# Patient Record
Sex: Female | Born: 1935 | Race: White | Hispanic: No | Smoking: Never smoker
Health system: Southern US, Community
[De-identification: ages and names within clinical notes are randomized; demographics above are authoritative.]

## PROBLEM LIST (undated history)

## (undated) DIAGNOSIS — M810 Age-related osteoporosis without current pathological fracture: Secondary | ICD-10-CM

## (undated) DIAGNOSIS — I10 Essential (primary) hypertension: Secondary | ICD-10-CM

## (undated) DIAGNOSIS — S82009A Unspecified fracture of unspecified patella, initial encounter for closed fracture: Secondary | ICD-10-CM

## (undated) DIAGNOSIS — I7300 Raynaud's syndrome without gangrene: Secondary | ICD-10-CM

## (undated) DIAGNOSIS — I714 Abdominal aortic aneurysm, without rupture, unspecified: Secondary | ICD-10-CM

## (undated) DIAGNOSIS — M1990 Unspecified osteoarthritis, unspecified site: Secondary | ICD-10-CM

## (undated) DIAGNOSIS — M419 Scoliosis, unspecified: Secondary | ICD-10-CM

## (undated) HISTORY — DX: Abdominal aortic aneurysm, without rupture: I71.4

## (undated) HISTORY — DX: Abdominal aortic aneurysm, without rupture, unspecified: I71.40

## (undated) HISTORY — PX: BREAST BIOPSY: SHX20

## (undated) HISTORY — PX: APPENDECTOMY: SHX54

---

## 2012-08-10 ENCOUNTER — Emergency Department (HOSPITAL_COMMUNITY): Payer: Medicare Other

## 2012-08-10 ENCOUNTER — Encounter (HOSPITAL_COMMUNITY): Payer: Self-pay | Admitting: Emergency Medicine

## 2012-08-10 ENCOUNTER — Emergency Department (HOSPITAL_COMMUNITY)
Admission: EM | Admit: 2012-08-10 | Discharge: 2012-08-10 | Disposition: A | Discharge status: Nursing Home (Includes ICF) | Payer: Medicare Other | Source: Home / Self Care

## 2012-08-10 ENCOUNTER — Emergency Department (HOSPITAL_COMMUNITY)
Admission: EM | Admit: 2012-08-10 | Discharge: 2012-08-10 | Disposition: A | Discharge status: Home / Self Care | Payer: Medicare Other | Attending: Emergency Medicine | Admitting: Emergency Medicine

## 2012-08-10 DIAGNOSIS — H532 Diplopia: Secondary | ICD-10-CM

## 2012-08-10 DIAGNOSIS — R42 Dizziness and giddiness: Secondary | ICD-10-CM

## 2012-08-10 DIAGNOSIS — R51 Headache: Secondary | ICD-10-CM

## 2012-08-10 DIAGNOSIS — H538 Other visual disturbances: Secondary | ICD-10-CM

## 2012-08-10 DIAGNOSIS — R519 Headache, unspecified: Secondary | ICD-10-CM

## 2012-08-10 DIAGNOSIS — R110 Nausea: Secondary | ICD-10-CM | POA: Insufficient documentation

## 2012-08-10 DIAGNOSIS — Z8739 Personal history of other diseases of the musculoskeletal system and connective tissue: Secondary | ICD-10-CM | POA: Insufficient documentation

## 2012-08-10 HISTORY — DX: Scoliosis, unspecified: M41.9

## 2012-08-10 HISTORY — DX: Raynaud's syndrome without gangrene: I73.00

## 2012-08-10 HISTORY — DX: Age-related osteoporosis without current pathological fracture: M81.0

## 2012-08-10 LAB — DIFFERENTIAL
Eosinophils Absolute: 0.1 10*3/uL (ref 0.0–0.7)
Lymphs Abs: 2.1 10*3/uL (ref 0.7–4.0)
Monocytes Absolute: 0.7 10*3/uL (ref 0.1–1.0)
Neutrophils Relative %: 54 % (ref 43–77)

## 2012-08-10 LAB — CBC
MCH: 32.9 pg (ref 26.0–34.0)
MCHC: 34.5 g/dL (ref 30.0–36.0)
MCV: 95.2 fL (ref 78.0–100.0)
Platelets: 234 10*3/uL (ref 150–400)
RDW: 14.1 % (ref 11.5–15.5)

## 2012-08-10 LAB — GLUCOSE, CAPILLARY: Glucose-Capillary: 59 mg/dL — ABNORMAL LOW (ref 70–99)

## 2012-08-10 LAB — COMPREHENSIVE METABOLIC PANEL
ALT: 13 U/L (ref 0–35)
AST: 24 U/L (ref 0–37)
CO2: 25 mEq/L (ref 19–32)
Calcium: 9.2 mg/dL (ref 8.4–10.5)
Creatinine, Ser: 0.62 mg/dL (ref 0.50–1.10)
GFR calc Af Amer: >90 mL/min (ref >90)
GFR calc non Af Amer: 86 mL/min — ABNORMAL LOW (ref >90)
Sodium: 136 mEq/L (ref 135–145)
Total Protein: 7.7 g/dL (ref 6.0–8.3)

## 2012-08-10 LAB — PROTIME-INR
INR: 1.02 (ref 0.00–1.49)
Prothrombin Time: 13.2 seconds (ref 11.6–15.2)

## 2012-08-10 LAB — POCT I-STAT TROPONIN I: Troponin i, poc: 0 ng/mL (ref 0.00–0.08)

## 2012-08-10 MED ORDER — ASPIRIN EC 325 MG PO TBEC: 325.0000 mg | DELAYED_RELEASE_TABLET | Freq: Every day | ORAL | Status: DC

## 2012-08-10 MED ORDER — ASPIRIN EC 81 MG PO TBEC
81.0000 mg | DELAYED_RELEASE_TABLET | Freq: Every day | ORAL | Status: DC
  Filled 2012-08-10: qty 1

## 2012-08-10 NOTE — ED Notes (Signed)
Pt c/o feeling dizzy about 30 minutes ago... Reports she had a HA, double vision, nauseas... Recalls she was shopping w/daughter and was going out to her car when suddenly sxs she started to feel dizzy... Also reports having colds sxs and every morning she wakes up w/clogged ears... States her sxs are clearing up.... Alert w/no signs of acute distress; daughter in the room w/pt.

## 2012-08-10 NOTE — ED Notes (Signed)
Pt c/o double vision and dizziness with nausea at 1300 which resolved; pt now c/o mild HA only; pt sent from [facility] for further eval

## 2012-08-10 NOTE — ED Provider Notes (Signed)
Medical screening examination/treatment/procedure(s) were performed by resident physician or non-physician practitioner and as supervising physician I was immediately available for consultation/collaboration.   Cheyrl Buley DOUGLAS MD.   Zofia Peckinpaugh D Chelci [place], MD 08/10/12 2019 

## 2012-08-10 NOTE — ED Provider Notes (Signed)
Patient had a vague headache today lasting approximately 20 minutes, we by diplopia and feeling of off balance. She is presently asymptomatic. Patient is alert appropriate Glasgow Coma Score 15 results from as well canners 2 through 12 grossly intact. Symptoms consistent with TIA. Patient does not wish 23 hour observation , and she reports her insurance will not pay adequately.. patient advised to take aspirin daily.   Doug Sou, MD 08/11/12 [phone]

## 2012-08-10 NOTE — ED Provider Notes (Signed)
CSN: [id number]     Arrival date & time 08/10/12  1451 History     First MD Initiated Contact with Patient 08/10/12 1617     Chief Complaint  Patient presents with  . Headache  . Dizziness   (Consider location/radiation/quality/duration/timing/severity/associated sxs/prior Treatment) Patient is a 77 y.o. female presenting with neurologic complaint. The history is provided by the patient and a relative.  Neurologic Problem This is a new problem. The current episode started today. The problem occurs rarely. The problem has been resolved. Associated symptoms include headaches and nausea. Pertinent negatives include no abdominal pain, chest pain, chills, congestion, coughing, diaphoresis, fatigue, fever, neck pain, numbness, rash, sore throat or vomiting. Nothing aggravates the symptoms. She has tried nothing for the symptoms. The treatment provided significant relief.    Past Medical History  Diagnosis Date  . Osteoporosis   . Scoliosis    Past Surgical History  Procedure Laterality Date  . Appendectomy      at age 77   History reviewed. No pertinent family history. History  Substance Use Topics  . Smoking status: Never Smoker   . Smokeless tobacco: Not on file  . Alcohol Use: No   OB History   Grav Para Term Preterm Abortions TAB SAB Ect Mult Living                 Review of Systems  Constitutional: Negative for fever, chills, diaphoresis and fatigue.  HENT: Negative for ear pain, congestion, sore throat, facial swelling, mouth sores, trouble swallowing, neck pain and neck stiffness.   Eyes: Positive for visual disturbance.  Respiratory: Negative for apnea, cough, chest tightness, shortness of breath and wheezing.   Cardiovascular: Negative for chest pain, palpitations and leg swelling.  Gastrointestinal: Positive for nausea. Negative for vomiting, abdominal pain, diarrhea and abdominal distention.  Genitourinary: Negative for hematuria, flank pain, vaginal discharge,  difficulty urinating and menstrual problem.  Musculoskeletal: Negative for back pain and gait problem.  Skin: Negative for rash and wound.  Neurological: Positive for headaches. Negative for dizziness, tremors, seizures, syncope, facial asymmetry and numbness.  Psychiatric/Behavioral: Negative.   All other systems reviewed and are negative.    Allergies  Review of patient's allergies indicates no known allergies.  Home Medications   Current Outpatient Rx  Name  Route  Sig  Dispense  Refill  . Multiple Vitamins-Minerals (MULTIVITAMIN WITH MINERALS) tablet   Oral   Take 1 tablet by mouth daily.          BP 142/79  Pulse 65  Temp(Src) 97.9 F (36.6 C) (Oral)  Resp 18  SpO2 100% Physical Exam  Nursing note and vitals reviewed. Constitutional: She is oriented to person, place, and time. She appears well-developed and well-nourished. No distress.  HENT:  Head: Normocephalic and atraumatic.  Right Ear: External ear normal.  Left Ear: External ear normal.  Nose: Nose normal.  Mouth/Throat: Oropharynx is clear and moist. No oropharyngeal exudate.  Eyes: Conjunctivae and EOM are normal. Pupils are equal, round, and reactive to light. Right eye exhibits no discharge. Left eye exhibits no discharge.  Neck: Normal range of motion. Neck supple. No JVD present. No tracheal deviation present. No thyromegaly present.  Cardiovascular: Normal rate, regular rhythm, normal heart sounds and intact distal pulses.  Exam reveals no gallop and no friction rub.   No murmur heard. Pulmonary/Chest: Effort normal and breath sounds normal. No respiratory distress. She has no wheezes. She has no rales. She exhibits no tenderness.  Abdominal: Soft. Bowel  sounds are normal. She exhibits no distension. There is no tenderness. There is no rebound and no guarding.  Musculoskeletal: Normal range of motion.  Lymphadenopathy:    She has no cervical adenopathy.  Neurological: She is alert and oriented to  person, place, and time. No cranial nerve deficit or sensory deficit. Coordination and gait normal. GCS eye subscore is 4. GCS verbal subscore is 5. GCS motor subscore is 6.  Normal finger to nose and heel to shin. NO ataxia with gait.  Skin: Skin is warm. No rash noted. She is not diaphoretic.  Psychiatric: She has a normal mood and affect. Her behavior is normal. Judgment and thought content normal.    ED Course   Procedures (including critical care time)  Labs Reviewed  COMPREHENSIVE METABOLIC PANEL - Abnormal; Notable for the following:    GFR calc non Af Amer 86 (*)    All other components within normal limits  GLUCOSE, CAPILLARY - Abnormal; Notable for the following:    Glucose-Capillary 59 (*)    All other components within normal limits  PROTIME-INR  APTT  CBC  DIFFERENTIAL  TROPONIN I  POCT I-STAT TROPONIN I   No results found. No diagnosis found.  MDM  78 yr old F here with sudden episode of dizziness, nausea and double vision. She describes the dizziness as lightheadedness. The episode lasted about 10 minutes and was followed by a mild 4/10 headache. The patient then went to Urgent care. She had an EKG which showed NSR with some t wave inversions laterally. She is now asymptomatic with a normal CN, motor, sensory, and cerebellar exam detailed above. She has no known medical problems and takes no medications. Concern for possible TIA. Her labs are unremarkable with a normal troponin which was ordered while patient in waiting room. Will MRI patient.  It was recommended that patient be admitted to the hospital for TIA workup after lengthy conversation. Patient declined this and preferred to have continued workup as an outpatient. Here her MRI is normal. Will recommend she follow up with her PCP for carotid US and cardiac [place]  Results for orders placed during the hospital encounter of 08/10/12  PROTIME-INR      Result Value Range   Prothrombin Time 13.2  11.6 - 15.2 seconds    INR 1.02  0.00 - 1.49  APTT      Result Value Range   aPTT 30  24 - 37 seconds  CBC      Result Value Range   WBC 6.6  4.0 - 10.5 K/uL   RBC 4.41  3.87 - 5.11 MIL/uL   Hemoglobin 14.5  12.0 - 15.0 g/dL   HCT 21.3  08.6 - 57.8 %   MCV 95.2  78.0 - 100.0 fL   MCH 32.9  26.0 - 34.0 pg   MCHC 34.5  30.0 - 36.0 g/dL   RDW 46.9  62.9 - 52.8 %   Platelets 234  150 - 400 K/uL  DIFFERENTIAL      Result Value Range   Neutrophils Relative % 54  43 - 77 %   Lymphocytes Relative 32  12 - 46 %   Monocytes Relative 11  3 - 12 %   Eosinophils Relative 2  0 - 5 %   Basophils Relative 1  0 - 1 %   Neutro Abs 3.6  1.7 - 7.7 K/uL   Lymphs Abs 2.1  0.7 - 4.0 K/uL   Monocytes Absolute 0.7  0.1 -  1.0 K/uL   Eosinophils Absolute 0.1  0.0 - 0.7 K/uL   Basophils Absolute 0.1  0.0 - 0.1 K/uL   Smear Review MORPHOLOGY UNREMARKABLE    COMPREHENSIVE METABOLIC PANEL      Result Value Range   Sodium 136  135 - 145 mEq/L   Potassium 4.4  3.5 - 5.1 mEq/L   Chloride 100  96 - 112 mEq/L   CO2 25  19 - 32 mEq/L   Glucose, Bld 93  70 - 99 mg/dL   BUN 18  6 - 23 mg/dL   Creatinine, Ser 1.61  0.50 - 1.10 mg/dL   Calcium 9.2  8.4 - 09.6 mg/dL   Total Protein 7.7  6.0 - 8.3 g/dL   Albumin 3.7  3.5 - 5.2 g/dL   AST 24  0 - 37 U/L   ALT 13  0 - 35 U/L   Alkaline Phosphatase 84  39 - 117 U/L   Total Bilirubin 1.1  0.3 - 1.2 mg/dL   GFR calc non Af Amer 86 (*) >90 mL/min   GFR calc Af Amer >90  >90 mL/min  TROPONIN I      Result Value Range   Troponin I <0.30  <0.30 ng/mL  GLUCOSE, CAPILLARY      Result Value Range   Glucose-Capillary 59 (*) 70 - 99 mg/dL  POCT I-STAT TROPONIN I      Result Value Range   Troponin i, poc 0.00  0.00 - 0.08 ng/mL   Comment 3             CT Head (Brain) Wo Contrast (Final result)  Result time: 08/10/12 17:04:02    Final result by Rad Results In Interface (08/10/12 17:04:02)    Narrative:   *RADIOLOGY REPORT*  Clinical Data: Dizziness, headache  CT HEAD WITHOUT  CONTRAST  Technique: Contiguous axial images were obtained from the base of the skull through the vertex without contrast.  Comparison: None.  Findings: No acute intracranial hemorrhage. No focal mass lesion. No CT evidence of acute infarction. No midline shift or mass effect. No hydrocephalus. Basilar cisterns are patent. Paranasal sinuses and mastoid air cells are clear. Orbits are normal.  IMPRESSION: No acute intracranial findings.   Original Report Authenticated By: Genevive Bi, M.D.     MR Brain Wo Contrast (Final result)  Result time: 08/10/12 19:15:32    Final result by Rad Results In Interface (08/10/12 19:15:32)    Narrative:   *RADIOLOGY REPORT*  Clinical Data: Possible TIA. Double vision and dizziness and nausea. Headache.  MRI HEAD WITHOUT CONTRAST  Technique: Multiplanar, multiecho pulse sequences of the brain and surrounding structures were obtained according to standard protocol without intravenous contrast.  Comparison: CT 08/10/2012  Findings: Negative for acute infarct.  Mild atrophy. Minimal chronic white matter changes are present bilaterally. Brainstem and cerebellum are normal.  Negative for hemorrhage or mass lesion. No shift of the midline structures. Paranasal sinuses are clear.  IMPRESSION: No acute abnormality.   Original Report Authenticated By: Janeece Riggers, M.D.      Case discussed with Dr. Ethelda Chick.  Sherryl Manges, MD 08/10/12 1924  Sherryl Manges, MD 08/10/12 [phone]

## 2012-08-10 NOTE — ED Provider Notes (Signed)
CSN: [id number]     Arrival date & time 08/10/12  1325 History     First MD Initiated Contact with Patient 08/10/12 1407     Chief Complaint  Patient presents with  . Dizziness   (Consider location/radiation/quality/duration/timing/severity/associated sxs/prior Treatment) HPI Comments:  77 year old female was walking to her car this afternoon and suddenly experienced double vision with  one object on top of the other, associated with dizziness, mild nausea without vomiting, headache and problems focusing. She says that some objects appeared foggy; the symptoms have ameliorated or  abated. She also states that her ears feel clogged and has PND. Her only medical history includes osteoporosis scoliosis. She reports that she is in generally good health in the last time she was sick was in 2003 with pneumonia. She is active and walks on almost a daily basis.   Past Medical History  Diagnosis Date  . Osteoporosis   . Scoliosis    Past Surgical History  Procedure Laterality Date  . Appendectomy      at age 88   No family history on file. History  Substance Use Topics  . Smoking status: Never Smoker   . Smokeless tobacco: Not on file  . Alcohol Use: No   OB History   Grav Para Term Preterm Abortions TAB SAB Ect Mult Living                 Review of Systems  Constitutional: Negative for fever, diaphoresis, activity change, appetite change and fatigue.  HENT: Positive for congestion and postnasal drip. Negative for ear pain, sore throat, facial swelling, rhinorrhea, trouble swallowing, neck pain, neck stiffness, sinus pressure and ear discharge.   Eyes: Positive for visual disturbance. Negative for pain and discharge.  Respiratory: Negative for cough, choking, chest tightness, shortness of breath and wheezing.   Cardiovascular: Negative for chest pain, palpitations and leg swelling.  Gastrointestinal: Positive for nausea. Negative for vomiting and abdominal pain.  Endocrine:  Negative.   Genitourinary: Negative.   Musculoskeletal: Negative.   Skin: Negative for rash.  Neurological: Positive for dizziness, weakness, light-headedness and headaches. Negative for tremors, seizures, syncope, facial asymmetry and speech difficulty.  Psychiatric/Behavioral: Negative for behavioral problems, confusion and decreased concentration. The patient is not nervous/anxious.     Allergies  Review of patient's allergies indicates no known allergies.  Home Medications  No current outpatient prescriptions on file. BP 127/75  Pulse 64  Temp(Src) 98.1 F (36.7 C) (Oral)  Resp 16  SpO2 100% Physical Exam  Nursing note and vitals reviewed. Constitutional: She is oriented to person, place, and time. She appears well-developed and well-nourished. No distress.  HENT:  Mouth/Throat: No oropharyngeal exudate.  Right TM with obstructing cerumen. Left TM clear with mild retraction. Oropharynx with minor erythema and cobblestoning.  Eyes: Conjunctivae and EOM are normal. Pupils are equal, round, and reactive to light.  Neck: Normal range of motion. Neck supple. No JVD present. No thyromegaly present.  No carotid bruits.  Cardiovascular: Normal rate, regular rhythm, normal heart sounds and intact distal pulses.   No murmur heard. Pulmonary/Chest: Effort normal and breath sounds normal. No respiratory distress. She has no wheezes. She has no rales.  Abdominal: Soft. There is no tenderness.  Musculoskeletal: She exhibits no edema and no tenderness.  Lymphadenopathy:    She has no cervical adenopathy.  Neurological: She is alert and oriented to person, place, and time. She has normal strength. She displays no tremor. No cranial nerve deficit or sensory deficit. She  exhibits normal muscle tone. She displays no seizure activity. Coordination and gait normal.  Reflex Scores:      Bicep reflexes are 2+ on the right side and 2+ on the left side.      Brachioradialis reflexes are 2+ on the  right side and 2+ on the left side.      Patellar reflexes are 2+ on the right side and 2+ on the left side. Romberg: Sways but maintained station Heel to toe: Larey Seat out of line twice Finger to nose is normal Negative pronator drift.   Skin: Skin is warm and dry. No rash noted. No erythema.  Psychiatric: She has a normal mood and affect.    ED Course   Procedures (including critical care time)  Labs Reviewed - No data to display No results found. 1. Double vision   2. Dizziness   3. Headache   4. Blurred vision, bilateral     MDM  This 77 year old female is been transferred via shuttle to the emergency department for further evaluation of this is incident in which she had double vision one object above the other, associated with dizziness, mild nausea, headache, and problems focusing.   Hayden Rasmussen, NP 08/10/12 1452

## 2012-08-10 NOTE — ED Notes (Signed)
Upon entering the room patient was found sitting in the chair off the monitor eating food that family brought to her. Patient is in no distress and no difficulty swallowing her food.

## 2012-08-11 NOTE — ED Provider Notes (Signed)
I have personally seen and examined the patient.  I have discussed the plan of care with the resident.  I have reviewed the documentation on PMH/FH/Soc. History.  I have reviewed the documentation of the resident and agree.  Doug Sou, MD 08/11/12 [phone]

## 2012-08-14 ENCOUNTER — Ambulatory Visit: Payer: Medicare Other | Admitting: Family

## 2014-03-18 ENCOUNTER — Encounter (HOSPITAL_COMMUNITY): Payer: Self-pay | Admitting: Emergency Medicine

## 2014-03-18 ENCOUNTER — Emergency Department (HOSPITAL_COMMUNITY)
Admission: EM | Admit: 2014-03-18 | Discharge: 2014-03-18 | Disposition: A | Discharge status: Home / Self Care | Payer: Medicare Other | Attending: Emergency Medicine | Admitting: Emergency Medicine

## 2014-03-18 DIAGNOSIS — B349 Viral infection, unspecified: Secondary | ICD-10-CM | POA: Insufficient documentation

## 2014-03-18 DIAGNOSIS — M419 Scoliosis, unspecified: Secondary | ICD-10-CM | POA: Insufficient documentation

## 2014-03-18 DIAGNOSIS — R197 Diarrhea, unspecified: Secondary | ICD-10-CM

## 2014-03-18 DIAGNOSIS — Z8679 Personal history of other diseases of the circulatory system: Secondary | ICD-10-CM | POA: Diagnosis not present

## 2014-03-18 DIAGNOSIS — Z3202 Encounter for pregnancy test, result negative: Secondary | ICD-10-CM | POA: Insufficient documentation

## 2014-03-18 LAB — URINALYSIS, ROUTINE W REFLEX MICROSCOPIC
BILIRUBIN URINE: NEGATIVE
Glucose, UA: NEGATIVE mg/dL
HGB URINE DIPSTICK: NEGATIVE
Ketones, ur: 15 mg/dL — AB
Nitrite: NEGATIVE
Protein, ur: NEGATIVE mg/dL
SPECIFIC GRAVITY, URINE: 1.009 (ref 1.005–1.030)
UROBILINOGEN UA: 0.2 mg/dL (ref 0.0–1.0)
pH: 5.5 (ref 5.0–8.0)

## 2014-03-18 LAB — COMPREHENSIVE METABOLIC PANEL
ALBUMIN: 3.3 g/dL — AB (ref 3.5–5.2)
ALK PHOS: 49 U/L (ref 39–117)
ALT: 21 U/L (ref 0–35)
ANION GAP: 10 (ref 5–15)
AST: 42 U/L — ABNORMAL HIGH (ref 0–37)
BUN: 14 mg/dL (ref 6–23)
CO2: 26 mmol/L (ref 19–32)
CREATININE: 0.9 mg/dL (ref 0.50–1.10)
Calcium: 7.8 mg/dL — ABNORMAL LOW (ref 8.4–10.5)
Chloride: 96 mmol/L (ref 96–112)
GFR, EST AFRICAN AMERICAN: 70 mL/min — AB (ref >90)
GFR, EST NON AFRICAN AMERICAN: 61 mL/min — AB (ref >90)
Glucose, Bld: 139 mg/dL — ABNORMAL HIGH (ref 70–99)
POTASSIUM: 3.2 mmol/L — AB (ref 3.5–5.1)
SODIUM: 132 mmol/L — AB (ref 135–145)
TOTAL PROTEIN: 6.7 g/dL (ref 6.0–8.3)
Total Bilirubin: 1.1 mg/dL (ref 0.3–1.2)

## 2014-03-18 LAB — CBC WITH DIFFERENTIAL/PLATELET
BASOS ABS: 0 10*3/uL (ref 0.0–0.1)
BASOS PCT: 0 % (ref 0–1)
EOS ABS: 0 10*3/uL (ref 0.0–0.7)
Eosinophils Relative: 0 % (ref 0–5)
HCT: 40.4 % (ref 36.0–46.0)
Hemoglobin: 13.2 g/dL (ref 12.0–15.0)
LYMPHS PCT: 15 % (ref 12–46)
Lymphs Abs: 1.1 10*3/uL (ref 0.7–4.0)
MCH: 30.1 pg (ref 26.0–34.0)
MCHC: 32.7 g/dL (ref 30.0–36.0)
MCV: 92.2 fL (ref 78.0–100.0)
MONO ABS: 0.6 10*3/uL (ref 0.1–1.0)
Monocytes Relative: 9 % (ref 3–12)
NEUTROS ABS: 5.5 10*3/uL (ref 1.7–7.7)
Neutrophils Relative %: 76 % (ref 43–77)
PLATELETS: 149 10*3/uL — AB (ref 150–400)
RBC: 4.38 MIL/uL (ref 3.87–5.11)
RDW: 13.8 % (ref 11.5–15.5)
WBC: 7.2 10*3/uL (ref 4.0–10.5)

## 2014-03-18 LAB — POC URINE PREG, ED: PREG TEST UR: NEGATIVE

## 2014-03-18 LAB — URINE MICROSCOPIC-ADD ON

## 2014-03-18 MED ORDER — POTASSIUM CHLORIDE CRYS ER 20 MEQ PO TBCR
40.0000 meq | EXTENDED_RELEASE_TABLET | Freq: Once | ORAL | Status: DC
  Filled 2014-03-18: qty 2

## 2014-03-18 MED ORDER — POTASSIUM CHLORIDE 20 MEQ/15ML (10%) PO SOLN
40.0000 meq | Freq: Once | ORAL | Status: AC
  Administered 2014-03-18: 40 meq via ORAL
  Filled 2014-03-18: qty 30

## 2014-03-18 MED ORDER — SODIUM CHLORIDE 0.9 % IV BOLUS (SEPSIS)
500.0000 mL | Freq: Once | INTRAVENOUS | Status: AC
  Administered 2014-03-18: 500 mL via INTRAVENOUS

## 2014-03-18 NOTE — ED Provider Notes (Signed)
CSN: [id number]     Arrival date & time 03/18/14  1511 History   First MD Initiated Contact with Patient 03/18/14 1638     Chief Complaint  Patient presents with  . Diarrhea     (Consider location/radiation/quality/duration/timing/severity/associated sxs/prior Treatment) HPI  Pt presents with c/o body aches, fever as well as nausea and diarrhea.  She states symptoms began approx 6 days ago.  No vomiting.  No abdominal pain.  Diarrhea is loose but has been decreasing for most of the day today, until she had another episode of loose stool prior to coming to the ED.  She has had decreased po intake due to concern that this will cause more diarrhea.  No urinary symptoms.  No difficulty breathing but did have cough and body aches at the beginning of her illness.  No sick contacts.  No recent travel.  There are no other associated systemic symptoms, there are no other alleviating or modifying factors.   Past Medical History  Diagnosis Date  . Osteoporosis   . Scoliosis   . Raynaud's disease    Past Surgical History  Procedure Laterality Date  . Appendectomy      at age 57   No family history on file. History  Substance Use Topics  . Smoking status: Never Smoker   . Smokeless tobacco: Not on file  . Alcohol Use: No   OB History    No data available     Review of Systems  ROS reviewed and all otherwise negative except for mentioned in HPI    Allergies  Review of patient's allergies indicates no known allergies.  Home Medications   Prior to Admission medications   Not on File   BP 124/45 mmHg  Pulse 66  Temp(Src) 97.9 F (36.6 C)  Resp 16  SpO2 93%  Vitals reviewed Physical Exam  Physical Examination: General appearance - alert, well appearing, and in no distress Mental status - alert, oriented to person, place, and time Eyes - no conjunctival injection, no scleral icterus Mouth - mucous membranes moist, pharynx normal without lesions Chest - clear to auscultation,  no wheezes, rales or rhonchi, symmetric air entry Heart - normal rate, regular rhythm, normal S1, S2, no murmurs, rubs, clicks or gallops Abdomen - soft, nontender, nondistended, no masses or organomegaly, nabs Extremities - peripheral pulses normal, no pedal edema, no clubbing or cyanosis Skin - normal coloration and turgor, no rashes  ED Course  Procedures (including critical care time) Labs Review Labs Reviewed  CBC WITH DIFFERENTIAL/PLATELET - Abnormal; Notable for the following:    Platelets 149 (*)    All other components within normal limits  COMPREHENSIVE METABOLIC PANEL - Abnormal; Notable for the following:    Sodium 132 (*)    Potassium 3.2 (*)    Glucose, Bld 139 (*)    Calcium 7.8 (*)    Albumin 3.3 (*)    AST 42 (*)    GFR calc non Af Amer 61 (*)    GFR calc Af Amer 70 (*)    All other components within normal limits  URINALYSIS, ROUTINE W REFLEX MICROSCOPIC - Abnormal; Notable for the following:    Ketones, ur 15 (*)    Leukocytes, UA TRACE (*)    All other components within normal limits  URINE MICROSCOPIC-ADD ON - Abnormal; Notable for the following:    Squamous Epithelial / LPF FEW (*)    Bacteria, UA FEW (*)    Casts HYALINE CASTS (*)  All other components within normal limits  CLOSTRIDIUM DIFFICILE BY PCR  STOOL CULTURE  STOOL CULTURE  CLOSTRIDIUM DIFFICILE BY PCR  POC URINE PREG, ED    Imaging Review No results found.   EKG Interpretation None      MDM   Final diagnoses:  Diarrhea  Viral illness    Pt presenting with c/o 6 days of diarrhea- intiially she had fever/body aches and cough. Suspect most likely viral illness. No abdominal tenderness.  Urine is reassuring.  Pt received IV fluids and potassium replacement.  She was not able to give a stool sample to check for cdif.  Sent home with specimen container.  Discharged with strict return precautions.  Pt agreeable with plan.    Jerelyn ScottMartha Linker, MD 03/19/14 [phone]

## 2014-03-18 NOTE — ED Notes (Signed)
Pt reports diarrhea x 6 days. sts she has been unable to tolerate PO. Denies abdominal pain or vomiting.

## 2014-03-18 NOTE — Discharge Instructions (Signed)
Return to the ED with any concerns including vomiting and not able to keep down liquids, abdominal pain, fever/chills, blood in stool, decreased level of alertness/lethargy, or any other alarming symptoms

## 2014-03-19 ENCOUNTER — Other Ambulatory Visit (HOSPITAL_COMMUNITY)
Admission: AD | Admit: 2014-03-19 | Discharge: 2014-03-19 | Disposition: A | Discharge status: Home / Self Care | Payer: Medicare Other | Source: Ambulatory Visit | Attending: Emergency Medicine | Admitting: Emergency Medicine

## 2014-03-30 ENCOUNTER — Emergency Department (HOSPITAL_COMMUNITY): Payer: Medicare Other

## 2014-03-30 ENCOUNTER — Encounter (HOSPITAL_COMMUNITY): Payer: Self-pay | Admitting: Emergency Medicine

## 2014-03-30 ENCOUNTER — Inpatient Hospital Stay (HOSPITAL_COMMUNITY)
Admission: EM | Admit: 2014-03-30 | Discharge: 2014-04-13 | DRG: 219 | Disposition: A | Discharge status: Home / Self Care | Payer: Medicare Other | Attending: Surgery | Admitting: Surgery

## 2014-03-30 DIAGNOSIS — I7100 Dissection of unspecified site of aorta: Secondary | ICD-10-CM | POA: Diagnosis not present

## 2014-03-30 DIAGNOSIS — R05 Cough: Secondary | ICD-10-CM

## 2014-03-30 DIAGNOSIS — J189 Pneumonia, unspecified organism: Secondary | ICD-10-CM | POA: Diagnosis present

## 2014-03-30 DIAGNOSIS — J90 Pleural effusion, not elsewhere classified: Secondary | ICD-10-CM

## 2014-03-30 DIAGNOSIS — R402252 Coma scale, best verbal response, oriented, at arrival to emergency department: Secondary | ICD-10-CM | POA: Diagnosis present

## 2014-03-30 DIAGNOSIS — I712 Thoracic aortic aneurysm, without rupture, unspecified: Secondary | ICD-10-CM | POA: Diagnosis present

## 2014-03-30 DIAGNOSIS — R059 Cough, unspecified: Secondary | ICD-10-CM

## 2014-03-30 DIAGNOSIS — M419 Scoliosis, unspecified: Secondary | ICD-10-CM | POA: Diagnosis present

## 2014-03-30 DIAGNOSIS — I7300 Raynaud's syndrome without gangrene: Secondary | ICD-10-CM | POA: Diagnosis present

## 2014-03-30 DIAGNOSIS — N390 Urinary tract infection, site not specified: Secondary | ICD-10-CM | POA: Diagnosis present

## 2014-03-30 DIAGNOSIS — E876 Hypokalemia: Secondary | ICD-10-CM | POA: Diagnosis not present

## 2014-03-30 DIAGNOSIS — I7101 Dissection of thoracic aorta: Secondary | ICD-10-CM | POA: Diagnosis not present

## 2014-03-30 DIAGNOSIS — D62 Acute posthemorrhagic anemia: Secondary | ICD-10-CM | POA: Diagnosis not present

## 2014-03-30 DIAGNOSIS — E440 Moderate protein-calorie malnutrition: Secondary | ICD-10-CM | POA: Insufficient documentation

## 2014-03-30 DIAGNOSIS — R0600 Dyspnea, unspecified: Secondary | ICD-10-CM

## 2014-03-30 DIAGNOSIS — I82431 Acute embolism and thrombosis of right popliteal vein: Secondary | ICD-10-CM | POA: Diagnosis present

## 2014-03-30 DIAGNOSIS — I2510 Atherosclerotic heart disease of native coronary artery without angina pectoris: Secondary | ICD-10-CM | POA: Diagnosis present

## 2014-03-30 DIAGNOSIS — R402362 Coma scale, best motor response, obeys commands, at arrival to emergency department: Secondary | ICD-10-CM | POA: Diagnosis present

## 2014-03-30 DIAGNOSIS — Z9689 Presence of other specified functional implants: Secondary | ICD-10-CM

## 2014-03-30 DIAGNOSIS — J209 Acute bronchitis, unspecified: Secondary | ICD-10-CM | POA: Diagnosis present

## 2014-03-30 DIAGNOSIS — I959 Hypotension, unspecified: Secondary | ICD-10-CM | POA: Diagnosis not present

## 2014-03-30 DIAGNOSIS — I10 Essential (primary) hypertension: Secondary | ICD-10-CM | POA: Diagnosis present

## 2014-03-30 DIAGNOSIS — M810 Age-related osteoporosis without current pathological fracture: Secondary | ICD-10-CM | POA: Diagnosis present

## 2014-03-30 DIAGNOSIS — Z86718 Personal history of other venous thrombosis and embolism: Secondary | ICD-10-CM | POA: Diagnosis not present

## 2014-03-30 DIAGNOSIS — I2699 Other pulmonary embolism without acute cor pulmonale: Secondary | ICD-10-CM | POA: Diagnosis present

## 2014-03-30 DIAGNOSIS — I71012 Dissection of descending thoracic aorta: Secondary | ICD-10-CM | POA: Diagnosis present

## 2014-03-30 DIAGNOSIS — R402142 Coma scale, eyes open, spontaneous, at arrival to emergency department: Secondary | ICD-10-CM | POA: Diagnosis present

## 2014-03-30 LAB — URINE MICROSCOPIC-ADD ON

## 2014-03-30 LAB — URINALYSIS, ROUTINE W REFLEX MICROSCOPIC
Bilirubin Urine: NEGATIVE
GLUCOSE, UA: NEGATIVE mg/dL
Ketones, ur: NEGATIVE mg/dL
Nitrite: POSITIVE — AB
PH: 5 (ref 5.0–8.0)
Protein, ur: 30 mg/dL — AB
Specific Gravity, Urine: 1.023 (ref 1.005–1.030)
UROBILINOGEN UA: 0.2 mg/dL (ref 0.0–1.0)

## 2014-03-30 LAB — INFLUENZA PANEL BY PCR (TYPE A & B)
H1N1 flu by pcr: NOT DETECTED
INFLAPCR: NEGATIVE
INFLBPCR: NEGATIVE

## 2014-03-30 LAB — I-STAT CG4 LACTIC ACID, ED: Lactic Acid, Venous: 1.6 mmol/L (ref 0.5–2.0)

## 2014-03-30 LAB — CBC
HCT: 35.5 % — ABNORMAL LOW (ref 36.0–46.0)
HEMOGLOBIN: 11.3 g/dL — AB (ref 12.0–15.0)
MCH: 30.2 pg (ref 26.0–34.0)
MCHC: 31.8 g/dL (ref 30.0–36.0)
MCV: 94.9 fL (ref 78.0–100.0)
Platelets: 315 10*3/uL (ref 150–400)
RBC: 3.74 MIL/uL — AB (ref 3.87–5.11)
RDW: 14 % (ref 11.5–15.5)
WBC: 12.9 10*3/uL — ABNORMAL HIGH (ref 4.0–10.5)

## 2014-03-30 LAB — HEPATIC FUNCTION PANEL
ALBUMIN: 2.9 g/dL — AB (ref 3.5–5.2)
ALT: 27 U/L (ref 0–35)
AST: 38 U/L — ABNORMAL HIGH (ref 0–37)
Alkaline Phosphatase: 64 U/L (ref 39–117)
BILIRUBIN DIRECT: 0.2 mg/dL (ref 0.0–0.5)
Indirect Bilirubin: 0.6 mg/dL (ref 0.3–0.9)
TOTAL PROTEIN: 7.2 g/dL (ref 6.0–8.3)
Total Bilirubin: 0.8 mg/dL (ref 0.3–1.2)

## 2014-03-30 LAB — BASIC METABOLIC PANEL
Anion gap: 7 (ref 5–15)
BUN: 15 mg/dL (ref 6–23)
CALCIUM: 8.5 mg/dL (ref 8.4–10.5)
CO2: 28 mmol/L (ref 19–32)
Chloride: 104 mmol/L (ref 96–112)
Creatinine, Ser: 0.64 mg/dL (ref 0.50–1.10)
GFR calc Af Amer: >90 mL/min (ref >90)
GFR calc non Af Amer: 85 mL/min — ABNORMAL LOW (ref >90)
Glucose, Bld: 140 mg/dL — ABNORMAL HIGH (ref 70–99)
POTASSIUM: 3.5 mmol/L (ref 3.5–5.1)
Sodium: 139 mmol/L (ref 135–145)

## 2014-03-30 LAB — LACTIC ACID, PLASMA: Lactic Acid, Venous: 1.3 mmol/L (ref 0.5–2.0)

## 2014-03-30 LAB — LIPASE, BLOOD: LIPASE: 26 U/L (ref 11–59)

## 2014-03-30 LAB — MRSA PCR SCREENING: MRSA by PCR: NEGATIVE

## 2014-03-30 LAB — BRAIN NATRIURETIC PEPTIDE: B Natriuretic Peptide: 68.2 pg/mL (ref 0.0–100.0)

## 2014-03-30 LAB — I-STAT TROPONIN, ED
TROPONIN I, POC: 0 ng/mL (ref 0.00–0.08)
TROPONIN I, POC: 0 ng/mL (ref 0.00–0.08)

## 2014-03-30 LAB — PROCALCITONIN: Procalcitonin: <0.1 ng/mL

## 2014-03-30 MED ORDER — MORPHINE SULFATE 2 MG/ML IJ SOLN
2.0000 mg | Freq: Once | INTRAMUSCULAR | Status: AC
  Administered 2014-03-30: 2 mg via INTRAVENOUS
  Filled 2014-03-30: qty 1

## 2014-03-30 MED ORDER — DEXTROSE 5 % IV SOLN
500.0000 mg | Freq: Once | INTRAVENOUS | Status: AC
  Administered 2014-03-30: 500 mg via INTRAVENOUS
  Filled 2014-03-30: qty 500

## 2014-03-30 MED ORDER — HYDRALAZINE HCL 20 MG/ML IJ SOLN
5.0000 mg | INTRAMUSCULAR | Status: DC | PRN
  Administered 2014-03-30 – 2014-03-31 (×2): 5 mg via INTRAVENOUS
  Filled 2014-03-30 (×2): qty 1

## 2014-03-30 MED ORDER — SODIUM CHLORIDE 0.9 % IV BOLUS (SEPSIS)
1000.0000 mL | Freq: Once | INTRAVENOUS | Status: AC
  Administered 2014-03-30: 1000 mL via INTRAVENOUS

## 2014-03-30 MED ORDER — CETYLPYRIDINIUM CHLORIDE 0.05 % MT LIQD
7.0000 mL | Freq: Two times a day (BID) | OROMUCOSAL | Status: DC
  Administered 2014-03-30 – 2014-04-04 (×6): 7 mL via OROMUCOSAL

## 2014-03-30 MED ORDER — SODIUM CHLORIDE 0.9 % IV SOLN
INTRAVENOUS | Status: DC
  Administered 2014-03-30 (×2): via INTRAVENOUS
  Administered 2014-03-31: 100 mL/h via INTRAVENOUS
  Administered 2014-04-03: 01:00:00 via INTRAVENOUS

## 2014-03-30 MED ORDER — DEXTROSE 5 % IV SOLN
1.0000 g | Freq: Once | INTRAVENOUS | Status: AC
  Administered 2014-03-30: 1 g via INTRAVENOUS
  Filled 2014-03-30: qty 10

## 2014-03-30 MED ORDER — MORPHINE SULFATE 4 MG/ML IJ SOLN
4.0000 mg | Freq: Once | INTRAMUSCULAR | Status: AC
  Administered 2014-03-30: 4 mg via INTRAVENOUS
  Filled 2014-03-30: qty 1

## 2014-03-30 MED ORDER — FENTANYL CITRATE 0.05 MG/ML IJ SOLN: 100.0000 ug | Freq: Once | INTRAMUSCULAR | Status: DC

## 2014-03-30 MED ORDER — IPRATROPIUM-ALBUTEROL 0.5-2.5 (3) MG/3ML IN SOLN
3.0000 mL | Freq: Once | RESPIRATORY_TRACT | Status: AC
  Administered 2014-03-30: 3 mL via RESPIRATORY_TRACT
  Filled 2014-03-30: qty 3

## 2014-03-30 MED ORDER — DEXTROSE 5 % IV SOLN
1.0000 g | INTRAVENOUS | Status: DC
  Administered 2014-03-31 – 2014-04-03 (×4): 1 g via INTRAVENOUS
  Filled 2014-03-30 (×6): qty 10

## 2014-03-30 MED ORDER — MORPHINE SULFATE 2 MG/ML IJ SOLN
1.0000 mg | INTRAMUSCULAR | Status: DC | PRN
  Administered 2014-03-30 – 2014-04-01 (×8): 2 mg via INTRAVENOUS
  Filled 2014-03-30 (×9): qty 1

## 2014-03-30 MED ORDER — ONDANSETRON HCL 4 MG/2ML IJ SOLN
4.0000 mg | Freq: Once | INTRAMUSCULAR | Status: AC
  Administered 2014-03-30: 4 mg via INTRAVENOUS
  Filled 2014-03-30: qty 2

## 2014-03-30 MED ORDER — ONDANSETRON HCL 4 MG/2ML IJ SOLN
4.0000 mg | Freq: Four times a day (QID) | INTRAMUSCULAR | Status: DC | PRN
  Administered 2014-03-30 – 2014-04-04 (×12): 4 mg via INTRAVENOUS
  Filled 2014-03-30 (×12): qty 2

## 2014-03-30 MED ORDER — FENTANYL CITRATE 0.05 MG/ML IJ SOLN
50.0000 ug | Freq: Once | INTRAMUSCULAR | Status: AC
  Administered 2014-03-30: 50 ug via INTRAVENOUS
  Filled 2014-03-30: qty 2

## 2014-03-30 MED ORDER — PANTOPRAZOLE SODIUM 40 MG PO TBEC
40.0000 mg | DELAYED_RELEASE_TABLET | Freq: Every day | ORAL | Status: DC
Start: 2014-03-30 — End: 2014-04-04
  Administered 2014-03-30 – 2014-04-03 (×4): 40 mg via ORAL
  Filled 2014-03-30 (×5): qty 1

## 2014-03-30 MED ORDER — ACETAMINOPHEN 325 MG PO TABS: 650.0000 mg | ORAL_TABLET | ORAL | Status: DC | PRN

## 2014-03-30 MED ORDER — SODIUM CHLORIDE 0.9 % IV SOLN: 250.0000 mL | INTRAVENOUS | Status: DC | PRN

## 2014-03-30 NOTE — ED Notes (Signed)
C/o generalized back pain x 1 week.  Reports bilateral jaw pain that started during the night.  Denies nausea, vomiting.  Reports difficulty taking a deep breath.

## 2014-03-30 NOTE — ED Provider Notes (Signed)
CSN: [id number]     Arrival date & time 03/30/14  [id number] History   First MD Initiated Contact with Patient 03/30/14 0801     Chief Complaint  Patient presents with  . Back Pain  . Jaw Pain     (Consider location/radiation/quality/duration/timing/severity/associated sxs/prior Treatment) HPI The patient has been ill for approximately 3 weeks. At onset she had a diarrheal illness with coughing associated. At that time she had had fever with a MAXIMUM TEMPERATURE that remained under 101. The diarrhea had been copious and watery. She had been seen in the emergency department and diagnosed with a viral illness. The patient began taking Kaopectate and the diarrheal frequency improved significantly. Her cough has improved somewhat although she continues to have wet frequent cough and has shortness of breath and pain with deep inspiration. The patient reports pain has increased significantly with extensive lower back pain as well as generalized aching. She reports she feels extremely fatigued and ill. She has not developed vomiting and has tried to keep up with fluid intake by drinking water. Past Medical History  Diagnosis Date  . Osteoporosis   . Scoliosis   . Raynaud's disease    Past Surgical History  Procedure Laterality Date  . Appendectomy      at age 79   No family history on file. History  Substance Use Topics  . Smoking status: Never Smoker   . Smokeless tobacco: Not on file  . Alcohol Use: No   OB History    No data available     Review of Systems  10 Systems reviewed and are negative for acute change except as noted in the HPI.   Allergies  Review of patient's allergies indicates no known allergies.  Home Medications   Prior to Admission medications   Medication Sig Start Date End Date Taking? Authorizing Provider  acetaminophen (TYLENOL) 325 MG tablet Take 650 mg by mouth every 6 (six) hours as needed.   Yes Historical Provider, MD   BP 101/80 mmHg  Pulse 72   Temp(Src) 97.5 F (36.4 C) (Oral)  Resp 24  Ht 5\' 2"  (1.575 m)  Wt 121 lb (54.885 kg)  BMI 22.13 kg/m2  SpO2 96% Physical Exam  Constitutional: She is oriented to person, place, and time.  The patient is a well-nourished well-developed female who is ill in appearance and appears to be in general pain. She does not have respiratory distress. Her mental status is appropriate in giving history.  HENT:  Head: Normocephalic and atraumatic.  Right Ear: External ear normal.  Left Ear: External ear normal.  Nose: Nose normal.  Mucous membranes are slightly dry. Posterior oropharynx is widely patent.  Eyes: EOM are normal. Pupils are equal, round, and reactive to light. Right eye exhibits no discharge. Left eye exhibits no discharge.  Neck: Neck supple.  Cardiovascular: Normal rate, regular rhythm, normal heart sounds and intact distal pulses.   Pulmonary/Chest: Effort normal.  The patient has frequent wet sounding cough. There is decreased air flow to the bases. With deeper inspiration the patient develops coughing.  Abdominal: Soft. She exhibits no distension. There is tenderness.  Abdomen is diffusely tender without guarding. She cannot localize pain.  Musculoskeletal: Normal range of motion. She exhibits no edema.  Patient endorses significant pain to palpation at the 8 level of the sacrum and lower back without localizing to the vertebral bodies.  Neurological: She is alert and oriented to person, place, and time. She has normal strength. No cranial nerve deficit.  She exhibits normal muscle tone. Coordination normal. GCS eye subscore is 4. GCS verbal subscore is 5. GCS motor subscore is 6.  Skin: Skin is warm, dry and intact. No rash noted.  Psychiatric:  The patient is appropriate but ill in appearance.    ED Course  Procedures (including critical care time) Labs Review Labs Reviewed  CBC - Abnormal; Notable for the following:    WBC 12.9 (*)    RBC 3.74 (*)    Hemoglobin 11.3 (*)     HCT 35.5 (*)    All other components within normal limits  BASIC METABOLIC PANEL - Abnormal; Notable for the following:    Glucose, Bld 140 (*)    GFR calc non Af Amer 85 (*)    All other components within normal limits  HEPATIC FUNCTION PANEL - Abnormal; Notable for the following:    Albumin 2.9 (*)    AST 38 (*)    All other components within normal limits  URINALYSIS, ROUTINE W REFLEX MICROSCOPIC - Abnormal; Notable for the following:    Color, Urine AMBER (*)    APPearance TURBID (*)    Hgb urine dipstick MODERATE (*)    Protein, ur 30 (*)    Nitrite POSITIVE (*)    Leukocytes, UA LARGE (*)    All other components within normal limits  URINE MICROSCOPIC-ADD ON - Abnormal; Notable for the following:    Squamous Epithelial / LPF FEW (*)    Bacteria, UA MANY (*)    All other components within normal limits  CULTURE, BLOOD (ROUTINE X 2)  CULTURE, BLOOD (ROUTINE X 2)  BRAIN NATRIURETIC PEPTIDE  LIPASE, BLOOD  INFLUENZA PANEL BY PCR (TYPE A & B, H1N1)  I-STAT TROPOININ, ED  I-STAT CG4 LACTIC ACID, ED  I-STAT TROPOININ, ED    Imaging Review Ct Abdomen Pelvis Wo Contrast  03/30/2014   CLINICAL DATA:  Back pain and shortness of Breath  EXAM: CT CHEST, ABDOMEN AND PELVIS WITHOUT CONTRAST  TECHNIQUE: Multidetector CT imaging of the chest, abdomen and pelvis was performed following the standard protocol without IV contrast. This study was originally ordered with contrast although the patient adamantly refuses to be administered the contrast  COMPARISON:  None.  FINDINGS: CT CHEST FINDINGS  The lungs are well aerated bilaterally. No focal infiltrate or sizable effusion is seen. Mild fibrotic changes are noted in the bases bilaterally. No parenchymal nodules are seen. Thoracic inlet demonstrates the right internal jugular vein to be enlarged of uncertain significance. Calcifications of the thoracic aorta in its branches are noted. Additionally there are changes consistent with dissection  of the descending thoracic aorta although the chronicity of this is uncertain given the lack of contrast. This appears to extend to the level of the aortic hiatus. Some increased attenuation is noted within the false lumen which may be related to acute changes. Given the patient's clinical history of acute back pain this must be viewed with some suspicion for an acute dissection.  No hilar or mediastinal adenopathy is seen. Coronary calcifications are noted. The osseous structures of the chest show degenerative change of the thoracic spine. No other focal abnormality is noted.  CT ABDOMEN AND PELVIS FINDINGS  The liver, gallbladder, spleen, adrenal glands and pancreas are within normal limits. The kidneys are well visualized without renal calculi or obstructive changes. Some scarring is noted in the midportion of the left kidney.  Diffuse aortoiliac calcifications are noted. Mild aneurysmal dilatation of the aorta is seen to 3.1 cm.  Ectasia of the common iliac arteries is noted bilaterally. There are no findings to suggest dissection within the abdominal aorta. Scattered diverticular change of the colon is seen. The appendix is not well visualized consistent with the surgical history. The bladder is partially distended. No pelvic mass lesion is seen. No significant lymphadenopathy is noted. The osseous structures of the abdomen demonstrate a scoliosis concave to the left of the lumbar spine. Mild degenerative changes are seen. No acute bony abnormality is noted.  IMPRESSION: CT of the chest: Changes consistent with descending thoracic aortic dissection of uncertain chronicity. Increased attenuation is noted within the false lumen which may represent some acute change. Given the patient's clinical history this must be viewed as an acute to subacute dissection. The patient should be encouraged to undergo contrast-enhanced CTA.  Bibasilar fibrotic changes.  CT of the abdomen and pelvis:  No acute abnormality is noted.   Mild aneurysmal dilatation of the abdominal aorta.  Critical Value/emergent results were called by telephone at the time of interpretation on 03/30/2014 at 10:46 am to Dr. Arby Barrette , who verbally acknowledged these results.   Electronically Signed   By: Alcide Clever M.D.   On: 03/30/2014 10:48   Dg Chest 2 View  03/30/2014   CLINICAL DATA:  Dyspnea with cough for 2 weeks.  EXAM: CHEST  2 VIEW  COMPARISON:  None.  FINDINGS: There is hyperinflation. There is moderate cardiomegaly and aortic tortuosity. Basilar airspace opacities are present bilaterally without confluent consolidation. This could be infectious or atelectatic. No effusions are evident.  IMPRESSION: Hyperinflation with mild basilar airspace opacities, infectious versus atelectatic.   Electronically Signed   By: Ellery Plunk M.D.   On: 03/30/2014 07:02   Ct Chest Wo Contrast  03/30/2014   CLINICAL DATA:  Back pain and shortness of Breath  EXAM: CT CHEST, ABDOMEN AND PELVIS WITHOUT CONTRAST  TECHNIQUE: Multidetector CT imaging of the chest, abdomen and pelvis was performed following the standard protocol without IV contrast. This study was originally ordered with contrast although the patient adamantly refuses to be administered the contrast  COMPARISON:  None.  FINDINGS: CT CHEST FINDINGS  The lungs are well aerated bilaterally. No focal infiltrate or sizable effusion is seen. Mild fibrotic changes are noted in the bases bilaterally. No parenchymal nodules are seen. Thoracic inlet demonstrates the right internal jugular vein to be enlarged of uncertain significance. Calcifications of the thoracic aorta in its branches are noted. Additionally there are changes consistent with dissection of the descending thoracic aorta although the chronicity of this is uncertain given the lack of contrast. This appears to extend to the level of the aortic hiatus. Some increased attenuation is noted within the false lumen which may be related to acute  changes. Given the patient's clinical history of acute back pain this must be viewed with some suspicion for an acute dissection.  No hilar or mediastinal adenopathy is seen. Coronary calcifications are noted. The osseous structures of the chest show degenerative change of the thoracic spine. No other focal abnormality is noted.  CT ABDOMEN AND PELVIS FINDINGS  The liver, gallbladder, spleen, adrenal glands and pancreas are within normal limits. The kidneys are well visualized without renal calculi or obstructive changes. Some scarring is noted in the midportion of the left kidney.  Diffuse aortoiliac calcifications are noted. Mild aneurysmal dilatation of the aorta is seen to 3.1 cm. Ectasia of the common iliac arteries is noted bilaterally. There are no findings to suggest dissection within the  abdominal aorta. Scattered diverticular change of the colon is seen. The appendix is not well visualized consistent with the surgical history. The bladder is partially distended. No pelvic mass lesion is seen. No significant lymphadenopathy is noted. The osseous structures of the abdomen demonstrate a scoliosis concave to the left of the lumbar spine. Mild degenerative changes are seen. No acute bony abnormality is noted.  IMPRESSION: CT of the chest: Changes consistent with descending thoracic aortic dissection of uncertain chronicity. Increased attenuation is noted within the false lumen which may represent some acute change. Given the patient's clinical history this must be viewed as an acute to subacute dissection. The patient should be encouraged to undergo contrast-enhanced CTA.  Bibasilar fibrotic changes.  CT of the abdomen and pelvis:  No acute abnormality is noted.  Mild aneurysmal dilatation of the abdominal aorta.  Critical Value/emergent results were called by telephone at the time of interpretation on 03/30/2014 at 10:46 am to Dr. Arby Barrette , who verbally acknowledged these results.   Electronically Signed    By: Alcide Clever M.D.   On: 03/30/2014 10:48     EKG Interpretation   Date/Time:  Saturday March 30 2014 06:31:00 EDT Ventricular Rate:  67 PR Interval:  136 QRS Duration: 88 QT Interval:  440 QTC Calculation: 464 R Axis:   -50 Text Interpretation:  Normal sinus rhythm Left axis deviation Pulmonary  disease pattern ST \\T \ T wave abnormality, consider inferior ischemia ST  \T\ T wave abnormality, consider anterolateral ischemia Abnormal ECG T  wave inversions deeper than on prior ekg 07/2012 Confirmed by OTTER  MD,  OLGA ([id number]) on 03/30/2014 6:44:49 AM     Recheck 08 50 pain was slightly improved by fentanyl. Consult: Dr. Laneta Simmers of cardiothoracic surgery has advised that this is a nonoperative dissection and the patient is to be admitted to the intensivist. Consult: Intensivist consulted for admission and ongoing treatment. CRITICAL CARE Performed by: Arby Barrette   Total critical care time: 90 minutes  Critical care time was exclusive of separately billable procedures and treating other patients.  Critical care was necessary to treat or prevent imminent or life-threatening deterioration.  Critical care was time spent personally by me on the following activities: development of treatment plan with patient and/or surrogate as well as nursing, discussions with consultants, evaluation of patient's response to treatment, examination of patient, obtaining history from patient or surrogate, ordering and performing treatments and interventions, ordering and review of laboratory studies, ordering and review of radiographic studies, pulse oximetry and re-evaluation of patient's condition. MDM   Final diagnoses:  Dyspnea  Pneumonia  Descending thoracic aortic dissection  UTI (lower urinary tract infection)   The patient presents with a history of multiple symptoms over a 3 week course. There is a combination of findings of infectious illness consistent with UTI and respiratory  illness as well as the CT identified thoracic dissection. The patient does not immediately appear to have risk factors for dissection. Her blood pressures are normally normotensive and she does not require any medications. Her baseline status is for a very active and healthy 79 year old female.    Arby Barrette, MD 03/30/14 1137

## 2014-03-30 NOTE — ED Notes (Signed)
Pt. Given Bedside commode to urinate.  Pt. Asked for everyone to leave , she stated, "I got that."

## 2014-03-30 NOTE — Progress Notes (Addendum)
eLink Physician-Brief Progress Note Patient Name: Stephanie CarboJanice Ulrich DOB: 23-Feb-1935 MRN: [id number]   Date of Service  03/30/2014  HPI/Events of Note  Called d/t BP = 158/55. Hx thoracic aortic aneurysm.  eICU Interventions  Will order: 1. Hydralazine 5 mg IV Q 4 hours PRN SBP > 150 or DBP > 100.     Intervention Category Major Interventions: Hypertension - evaluation and management  Yennifer Segovia Eugene 03/30/2014, 11:06 PM

## 2014-03-30 NOTE — ED Notes (Signed)
Pt. s daughter came out and stated that pt. Was having difficulty time breathing.  Dr. Donnald GarrePfeiffer at the bedside. Pt.s BP 117/57,. 77 , 96% RA, 24.

## 2014-03-30 NOTE — ED Notes (Signed)
Pt refused help with the bedpan. Stated she would "do it all own her own" without assistance from staff or family.

## 2014-03-30 NOTE — ED Notes (Signed)
Admitting MD at the bedside.  

## 2014-03-30 NOTE — ED Notes (Signed)
Dr, Donnald GarrePfeiffer at the bedside

## 2014-03-30 NOTE — H&P (Signed)
Name: Stephanie Collier MRN: [id number] DOB: 16-Sep-1935    ADMISSION DATE:  03/30/2014  REFERRING MD :  EDP  CHIEF COMPLAINT:  Aortic dissection   BRIEF PATIENT DESCRIPTION:   79yo female with minimal PMH presented 3/19 to ER with severe low back pain and 3 week viral illness including fever, diarrhea, dry cough.  CT revealed thoracic aortic dissection and PCCM called to admit.    SIGNIFICANT EVENTS    STUDIES:  CT chest 3/19>>> descending thoracic aortic dissection of uncertain chronicity. Increased attenuation is noted within the false lumen which may represent some acute change. Bibasilar fibrotic changes. CT abd/pelvis 3/19>>> neg acute   HISTORY OF PRESENT ILLNESS:  79 yo female with PMH raynauds, scoliosis presented 3/19 to ER with severe low back pain and 3 week viral illness including fever, diarrhea, dry cough.  Per pt she has had some intermittent back pain beginning 3 weeks ago, this became worse 3-4 days ago and acutely worse on the morning of admit.  Currently c/o back pain although improved, jaw pain also improved, dry cough.  She denies chest pain, lightheadedness, syncope, hemoptysis.    PAST MEDICAL HISTORY :   has a past medical history of Osteoporosis; Scoliosis; and Raynaud's disease.  has past surgical history that includes Appendectomy. Prior to Admission medications   Medication Sig Start Date End Date Taking? Authorizing Provider  acetaminophen (TYLENOL) 325 MG tablet Take 650 mg by mouth every 6 (six) hours as needed.   Yes Historical Provider, MD   No Known Allergies  FAMILY HISTORY:  family history is not on file. SOCIAL HISTORY:  reports that she has never smoked. She does not have any smokeless tobacco history on file. She reports that she does not drink alcohol or use illicit drugs.  REVIEW OF SYSTEMS:   As per HPI - All other systems reviewed and were neg.    SUBJECTIVE:   VITAL SIGNS: Temp:  [97.5 F (36.4 C)] 97.5 F (36.4 C) (03/19 0636) Pulse  Rate:  [65-72] 69 (03/19 1300) Resp:  [17-31] 24 (03/19 1300) BP: (87-132)/(41-101) 111/53 mmHg (03/19 1300) SpO2:  [89 %-100 %] 99 % (03/19 1300) Weight:  [121 lb (54.885 kg)] 121 lb (54.885 kg) (03/19 0636)  PHYSICAL EXAMINATION: General:  Thin, pleasant female, NAD in ER  Neuro:  Awake, alert, appropriate, MAE  HEENT:  Mm moist, no JVD  Cardiovascular:  s1s2 rrr  Lungs:  resps even, non labored on [place], cta  Abdomen:  Round, soft, non tender, +bs  Musculoskeletal:  Warm and dry, no edema    Recent Labs Lab 03/30/14 0718  NA 139  K 3.5  CL 104  CO2 28  BUN 15  CREATININE 0.64  GLUCOSE 140*    Recent Labs Lab 03/30/14 0718  HGB 11.3*  HCT 35.5*  WBC 12.9*  PLT 315   Ct Abdomen Pelvis Wo Contrast  03/30/2014   CLINICAL DATA:  Back pain and shortness of Breath  EXAM: CT CHEST, ABDOMEN AND PELVIS WITHOUT CONTRAST  TECHNIQUE: Multidetector CT imaging of the chest, abdomen and pelvis was performed following the standard protocol without IV contrast. This study was originally ordered with contrast although the patient adamantly refuses to be administered the contrast  COMPARISON:  None.  FINDINGS: CT CHEST FINDINGS  The lungs are well aerated bilaterally. No focal infiltrate or sizable effusion is seen. Mild fibrotic changes are noted in the bases bilaterally. No parenchymal nodules are seen. Thoracic inlet demonstrates the right internal jugular vein  to be enlarged of uncertain significance. Calcifications of the thoracic aorta in its branches are noted. Additionally there are changes consistent with dissection of the descending thoracic aorta although the chronicity of this is uncertain given the lack of contrast. This appears to extend to the level of the aortic hiatus. Some increased attenuation is noted within the false lumen which may be related to acute changes. Given the patient's clinical history of acute back pain this must be viewed with some suspicion for an acute  dissection.  No hilar or mediastinal adenopathy is seen. Coronary calcifications are noted. The osseous structures of the chest show degenerative change of the thoracic spine. No other focal abnormality is noted.  CT ABDOMEN AND PELVIS FINDINGS  The liver, gallbladder, spleen, adrenal glands and pancreas are within normal limits. The kidneys are well visualized without renal calculi or obstructive changes. Some scarring is noted in the midportion of the left kidney.  Diffuse aortoiliac calcifications are noted. Mild aneurysmal dilatation of the aorta is seen to 3.1 cm. Ectasia of the common iliac arteries is noted bilaterally. There are no findings to suggest dissection within the abdominal aorta. Scattered diverticular change of the colon is seen. The appendix is not well visualized consistent with the surgical history. The bladder is partially distended. No pelvic mass lesion is seen. No significant lymphadenopathy is noted. The osseous structures of the abdomen demonstrate a scoliosis concave to the left of the lumbar spine. Mild degenerative changes are seen. No acute bony abnormality is noted.  IMPRESSION: CT of the chest: Changes consistent with descending thoracic aortic dissection of uncertain chronicity. Increased attenuation is noted within the false lumen which may represent some acute change. Given the patient's clinical history this must be viewed as an acute to subacute dissection. The patient should be encouraged to undergo contrast-enhanced CTA.  Bibasilar fibrotic changes.  CT of the abdomen and pelvis:  No acute abnormality is noted.  Mild aneurysmal dilatation of the abdominal aorta.  Critical Value/emergent results were called by telephone at the time of interpretation on 03/30/2014 at 10:46 am to Dr. Arby Barrette , who verbally acknowledged these results.   Electronically Signed   By: Alcide Clever M.D.   On: 03/30/2014 10:48   Dg Chest 2 View  03/30/2014   CLINICAL DATA:  Dyspnea with cough  for 2 weeks.  EXAM: CHEST  2 VIEW  COMPARISON:  None.  FINDINGS: There is hyperinflation. There is moderate cardiomegaly and aortic tortuosity. Basilar airspace opacities are present bilaterally without confluent consolidation. This could be infectious or atelectatic. No effusions are evident.  IMPRESSION: Hyperinflation with mild basilar airspace opacities, infectious versus atelectatic.   Electronically Signed   By: Ellery Plunk M.D.   On: 03/30/2014 07:02   Ct Chest Wo Contrast  03/30/2014   CLINICAL DATA:  Back pain and shortness of Breath  EXAM: CT CHEST, ABDOMEN AND PELVIS WITHOUT CONTRAST  TECHNIQUE: Multidetector CT imaging of the chest, abdomen and pelvis was performed following the standard protocol without IV contrast. This study was originally ordered with contrast although the patient adamantly refuses to be administered the contrast  COMPARISON:  None.  FINDINGS: CT CHEST FINDINGS  The lungs are well aerated bilaterally. No focal infiltrate or sizable effusion is seen. Mild fibrotic changes are noted in the bases bilaterally. No parenchymal nodules are seen. Thoracic inlet demonstrates the right internal jugular vein to be enlarged of uncertain significance. Calcifications of the thoracic aorta in its branches are noted. Additionally there  are changes consistent with dissection of the descending thoracic aorta although the chronicity of this is uncertain given the lack of contrast. This appears to extend to the level of the aortic hiatus. Some increased attenuation is noted within the false lumen which may be related to acute changes. Given the patient's clinical history of acute back pain this must be viewed with some suspicion for an acute dissection.  No hilar or mediastinal adenopathy is seen. Coronary calcifications are noted. The osseous structures of the chest show degenerative change of the thoracic spine. No other focal abnormality is noted.  CT ABDOMEN AND PELVIS FINDINGS  The liver,  gallbladder, spleen, adrenal glands and pancreas are within normal limits. The kidneys are well visualized without renal calculi or obstructive changes. Some scarring is noted in the midportion of the left kidney.  Diffuse aortoiliac calcifications are noted. Mild aneurysmal dilatation of the aorta is seen to 3.1 cm. Ectasia of the common iliac arteries is noted bilaterally. There are no findings to suggest dissection within the abdominal aorta. Scattered diverticular change of the colon is seen. The appendix is not well visualized consistent with the surgical history. The bladder is partially distended. No pelvic mass lesion is seen. No significant lymphadenopathy is noted. The osseous structures of the abdomen demonstrate a scoliosis concave to the left of the lumbar spine. Mild degenerative changes are seen. No acute bony abnormality is noted.  IMPRESSION: CT of the chest: Changes consistent with descending thoracic aortic dissection of uncertain chronicity. Increased attenuation is noted within the false lumen which may represent some acute change. Given the patient's clinical history this must be viewed as an acute to subacute dissection. The patient should be encouraged to undergo contrast-enhanced CTA.  Bibasilar fibrotic changes.  CT of the abdomen and pelvis:  No acute abnormality is noted.  Mild aneurysmal dilatation of the abdominal aorta.  Critical Value/emergent results were called by telephone at the time of interpretation on 03/30/2014 at 10:46 am to Dr. Arby BarretteMARCY Collier , who verbally acknowledged these results.   Electronically Signed   By: Alcide CleverMark  Lukens M.D.   On: 03/30/2014 10:48    ASSESSMENT / PLAN:   UTI  Descending thoracic aortic dissection -- d/w Dr. Laneta SimmersBartle who reviewed scans, no indication acute surgical intervention.  BP management and monitoring as below.  URI/bronchitis   PLAN -  Admit sdu for close observation  PRN morphine for pain  Monitor BP - no hx HTN and BP currently well  controlled  IV rocephin for UTI +/- purulent bronchitis Will ultimately need further w/u with CVTS for ?elective intervention at some point  Urine culture pending  F/u cbc    Will ask [facility] to assume care in am 3/20 if no clinical changes    Stephanie DressKaty Whiteheart, NP 03/30/2014  2:06 PM Pager: [phone] or [phone]  Attending Note:  I have examined patient, reviewed labs, studies and notes. I have discussed the case with Jasper RilingK Collier, and I agree with the data and plans as amended above. Ms Nedra HaiLee presents with acutely worse back pain, CT shows an acute / subacute descending aortic dissection. This does not extend down to the level of the renal arteries. On our evaluation she is hemodynamically stable (no HTN) but uncomfortable. Etiology of the event is unclear in absence of HTN as a risk factor - ? If she could have a collagen vascular disease or Marfan's disease. She does have a hx of Raynaud's. No surgical intervention indicated at this  time. Will admit for pain control and monitoring. She also has a UTI - now on abx.    Levy Pupa, MD, PhD 03/30/2014, 2:30 PM [facility] [phone] or if no answer [phone]

## 2014-03-30 NOTE — Progress Notes (Addendum)
CSW met with this 79 y/o patient with two of her daughters bedside.  Patient currently lives with one of her daughters here in [place].  At the patients request CSW completed ROI's for the patient's three daughters.  CSW made copies of the ROI's for her daughters and one daughter states she is the POA and states she will bring the documentation to registration later today.    CSW available as needed.  [facility] [place] [facility] ED CSW [phone]

## 2014-03-31 ENCOUNTER — Inpatient Hospital Stay (HOSPITAL_COMMUNITY): Payer: Medicare Other

## 2014-03-31 DIAGNOSIS — I10 Essential (primary) hypertension: Secondary | ICD-10-CM | POA: Diagnosis present

## 2014-03-31 DIAGNOSIS — I7101 Dissection of thoracic aorta: Secondary | ICD-10-CM

## 2014-03-31 DIAGNOSIS — J209 Acute bronchitis, unspecified: Secondary | ICD-10-CM | POA: Diagnosis present

## 2014-03-31 DIAGNOSIS — Z86718 Personal history of other venous thrombosis and embolism: Secondary | ICD-10-CM

## 2014-03-31 DIAGNOSIS — I7100 Dissection of unspecified site of aorta: Secondary | ICD-10-CM

## 2014-03-31 LAB — BASIC METABOLIC PANEL
Anion gap: 6 (ref 5–15)
BUN: 12 mg/dL (ref 6–23)
CHLORIDE: 105 mmol/L (ref 96–112)
CO2: 25 mmol/L (ref 19–32)
Calcium: 8.1 mg/dL — ABNORMAL LOW (ref 8.4–10.5)
Creatinine, Ser: 0.49 mg/dL — ABNORMAL LOW (ref 0.50–1.10)
GFR calc Af Amer: >90 mL/min (ref >90)
GFR calc non Af Amer: >90 mL/min (ref >90)
Glucose, Bld: 126 mg/dL — ABNORMAL HIGH (ref 70–99)
POTASSIUM: 3.7 mmol/L (ref 3.5–5.1)
SODIUM: 136 mmol/L (ref 135–145)

## 2014-03-31 LAB — CBC
HEMATOCRIT: 28.9 % — AB (ref 36.0–46.0)
HEMOGLOBIN: 9.1 g/dL — AB (ref 12.0–15.0)
MCH: 30.3 pg (ref 26.0–34.0)
MCHC: 31.5 g/dL (ref 30.0–36.0)
MCV: 96.3 fL (ref 78.0–100.0)
Platelets: 204 10*3/uL (ref 150–400)
RBC: 3 MIL/uL — ABNORMAL LOW (ref 3.87–5.11)
RDW: 14.1 % (ref 11.5–15.5)
WBC: 8.4 10*3/uL (ref 4.0–10.5)

## 2014-03-31 MED ORDER — IOHEXOL 350 MG/ML SOLN
100.0000 mL | Freq: Once | INTRAVENOUS | Status: AC | PRN
  Administered 2014-03-31: 100 mL via INTRAVENOUS

## 2014-03-31 NOTE — Progress Notes (Signed)
eLink Physician-Brief Progress Note Patient Name: Stephanie CarboJanice Collier DOB: 10/18/1935 MRN: [id number]   Date of Service  03/31/2014  HPI/Events of Note  Called by radiology d/t acute dissection of descending  thoracic aorta with progress. Renal arteries not involved at this time. Also R PE noted (new vs old).  eICU Interventions  Will order: 1. Duplex US of bilateral lower extremities to r/o DVT. If DVT present, would proceed with IVC filter placement.   Will also notify Thoracic Surgery about findings.     Intervention Category Minor Interventions: Clinical assessment - ordering diagnostic tests  Sommer,Steven Dennard N[place]ugene 03/31/2014, 4:10 PM

## 2014-03-31 NOTE — Consult Note (Addendum)
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 Malachy Chamber is an 79 y.o. female.  HPI:   The patient has been fairly healthy and in her usual state until about 3 weeks ago when she developed fever, body aches and cough as well as pain in her left back and diarrhea. She was seen in the ER on 3/72016 and diagnosed with a viral illness. She came back to the ER on 3/19 with continued cough, shortness of breath and pleuritic chest pain as well as back pain. She felt fatigued and ill. She felt like the back pain radiated up into both jaws. She had a CT scan of the chest without contrast since she refused it and this showed what looked like a type B aortic dissection extending from the left subclavian artery to the distal descending thoracic aorta. I reviewed her scan while I was in the OR and agreed that it was most likely a type B dissection and non-operative treatment was indicated. She was not hypertensive. She was admitted by CCM. She had a UTI by urinalysis and culture is pending. She has been treated with antibiotics for URI/bronchitis and UTI. She has continued to have some back pain as well as pleuritic chest pain with deep inspiration, poor appetite. No diarrhea. 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 There is no hemorrhage into the mediastinum or pleural space. There was also a nonocclusive PE seen in the superior segment of the RLL and within the RLL segmental and subsegmental branches.   Past Medical History  Diagnosis Date  . Osteoporosis   . Scoliosis   . Raynaud's disease     Past Surgical History  Procedure Laterality  Date  . Appendectomy      at age 16    No family history on file.  Social History:  reports that she has never smoked. She does not have any smokeless tobacco history on file. She reports that she does not drink alcohol or use illicit drugs.  Allergies: No Known Allergies  Medications:  I have reviewed the patient's current medications. Prior to Admission:  Prescriptions prior to admission  Medication Sig Dispense Refill Last Dose  . acetaminophen (TYLENOL) 325 MG tablet Take 650 mg by mouth every 6 (six) hours as needed.   03/30/2014 at Unknown time   Scheduled: . antiseptic oral rinse  7 mL Mouth Rinse BID  . cefTRIAXone (ROCEPHIN)  IV  1 g Intravenous Q24H  . pantoprazole  40 mg Oral Daily   Continuous: . sodium chloride 10 mL/hr (03/31/14 1215)   DPB:AQVOHC chloride, acetaminophen, hydrALAZINE, morphine injection, ondansetron (ZOFRAN) IV Anti-infectives    Start     Dose/Rate Route Frequency Ordered Stop   03/30/14 1400  cefTRIAXone (ROCEPHIN) 1 g in dextrose 5 % 50 mL IVPB     1 g 100 mL/hr over 30 Minutes Intravenous Every 24 hours 03/30/14 1316     03/30/14 0900  cefTRIAXone (ROCEPHIN) 1 g in dextrose 5 % 50 mL IVPB     1 g 100 mL/hr over 30 Minutes Intravenous  Once  03/30/14 0851 03/30/14 1119   03/30/14 0900  azithromycin (ZITHROMAX) 500 mg in dextrose 5 % 250 mL IVPB     500 mg 250 mL/hr over 60 Minutes Intravenous  Once 03/30/14 0851 03/30/14 1119      Results for orders placed or performed during the hospital encounter of 03/30/14 (from the past 48 hour(s))  CBC     Status: Abnormal   Collection Time: 03/30/14  7:18 AM  Result Value Ref Range   WBC 12.9 (H) 4.0 - 10.5 K/uL   RBC 3.74 (L) 3.87 - 5.11 MIL/uL   Hemoglobin 11.3 (L) 12.0 - 15.0 g/dL   HCT 35.5 (L) 36.0 - 46.0 %   MCV 94.9 78.0 - 100.0 fL   MCH 30.2 26.0 - 34.0 pg   MCHC 31.8 30.0 - 36.0 g/dL   RDW 14.0 11.5 - 15.5 %   Platelets 315 150 - 400 K/uL  Basic metabolic panel     Status: Abnormal     Collection Time: 03/30/14  7:18 AM  Result Value Ref Range   Sodium 139 135 - 145 mmol/L   Potassium 3.5 3.5 - 5.1 mmol/L   Chloride 104 96 - 112 mmol/L   CO2 28 19 - 32 mmol/L   Glucose, Bld 140 (H) 70 - 99 mg/dL   BUN 15 6 - 23 mg/dL   Creatinine, Ser 0.64 0.50 - 1.10 mg/dL   Calcium 8.5 8.4 - 10.5 mg/dL   GFR calc non Af Amer 85 (L) >90 mL/min   GFR calc Af Amer >90 >90 mL/min    Comment: (NOTE) The eGFR has been calculated using the CKD EPI equation. This calculation has not been validated in all clinical situations. eGFR's persistently <90 mL/min signify possible Chronic Kidney Disease.    Anion gap 7 5 - 15  BNP (order ONLY if patient complains of dyspnea/SOB AND you have documented it for THIS visit)     Status: None   Collection Time: 03/30/14  7:18 AM  Result Value Ref Range   B Natriuretic Peptide 68.2 0.0 - 100.0 pg/mL  Hepatic function panel     Status: Abnormal   Collection Time: 03/30/14  7:18 AM  Result Value Ref Range   Total Protein 7.2 6.0 - 8.3 g/dL   Albumin 2.9 (L) 3.5 - 5.2 g/dL   AST 38 (H) 0 - 37 U/L   ALT 27 0 - 35 U/L   Alkaline Phosphatase 64 39 - 117 U/L   Total Bilirubin 0.8 0.3 - 1.2 mg/dL   Bilirubin, Direct 0.2 0.0 - 0.5 mg/dL   Indirect Bilirubin 0.6 0.3 - 0.9 mg/dL  Lipase, blood     Status: None   Collection Time: 03/30/14  7:18 AM  Result Value Ref Range   Lipase 26 11 - 59 U/L  I-stat troponin, ED (not at [facility])     Status: None   Collection Time: 03/30/14  7:28 AM  Result Value Ref Range   Troponin i, poc 0.00 0.00 - 0.08 ng/mL   Comment 3            Comment: Due to the release kinetics of cTnI, a negative result within the first hours of the onset of symptoms does not rule out myocardial infarction with certainty. If myocardial infarction is still suspected, repeat the test at appropriate intervals.   Culture, blood (routine x 2)     Status: None (Preliminary result)   Collection Time: 03/30/14  8:28 AM  Result Value Ref  Range   Specimen Description BLOOD LEFT ARM    Special Requests BOTTLES DRAWN AEROBIC AND ANAEROBIC 5CC    Culture             BLOOD CULTURE RECEIVED NO GROWTH TO DATE CULTURE WILL BE HELD FOR 5 DAYS BEFORE ISSUING A FINAL NEGATIVE REPORT Performed at [facility]    Report Status PENDING   Culture, blood (routine x 2)     Status: None (Preliminary result)   Collection Time: 03/30/14  8:49 AM  Result Value Ref Range   Specimen Description BLOOD RIGHT ARM    Special Requests BOTTLES DRAWN AEROBIC AND ANAEROBIC 5CC    Culture             BLOOD CULTURE RECEIVED NO GROWTH TO DATE CULTURE WILL BE HELD FOR 5 DAYS BEFORE ISSUING A FINAL NEGATIVE REPORT Performed at [facility]    Report Status PENDING   I-stat troponin, ED     Status: None   Collection Time: 03/30/14  8:54 AM  Result Value Ref Range   Troponin i, poc 0.00 0.00 - 0.08 ng/mL   Comment 3            Comment: Due to the release kinetics of cTnI, a negative result within the first hours of the onset of symptoms does not rule out myocardial infarction with certainty. If myocardial infarction is still suspected, repeat the test at appropriate intervals.   I-Stat CG4 Lactic Acid, ED     Status: None   Collection Time: 03/30/14  8:56 AM  Result Value Ref Range   Lactic Acid, Venous 1.60 0.5 - 2.0 mmol/L  Urinalysis, Routine w reflex microscopic     Status: Abnormal   Collection Time: 03/30/14  9:56 AM  Result Value Ref Range   Color, Urine AMBER (A) YELLOW    Comment: BIOCHEMICALS MAY BE AFFECTED BY COLOR   APPearance TURBID (A) CLEAR   Specific Gravity, Urine 1.023 1.005 - 1.030   pH 5.0 5.0 - 8.0   Glucose, UA NEGATIVE NEGATIVE mg/dL   Hgb urine dipstick MODERATE (A) NEGATIVE   Bilirubin Urine NEGATIVE NEGATIVE   Ketones, ur NEGATIVE NEGATIVE mg/dL   Protein, ur 30 (A) NEGATIVE mg/dL   Urobilinogen, UA 0.2 0.0 - 1.0 mg/dL   Nitrite POSITIVE (A) NEGATIVE   Leukocytes, UA LARGE (A) NEGATIVE  Urine  microscopic-add on     Status: Abnormal   Collection Time: 03/30/14  9:56 AM  Result Value Ref Range   Squamous Epithelial / LPF FEW (A) RARE   WBC, UA TOO NUMEROUS TO COUNT <3 WBC/hpf   RBC / HPF 3-6 <3 RBC/hpf   Bacteria, UA MANY (A) RARE   Urine-Other LESS THAN 10 mL OF URINE SUBMITTED     Comment: MICROSCOPIC EXAM PERFORMED ON UNCONCENTRATED URINE  Influenza panel by PCR (type A & B, H1N1)     Status: None   Collection Time: 03/30/14 10:52 AM  Result Value Ref Range   Influenza A By PCR NEGATIVE NEGATIVE   Influenza B By PCR NEGATIVE NEGATIVE   H1N1 flu by pcr NOT DETECTED NOT DETECTED    Comment:        The Xpert Flu assay (FDA approved for nasal aspirates or washes and nasopharyngeal swab specimens), is intended as an aid in the diagnosis of influenza and should not be used as a sole basis for treatment.   Lactic acid, plasma     Status: None   Collection  Time: 03/30/14  2:35 PM  Result Value Ref Range   Lactic Acid, Venous 1.3 0.5 - 2.0 mmol/L  Procalcitonin     Status: None   Collection Time: 03/30/14  2:35 PM  Result Value Ref Range   Procalcitonin <0.10 ng/mL    Comment:        Interpretation: PCT (Procalcitonin) <= 0.5 ng/mL: Systemic infection (sepsis) is not likely. Local bacterial infection is possible. (NOTE)         ICU PCT Algorithm               Non ICU PCT Algorithm    ----------------------------     ------------------------------         PCT < 0.25 ng/mL                 PCT < 0.1 ng/mL     Stopping of antibiotics            Stopping of antibiotics       strongly encouraged.               strongly encouraged.    ----------------------------     ------------------------------       PCT level decrease by               PCT < 0.25 ng/mL       >= 80% from peak PCT       OR PCT 0.25 - 0.5 ng/mL          Stopping of antibiotics                                             encouraged.     Stopping of antibiotics           encouraged.     ----------------------------     ------------------------------       PCT level decrease by              PCT >= 0.25 ng/mL       < 80% from peak PCT        AND PCT >= 0.5 ng/mL            Continuin g antibiotics                                              encouraged.       Continuing antibiotics            encouraged.    ----------------------------     ------------------------------     PCT level increase compared          PCT > 0.5 ng/mL         with peak PCT AND          PCT >= 0.5 ng/mL             Escalation of antibiotics                                          strongly encouraged.      Escalation of antibiotics        strongly encouraged.   MRSA PCR Screening  Status: None   Collection Time: 03/30/14  4:03 PM  Result Value Ref Range   MRSA by PCR NEGATIVE NEGATIVE    Comment:        The GeneXpert MRSA Assay (FDA approved for NASAL specimens only), is one component of a comprehensive MRSA colonization surveillance program. It is not intended to diagnose MRSA infection nor to guide or monitor treatment for MRSA infections.   CBC     Status: Abnormal   Collection Time: 03/31/14  4:25 AM  Result Value Ref Range   WBC 8.4 4.0 - 10.5 K/uL    Comment: REPEATED TO VERIFY   RBC 3.00 (L) 3.87 - 5.11 MIL/uL   Hemoglobin 9.1 (L) 12.0 - 15.0 g/dL    Comment: REPEATED TO VERIFY DELTA CHECK NOTED    HCT 28.9 (L) 36.0 - 46.0 %   MCV 96.3 78.0 - 100.0 fL   MCH 30.3 26.0 - 34.0 pg   MCHC 31.5 30.0 - 36.0 g/dL   RDW 14.1 11.5 - 15.5 %   Platelets 204 150 - 400 K/uL    Comment: REPEATED TO VERIFY DELTA CHECK NOTED   Basic metabolic panel     Status: Abnormal   Collection Time: 03/31/14  4:25 AM  Result Value Ref Range   Sodium 136 135 - 145 mmol/L   Potassium 3.7 3.5 - 5.1 mmol/L   Chloride 105 96 - 112 mmol/L   CO2 25 19 - 32 mmol/L   Glucose, Bld 126 (H) 70 - 99 mg/dL   BUN 12 6 - 23 mg/dL   Creatinine, Ser 0.49 (L) 0.50 - 1.10 mg/dL   Calcium 8.1 (L) 8.4 - 10.5  mg/dL   GFR calc non Af Amer >90 >90 mL/min   GFR calc Af Amer >90 >90 mL/min    Comment: (NOTE) The eGFR has been calculated using the CKD EPI equation. This calculation has not been validated in all clinical situations. eGFR's persistently <90 mL/min signify possible Chronic Kidney Disease.    Anion gap 6 5 - 15    Ct Abdomen Pelvis Wo Contrast  03/30/2014   CLINICAL DATA:  Back pain and shortness of Breath  EXAM: CT CHEST, ABDOMEN AND PELVIS WITHOUT CONTRAST  TECHNIQUE: Multidetector CT imaging of the chest, abdomen and pelvis was performed following the standard protocol without IV contrast. This study was originally ordered with contrast although the patient adamantly refuses to be administered the contrast  COMPARISON:  None.  FINDINGS: CT CHEST FINDINGS  The lungs are well aerated bilaterally. No focal infiltrate or sizable effusion is seen. Mild fibrotic changes are noted in the bases bilaterally. No parenchymal nodules are seen. Thoracic inlet demonstrates the right internal jugular vein to be enlarged of uncertain significance. Calcifications of the thoracic aorta in its branches are noted. Additionally there are changes consistent with dissection of the descending thoracic aorta although the chronicity of this is uncertain given the lack of contrast. This appears to extend to the level of the aortic hiatus. Some increased attenuation is noted within the false lumen which may be related to acute changes. Given the patient's clinical history of acute back pain this must be viewed with some suspicion for an acute dissection.  No hilar or mediastinal adenopathy is seen. Coronary calcifications are noted. The osseous structures of the chest show degenerative change of the thoracic spine. No other focal abnormality is noted.  CT ABDOMEN AND PELVIS FINDINGS  The liver, gallbladder, spleen, adrenal glands and pancreas are within normal  limits. The kidneys are well visualized without renal calculi or  obstructive changes. Some scarring is noted in the midportion of the left kidney.  Diffuse aortoiliac calcifications are noted. Mild aneurysmal dilatation of the aorta is seen to 3.1 cm. Ectasia of the common iliac arteries is noted bilaterally. There are no findings to suggest dissection within the abdominal aorta. Scattered diverticular change of the colon is seen. The appendix is not well visualized consistent with the surgical history. The bladder is partially distended. No pelvic mass lesion is seen. No significant lymphadenopathy is noted. The osseous structures of the abdomen demonstrate a scoliosis concave to the left of the lumbar spine. Mild degenerative changes are seen. No acute bony abnormality is noted.  IMPRESSION: CT of the chest: Changes consistent with descending thoracic aortic dissection of uncertain chronicity. Increased attenuation is noted within the false lumen which may represent some acute change. Given the patient's clinical history this must be viewed as an acute to subacute dissection. The patient should be encouraged to undergo contrast-enhanced CTA.  Bibasilar fibrotic changes.  CT of the abdomen and pelvis:  No acute abnormality is noted.  Mild aneurysmal dilatation of the abdominal aorta.  Critical Value/emergent results were called by telephone at the time of interpretation on 03/30/2014 at 10:46 am to Dr. Charlesetta Shanks , who verbally acknowledged these results.   Electronically Signed   By: Inez Catalina M.D.   On: 03/30/2014 10:48   Dg Chest 2 View  03/30/2014   CLINICAL DATA:  Dyspnea with cough for 2 weeks.  EXAM: CHEST  2 VIEW  COMPARISON:  None.  FINDINGS: There is hyperinflation. There is moderate cardiomegaly and aortic tortuosity. Basilar airspace opacities are present bilaterally without confluent consolidation. This could be infectious or atelectatic. No effusions are evident.  IMPRESSION: Hyperinflation with mild basilar airspace opacities, infectious versus  atelectatic.   Electronically Signed   By: Andreas Newport M.D.   On: 03/30/2014 07:02   Ct Chest Wo Contrast  03/30/2014   CLINICAL DATA:  Back pain and shortness of Breath  EXAM: CT CHEST, ABDOMEN AND PELVIS WITHOUT CONTRAST  TECHNIQUE: Multidetector CT imaging of the chest, abdomen and pelvis was performed following the standard protocol without IV contrast. This study was originally ordered with contrast although the patient adamantly refuses to be administered the contrast  COMPARISON:  None.  FINDINGS: CT CHEST FINDINGS  The lungs are well aerated bilaterally. No focal infiltrate or sizable effusion is seen. Mild fibrotic changes are noted in the bases bilaterally. No parenchymal nodules are seen. Thoracic inlet demonstrates the right internal jugular vein to be enlarged of uncertain significance. Calcifications of the thoracic aorta in its branches are noted. Additionally there are changes consistent with dissection of the descending thoracic aorta although the chronicity of this is uncertain given the lack of contrast. This appears to extend to the level of the aortic hiatus. Some increased attenuation is noted within the false lumen which may be related to acute changes. Given the patient's clinical history of acute back pain this must be viewed with some suspicion for an acute dissection.  No hilar or mediastinal adenopathy is seen. Coronary calcifications are noted. The osseous structures of the chest show degenerative change of the thoracic spine. No other focal abnormality is noted.  CT ABDOMEN AND PELVIS FINDINGS  The liver, gallbladder, spleen, adrenal glands and pancreas are within normal limits. The kidneys are well visualized without renal calculi or obstructive changes. Some scarring is noted in the  midportion of the left kidney.  Diffuse aortoiliac calcifications are noted. Mild aneurysmal dilatation of the aorta is seen to 3.1 cm. Ectasia of the common iliac arteries is noted bilaterally.  There are no findings to suggest dissection within the abdominal aorta. Scattered diverticular change of the colon is seen. The appendix is not well visualized consistent with the surgical history. The bladder is partially distended. No pelvic mass lesion is seen. No significant lymphadenopathy is noted. The osseous structures of the abdomen demonstrate a scoliosis concave to the left of the lumbar spine. Mild degenerative changes are seen. No acute bony abnormality is noted.  IMPRESSION: CT of the chest: Changes consistent with descending thoracic aortic dissection of uncertain chronicity. Increased attenuation is noted within the false lumen which may represent some acute change. Given the patient's clinical history this must be viewed as an acute to subacute dissection. The patient should be encouraged to undergo contrast-enhanced CTA.  Bibasilar fibrotic changes.  CT of the abdomen and pelvis:  No acute abnormality is noted.  Mild aneurysmal dilatation of the abdominal aorta.  Critical Value/emergent results were called by telephone at the time of interpretation on 03/30/2014 at 10:46 am to Dr. Charlesetta Shanks , who verbally acknowledged these results.   Electronically Signed   By: Inez Catalina M.D.   On: 03/30/2014 10:48   Ct Angio Chest Aorta W/cm &/or Wo/cm  03/31/2014   CLINICAL DATA:  79 year old female with back pain, fever and jaw pain for the past several weeks. Possible aortic dissection.  EXAM: CT ANGIOGRAPHY CHEST WITH CONTRAST  TECHNIQUE: Multidetector CT imaging of the chest was performed using the standard protocol during bolus administration of intravenous contrast. Multiplanar CT image reconstructions and MIPs were obtained to evaluate the vascular anatomy.  CONTRAST:  124m OMNIPAQUE IOHEXOL 350 MG/ML SOLN  COMPARISON:  CT scan of the chest, abdomen and pelvis performed without intravenous contrast 03/30/2014  FINDINGS: Mediastinum: Unremarkable CT appearance of the thyroid gland. No suspicious  mediastinal or hilar adenopathy. No soft tissue mediastinal mass. The thoracic esophagus is unremarkable.  Heart/Vascular: The initial noncontrast images, crescentic high attenuation material is noted beginning in the distal arch just after the origin of the left subclavian artery and extending inferiorly along the descending thoracic aorta consistent with acute intramural hematoma. On the contrasted images, there is focal disruption of the descending thoracic aortic intima at approximately the 2 o'clock position (image 64 series 501) with extension of contrast material into the media both proximally and distally. These findings are most consistent with progression of a penetrating aortic ulcer into acute focal dissection an propagation of intramural hematoma. The dissection flap does not extend into the great vessels or into the abdominal aorta although the intramural hematoma does extend into the upper abdominal aorta.  The tubular portion of the ascending thoracic aorta is ectatic at approximately 3.5 cm. Motion slightly limits evaluation. Cardiomegaly with left ventricular dilatation. Atherosclerotic calcifications present throughout the coronary arteries. Although this study was not optimized for opacifications pulmonary arteries, nonocclusive a central filling defects can be identified within segmental branches of the superior segment of the right lower lobe and within the right lower lobe branches extending into segmental and subsegmental branches distally. No evidence of right heart strain, the RV/LV ratio is normal.  Lungs/Pleura: Mild interlobular septal thickening in the upper lungs consistent with early interstitial edema. There are small layering bilateral pleural effusions as well as a mild bibasilar atelectasis. No focal airspace consolidation. Diffuse mild lower lobe bronchial  wall thickening.  Bones/Soft Tissues: No acute fracture or aggressive appearing lytic or blastic osseous lesion.  Upper  Abdomen: Extension of intramural hematoma into the visualized abdominal aortic aneurysm. No dissection flap.  Review of the MIP images confirms the above findings.  IMPRESSION: 1. Findings are most consistent with progression of a penetrating aortic ulcer into acute focal dissection in the descending thoracic aorta at the level of the left main pulmonary artery with propagation of intramural hematoma proximally to the origin of the left subclavian artery an distally into the upper abdominal aorta. The focal dissection flap remains focal and localized in the descending thoracic aorta. No evidence of rupture into the pleural space or mediastinum at this time. No significant interval change comparing to the noncontrast study from yesterday. Findings would likely be amenable to endovascular repair. Recommend consultation (urgent, but not emergent) with cardiothoracic and vascular surgery. 2. Positive for small to moderate burden nonocclusive acute to subacute pulmonary embolus in the lobar, segmental and subsegmental branches of the right lower lobe pulmonary artery. There is no evidence of associated right heart strain. 3. Cardiomegaly with left ventricular dilatation. 4. Atherosclerosis including coronary artery calcification. 5. Probable very mild early interstitial edema in the upper lungs. 6. Small bilateral layering pleural effusions.  Critical Value/emergent results were called by telephone at the time of interpretation on 03/31/2014 at 3:48 pm to Dr. Oletta Darter , who verbally acknowledged these results.  Signed,  Criselda Peaches, MD  [facility]  [facility]   Electronically Signed   By: Jacqulynn Cadet M.D.   On: 03/31/2014 15:48    Review of Systems  Constitutional: Positive for weight loss and malaise/fatigue. Negative for fever and chills.  Eyes: Negative.   Respiratory: Positive for cough, sputum production, shortness of breath and wheezing. Negative for  hemoptysis.   Cardiovascular: Positive for leg swelling. Negative for orthopnea and PND.       Left back pain  Gastrointestinal: Negative.  Negative for nausea, vomiting, abdominal pain and diarrhea.  Genitourinary: Negative.   Musculoskeletal: Positive for back pain.  Skin: Negative.   Neurological: Positive for headaches.  Endo/Heme/Allergies: Negative.   Psychiatric/Behavioral: Negative.    Blood pressure 154/76, pulse 73, temperature 98.6 F (37 C), temperature source Oral, resp. rate 18, height _0  (1.575 m), weight 60.782 kg (134 lb), SpO2 94 %. Physical Exam  Constitutional: She is oriented to person, place, and time.  Thin, frail-appearing woman who looks uncomfortable.  HENT:  Head: Normocephalic and atraumatic.  Mouth/Throat: Oropharynx is clear and moist.  Eyes: EOM are normal. Pupils are equal, round, and reactive to light.  Neck: Normal range of motion. Neck supple. No JVD present. No thyromegaly present.  Cardiovascular: Normal rate, regular rhythm, normal heart sounds and intact distal pulses.  Exam reveals no friction rub.   No murmur heard. Radial, femoral and dp, pt pulses palpable and equal bilat  Respiratory: Effort normal. No respiratory distress. She exhibits no tenderness.  Coarse BS bilat  GI: Soft. Bowel sounds are normal. She exhibits no distension and no mass. There is no tenderness.  Musculoskeletal: Normal range of motion. She exhibits no edema.  Lymphadenopathy:    She has no cervical adenopathy.  Neurological: She is alert and oriented to person, place, and time. She has normal strength. No cranial nerve deficit or sensory deficit.  Skin: Skin is warm and dry.  Psychiatric: She has a normal mood and affect.    CLINICAL DATA: 79 year old female with  back pain, fever and jaw pain for the past several weeks. Possible aortic dissection.  EXAM: CT ANGIOGRAPHY CHEST WITH CONTRAST  TECHNIQUE: Multidetector CT imaging of the chest was performed  using the standard protocol during bolus administration of intravenous contrast. Multiplanar CT image reconstructions and MIPs were obtained to evaluate the vascular anatomy.  CONTRAST: 196m OMNIPAQUE IOHEXOL 350 MG/ML SOLN  COMPARISON: CT scan of the chest, abdomen and pelvis performed without intravenous contrast 03/30/2014  FINDINGS: Mediastinum: Unremarkable CT appearance of the thyroid gland. No suspicious mediastinal or hilar adenopathy. No soft tissue mediastinal mass. The thoracic esophagus is unremarkable.  Heart/Vascular: The initial noncontrast images, crescentic high attenuation material is noted beginning in the distal arch just after the origin of the left subclavian artery and extending inferiorly along the descending thoracic aorta consistent with acute intramural hematoma. On the contrasted images, there is focal disruption of the descending thoracic aortic intima at approximately the 2 o'clock position (image 64 series 501) with extension of contrast material into the media both proximally and distally. These findings are most consistent with progression of a penetrating aortic ulcer into acute focal dissection an propagation of intramural hematoma. The dissection flap does not extend into the great vessels or into the abdominal aorta although the intramural hematoma does extend into the upper abdominal aorta.  The tubular portion of the ascending thoracic aorta is ectatic at approximately 3.5 cm. Motion slightly limits evaluation. Cardiomegaly with left ventricular dilatation. Atherosclerotic calcifications present throughout the coronary arteries. Although this study was not optimized for opacifications pulmonary arteries, nonocclusive a central filling defects can be identified within segmental branches of the superior segment of the right lower lobe and within the right lower lobe branches extending into segmental and subsegmental branches distally.  No evidence of right heart strain, the RV/LV ratio is normal.  Lungs/Pleura: Mild interlobular septal thickening in the upper lungs consistent with early interstitial edema. There are small layering bilateral pleural effusions as well as a mild bibasilar atelectasis. No focal airspace consolidation. Diffuse mild lower lobe bronchial wall thickening.  Bones/Soft Tissues: No acute fracture or aggressive appearing lytic or blastic osseous lesion.  Upper Abdomen: Extension of intramural hematoma into the visualized abdominal aortic aneurysm. No dissection flap.  Review of the MIP images confirms the above findings.  IMPRESSION: 1. Findings are most consistent with progression of a penetrating aortic ulcer into acute focal dissection in the descending thoracic aorta at the level of the left main pulmonary artery with propagation of intramural hematoma proximally to the origin of the left subclavian artery an distally into the upper abdominal aorta. The focal dissection flap remains focal and localized in the descending thoracic aorta. No evidence of rupture into the pleural space or mediastinum at this time. No significant interval change comparing to the noncontrast study from yesterday. Findings would likely be amenable to endovascular repair. Recommend consultation (urgent, but not emergent) with cardiothoracic and vascular surgery. 2. Positive for small to moderate burden nonocclusive acute to subacute pulmonary embolus in the lobar, segmental and subsegmental branches of the right lower lobe pulmonary artery. There is no evidence of associated right heart strain. 3. Cardiomegaly with left ventricular dilatation. 4. Atherosclerosis including coronary artery calcification. 5. Probable very mild early interstitial edema in the upper lungs. 6. Small bilateral layering pleural effusions.  Critical Value/emergent results were called by telephone at the time of interpretation on  03/31/2014 at 3:48 pm to Dr. SOletta Darter, who verbally acknowledged these results.  Signed,  HAntonietta Jewel  Laurence Ferrari, MD  [facility]  [facility]   Electronically Signed  By: Jacqulynn Cadet M.D.  On: 03/31/2014 15:48     Assessment/Plan:  I have personally reviewed both of her CT scans, her medical record and examined her.  1. 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 This is most likely responsible for her pain. This can be treat with an endovascular stent graft. I will review with vascular surgery tomorrow and coordinate timing of this. In the mean time good blood pressure control is important.  2. Acute or subacute right lung PE. She can't be anticoagulated at this point due to her aorta. I would get a venous duplex of the legs and depending on the findings consider an IVC filter.   3. I think it would be worthwhile having cardiology see her. She has had pain up into both jaws, an abnormal ECG and has calcified plaque in her left main and proximal LAD and CT scan.   4. UTI: being treated with antibiotics  5. Possible bronchitis: her cough seems out of proportion to her findings on CT. There is no sign of pneumonia. I guess the PE could be aggravating this.   I discussed the CT scan findings with her and her daughter and the plans as outlined above. Stephanie Collier 03/31/2014, 5:12 PM

## 2014-03-31 NOTE — Progress Notes (Signed)
UR Completed.  [phone]  

## 2014-03-31 NOTE — Progress Notes (Signed)
Name: Stephanie Collier MRN: [id number] DOB: [birth year]    ADMISSION DATE:  03/30/2014  REFERRING MD :  EDP  CHIEF COMPLAINT:  Aortic dissection   BRIEF PATIENT DESCRIPTION:   79yo female with minimal PMH presented 3/19 to ER with severe low back pain and 3 week viral illness including fever, diarrhea, dry cough.  CT revealed thoracic aortic dissection and PCCM called to admit.    SIGNIFICANT EVENTS    STUDIES:  CT chest 3/19>>> descending thoracic aortic dissection of uncertain chronicity. Increased attenuation is noted within the false lumen which may represent some acute change. Bibasilar fibrotic changes. CT abd/pelvis 3/19>>> neg acute    SUBJECTIVE:  Pt states pain is less.  VITAL SIGNS: Temp:  [97.9 F (36.6 C)-98.8 F (37.1 C)] 98.1 F (36.7 C) (03/20 0902) Pulse Rate:  [65-79] 75 (03/20 0902) Resp:  [17-31] 18 (03/20 0902) BP: (87-158)/(45-101) 126/73 mmHg (03/20 0902) SpO2:  [89 %-100 %] 96 % (03/20 0902) Weight:  [55.475 kg (122 lb 4.8 oz)-60.782 kg (134 lb)] 60.782 kg (134 lb) (03/20 0410)  PHYSICAL EXAMINATION: General:  Thin, pleasant female, NAD in ER  Neuro:  Awake, alert, appropriate, MAE  HEENT:  Mm moist, no JVD  Cardiovascular:  s1s2 rrr  Lungs:  resps even, non labored on [place], cta  Abdomen:  Round, soft, non tender, +bs  Musculoskeletal:  Warm and dry, no edema    Recent Labs Lab 03/30/14 0718 03/31/14 0425  NA 139 136  K 3.5 3.7  CL 104 105  CO2 28 25  BUN 15 12  CREATININE 0.64 0.49*  GLUCOSE 140* 126*    Recent Labs Lab 03/30/14 0718 03/31/14 0425  HGB 11.3* 9.1*  HCT 35.5* 28.9*  WBC 12.9* 8.4  PLT 315 204   Ct Abdomen Pelvis Wo Contrast  03/30/2014   CLINICAL DATA:  Back pain and shortness of Breath  EXAM: CT CHEST, ABDOMEN AND PELVIS WITHOUT CONTRAST  TECHNIQUE: Multidetector CT imaging of the chest, abdomen and pelvis was performed following the standard protocol without IV contrast. This study was originally ordered with  contrast although the patient adamantly refuses to be administered the contrast  COMPARISON:  None.  FINDINGS: CT CHEST FINDINGS  The lungs are well aerated bilaterally. No focal infiltrate or sizable effusion is seen. Mild fibrotic changes are noted in the bases bilaterally. No parenchymal nodules are seen. Thoracic inlet demonstrates the right internal jugular vein to be enlarged of uncertain significance. Calcifications of the thoracic aorta in its branches are noted. Additionally there are changes consistent with dissection of the descending thoracic aorta although the chronicity of this is uncertain given the lack of contrast. This appears to extend to the level of the aortic hiatus. Some increased attenuation is noted within the false lumen which may be related to acute changes. Given the patient's clinical history of acute back pain this must be viewed with some suspicion for an acute dissection.  No hilar or mediastinal adenopathy is seen. Coronary calcifications are noted. The osseous structures of the chest show degenerative change of the thoracic spine. No other focal abnormality is noted.  CT ABDOMEN AND PELVIS FINDINGS  The liver, gallbladder, spleen, adrenal glands and pancreas are within normal limits. The kidneys are well visualized without renal calculi or obstructive changes. Some scarring is noted in the midportion of the left kidney.  Diffuse aortoiliac calcifications are noted. Mild aneurysmal dilatation of the aorta is seen to 3.1 cm. Ectasia of the common iliac arteries is  noted bilaterally. There are no findings to suggest dissection within the abdominal aorta. Scattered diverticular change of the colon is seen. The appendix is not well visualized consistent with the surgical history. The bladder is partially distended. No pelvic mass lesion is seen. No significant lymphadenopathy is noted. The osseous structures of the abdomen demonstrate a scoliosis concave to the left of the lumbar spine.  Mild degenerative changes are seen. No acute bony abnormality is noted.  IMPRESSION: CT of the chest: Changes consistent with descending thoracic aortic dissection of uncertain chronicity. Increased attenuation is noted within the false lumen which may represent some acute change. Given the patient's clinical history this must be viewed as an acute to subacute dissection. The patient should be encouraged to undergo contrast-enhanced CTA.  Bibasilar fibrotic changes.  CT of the abdomen and pelvis:  No acute abnormality is noted.  Mild aneurysmal dilatation of the abdominal aorta.  Critical Value/emergent results were called by telephone at the time of interpretation on 03/30/2014 at 10:46 am to Dr. Arby Barrette , who verbally acknowledged these results.   Electronically Signed   By: Alcide Clever M.D.   On: 03/30/2014 10:48   Dg Chest 2 View  03/30/2014   CLINICAL DATA:  Dyspnea with cough for 2 weeks.  EXAM: CHEST  2 VIEW  COMPARISON:  None.  FINDINGS: There is hyperinflation. There is moderate cardiomegaly and aortic tortuosity. Basilar airspace opacities are present bilaterally without confluent consolidation. This could be infectious or atelectatic. No effusions are evident.  IMPRESSION: Hyperinflation with mild basilar airspace opacities, infectious versus atelectatic.   Electronically Signed   By: Ellery Plunk M.D.   On: 03/30/2014 07:02   Ct Chest Wo Contrast  03/30/2014   CLINICAL DATA:  Back pain and shortness of Breath  EXAM: CT CHEST, ABDOMEN AND PELVIS WITHOUT CONTRAST  TECHNIQUE: Multidetector CT imaging of the chest, abdomen and pelvis was performed following the standard protocol without IV contrast. This study was originally ordered with contrast although the patient adamantly refuses to be administered the contrast  COMPARISON:  None.  FINDINGS: CT CHEST FINDINGS  The lungs are well aerated bilaterally. No focal infiltrate or sizable effusion is seen. Mild fibrotic changes are noted in the  bases bilaterally. No parenchymal nodules are seen. Thoracic inlet demonstrates the right internal jugular vein to be enlarged of uncertain significance. Calcifications of the thoracic aorta in its branches are noted. Additionally there are changes consistent with dissection of the descending thoracic aorta although the chronicity of this is uncertain given the lack of contrast. This appears to extend to the level of the aortic hiatus. Some increased attenuation is noted within the false lumen which may be related to acute changes. Given the patient's clinical history of acute back pain this must be viewed with some suspicion for an acute dissection.  No hilar or mediastinal adenopathy is seen. Coronary calcifications are noted. The osseous structures of the chest show degenerative change of the thoracic spine. No other focal abnormality is noted.  CT ABDOMEN AND PELVIS FINDINGS  The liver, gallbladder, spleen, adrenal glands and pancreas are within normal limits. The kidneys are well visualized without renal calculi or obstructive changes. Some scarring is noted in the midportion of the left kidney.  Diffuse aortoiliac calcifications are noted. Mild aneurysmal dilatation of the aorta is seen to 3.1 cm. Ectasia of the common iliac arteries is noted bilaterally. There are no findings to suggest dissection within the abdominal aorta. Scattered diverticular change of the  colon is seen. The appendix is not well visualized consistent with the surgical history. The bladder is partially distended. No pelvic mass lesion is seen. No significant lymphadenopathy is noted. The osseous structures of the abdomen demonstrate a scoliosis concave to the left of the lumbar spine. Mild degenerative changes are seen. No acute bony abnormality is noted.  IMPRESSION: CT of the chest: Changes consistent with descending thoracic aortic dissection of uncertain chronicity. Increased attenuation is noted within the false lumen which may  represent some acute change. Given the patient's clinical history this must be viewed as an acute to subacute dissection. The patient should be encouraged to undergo contrast-enhanced CTA.  Bibasilar fibrotic changes.  CT of the abdomen and pelvis:  No acute abnormality is noted.  Mild aneurysmal dilatation of the abdominal aorta.  Critical Value/emergent results were called by telephone at the time of interpretation on 03/30/2014 at 10:46 am to Dr. Arby Barrette , who verbally acknowledged these results.   Electronically Signed   By: Alcide Clever M.D.   On: 03/30/2014 10:48    ASSESSMENT / PLAN:   UTI  Descending thoracic aortic dissection -- d/w Dr. Laneta Simmers who reviewed scans, no indication acute surgical intervention.  BP management and monitoring as below.  URI/bronchitis  HTN now better    PLAN -  PRN morphine for pain  Monitor BP - prn hydralazine. BP better this am  Cont IV rocephin for UTI +/- purulent bronchitis Needs TCTS f/u  Urine culture pending    I updated the daughters at bedside  Caryl Bis  [phone]  Cell  [phone]  If no response or cell goes to voicemail, call beeper [phone]   03/31/2014, 10:10 AM [facility]

## 2014-04-01 DIAGNOSIS — I2699 Other pulmonary embolism without acute cor pulmonale: Secondary | ICD-10-CM

## 2014-04-01 DIAGNOSIS — J209 Acute bronchitis, unspecified: Secondary | ICD-10-CM

## 2014-04-01 LAB — CBC WITH DIFFERENTIAL/PLATELET
Basophils Absolute: 0 10*3/uL (ref 0.0–0.1)
Basophils Relative: 0 % (ref 0–1)
EOS ABS: 0 10*3/uL (ref 0.0–0.7)
EOS PCT: 0 % (ref 0–5)
HCT: 30.5 % — ABNORMAL LOW (ref 36.0–46.0)
HEMOGLOBIN: 9.7 g/dL — AB (ref 12.0–15.0)
LYMPHS ABS: 1.4 10*3/uL (ref 0.7–4.0)
LYMPHS PCT: 16 % (ref 12–46)
MCH: 30.1 pg (ref 26.0–34.0)
MCHC: 31.8 g/dL (ref 30.0–36.0)
MCV: 94.7 fL (ref 78.0–100.0)
MONOS PCT: 11 % (ref 3–12)
Monocytes Absolute: 1 10*3/uL (ref 0.1–1.0)
Neutro Abs: 6.4 10*3/uL (ref 1.7–7.7)
Neutrophils Relative %: 73 % (ref 43–77)
Platelets: 246 10*3/uL (ref 150–400)
RBC: 3.22 MIL/uL — ABNORMAL LOW (ref 3.87–5.11)
RDW: 14.1 % (ref 11.5–15.5)
WBC: 8.8 10*3/uL (ref 4.0–10.5)

## 2014-04-01 LAB — BASIC METABOLIC PANEL
ANION GAP: 7 (ref 5–15)
BUN: 7 mg/dL (ref 6–23)
CO2: 27 mmol/L (ref 19–32)
CREATININE: 0.53 mg/dL (ref 0.50–1.10)
Calcium: 8.6 mg/dL (ref 8.4–10.5)
Chloride: 103 mmol/L (ref 96–112)
GFR calc Af Amer: >90 mL/min (ref >90)
GFR calc non Af Amer: 90 mL/min — ABNORMAL LOW (ref >90)
Glucose, Bld: 98 mg/dL (ref 70–99)
Potassium: 3.6 mmol/L (ref 3.5–5.1)
Sodium: 137 mmol/L (ref 135–145)

## 2014-04-01 MED ORDER — DEXTROSE 5 % IV SOLN
500.0000 mg | INTRAVENOUS | Status: DC
  Filled 2014-04-01: qty 500

## 2014-04-01 MED ORDER — DM-GUAIFENESIN ER 30-600 MG PO TB12
1.0000 | ORAL_TABLET | Freq: Two times a day (BID) | ORAL | Status: DC
Start: 2014-04-01 — End: 2014-04-02
  Administered 2014-04-01 – 2014-04-02 (×2): 1 via ORAL
  Filled 2014-04-01 (×3): qty 1

## 2014-04-01 MED ORDER — FENTANYL CITRATE 0.05 MG/ML IJ SOLN: 25.0000 ug | INTRAMUSCULAR | Status: DC | PRN

## 2014-04-01 MED ORDER — METOPROLOL TARTRATE 1 MG/ML IV SOLN: 2.5000 mg | Freq: Three times a day (TID) | INTRAVENOUS | Status: DC

## 2014-04-01 MED ORDER — MORPHINE SULFATE 2 MG/ML IJ SOLN
2.0000 mg | INTRAMUSCULAR | Status: DC | PRN
Start: 2014-04-01 — End: 2014-04-04
  Administered 2014-04-01: 4 mg via INTRAVENOUS
  Administered 2014-04-02: 2 mg via INTRAVENOUS
  Administered 2014-04-02: 4 mg via INTRAVENOUS
  Administered 2014-04-02 – 2014-04-04 (×6): 2 mg via INTRAVENOUS
  Filled 2014-04-01 (×6): qty 1
  Filled 2014-04-01 (×2): qty 2
  Filled 2014-04-01: qty 1

## 2014-04-01 MED ORDER — METOPROLOL TARTRATE 1 MG/ML IV SOLN
2.5000 mg | INTRAVENOUS | Status: DC | PRN
  Administered 2014-04-03 – 2014-04-04 (×5): 2.5 mg via INTRAVENOUS
  Filled 2014-04-01 (×5): qty 5

## 2014-04-01 MED ORDER — METOPROLOL TARTRATE 50 MG PO TABS
50.0000 mg | ORAL_TABLET | Freq: Two times a day (BID) | ORAL | Status: DC
  Administered 2014-04-01 – 2014-04-04 (×7): 50 mg via ORAL
  Filled 2014-04-01 (×8): qty 1

## 2014-04-01 MED ORDER — FENTANYL CITRATE 0.05 MG/ML IJ SOLN
INTRAMUSCULAR | Status: AC
  Filled 2014-04-01: qty 2

## 2014-04-01 MED ORDER — BOOST PLUS PO LIQD
237.0000 mL | Freq: Three times a day (TID) | ORAL | Status: DC
Start: 2014-04-01 — End: 2014-04-04
  Filled 2014-04-01 (×14): qty 237

## 2014-04-01 MED ORDER — OXYCODONE HCL 5 MG PO TABS: 5.0000 mg | ORAL_TABLET | Freq: Four times a day (QID) | ORAL | Status: DC | PRN

## 2014-04-01 NOTE — Progress Notes (Signed)
Procedure(s) (LRB): THORACIC AORTIC ENDOVASCULAR STENT GRAFT (N/A) Subjective:  Feels a little better today. Still has some back pain but mild. Still some cough. No shortness of breath. Ate some today.  Objective: Vital signs in last 24 hours: Temp:  [97.2 F (36.2 C)-98.6 F (37 C)] 97.2 F (36.2 C) (03/21 1235) Pulse Rate:  [80-88] 80 (03/21 1235) Cardiac Rhythm:  [-]  Resp:  [16-20] 16 (03/21 1235) BP: (127-154)/(59-78) 146/77 mmHg (03/21 1235) SpO2:  [91 %-96 %] 96 % (03/21 1235) Weight:  [60.783 kg (134 lb)] 60.783 kg (134 lb) (03/21 0400)  Hemodynamic parameters for last 24 hours:    Intake/Output from previous day: 03/20 0701 - 03/21 0700 In: 120 [P.O.:120] Out: 900 [Urine:900] Intake/Output this shift: Total I/O In: 0  Out: 200 [Urine:200]  General appearance: alert and cooperative Neurologic: intact Heart: regular rate and rhythm, S1, S2 normal, no murmur, click, rub or gallop Lungs: clear to auscultation bilaterally Abdomen: soft, non-tender; bowel sounds normal; no masses,  no organomegaly Extremities: edema mild in ankles  Lab Results:  Recent Labs  03/31/14 0425 04/01/14 0430  WBC 8.4 8.8  HGB 9.1* 9.7*  HCT 28.9* 30.5*  PLT 204 246   BMET:  Recent Labs  03/31/14 0425 04/01/14 0430  NA 136 137  K 3.7 3.6  CL 105 103  CO2 25 27  GLUCOSE 126* 98  BUN 12 7  CREATININE 0.49* 0.53  CALCIUM 8.1* 8.6    PT/INR: No results for input(s): LABPROT, INR in the last 72 hours. ABG No results found for: PHART, HCO3, TCO2, ACIDBASEDEF, O2SAT CBG (last 3)  No results for input(s): GLUCAP in the last 72 hours.  Assessment/Plan:  1. Penetrating descending aortic ulcer with intramural hematoma or focal dissection. Discussed with Dr. Myra GianottiBrabham and we will plan TEVAR on Thursday. Discussed the procedure, alternatives, benefits and risks with her and her daughter and they are in agreement.  2. Venous duplex shows DVT in the right popliteal and peroneal  veins with the thrombus in the popliteal vein unattached to the vessel walls. Occlusive thrombus i the peroneal vein. No DVT on the left. Dr. Myra GianottiBrabham will plan IVC filter tomorrow. Will let her eat breakfast and then NPO for procedure in the afternoon.   LOS: 2 days   3.  UTI by UA. Culture not sent. Being treated. Afebrile with normal WBC ct. Alleen BorneBryan K Araiyah Cumpton 04/01/2014

## 2014-04-01 NOTE — Progress Notes (Signed)
Name: West CarboJanice Rodkey MRN: [id number] DOB: 04/01/1935    ADMISSION DATE:  03/30/2014  REFERRING MD :  EDP  CHIEF COMPLAINT:  Aortic dissection   BRIEF PATIENT DESCRIPTION:   79yo female with minimal PMH presented 3/19 to ER with severe low back pain and 3 week viral illness including fever, diarrhea, dry cough.  CT revealed thoracic aortic dissection and PCCM called to admit.    SIGNIFICANT EVENTS    STUDIES:  CT chest 3/19 > descending thoracic aortic dissection of uncertain chronicity. Increased attenuation is noted within the false lumen which may represent some acute change. Bibasilar fibrotic changes. 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 There is no hemorrhage into the mediastinum or pleural space. There was also a nonocclusive PE seen in the superior segment of the RLL and within the RLL segmental and subsegmental branches. Also coronary artery calcification  SUBJECTIVE:  Pain somewhat better, but she does not like the way morphine makes her feel. Also does not like fentanyl. She has had poor nutrition due to appetite and nausea. Cough is worse today and more productive of white sputum.   VITAL SIGNS: Temp:  [98.3 F (36.8 C)-98.6 F (37 C)] 98.5 F (36.9 C) (03/21 0816) Pulse Rate:  [73-88] 88 (03/21 0816) Resp:  [18-20] 19 (03/21 0816) BP: (127-154)/(59-78) 140/77 mmHg (03/21 0816) SpO2:  [91 %-96 %] 95 % (03/21 0816) Weight:  [60.783 kg (134 lb)] 60.783 kg (134 lb) (03/21 0400)  PHYSICAL EXAMINATION:  General:  Thin, pleasant female Neuro:  Awake, alert, appropriate, MAE  HEENT:  Mm moist, no JVD  Cardiovascular:  s1s2 rrr  Lungs:  resps even, non labored on [place], cta. Wet sounding cough.  Abdomen:  Round, soft, non tender, +bs  Musculoskeletal:  Warm and dry, no edema    Recent Labs Lab  03/30/14 0718 03/31/14 0425 04/01/14 0430  NA 139 136 137  K 3.5 3.7 3.6  CL 104 105 103  CO2 28 25 27   BUN 15 12 7   CREATININE 0.64 0.49* 0.53  GLUCOSE 140* 126* 98    Recent Labs Lab 03/30/14 0718 03/31/14 0425 04/01/14 0430  HGB 11.3* 9.1* 9.7*  HCT 35.5* 28.9* 30.5*  WBC 12.9* 8.4 8.8  PLT 315 204 246   Ct Angio Chest Aorta W/cm &/or Wo/cm  03/31/2014   CLINICAL DATA:  79 year old female with back pain, fever and jaw pain for the past several weeks. Possible aortic dissection.  EXAM: CT ANGIOGRAPHY CHEST WITH CONTRAST  TECHNIQUE: Multidetector CT imaging of the chest was performed using the standard protocol during bolus administration of intravenous contrast. Multiplanar CT image reconstructions and MIPs were obtained to evaluate the vascular anatomy.  CONTRAST:  100mL OMNIPAQUE IOHEXOL 350 MG/ML SOLN  COMPARISON:  CT scan of the chest, abdomen and pelvis performed without intravenous contrast 03/30/2014  FINDINGS: Mediastinum: Unremarkable CT appearance of the thyroid gland. No suspicious mediastinal or hilar adenopathy. No soft tissue mediastinal mass. The thoracic esophagus is unremarkable.  Heart/Vascular: The initial noncontrast images, crescentic high attenuation material is noted beginning in the distal arch just after the origin of the left subclavian artery and extending inferiorly along the descending thoracic aorta consistent with acute intramural hematoma. On the contrasted images, there is focal disruption of the descending thoracic aortic intima at approximately the 2 o'clock position (image 64 series  501) with extension of contrast material into the media both proximally and distally. These findings are most consistent with progression of a penetrating aortic ulcer into acute focal dissection an propagation of intramural hematoma. The dissection flap does not extend into the great vessels or into the abdominal aorta although the intramural hematoma does extend into the  upper abdominal aorta.  The tubular portion of the ascending thoracic aorta is ectatic at approximately 3.5 cm. Motion slightly limits evaluation. Cardiomegaly with left ventricular dilatation. Atherosclerotic calcifications present throughout the coronary arteries. Although this study was not optimized for opacifications pulmonary arteries, nonocclusive a central filling defects can be identified within segmental branches of the superior segment of the right lower lobe and within the right lower lobe branches extending into segmental and subsegmental branches distally. No evidence of right heart strain, the RV/LV ratio is normal.  Lungs/Pleura: Mild interlobular septal thickening in the upper lungs consistent with early interstitial edema. There are small layering bilateral pleural effusions as well as a mild bibasilar atelectasis. No focal airspace consolidation. Diffuse mild lower lobe bronchial wall thickening.  Bones/Soft Tissues: No acute fracture or aggressive appearing lytic or blastic osseous lesion.  Upper Abdomen: Extension of intramural hematoma into the visualized abdominal aortic aneurysm. No dissection flap.  Review of the MIP images confirms the above findings.  IMPRESSION: 1. Findings are most consistent with progression of a penetrating aortic ulcer into acute focal dissection in the descending thoracic aorta at the level of the left main pulmonary artery with propagation of intramural hematoma proximally to the origin of the left subclavian artery an distally into the upper abdominal aorta. The focal dissection flap remains focal and localized in the descending thoracic aorta. No evidence of rupture into the pleural space or mediastinum at this time. No significant interval change comparing to the noncontrast study from yesterday. Findings would likely be amenable to endovascular repair. Recommend consultation (urgent, but not emergent) with cardiothoracic and vascular surgery. 2. Positive for  small to moderate burden nonocclusive acute to subacute pulmonary embolus in the lobar, segmental and subsegmental branches of the right lower lobe pulmonary artery. There is no evidence of associated right heart strain. 3. Cardiomegaly with left ventricular dilatation. 4. Atherosclerosis including coronary artery calcification. 5. Probable very mild early interstitial edema in the upper lungs. 6. Small bilateral layering pleural effusions.  Critical Value/emergent results were called by telephone at the time of interpretation on 03/31/2014 at 3:48 pm to Dr. Arsenio Loader , who verbally acknowledged these results.  Signed,  Sterling Big, MD  [facility]  [facility]   Electronically Signed   By: Malachy Moan M.D.   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 HTN  CAD on CTA Abnormal ECG (ST depression in inferior and lateral leads, not new, but worse than prior) Chest/back pain with extension to jaw - PRN morphine for pain  - SBP goal 100-120 mm/Hg - Start scheduled/PRN  IV metoprolol - DC PRN hydralazine - TCTS following, discussing with vascular for possible endovascular stenting - Consider cardiology consultation  R non-occlusive pulmonary embolism URI/bronchitis  UTI  - hold anticoagulation in setting penetrating aortic ulcer - f/u BLE dopplers, consider filter if large/mobile clot burden - Cont IV rocephin for UTI +/- purulent bronchitis - Add azithromycin due to worsening productive cough - Repeat CXR in AM  Nutrition - has had very limited intake for past 3 weeks. (nausea +  poor appetite) - Continue PRN zofran - Add lactose free Boost supplement  Joneen Roach, AGACNP-BC [place] [facility] Pager [phone] or [phone]   Reviewed above, examined.  She has cough, but trouble getting sputum up.  Feels weak.  Scattered rhonchi, abd soft,  no edema.  Continue BP/HR control.  No anticoagulation for PE in setting of type B aortic dissection.  F/u doppler of legs and decide about need for IVC filter >> hold SCD's for now.  Continue Abx for UTI/bronchitis.  Updated pt's family at bedside.  Will ask [facility] to assume care from 3/22 and PCCM sign off.  Coralyn Helling, MD [facility]monary/Critical Care 04/01/2014, 12:37 PM Pager:  [phone] After 3pm call: [phone]

## 2014-04-01 NOTE — Progress Notes (Addendum)
eLink Physician-Brief Progress Note Patient Name: Stephanie CarboJanice Collier DOB: January 30, 1935 MRN: [id number]   Date of Service  04/01/2014  HPI/Events of Note  Pain Fentanyl and Morphine made her nauseated  eICU Interventions  Oxycodone prn Morphine only for breakthrough pain, give with zofran     Intervention Category Intermediate Interventions: Pain - evaluation and management  Xayla Puzio 04/01/2014, 8:54 PM

## 2014-04-01 NOTE — Progress Notes (Signed)
Pharmacy note: azithromycin  79 yo female with possible CAP/UTI on rocephin and pharmacy consulted to dose azithromycin. WBC= 8.8, afebrile, and cultures NGTD.  3/19 rocephin 3/19 azithromycin 500mg  IV x1  Plan -Azithromycin 500mg  IV q24hr -Will sign off for now, please contact pharmacy with any other needs  Thank you, Harland Germanndrew Calysta Craigo, Pharm D 04/01/2014 11:52 AM

## 2014-04-01 NOTE — Progress Notes (Signed)
VASCULAR LAB PRELIMINARY  PRELIMINARY  PRELIMINARY  PRELIMINARY  Bilateral lower extremity venous duplex  completed.    Preliminary report:  Right:  DVT noted in the popliteal vein and peroneal vein. In the popliteal vein the thrombus is unattached to the vessel walls.  Occlusive thrombus in the peroneal vein. No evidence of superficial thrombosis.  No Baker's cyst.  Left:  No evidence of DVT, superficial thrombosis, or Baker's cyst.  Dmoni Fortson, RVT 04/01/2014, 2:01 PM

## 2014-04-02 ENCOUNTER — Inpatient Hospital Stay (HOSPITAL_COMMUNITY): Payer: Medicare Other

## 2014-04-02 ENCOUNTER — Encounter (HOSPITAL_COMMUNITY): Payer: Self-pay | Admitting: Surgery

## 2014-04-02 ENCOUNTER — Encounter (HOSPITAL_COMMUNITY)
Admission: EM | Disposition: A | Discharge status: Home / Self Care | Payer: Self-pay | Source: Home / Self Care | Attending: Surgery

## 2014-04-02 DIAGNOSIS — I712 Thoracic aortic aneurysm, without rupture: Secondary | ICD-10-CM

## 2014-04-02 HISTORY — PX: INSERTION OF VENA CAVA FILTER: SHX5513

## 2014-04-02 LAB — CBC
HCT: 29.9 % — ABNORMAL LOW (ref 36.0–46.0)
Hemoglobin: 9.7 g/dL — ABNORMAL LOW (ref 12.0–15.0)
MCH: 30.6 pg (ref 26.0–34.0)
MCHC: 32.4 g/dL (ref 30.0–36.0)
MCV: 94.3 fL (ref 78.0–100.0)
PLATELETS: 252 10*3/uL (ref 150–400)
RBC: 3.17 MIL/uL — AB (ref 3.87–5.11)
RDW: 14 % (ref 11.5–15.5)
WBC: 6.7 10*3/uL (ref 4.0–10.5)

## 2014-04-02 LAB — BASIC METABOLIC PANEL
Anion gap: 9 (ref 5–15)
BUN: 13 mg/dL (ref 6–23)
CHLORIDE: 100 mmol/L (ref 96–112)
CO2: 28 mmol/L (ref 19–32)
CREATININE: 0.53 mg/dL (ref 0.50–1.10)
Calcium: 8.4 mg/dL (ref 8.4–10.5)
GFR calc non Af Amer: 90 mL/min — ABNORMAL LOW (ref >90)
Glucose, Bld: 85 mg/dL (ref 70–99)
Potassium: 3.6 mmol/L (ref 3.5–5.1)
Sodium: 137 mmol/L (ref 135–145)

## 2014-04-02 SURGERY — INSERTION OF VENA CAVA FILTER
Anesthesia: LOCAL

## 2014-04-02 MED ORDER — ACETAMINOPHEN 325 MG PO TABS: 650.0000 mg | ORAL_TABLET | Freq: Four times a day (QID) | ORAL | Status: DC | PRN

## 2014-04-02 MED ORDER — SODIUM CHLORIDE 0.9 % IV SOLN: INTRAVENOUS | Status: DC

## 2014-04-02 MED ORDER — GUAIFENESIN ER 600 MG PO TB12
1200.0000 mg | ORAL_TABLET | Freq: Two times a day (BID) | ORAL | Status: DC
  Administered 2014-04-02: 1200 mg via ORAL
  Administered 2014-04-03: 600 mg via ORAL
  Filled 2014-04-02: qty 2

## 2014-04-02 MED ORDER — LIDOCAINE HCL (PF) 1 % IJ SOLN
INTRAMUSCULAR | Status: AC
  Filled 2014-04-02: qty 30

## 2014-04-02 MED ORDER — HEPARIN (PORCINE) IN NACL 2-0.9 UNIT/ML-% IJ SOLN
INTRAMUSCULAR | Status: AC
  Filled 2014-04-02: qty 500

## 2014-04-02 NOTE — Progress Notes (Signed)
Patient requesting pain medication, RN administered.  Patient stated "I think I told you to go away earlier and I'm sorry".  RN stated it was ok and asked patient if she was able to get some rest earlier and patient replied "a little bit and I'm glad I did".  RN asked patient if RN could complete focused physical assessment at this time.  Patient allowed RN to complete.

## 2014-04-02 NOTE — Progress Notes (Signed)
RN into complete q4h focused assessment.  Patient stated "go away, I'm fine".  RN explained why she was at the bedside and patient stated "I just want to rest, leave me alone."  Patient did answer two questions for RN at this time.  Per patient pain 2/10 currently and per patient is comfortable at this time and patient denies nausea.

## 2014-04-02 NOTE — Op Note (Signed)
    Patient name: Stephanie Collier MRN: [id number] DOB: 10-10-1935 Sex: female  03/30/2014 - 04/02/2014 Pre-operative Diagnosis: DVT and PE Post-operative diagnosis:  Same Surgeon:  Jorge NyBRABHAM IV, V. WELLS Procedure Performed:  1.  Ultrasound-guided access, right femoral vein  2.  Inferior venacavogram  3.  Placement of a retrievable Cook Celect IVC filter   Indications:  The patient was admitted with a penetrating thoracic aortic ulcer.  On CT scan imaging she was found to have a PE.  Duplex ultrasound revealed right lower extremity DVT.  She is not a candidate for anticoagulation given her intramural hematoma associated with her thoracic PAU.  She is here today for filter  Procedure:  The patient was identified in the holding area and taken to room 8.  The patient was then placed supine on the table and prepped and draped in the usual sterile fashion.  A time out was called.  Ultrasound was used to evaluate the right common femoral vein.  It was patent and easily compressible.  The right common femoral vein was then cannulated under ultrasound guidance with a 18-gauge needle.  A 035 wire was advanced into the inferior vena cava.  A marked pigtail catheter was advanced into the inferior vena cava and inferior venacavogram was performed.  Findings:   Venacavogram: Normal anatomy was visualized.  There is tortuosity within the vena cava.  Maximum diameter below the renal veins was 21 mm.  Intervention:  After the above images were acquired, the catheter was removed and the filter introducing sheath was inserted.  The filter was prepared on the back table and then inserted through the sheath.  It was deployed with the top of the device landing at the mid L2 vertebral body.  The filter is somewhat angulated given the tortuosity of the vena cava.  There were no immediate complications.  The sheath was removed and pressure was held.  Impression:  #1  successful placement of inferior vena cava filter    V.  Durene CalWells Brabham, M.D. [facility] [place] Office: [phone] Pager:  [phone]

## 2014-04-02 NOTE — Progress Notes (Signed)
Day of Surgery Procedure(s) (LRB): INSERTION OF VENA CAVA FILTER (N/A) Subjective:  Still has cough. Back pain better. Had IVC filter today.  Objective: Vital signs in last 24 hours: Temp:  [97.9 F (36.6 C)-99.1 F (37.3 C)] 98.5 F (36.9 C) (03/22 0741) Pulse Rate:  [65-80] 65 (03/22 1311) Cardiac Rhythm:  [-] Normal sinus rhythm (03/22 0554) Resp:  [18] 18 (03/22 0741) BP: (112-147)/(56-75) 112/56 mmHg (03/22 1311) SpO2:  [90 %-94 %] 92 % (03/22 1311) Weight:  [60.646 kg (133 lb 11.2 oz)] 60.646 kg (133 lb 11.2 oz) (03/22 0420)  Hemodynamic parameters for last 24 hours:    Intake/Output from previous day: 03/21 0701 - 03/22 0700 In: 860 [P.O.:740; I.V.:120] Out: 300 [Urine:300] Intake/Output this shift:    General appearance: alert and cooperative, rattling cough Neurologic: intact Heart: regular rate and rhythm, S1, S2 normal, no murmur, click, rub or gallop Lungs: coarse breath sounds Abdomen: soft, non-tender; bowel sounds normal; no masses,  no organomegaly Extremities: extremities normal, atraumatic, no cyanosis or edema  Lab Results:  Recent Labs  04/01/14 0430 04/02/14 0432  WBC 8.8 6.7  HGB 9.7* 9.7*  HCT 30.5* 29.9*  PLT 246 252   BMET:  Recent Labs  04/01/14 0430 04/02/14 0432  NA 137 137  K 3.6 3.6  CL 103 100  CO2 27 28  GLUCOSE 98 85  BUN 7 13  CREATININE 0.53 0.53  CALCIUM 8.6 8.4    PT/INR: No results for input(s): LABPROT, INR in the last 72 hours. ABG No results found for: PHART, HCO3, TCO2, ACIDBASEDEF, O2SAT CBG (last 3)  No results for input(s): GLUCAP in the last 72 hours.   CLINICAL DATA: Cough and chest congestion and shortness of breath.  EXAM: PORTABLE CHEST - 1 VIEW  COMPARISON: 03/30/2014 and CT scan dated 03/31/2014  FINDINGS: Cardiomegaly with tortuosity of the thoracic aorta. Increasing moderate left pleural effusion. Tiny right effusion. Pulmonary vascularity is normal. Chronic accentuation of the  interstitial markings.  IMPRESSION: Increasing moderate left pleural effusion.   Electronically Signed  By: Francene BoyersJames Maxwell M.D.  On: 04/02/2014 07:10   Assessment/Plan:  1. Penetrating descending aortic ulcer with intramural hematoma or focal dissection. Discussed with Dr. Myra GianottiBrabham and we will plan TEVAR on Thursday. Discussed the procedure , alternatives, benefits and risks again withwith her and both of her daughters including but not limited to bleeding, blood transfusion, infection, vascular injury, paresis or paralysis, death and they are in agreement.  2. Venous duplex shows DVT in the right popliteal and peroneal veins with the thrombus in the popliteal vein unattached to the vessel walls. Occlusive thrombus i the peroneal vein. No DVT on the left. Dr. Myra GianottiBrabham inserted IVC filter today.  3. URI/bronchitis: she remain on Ceftriaxone but still has the same cough.  4. UTI: on Ceftriaxone.  LOS: 3 days    Alleen BorneBryan K Donn Zanetti 04/02/2014

## 2014-04-02 NOTE — Consult Note (Signed)
Consult Note  Patient name: Stephanie Collier MRN: [id number] DOB: Feb 20, 1935 Sex: female  Consulting Physician:  Dr. Laneta Simmers  Reason for Consult:  Chief Complaint  Patient presents with  . Back Pain  . Jaw Pain    HISTORY OF PRESENT ILLNESS: This is a 79 year old female that I was asked to evaluate given a penetrating ulcer with intramural hematoma within the descending thoracic aorta.  The patient initially presented to the emergency department on 03/18/2014 with a viral illness.  She returned with continued cough and shortness of breath as well as pleuritic chest pain.  She began complaining of back pain which radiated to her jaw.  A noncontrast CT scan showed a type B aortic dissection.  She was admitted to the hospital with a UTI and pruritic pain.  A CT and Cheree Ditto was eventually performed which revealed a penetrating ulcer in the mid descending thoracic aorta with intramural hematoma.  She was also found to have a nonocclusive PE in the upper segment of the right lower lobe and subsegmental branches.  Duplex ultrasound was eventually performed which revealed acute DVT in the right popliteal and peroneal vein.  The patient is a nonsmoker.  Past Medical History  Diagnosis Date  . Osteoporosis   . Scoliosis   . Raynaud's disease     Past Surgical History  Procedure Laterality Date  . Appendectomy      at age 89    History   Social History  . Marital Status: Significant Other    Spouse Name: N/A  . Number of Children: N/A  . Years of Education: N/A   Occupational History  . Not on file.   Social History Main Topics  . Smoking status: Never Smoker   . Smokeless tobacco: Not on file  . Alcohol Use: No  . Drug Use: No  . Sexual Activity: Not on file   Other Topics Concern  . Not on file   Social History Narrative    No family history on file.  Allergies as of 03/30/2014  . (No Known Allergies)    No current facility-administered medications on file prior  to encounter.   No current outpatient prescriptions on file prior to encounter.     REVIEW OF SYSTEMS: Constitutional: Positive for weight loss and malaise/fatigue. Negative for fever and chills.  Eyes: Negative.  Respiratory: Positive for cough, sputum production, shortness of breath and wheezing. Negative for hemoptysis.  Cardiovascular: Positive for leg swelling. Negative for orthopnea and PND.   Left back pain  Gastrointestinal: Negative. Negative for nausea, vomiting, abdominal pain and diarrhea.  Genitourinary: Negative.  Musculoskeletal: Positive for back pain.  Skin: Negative.  Neurological: Positive for headaches.  Endo/Heme/Allergies: Negative.  Psychiatric/Behavioral: Negative.  PHYSICAL EXAMINATION: General: The patient appears their stated age.  Vital signs are BP 147/75 mmHg  Pulse 74  Temp(Src) 98.5 F (36.9 C) (Oral)  Resp 18  Ht  (1.575 m)  Wt 133 lb 11.2 oz (60.646 kg)  BMI 24.45 kg/m2  SpO2 90% Pulmonary: Respirations are non-labored, but coarse breath sounds with productive cough HEENT:  No gross abnormalities Abdomen: Soft and non-tender  Musculoskeletal: There are no major deformities.   Neurologic: No focal weakness or paresthesias are detected, Skin: There are no ulcer or rashes noted. Psychiatric: The patient has normal affect. Cardiovascular: There is a regular rate and rhythm without significant murmur appreciated.  Palpable pedal pulses bilaterally  Diagnostic Studies: I have reviewed the patient's CT  scan with the following findings: 1. Findings are most consistent with progression of a penetrating aortic ulcer into acute focal dissection in the descending thoracic aorta at the level of the left main pulmonary artery with propagation of intramural hematoma proximally to the origin of the left subclavian artery an distally into the upper abdominal aorta. The focal dissection flap remains focal and localized in the descending  thoracic aorta. No evidence of rupture into the pleural space or mediastinum at this time. No significant interval change comparing to the noncontrast study from yesterday. Findings would likely be amenable to endovascular repair. Recommend consultation (urgent, but not emergent) with cardiothoracic and vascular surgery. 2. Positive for small to moderate burden nonocclusive acute to subacute pulmonary embolus in the lobar, segmental and subsegmental branches of the right lower lobe pulmonary artery. There is no evidence of associated right heart strain. 3. Cardiomegaly with left ventricular dilatation. 4. Atherosclerosis including coronary artery calcification. 5. Probable very mild early interstitial edema in the upper lungs. 6. Small bilateral layering pleural effusions.   Assessment:  #1: Penetrating descending thoracic aortic ulcer with intramural hematoma #2: Right lower extremity DVT with PE Plan: #1: The patient will be scheduled for endovascular repair of her descending thoracic penetrating ulcer.  This will be performed Thursday.  I discussed the risks and benefits of the operation with the patient as well as her family.  We discussed the risk of cardiopulmonary complications, stroke, bleeding, and the risk of paralysis. #2: The patient is not a candidate for anticoagulation given her penetrating ulcer and intramural hematoma.  She has acute DVT in the right leg.  Therefore I have recommended placement of a hand.  Vena cava filter.  I discussed that this could be removed at a later date.  This will be performed today.     Jorge NyV. Wells Brenlynn Fake IV, M.D. [facility] [place] Office: [phone] Pager:  [phone]

## 2014-04-02 NOTE — Progress Notes (Signed)
[facility] PROGRESS NOTE   Stephanie Collier ZOX:[id number] DOB: 02-Jul-1935 DOA: 03/30/2014 PCP: Pcp Not In System   Subjective: Seen with daughter at bedside, denies any pain, fever or chills. Has some cough without sputum production.  HPI: 79 year old with past medical history of hypertension admitted to the ICU on 03/30/2014 with chest pain. Imaging showed right-sided PE. PE, dissecting aortic aneurysm, she also has UTI and possible acute bronchitis, right-sided DVT. CTS and vascular surgery recommended endovascular repair of the aortic dissection/aortic ulcer. Also recommended no heparin for now, IVC filter placed on 3/22. Patient will undergo endovascular repair of the aortic dissection/aortic ulcer on Thursday.  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 Keep blood pressure controlled. IV heparin is contraindicated. Patient will have surgery on Thursday.  PE/DVT CT showed small to moderate burden PE in the artery to the right lower lobe. Doppler ultrasound showed right lower extremity DVT. Per vascular surgery anticoagulation is contraindicated. IVC retrievable filter placed on 04/02/2014.  Acute bronchitis Patient is on IV Rocephin, azithromycin, Mucinex. Use incentive spirometry.  UTI Patient is on Rocephin, no urine cultures but blood cultures being negative so far.   Code Status: Full Code Family Communication: Plan discussed with the patient. Disposition Plan: Remains inpatient Diet: Diet NPO time specified  Consultant  Was in the PCCM till yesterday, [facility] assumed care on 3/20 03/02/2014.  Cardiothoracic surgery.  Vascular surgery  Procedures:   Placement of  retrievable IVC filter on 04/02/2014 by Dr. Myra Gianotti  Antibiotics:  Rocephin and azithromycin    Objective: Filed Vitals:   04/02/14 0741  BP: 147/75  Pulse: 74  Temp: 98.5 F (36.9 C)  Resp: 18    Intake/Output Summary (Last 24 hours) at 04/02/14 1309 Last data filed at 04/02/14 0700  Gross per 24 hour  Intake    660 ml  Output    100 ml  Net    560 ml   Filed Weights   03/31/14 0410 04/01/14 0400 04/02/14 0420  Weight: 60.782 kg (134 lb) 60.783 kg (134 lb) 60.646 kg (133 lb 11.2 oz)    Exam: General: Alert and awake, oriented x3, not in any acute distress. HEENT: anicteric sclera, pupils reactive to light and accommodation, EOMI CVS: S1-S2 clear, no murmur rubs or gallops Chest: clear to auscultation bilaterally, no wheezing, rales or rhonchi Abdomen: soft nontender, nondistended, normal bowel sounds, no organomegaly Extremities: no cyanosis, clubbing or edema noted bilaterally Neuro: Cranial nerves II-XII intact, no focal neurological deficits  Data Reviewed: Basic Metabolic Panel:  Recent Labs Lab 03/30/14 0718 03/31/14 0425 04/01/14 0430 04/02/14 0432  NA 139 136 137 137  K 3.5 3.7 3.6 3.6  CL 104 105 103 100  CO2 GLUCOSE 140* 126* 98 85  BUN CREATININE 0.64 0.49* 0.53 0.53  CALCIUM 8.5 8.1* 8.6 8.4   Liver Function Tests:  Recent Labs Lab 03/30/14 0718  AST 38*  ALT 27  ALKPHOS 64  BILITOT 0.8  PROT 7.2  ALBUMIN 2.9*    Recent Labs Lab 03/30/14 0718  LIPASE 26   No results for input(s): AMMONIA in the last 168 hours. CBC:  Recent Labs Lab 03/30/14 0718 03/31/14 0425 04/01/14 0430 04/02/14 0432  WBC 12.9*  8.4 8.8 6.7  NEUTROABS  --   --  6.4  --   HGB 11.3* 9.1* 9.7* 9.7*  HCT 35.5* 28.9* 30.5* 29.9*  MCV 94.9 96.3 94.7 94.3  PLT 315 204 246 252   Cardiac Enzymes: No results for input(s): CKTOTAL, CKMB, CKMBINDEX, TROPONINI in the last 168 hours. BNP (last 3 results)  Recent Labs   03/30/14 0718  BNP 68.2    ProBNP (last 3 results) No results for input(s): PROBNP in the last 8760 hours.  CBG: No results for input(s): GLUCAP in the last 168 hours.  Micro Recent Results (from the past 240 hour(s))  Culture, blood (routine x 2)     Status: None (Preliminary result)   Collection Time: 03/30/14  8:28 AM  Result Value Ref Range Status   Specimen Description BLOOD LEFT ARM  Final   Special Requests BOTTLES DRAWN AEROBIC AND ANAEROBIC 5CC  Final   Culture   Final           BLOOD CULTURE RECEIVED NO GROWTH TO DATE CULTURE WILL BE HELD FOR 5 DAYS BEFORE ISSUING A FINAL NEGATIVE REPORT Performed at [facility]    Report Status PENDING  Incomplete  Culture, blood (routine x 2)     Status: None (Preliminary result)   Collection Time: 03/30/14  8:49 AM  Result Value Ref Range Status   Specimen Description BLOOD RIGHT ARM  Final   Special Requests BOTTLES DRAWN AEROBIC AND ANAEROBIC 5CC  Final   Culture   Final           BLOOD CULTURE RECEIVED NO GROWTH TO DATE CULTURE WILL BE HELD FOR 5 DAYS BEFORE ISSUING A FINAL NEGATIVE REPORT Performed at [facility]    Report Status PENDING  Incomplete  MRSA PCR Screening     Status: None   Collection Time: 03/30/14  4:03 PM  Result Value Ref Range Status   MRSA by PCR NEGATIVE NEGATIVE Final    Comment:        The GeneXpert MRSA Assay (FDA approved for NASAL specimens only), is one component of a comprehensive MRSA colonization surveillance program. It is not intended to diagnose MRSA infection nor to guide or monitor treatment for MRSA infections.      Studies: Dg Chest Port 1 View  04/02/2014   CLINICAL DATA:  Cough and chest congestion and shortness of breath.  EXAM: PORTABLE CHEST - 1 VIEW  COMPARISON:  03/30/2014 and CT scan dated 03/31/2014  FINDINGS: Cardiomegaly with tortuosity of the thoracic aorta. Increasing moderate left pleural effusion. Tiny right effusion. Pulmonary vascularity is  normal. Chronic accentuation of the interstitial markings.  IMPRESSION: Increasing moderate left pleural effusion.   Electronically Signed   By: Francene Boyers M.D.   On: 04/02/2014 07:10   Ct Angio Chest Aorta W/cm &/or Wo/cm  03/31/2014   CLINICAL DATA:  79 year old female with back pain, fever and jaw pain for the past several weeks. Possible aortic dissection.  EXAM: CT ANGIOGRAPHY CHEST WITH CONTRAST  TECHNIQUE: Multidetector CT imaging of the chest was performed using the standard protocol during bolus administration of intravenous contrast. Multiplanar CT image reconstructions and MIPs were obtained to evaluate the vascular anatomy.  CONTRAST:  OMNIPAQUE IOHEXOL 350 MG/ML SOLN  COMPARISON:  CT scan of the chest, abdomen and pelvis performed without intravenous contrast 03/30/2014  FINDINGS: Mediastinum: Unremarkable CT appearance of the thyroid gland. No suspicious mediastinal or hilar adenopathy. No soft tissue mediastinal mass. The thoracic esophagus is  unremarkable.  Heart/Vascular: The initial noncontrast images, crescentic high attenuation material is noted beginning in the distal arch just after the origin of the left subclavian artery and extending inferiorly along the descending thoracic aorta consistent with acute intramural hematoma. On the contrasted images, there is focal disruption of the descending thoracic aortic intima at approximately the 2 o'clock position (image 64 series 501) with extension of contrast material into the media both proximally and distally. These findings are most consistent with progression of a penetrating aortic ulcer into acute focal dissection an propagation of intramural hematoma. The dissection flap does not extend into the great vessels or into the abdominal aorta although the intramural hematoma does extend into the upper abdominal aorta.  The tubular portion of the ascending thoracic aorta is ectatic at approximately 3.5 cm. Motion slightly limits  evaluation. Cardiomegaly with left ventricular dilatation. Atherosclerotic calcifications present throughout the coronary arteries. Although this study was not optimized for opacifications pulmonary arteries, nonocclusive a central filling defects can be identified within segmental branches of the superior segment of the right lower lobe and within the right lower lobe branches extending into segmental and subsegmental branches distally. No evidence of right heart strain, the RV/LV ratio is normal.  Lungs/Pleura: Mild interlobular septal thickening in the upper lungs consistent with early interstitial edema. There are small layering bilateral pleural effusions as well as a mild bibasilar atelectasis. No focal airspace consolidation. Diffuse mild lower lobe bronchial wall thickening.  Bones/Soft Tissues: No acute fracture or aggressive appearing lytic or blastic osseous lesion.  Upper Abdomen: Extension of intramural hematoma into the visualized abdominal aortic aneurysm. No dissection flap.  Review of the MIP images confirms the above findings.  IMPRESSION: 1. Findings are most consistent with progression of a penetrating aortic ulcer into acute focal dissection in the descending thoracic aorta at the level of the left main pulmonary artery with propagation of intramural hematoma proximally to the origin of the left subclavian artery an distally into the upper abdominal aorta. The focal dissection flap remains focal and localized in the descending thoracic aorta. No evidence of rupture into the pleural space or mediastinum at this time. No significant interval change comparing to the noncontrast study from yesterday. Findings would likely be amenable to endovascular repair. Recommend consultation (urgent, but not emergent) with cardiothoracic and vascular surgery. 2. Positive for small to moderate burden nonocclusive acute to subacute pulmonary embolus in the lobar, segmental and subsegmental branches of the right  lower lobe pulmonary artery. There is no evidence of associated right heart strain. 3. Cardiomegaly with left ventricular dilatation. 4. Atherosclerosis including coronary artery calcification. 5. Probable very mild early interstitial edema in the upper lungs. 6. Small bilateral layering pleural effusions.  Critical Value/emergent results were called by telephone at the time of interpretation on 03/31/2014 at 3:48 pm to Dr. Arsenio LoaderSommer , who verbally acknowledged these results.  Signed,  Sterling BigHeath K. McCullough, MD  [facility]  [facility]   Electronically Signed   By: Malachy MoanHeath  McCullough M.D.   On: 03/31/2014 15:48    Scheduled Meds: . antiseptic oral rinse  7 mL Mouth Rinse BID  . cefTRIAXone (ROCEPHIN)  IV  1 g Intravenous Q24H  . dextromethorphan-guaiFENesin  1 tablet Oral BID  . lactose free nutrition  237 mL Oral TID WC  . metoprolol tartrate  50 mg Oral Q12H  . pantoprazole  40 mg Oral Daily   Continuous Infusions: . sodium chloride 10 mL/hr (03/31/14 1215)  Time spent: 35 minutes    [facility]  [facility] Pager [phone] If 7PM-7AM, please contact night-coverage at [email or web address], password [facility] 04/02/2014, 1:09 PM  LOS: 3 days

## 2014-04-03 LAB — BASIC METABOLIC PANEL
ANION GAP: 5 (ref 5–15)
BUN: 14 mg/dL (ref 6–23)
CALCIUM: 8 mg/dL — AB (ref 8.4–10.5)
CHLORIDE: 102 mmol/L (ref 96–112)
CO2: 29 mmol/L (ref 19–32)
Creatinine, Ser: 0.48 mg/dL — ABNORMAL LOW (ref 0.50–1.10)
GFR calc Af Amer: >90 mL/min (ref >90)
GFR calc non Af Amer: >90 mL/min (ref >90)
Glucose, Bld: 110 mg/dL — ABNORMAL HIGH (ref 70–99)
Potassium: 3.6 mmol/L (ref 3.5–5.1)
SODIUM: 136 mmol/L (ref 135–145)

## 2014-04-03 LAB — CBC
HCT: 29.4 % — ABNORMAL LOW (ref 36.0–46.0)
Hemoglobin: 9.2 g/dL — ABNORMAL LOW (ref 12.0–15.0)
MCH: 29.4 pg (ref 26.0–34.0)
MCHC: 31.3 g/dL (ref 30.0–36.0)
MCV: 93.9 fL (ref 78.0–100.0)
Platelets: 269 10*3/uL (ref 150–400)
RBC: 3.13 MIL/uL — ABNORMAL LOW (ref 3.87–5.11)
RDW: 14.1 % (ref 11.5–15.5)
WBC: 7 10*3/uL (ref 4.0–10.5)

## 2014-04-03 LAB — CLOSTRIDIUM DIFFICILE BY PCR: Toxigenic C. Difficile by PCR: NEGATIVE

## 2014-04-03 LAB — URINALYSIS, ROUTINE W REFLEX MICROSCOPIC
BILIRUBIN URINE: NEGATIVE
Glucose, UA: NEGATIVE mg/dL
Ketones, ur: NEGATIVE mg/dL
Nitrite: NEGATIVE
PH: 5.5 (ref 5.0–8.0)
Protein, ur: NEGATIVE mg/dL
SPECIFIC GRAVITY, URINE: 1.029 (ref 1.005–1.030)
Urobilinogen, UA: 0.2 mg/dL (ref 0.0–1.0)

## 2014-04-03 LAB — URINE MICROSCOPIC-ADD ON

## 2014-04-03 MED ORDER — GUAIFENESIN ER 600 MG PO TB12
600.0000 mg | ORAL_TABLET | Freq: Two times a day (BID) | ORAL | Status: DC
  Filled 2014-04-03: qty 1

## 2014-04-03 MED ORDER — METOPROLOL TARTRATE 1 MG/ML IV SOLN
2.5000 mg | Freq: Once | INTRAVENOUS | Status: AC
  Administered 2014-04-03: 2.5 mg via INTRAVENOUS
  Filled 2014-04-03: qty 5

## 2014-04-03 NOTE — Progress Notes (Signed)
[facility] PROGRESS NOTE  Stephanie Collier ZOX:[id number] DOB: 19-Mar-1935 DOA: 03/30/2014 PCP: Pcp Not In System  Assessment/Plan: 1. Penetrating descending thoracic aorta also with intramural thrombus- patient has been seen by CT surgery as well as vascular surgery and plan is for endovascular repair on Thursday. Heparin is contraindicated at this time. 2. PE/DVT- patient CT chest showed small to moderate burden PE in the artery the right lower lobe. Doppler ultrasound showed right lower extremity DVT. Patient underwent IVC filter placement on 04/02/2014. Anticoagulation is contraindicated as per vascular surgery. 3. Acute bronchitis- improving, continue Rocephin, Zithromax, Mucinex. 4. UTI- patient started on Rocephin. We'll follow the urine culture results. 5. Hypertension- continue metoprolol 50 mg twice a day  Code Status: Full code Family Communication: Discussed with patient's daughter at bedside Disposition Plan: To be decided   Consultants:  Under PC CM care dated 04/01/2014  [facility] resumed care on 04/02/2014  Cardiothoracic surgery  Vascular surgery  Procedures:  IVC filter placement on 04/02/2014  Antibiotics:  *Rocephin and Zithromax  HPI/Subjective: 79 year old female came to the ER with severe low back pain and three-week viral illness including diarrhea fever cough. CT chest revealed thoracic aortic dissection. Patient was admitted the ICU, under CCM care. Patient on transfer to the floor under [facility] hospitalist service This morning patient wants to cut down the dose of Mucinex to 600 twice a day, it was increased to 1200 twice a day last night. No other complaints  Objective: Filed Vitals:   04/03/14 1245  BP: 140/68  Pulse:   Temp:   Resp:     Intake/Output Summary (Last 24 hours) at 04/03/14 1421 Last data filed at 04/03/14 1300  Gross per 24 hour  Intake    620 ml  Output    250 ml  Net    370 ml   Filed Weights   04/01/14 0400  04/02/14 0420 04/03/14 0658  Weight: 60.783 kg (134 lb) 60.646 kg (133 lb 11.2 oz) 57.879 kg (127 lb 9.6 oz)    Exam:   General:  Appears in no acute distress  Cardiovascular: S1-S2 is regular  Respiratory: Clear to auscultation bilaterally  Abdomen: Soft, nontender no organomegaly  Musculoskeletal: No edema of the lower extremities noted   Data Reviewed: Basic Metabolic Panel:  Recent Labs Lab 03/30/14 0718 03/31/14 0425 04/01/14 0430 04/02/14 0432 04/03/14 0324  NA 139 136 137 137 136  K 3.5 3.7 3.6 3.6 3.6  CL 104 105 103 100 102  CO2 GLUCOSE 140* 126* 98 85 110*  BUN CREATININE 0.64 0.49* 0.53 0.53 0.48*  CALCIUM 8.5 8.1* 8.6 8.4 8.0*   Liver Function Tests:  Recent Labs Lab 03/30/14 0718  AST 38*  ALT 27  ALKPHOS 64  BILITOT 0.8  PROT 7.2  ALBUMIN 2.9*    Recent Labs Lab 03/30/14 0718  LIPASE 26   No results for input(s): AMMONIA in the last 168 hours. CBC:  Recent Labs Lab 03/30/14 0718 03/31/14 0425 04/01/14 0430 04/02/14 0432 04/03/14 0324  WBC 12.9* 8.4 8.8 6.7 7.0  NEUTROABS  --   --  6.4  --   --   HGB 11.3* 9.1* 9.7* 9.7* 9.2*  HCT 35.5* 28.9* 30.5* 29.9* 29.4*  MCV 94.9 96.3 94.7 94.3 93.9  PLT 315 204 246 252 269   Cardiac Enzymes: No results for input(s): CKTOTAL, CKMB, CKMBINDEX, TROPONINI in the last 168 hours. BNP (last 3 results)  Recent Labs  03/30/14 0718  BNP 68.2    ProBNP (last 3 results) No results for input(s): PROBNP in the last 8760 hours.  CBG: No results for input(s): GLUCAP in the last 168 hours.  Recent Results (from the past 240 hour(s))  Culture, blood (routine x 2)     Status: None (Preliminary result)   Collection Time: 03/30/14  8:28 AM  Result Value Ref Range Status   Specimen Description BLOOD LEFT ARM  Final   Special Requests BOTTLES DRAWN AEROBIC AND ANAEROBIC 5CC  Final   Culture   Final           BLOOD CULTURE RECEIVED NO GROWTH TO DATE CULTURE WILL  BE HELD FOR 5 DAYS BEFORE ISSUING A FINAL NEGATIVE REPORT [facility]    Report Status PENDING  Incomplete  Culture, blood (routine x 2)     Status: None (Preliminary result)   Collection Time: 03/30/14  8:49 AM  Result Value Ref Range Status   Specimen Description BLOOD RIGHT ARM  Final   Special Requests BOTTLES DRAWN AEROBIC AND ANAEROBIC 5CC  Final   Culture   Final           BLOOD CULTURE RECEIVED NO GROWTH TO DATE CULTURE WILL BE HELD FOR 5 DAYS BEFORE ISSUING A FINAL NEGATIVE REPORT Performed at [facility]    Report Status PENDING  Incomplete  MRSA PCR Screening     Status: None   Collection Time: 03/30/14  4:03 PM  Result Value Ref Range Status   MRSA by PCR NEGATIVE NEGATIVE Final    Comment:        The GeneXpert MRSA Assay (FDA approved for NASAL specimens only), is one component of a comprehensive MRSA colonization surveillance program. It is not intended to diagnose MRSA infection nor to guide or monitor treatment for MRSA infections.      Studies: Dg Chest Port 1 View  04/02/2014   CLINICAL DATA:  Cough and chest congestion and shortness of breath.  EXAM: PORTABLE CHEST - 1 VIEW  COMPARISON:  03/30/2014 and CT scan dated 03/31/2014  FINDINGS: Cardiomegaly with tortuosity of the thoracic aorta. Increasing moderate left pleural effusion. Tiny right effusion. Pulmonary vascularity is normal. Chronic accentuation of the interstitial markings.  IMPRESSION: Increasing moderate left pleural effusion.   Electronically Signed   By: Stephanie BoyersJames  Collier M.D.   On: 04/02/2014 07:10    Scheduled Meds: . antiseptic oral rinse  7 mL Mouth Rinse BID  . cefTRIAXone (ROCEPHIN)  IV  1 g Intravenous Q24H  . guaiFENesin  600 mg Oral BID  . lactose free nutrition  237 mL Oral TID WC  . metoprolol tartrate  50 mg Oral Q12H  . pantoprazole  40 mg Oral Daily   Continuous Infusions: . sodium chloride 10 mL/hr at 04/03/14 0043  . sodium chloride       Principal Problem:   Descending thoracic aortic dissection Active Problems:   UTI (lower urinary tract infection)   Acute bronchitis   HTN (hypertension)    Time spent: 25 min    [facility],Stephanie Collier S  [facility] Pager [phone]. If 7PM-7AM, please contact night-coverage at [email or web address], password [email or web address] 04/03/2014, 2:21 PM  LOS: 4 days

## 2014-04-03 NOTE — Progress Notes (Signed)
No SCDS per MD [place]. Pt educated. Pt also educated about the importance of calling for assistance when getting up to the bedside commode r/t medications, cords, and safety. Call bell within reach, yellow socks on, arm band on. Will continue to monitor. Cecille Rubinhompson,Macyn Shropshire V, RN

## 2014-04-03 NOTE — Progress Notes (Signed)
1 Day Post-Op Procedure(s) (LRB): INSERTION OF VENA CAVA FILTER (N/A) Subjective: Having some left back pain this afternoon. Had two loose stools today that she thinks are related to the Mucinex.  Objective: Vital signs in last 24 hours: Temp:  [98.2 F (36.8 C)-98.9 F (37.2 C)] 98.3 F (36.8 C) (03/23 1148) Pulse Rate:  [65-74] 71 (03/23 0549) Cardiac Rhythm:  [-] Normal sinus rhythm (03/23 0842) Resp:  [20-26] 26 (03/23 0552) BP: (107-148)/(56-80) 148/64 mmHg (03/23 1618) SpO2:  [92 %-95 %] 94 % (03/23 0745) Weight:  [57.879 kg (127 lb 9.6 oz)] 57.879 kg (127 lb 9.6 oz) (03/23 0658)  Hemodynamic parameters for last 24 hours:    Intake/Output from previous day: 03/22 0701 - 03/23 0700 In: 380 [P.O.:260; I.V.:120] Out: 250 [Urine:250] Intake/Output this shift: Total I/O In: 240 [P.O.:240] Out: -   General appearance: alert, cooperative and still has rattling cough Heart: regular rate and rhythm, S1, S2 normal, no murmur, click, rub or gallop Lungs: diminished breath sounds LLL, coarse elsewhere  Lab Results:  Recent Labs  04/02/14 0432 04/03/14 0324  WBC 6.7 7.0  HGB 9.7* 9.2*  HCT 29.9* 29.4*  PLT 252 269   BMET:  Recent Labs  04/02/14 0432 04/03/14 0324  NA 137 136  K 3.6 3.6  CL 100 102  CO2 28 29  GLUCOSE 85 110*  BUN 13 14  CREATININE 0.53 0.48*  CALCIUM 8.4 8.0*    PT/INR: No results for input(s): LABPROT, INR in the last 72 hours. ABG No results found for: PHART, HCO3, TCO2, ACIDBASEDEF, O2SAT CBG (last 3)  No results for input(s): GLUCAP in the last 72 hours.  Assessment/Plan:  1. Penetrating descending aortic ulcer with intramural hematoma or focal dissection. Still having some pain. Plan TEVAR  tomorrow. Discussed the procedure , alternatives, benefits and risks again with her including but not limited to bleeding, blood transfusion, infection, vascular injury, paresis or paralysis, death and she is in agreement. She also has a moderate  enlarging left pleural effusion so I will plan to insert a left chest tube at the same time to drain this. She is in agreement with that.  2. Venous duplex shows DVT in the right popliteal and peroneal veins with the thrombus in the popliteal vein unattached to the vessel walls. Occlusive thrombus i the peroneal vein. No DVT on the left. Dr. Myra GianottiBrabham inserted IVC filter yesterday.  3. URI/bronchitis: she remain on Ceftriaxone but still has the same cough that has not resolved.  4. UTI: on Ceftriaxone. Repeat UA today looks better. She has had no fever and her WBC ct is normal.  5. Loose stool today: etiology unclear. She thinks it is the Mucinex. She has not been drinking much today so will give her some IVF to prevent dehydration.  LOS: 4 days    Alleen BorneBryan K Jaia Alonge 04/03/2014

## 2014-04-03 NOTE — Progress Notes (Signed)
    Subjective  -   Patient reports no discomfort in her right groin from her IVC filter placement yesterday.  Continues to have cough.   Physical Exam:  Pulmonary: Respirations are nonlabored.  She does have a productive cough. CV: Palpable pedal pulses Abdomen: Soft       Assessment/Plan:    The patient continues to have a productive cough,.  Her white blood cell count is normal.  She is having low-grade fevers.  Blood cultures have been negative.  UA was positive on 3/19.  She is currently on antibiotics.  Assuming she is cleared to proceed with surgery from her pulmonary physicians, we will proceed with endovascular repair of her penetrating thoracic aortic ulcer tomorrow (Thursday).  I detailed the risks and benefits of the procedure with the patient and her family yesterday.  The patient does not want to hear them again today.  I will repeat her UA today.  She will be nothing by mouth after midnight.  Damere Brandenburg IV, V. WELLS 04/03/2014 9:10 AM --  Filed Vitals:   04/03/14 0845  BP: 125/58  Pulse:   Temp:   Resp:     Intake/Output Summary (Last 24 hours) at 04/03/14 0910 Last data filed at 04/03/14 0842  Gross per 24 hour  Intake    500 ml  Output    250 ml  Net    250 ml     Laboratory CBC    Component Value Date/Time   WBC 7.0 04/03/2014 0324   HGB 9.2* 04/03/2014 0324   HCT 29.4* 04/03/2014 0324   PLT 269 04/03/2014 0324    BMET    Component Value Date/Time   NA 136 04/03/2014 0324   K 3.6 04/03/2014 0324   CL 102 04/03/2014 0324   CO2 29 04/03/2014 0324   GLUCOSE 110* 04/03/2014 0324   BUN 14 04/03/2014 0324   CREATININE 0.48* 04/03/2014 0324   CALCIUM 8.0* 04/03/2014 0324   GFRNONAA >90 04/03/2014 0324   GFRAA >90 04/03/2014 0324    COAG Lab Results  Component Value Date   INR 1.02 08/10/2012   No results found for: PTT  Antibiotics Anti-infectives    Start     Dose/Rate Route Frequency Ordered Stop   04/01/14 1300   azithromycin (ZITHROMAX) 500 mg in dextrose 5 % 250 mL IVPB  Status:  Discontinued     500 mg 250 mL/hr over 60 Minutes Intravenous Every 24 hours 04/01/14 1149 04/01/14 1239   03/30/14 1400  cefTRIAXone (ROCEPHIN) 1 g in dextrose 5 % 50 mL IVPB     1 g 100 mL/hr over 30 Minutes Intravenous Every 24 hours 03/30/14 1316     03/30/14 0900  cefTRIAXone (ROCEPHIN) 1 g in dextrose 5 % 50 mL IVPB     1 g 100 mL/hr over 30 Minutes Intravenous  Once 03/30/14 0851 03/30/14 1119   03/30/14 0900  azithromycin (ZITHROMAX) 500 mg in dextrose 5 % 250 mL IVPB     500 mg 250 mL/hr over 60 Minutes Intravenous  Once 03/30/14 0851 03/30/14 1119       V. [place] Lindie Roberson IV, M.D. [facility] [place] Office: [phone] Pager:  [phone]  

## 2014-04-03 NOTE — Care Management Note (Signed)
    Page 1 of 2   04/13/2014     2:17:09 PM CARE MANAGEMENT NOTE 04/13/2014  Patient:  LONDON, NONAKA   Account Number:  [id number]  Date Initiated:  04/03/2014  Documentation initiated by:  GRAVES-BIGELOW,BRENDA  Subjective/Objective Assessment:   Pt admitted for severe back pain and fever. CT revealed Aortic dissection. Pt is from home with family support.     Action/Plan:   CM will continue to monitor for disposition needs.   Anticipated DC Date:  04/12/2014   Anticipated DC Plan:  [place]  CM consult      [facility] Choice  HOME HEALTH   Choice offered to / List presented to:  C-1 Patient   DME arranged  Vassie Moselle      DME agency  [place] arranged  [place]   Status of service:  Completed, signed off Medicare Important Message given?  YES (If response is "NO", the following Medicare IM given date fields will be blank) Date Medicare IM given:  04/05/2014 Medicare IM given by:  Elissa Hefty Date Additional Medicare IM given:  04/11/2014 Additional Medicare IM given by:  Marvetta Gibbons  Discharge Disposition:  [place]  Per UR Regulation:  Reviewed for med. necessity/level of care/duration of stay  If discussed at [place] of Stay Meetings, dates discussed:   04/04/2014  04/11/2014    Comments:  04/13/14 14:00 CM met with pt in room to follow up on choice of home health agency.  Pt chooses [facility] to render HHPT. CM called [facility] DME rep, Jeneen Rinks to please deliver the rolling walker to room.  CM notes pt does not meet criteria for home O2: verified with RN who states she sats in the 90s room air; RN made MD aware.  Pt will be staying with daughter, [name] [phone] [address] [place], [place] [address].  Referral called to [employer], [place]. No other Cm needs were communicated.  Tempie Hoist, BSN, [place].   04/11/14- 4- Marvetta Gibbons RN,  BSN [phone] Spoke with pt at bedside- plan is for pt to go home with daughter here in [place]- would like HH-PT based on PT recommendations and DME- RW and shower chair- will f/u with pt on agency of choice and make referral once orders placed  04/08/14 [place] S/P endovascular stent graft for ulcer descending aorta.

## 2014-04-03 NOTE — Progress Notes (Signed)
RN instructed patient to call for staff the next time patient needed to use the bedside commode so staff could obtain her weight on the standing scale after voiding.  Patient agreeable.

## 2014-04-04 ENCOUNTER — Encounter (HOSPITAL_COMMUNITY)
Admission: EM | Disposition: A | Discharge status: Home / Self Care | Payer: Self-pay | Source: Home / Self Care | Attending: Surgery

## 2014-04-04 ENCOUNTER — Inpatient Hospital Stay (HOSPITAL_COMMUNITY): Payer: Medicare Other

## 2014-04-04 ENCOUNTER — Inpatient Hospital Stay (HOSPITAL_COMMUNITY): Payer: Medicare Other | Admitting: Anesthesiology

## 2014-04-04 ENCOUNTER — Encounter (HOSPITAL_COMMUNITY): Payer: Self-pay | Admitting: Anesthesiology

## 2014-04-04 DIAGNOSIS — I712 Thoracic aortic aneurysm, without rupture, unspecified: Secondary | ICD-10-CM | POA: Diagnosis present

## 2014-04-04 HISTORY — PX: THORACIC AORTIC ENDOVASCULAR STENT GRAFT: SHX6112

## 2014-04-04 HISTORY — PX: CHEST TUBE INSERTION: SHX231

## 2014-04-04 LAB — PROTIME-INR
INR: 1.26 (ref 0.00–1.49)
Prothrombin Time: 15.9 seconds — ABNORMAL HIGH (ref 11.6–15.2)

## 2014-04-04 LAB — GLUCOSE, CAPILLARY
Glucose-Capillary: 117 mg/dL — ABNORMAL HIGH (ref 70–99)
Glucose-Capillary: 127 mg/dL — ABNORMAL HIGH (ref 70–99)
Glucose-Capillary: 136 mg/dL — ABNORMAL HIGH (ref 70–99)

## 2014-04-04 LAB — CBC
HCT: 28.1 % — ABNORMAL LOW (ref 36.0–46.0)
HEMATOCRIT: 31.4 % — AB (ref 36.0–46.0)
HEMOGLOBIN: 8.9 g/dL — AB (ref 12.0–15.0)
Hemoglobin: 9.8 g/dL — ABNORMAL LOW (ref 12.0–15.0)
MCH: 29.8 pg (ref 26.0–34.0)
MCH: 30.1 pg (ref 26.0–34.0)
MCHC: 31.2 g/dL (ref 30.0–36.0)
MCHC: 31.7 g/dL (ref 30.0–36.0)
MCV: 95.4 fL (ref 78.0–100.0)
MCV: 94.9 fL (ref 78.0–100.0)
Platelets: 280 10*3/uL (ref 150–400)
Platelets: 263 10*3/uL (ref 150–400)
RBC: 3.29 MIL/uL — ABNORMAL LOW (ref 3.87–5.11)
RBC: 2.96 MIL/uL — AB (ref 3.87–5.11)
RDW: 14 % (ref 11.5–15.5)
RDW: 13.9 % (ref 11.5–15.5)
WBC: 6.2 10*3/uL (ref 4.0–10.5)
WBC: 7.8 10*3/uL (ref 4.0–10.5)

## 2014-04-04 LAB — ABO/RH: ABO/RH(D): O POS

## 2014-04-04 LAB — BASIC METABOLIC PANEL
ANION GAP: 7 (ref 5–15)
BUN: 11 mg/dL (ref 6–23)
CALCIUM: 8.2 mg/dL — AB (ref 8.4–10.5)
CHLORIDE: 101 mmol/L (ref 96–112)
CO2: 30 mmol/L (ref 19–32)
Creatinine, Ser: 0.42 mg/dL — ABNORMAL LOW (ref 0.50–1.10)
GFR calc non Af Amer: >90 mL/min (ref >90)
Glucose, Bld: 106 mg/dL — ABNORMAL HIGH (ref 70–99)
Potassium: 3.6 mmol/L (ref 3.5–5.1)
SODIUM: 138 mmol/L (ref 135–145)

## 2014-04-04 SURGERY — INSERTION, ENDOVASCULAR STENT GRAFT, AORTA, THORACIC
Anesthesia: General | Site: Chest

## 2014-04-04 MED ORDER — GLYCOPYRROLATE 0.2 MG/ML IJ SOLN
INTRAMUSCULAR | Status: AC
  Filled 2014-04-04: qty 2

## 2014-04-04 MED ORDER — GLYCOPYRROLATE 0.2 MG/ML IJ SOLN
INTRAMUSCULAR | Status: DC | PRN
  Administered 2014-04-04: .7 mg via INTRAVENOUS

## 2014-04-04 MED ORDER — PHENYLEPHRINE 40 MCG/ML (10ML) SYRINGE FOR IV PUSH (FOR BLOOD PRESSURE SUPPORT)
PREFILLED_SYRINGE | INTRAVENOUS | Status: AC
Start: 2014-04-04 — End: 2014-04-04
  Filled 2014-04-04: qty 10

## 2014-04-04 MED ORDER — ALBUMIN HUMAN 5 % IV SOLN
INTRAVENOUS | Status: DC | PRN
  Administered 2014-04-04: 12:00:00 via INTRAVENOUS

## 2014-04-04 MED ORDER — MAGNESIUM SULFATE 2 GM/50ML IV SOLN
2.0000 g | Freq: Every day | INTRAVENOUS | Status: DC | PRN
  Filled 2014-04-04: qty 50

## 2014-04-04 MED ORDER — SODIUM CHLORIDE 0.9 % IV SOLN: 500.0000 mL | Freq: Once | INTRAVENOUS | Status: AC | PRN

## 2014-04-04 MED ORDER — ALUM & MAG HYDROXIDE-SIMETH 200-200-20 MG/5ML PO SUSP: 15.0000 mL | ORAL | Status: DC | PRN

## 2014-04-04 MED ORDER — ROCURONIUM BROMIDE 100 MG/10ML IV SOLN
INTRAVENOUS | Status: DC | PRN
  Administered 2014-04-04: 10 mg via INTRAVENOUS
  Administered 2014-04-04: 30 mg via INTRAVENOUS

## 2014-04-04 MED ORDER — ROCURONIUM BROMIDE 50 MG/5ML IV SOLN
INTRAVENOUS | Status: AC
  Filled 2014-04-04: qty 1

## 2014-04-04 MED ORDER — SODIUM CHLORIDE 0.9 % IR SOLN
Status: DC | PRN
  Administered 2014-04-04: 500 mL

## 2014-04-04 MED ORDER — LACTATED RINGERS IV SOLN
INTRAVENOUS | Status: DC | PRN
  Administered 2014-04-04: 11:00:00 via INTRAVENOUS

## 2014-04-04 MED ORDER — NEOSTIGMINE METHYLSULFATE 10 MG/10ML IV SOLN
INTRAVENOUS | Status: AC
  Filled 2014-04-04: qty 1

## 2014-04-04 MED ORDER — ACETAMINOPHEN 325 MG PO TABS
325.0000 mg | ORAL_TABLET | ORAL | Status: DC | PRN
  Administered 2014-04-12: 650 mg via ORAL
  Filled 2014-04-04: qty 2

## 2014-04-04 MED ORDER — HYDROMORPHONE HCL 1 MG/ML IJ SOLN
INTRAMUSCULAR | Status: AC
  Filled 2014-04-04: qty 1

## 2014-04-04 MED ORDER — ARTIFICIAL TEARS OP OINT
TOPICAL_OINTMENT | OPHTHALMIC | Status: AC
  Filled 2014-04-04: qty 3.5

## 2014-04-04 MED ORDER — PROPOFOL 10 MG/ML IV BOLUS
INTRAVENOUS | Status: DC | PRN
  Administered 2014-04-04: 150 mg via INTRAVENOUS

## 2014-04-04 MED ORDER — ONDANSETRON HCL 4 MG/2ML IJ SOLN
4.0000 mg | Freq: Four times a day (QID) | INTRAMUSCULAR | Status: DC | PRN
  Administered 2014-04-04 – 2014-04-07 (×5): 4 mg via INTRAVENOUS
  Filled 2014-04-04 (×8): qty 2

## 2014-04-04 MED ORDER — METOPROLOL TARTRATE 1 MG/ML IV SOLN: 2.0000 mg | INTRAVENOUS | Status: DC | PRN

## 2014-04-04 MED ORDER — PROTAMINE SULFATE 10 MG/ML IV SOLN
INTRAVENOUS | Status: DC | PRN
  Administered 2014-04-04: 50 mg via INTRAVENOUS

## 2014-04-04 MED ORDER — ROCURONIUM BROMIDE 50 MG/5ML IV SOLN
INTRAVENOUS | Status: AC
  Filled 2014-04-04: qty 2

## 2014-04-04 MED ORDER — HYDROMORPHONE HCL 1 MG/ML IJ SOLN
0.2500 mg | INTRAMUSCULAR | Status: DC | PRN
  Administered 2014-04-04: 0.5 mg via INTRAVENOUS

## 2014-04-04 MED ORDER — DEXTROSE-NACL 5-0.9 % IV SOLN
INTRAVENOUS | Status: DC
  Administered 2014-04-04: 15:00:00 via INTRAVENOUS
  Administered 2014-04-05: 75 mL/h via INTRAVENOUS
  Administered 2014-04-06: 13:00:00 via INTRAVENOUS
  Administered 2014-04-07: 75 mL/h via INTRAVENOUS
  Administered 2014-04-08 – 2014-04-09 (×3): via INTRAVENOUS

## 2014-04-04 MED ORDER — TRAMADOL HCL 50 MG PO TABS
50.0000 mg | ORAL_TABLET | Freq: Four times a day (QID) | ORAL | Status: DC | PRN
  Filled 2014-04-04: qty 1

## 2014-04-04 MED ORDER — LIDOCAINE HCL (CARDIAC) 20 MG/ML IV SOLN
INTRAVENOUS | Status: AC
  Filled 2014-04-04: qty 10

## 2014-04-04 MED ORDER — LABETALOL HCL 5 MG/ML IV SOLN
10.0000 mg | INTRAVENOUS | Status: DC | PRN
  Administered 2014-04-04 (×2): 10 mg via INTRAVENOUS
  Filled 2014-04-04: qty 4

## 2014-04-04 MED ORDER — EPHEDRINE SULFATE 50 MG/ML IJ SOLN
INTRAMUSCULAR | Status: AC
  Filled 2014-04-04: qty 1

## 2014-04-04 MED ORDER — MIDAZOLAM HCL 2 MG/2ML IJ SOLN
INTRAMUSCULAR | Status: AC
  Filled 2014-04-04: qty 2

## 2014-04-04 MED ORDER — SENNOSIDES-DOCUSATE SODIUM 8.6-50 MG PO TABS
1.0000 | ORAL_TABLET | Freq: Every evening | ORAL | Status: DC | PRN
  Filled 2014-04-04: qty 1

## 2014-04-04 MED ORDER — NITROGLYCERIN IN D5W 200-5 MCG/ML-% IV SOLN: 2.0000 ug/min | INTRAVENOUS | Status: DC

## 2014-04-04 MED ORDER — GLYCOPYRROLATE 0.2 MG/ML IJ SOLN
INTRAMUSCULAR | Status: AC
  Filled 2014-04-04: qty 3

## 2014-04-04 MED ORDER — NEOSTIGMINE METHYLSULFATE 10 MG/10ML IV SOLN
INTRAVENOUS | Status: DC | PRN
  Administered 2014-04-04: 4 mg via INTRAVENOUS

## 2014-04-04 MED ORDER — LIDOCAINE HCL (CARDIAC) 20 MG/ML IV SOLN
INTRAVENOUS | Status: AC
  Filled 2014-04-04: qty 5

## 2014-04-04 MED ORDER — FENTANYL CITRATE 0.05 MG/ML IJ SOLN
INTRAMUSCULAR | Status: AC
  Filled 2014-04-04: qty 5

## 2014-04-04 MED ORDER — BISACODYL 10 MG RE SUPP: 10.0000 mg | Freq: Every day | RECTAL | Status: DC | PRN

## 2014-04-04 MED ORDER — PHENOL 1.4 % MT LIQD: 1.0000 | OROMUCOSAL | Status: DC | PRN

## 2014-04-04 MED ORDER — SODIUM CHLORIDE 0.9 % IJ SOLN
INTRAMUSCULAR | Status: AC
Start: 2014-04-04 — End: 2014-04-04
  Filled 2014-04-04: qty 10

## 2014-04-04 MED ORDER — ONDANSETRON HCL 4 MG/2ML IJ SOLN
INTRAMUSCULAR | Status: DC | PRN
  Administered 2014-04-04: 4 mg via INTRAVENOUS

## 2014-04-04 MED ORDER — FENTANYL CITRATE 0.05 MG/ML IJ SOLN
INTRAMUSCULAR | Status: DC | PRN
  Administered 2014-04-04: 50 ug via INTRAVENOUS
  Administered 2014-04-04 (×2): 100 ug via INTRAVENOUS

## 2014-04-04 MED ORDER — LABETALOL HCL 5 MG/ML IV SOLN
INTRAVENOUS | Status: AC
  Filled 2014-04-04: qty 4

## 2014-04-04 MED ORDER — ONDANSETRON HCL 4 MG/2ML IJ SOLN
INTRAMUSCULAR | Status: AC
Start: 2014-04-04 — End: 2014-04-04
  Filled 2014-04-04: qty 2

## 2014-04-04 MED ORDER — SUCCINYLCHOLINE CHLORIDE 20 MG/ML IJ SOLN
INTRAMUSCULAR | Status: AC
  Filled 2014-04-04: qty 1

## 2014-04-04 MED ORDER — GUAIFENESIN-DM 100-10 MG/5ML PO SYRP: 15.0000 mL | ORAL_SOLUTION | ORAL | Status: DC | PRN

## 2014-04-04 MED ORDER — HEPARIN SODIUM (PORCINE) 1000 UNIT/ML IJ SOLN
INTRAMUSCULAR | Status: DC | PRN
  Administered 2014-04-04: 5500 [IU] via INTRAVENOUS

## 2014-04-04 MED ORDER — ONDANSETRON HCL 4 MG/2ML IJ SOLN
INTRAMUSCULAR | Status: AC
  Filled 2014-04-04: qty 2

## 2014-04-04 MED ORDER — MIDAZOLAM HCL 10 MG/2ML IJ SOLN
INTRAMUSCULAR | Status: AC
  Filled 2014-04-04: qty 2

## 2014-04-04 MED ORDER — MORPHINE SULFATE 2 MG/ML IJ SOLN
2.0000 mg | INTRAMUSCULAR | Status: DC | PRN
Start: 2014-04-04 — End: 2014-04-13
  Administered 2014-04-04 (×3): 2 mg via INTRAVENOUS
  Administered 2014-04-04: 4 mg via INTRAVENOUS
  Administered 2014-04-04: 2 mg via INTRAVENOUS
  Administered 2014-04-05 (×2): 4 mg via INTRAVENOUS
  Administered 2014-04-05 (×2): 2 mg via INTRAVENOUS
  Administered 2014-04-05 – 2014-04-06 (×5): 4 mg via INTRAVENOUS
  Administered 2014-04-06 – 2014-04-07 (×5): 2 mg via INTRAVENOUS
  Filled 2014-04-04: qty 1
  Filled 2014-04-04: qty 2
  Filled 2014-04-04: qty 1
  Filled 2014-04-04 (×2): qty 2
  Filled 2014-04-04 (×3): qty 1
  Filled 2014-04-04 (×3): qty 2
  Filled 2014-04-04: qty 1
  Filled 2014-04-04: qty 2
  Filled 2014-04-04: qty 1
  Filled 2014-04-04: qty 2
  Filled 2014-04-04: qty 1
  Filled 2014-04-04 (×2): qty 2

## 2014-04-04 MED ORDER — ONDANSETRON HCL 4 MG/2ML IJ SOLN: 4.0000 mg | Freq: Once | INTRAMUSCULAR | Status: DC | PRN

## 2014-04-04 MED ORDER — AMLODIPINE BESYLATE 5 MG PO TABS
5.0000 mg | ORAL_TABLET | Freq: Every day | ORAL | Status: DC
  Administered 2014-04-04 – 2014-04-13 (×10): 5 mg via ORAL
  Filled 2014-04-04 (×10): qty 1

## 2014-04-04 MED ORDER — METOCLOPRAMIDE HCL 5 MG/ML IJ SOLN
10.0000 mg | Freq: Four times a day (QID) | INTRAMUSCULAR | Status: DC
  Administered 2014-04-04 – 2014-04-08 (×17): 10 mg via INTRAVENOUS
  Filled 2014-04-04 (×40): qty 2

## 2014-04-04 MED ORDER — IODIXANOL 320 MG/ML IV SOLN
INTRAVENOUS | Status: DC | PRN
  Administered 2014-04-04: 76.9 mL via INTRAVENOUS

## 2014-04-04 MED ORDER — MIDAZOLAM HCL 5 MG/5ML IJ SOLN
INTRAMUSCULAR | Status: DC | PRN
  Administered 2014-04-04 (×2): 1 mg via INTRAVENOUS

## 2014-04-04 MED ORDER — ACETAMINOPHEN 650 MG RE SUPP: 325.0000 mg | RECTAL | Status: DC | PRN

## 2014-04-04 MED ORDER — INSULIN ASPART 100 UNIT/ML ~~LOC~~ SOLN
0.0000 [IU] | SUBCUTANEOUS | Status: DC
  Administered 2014-04-04 – 2014-04-06 (×5): 2 [IU] via SUBCUTANEOUS

## 2014-04-04 MED ORDER — DEXTROSE 5 % IV SOLN
1.5000 g | INTRAVENOUS | Status: DC
  Filled 2014-04-04 (×2): qty 1.5

## 2014-04-04 MED ORDER — HYDRALAZINE HCL 20 MG/ML IJ SOLN
5.0000 mg | INTRAMUSCULAR | Status: DC | PRN
Start: 2014-04-04 — End: 2014-04-09
  Administered 2014-04-04: 5 mg via INTRAVENOUS
  Filled 2014-04-04: qty 1

## 2014-04-04 MED ORDER — DEXTROSE 5 % IV SOLN
1.5000 g | INTRAVENOUS | Status: DC | PRN
  Administered 2014-04-04: 1.5 g via INTRAVENOUS

## 2014-04-04 MED ORDER — MORPHINE SULFATE 2 MG/ML IJ SOLN
INTRAMUSCULAR | Status: AC
  Filled 2014-04-04: qty 1

## 2014-04-04 MED ORDER — DOCUSATE SODIUM 100 MG PO CAPS
100.0000 mg | ORAL_CAPSULE | Freq: Every day | ORAL | Status: DC
  Administered 2014-04-07 – 2014-04-10 (×3): 100 mg via ORAL
  Filled 2014-04-04 (×5): qty 1

## 2014-04-04 MED ORDER — POTASSIUM CHLORIDE CRYS ER 20 MEQ PO TBCR
20.0000 meq | EXTENDED_RELEASE_TABLET | Freq: Every day | ORAL | Status: AC | PRN
Start: 2014-04-04 — End: 2014-04-07
  Administered 2014-04-07: 40 meq via ORAL
  Filled 2014-04-04: qty 2

## 2014-04-04 MED ORDER — PROPOFOL 10 MG/ML IV BOLUS
INTRAVENOUS | Status: AC
  Filled 2014-04-04: qty 20

## 2014-04-04 MED ORDER — PANTOPRAZOLE SODIUM 40 MG PO TBEC
40.0000 mg | DELAYED_RELEASE_TABLET | Freq: Every day | ORAL | Status: DC
  Administered 2014-04-04 – 2014-04-13 (×10): 40 mg via ORAL
  Filled 2014-04-04 (×8): qty 1

## 2014-04-04 MED ORDER — LIDOCAINE HCL (CARDIAC) 20 MG/ML IV SOLN
INTRAVENOUS | Status: DC | PRN
  Administered 2014-04-04: 50 mg via INTRAVENOUS

## 2014-04-04 MED ORDER — ARTIFICIAL TEARS OP OINT
TOPICAL_OINTMENT | OPHTHALMIC | Status: AC
Start: 2014-04-04 — End: 2014-04-04
  Filled 2014-04-04: qty 3.5

## 2014-04-04 SURGICAL SUPPLY — 52 items
BAG SNAP BAND KOVER 36X36 (MISCELLANEOUS) ×3 IMPLANT
CANISTER SUCTION 2500CC (MISCELLANEOUS) ×3 IMPLANT
CATH ACCU-VU SIZ PIG 5F 100CM (CATHETERS) ×3 IMPLANT
CATH TROCAR 28FR (CATHETERS) ×3 IMPLANT
COVER PROBE W GEL 5X96 (DRAPES) IMPLANT
DEVICE CLOSURE PERCLS PRGLD 6F (VASCULAR PRODUCTS) ×4 IMPLANT
DRSG TEGADERM 2-3/8X2-3/4 SM (GAUZE/BANDAGES/DRESSINGS) ×3 IMPLANT
DRYSEAL FLEXSHEATH 22FR 33CM (SHEATH) ×1
ELECT REM PT RETURN 9FT ADLT (ELECTROSURGICAL) ×6
ELECTRODE REM PT RTRN 9FT ADLT (ELECTROSURGICAL) ×4 IMPLANT
ENDOPROSTHESIS THORAC 31X31X15 (Endovascular Graft) ×4 IMPLANT
ENDOPROTH THORACIC 31X31X15 (Endovascular Graft) ×6 IMPLANT
GLOVE BIOGEL PI IND STRL 7.5 (GLOVE) ×2 IMPLANT
GLOVE BIOGEL PI INDICATOR 7.5 (GLOVE) ×1
GLOVE SURG SS PI 7.5 STRL IVOR (GLOVE) ×3 IMPLANT
GOWN STRL REUS W/ TWL LRG LVL3 (GOWN DISPOSABLE) IMPLANT
GOWN STRL REUS W/ TWL XL LVL3 (GOWN DISPOSABLE) ×6 IMPLANT
GOWN STRL REUS W/TWL LRG LVL3 (GOWN DISPOSABLE)
GOWN STRL REUS W/TWL XL LVL3 (GOWN DISPOSABLE) ×3
GRAFT BALLN CATH 65CM (STENTS) ×2 IMPLANT
HEMOSTAT SNOW SURGICEL 2X4 (HEMOSTASIS) IMPLANT
KIT BASIN OR (CUSTOM PROCEDURE TRAY) ×3 IMPLANT
KIT ROOM TURNOVER OR (KITS) ×3 IMPLANT
LIQUID BAND (GAUZE/BANDAGES/DRESSINGS) ×3 IMPLANT
NEEDLE PERC 18GX7CM (NEEDLE) ×3 IMPLANT
NS IRRIG 1000ML POUR BTL (IV SOLUTION) ×3 IMPLANT
PACK ENDOVASCULAR (PACKS) ×3 IMPLANT
PAD ARMBOARD 7.5X6 YLW CONV (MISCELLANEOUS) ×6 IMPLANT
PAD ELECT DEFIB RADIOL ZOLL (MISCELLANEOUS) ×6 IMPLANT
PERCLOSE PROGLIDE 6F (VASCULAR PRODUCTS) ×6
SHEATH AVANTI 11CM 5FR (MISCELLANEOUS) ×3 IMPLANT
SHEATH AVANTI 11CM 8FR (MISCELLANEOUS) ×3 IMPLANT
SHEATH DRYSEAL FLEX 22FR 33CM (SHEATH) ×2 IMPLANT
SPONGE GAUZE 4X4 12PLY STER LF (GAUZE/BANDAGES/DRESSINGS) ×6 IMPLANT
STENT GRAFT BALLN CATH 65CM (STENTS) ×1
STOPCOCK MORSE 400PSI 3WAY (MISCELLANEOUS) ×3 IMPLANT
SUT ETHILON 3 0 PS 1 (SUTURE) IMPLANT
SUT PROLENE 5 0 C 1 24 (SUTURE) IMPLANT
SUT SILK  1 MH (SUTURE) ×1
SUT SILK 1 MH (SUTURE) ×2 IMPLANT
SUT VIC AB 2-0 CT1 27 (SUTURE)
SUT VIC AB 2-0 CT1 TAPERPNT 27 (SUTURE) IMPLANT
SUT VIC AB 3-0 SH 27 (SUTURE)
SUT VIC AB 3-0 SH 27X BRD (SUTURE) IMPLANT
SUT VICRYL 4-0 PS2 18IN ABS (SUTURE) ×6 IMPLANT
SYR 30ML LL (SYRINGE) IMPLANT
TAPE CLOTH SURG 4X10 WHT LF (GAUZE/BANDAGES/DRESSINGS) ×3 IMPLANT
TAPE STRIPS DRAPE STRL (GAUZE/BANDAGES/DRESSINGS) ×3 IMPLANT
TRAY FOLEY CATH 16FRSI W/METER (SET/KITS/TRAYS/PACK) ×3 IMPLANT
TUBING HIGH PRESSURE 120CM (CONNECTOR) ×3 IMPLANT
WIRE BENTSON .035X145CM (WIRE) ×3 IMPLANT
WIRE STIFF LUNDERQUIST 260CM (WIRE) ×3 IMPLANT

## 2014-04-04 NOTE — Interval H&P Note (Signed)
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 After consideration of risks, benefits and other options for treatment, the patient has consented to  Procedure(s): THORACIC AORTIC ENDOVASCULAR STENT GRAFT (N/A) as a surgical intervention .  The patient's history has been reviewed, patient examined, no change in status, stable for surgery.  I have reviewed the patient's chart and labs.  Questions were answered to the patient's satisfaction.     Delia Sitar IV, V. WELLS

## 2014-04-04 NOTE — Progress Notes (Signed)
[facility] PROGRESS NOTE  Stephanie CarboJanice Collier ZOX:[id number] DOB: Sep 02, 1935 DOA: 03/30/2014 PCP: Pcp Not In System  Assessment/Plan: 1. Penetrating descending thoracic aorta also with intramural thrombus- patient has been seen by CT surgery as well as vascular surgery and plan is for endovascular repair on Thursday. Heparin is contraindicated at this time. Patient to go for surgery today. 2. PE/DVT- patient CT chest showed small to moderate burden PE in the artery the right lower lobe. Doppler ultrasound showed right lower extremity DVT. Patient underwent IVC filter placement on 04/02/2014. Anticoagulation is contraindicated as per vascular surgery. 3. Acute bronchitis- resolved. Can d/c the antibiotics. 4. Diarrhea- stool for C diff is negative. 5. UTI- patient started on Rocephin. Unfortunately urine culture not obtained. Can discontinue rocephin after 04/05/14 to complete 7 days of treatment. 6. Hypertension-  Stable, continue metoprolol 50 mg twice a day  Code Status: Full code Family Communication: Discussed with patient's daughter at bedside Disposition Plan: To be decided   Consultants:  Under PC CM care dated 04/01/2014  [facility] resumed care on 04/02/2014  Cardiothoracic surgery  Vascular surgery  Procedures:  IVC filter placement on 04/02/2014  Antibiotics:  *Rocephin and Zithromax  HPI/Subjective: 79 year old female came to the ER with severe low back pain and three-week viral illness including diarrhea fever cough. CT chest revealed thoracic aortic dissection. Patient was admitted the ICU, under CCM care. Patient on transfer to the floor under [facility] service This morning she voices no complaints. Patient to go for surgery today.  Objective: Filed Vitals:   04/04/14 0700  BP: 132/93  Pulse: 63  Temp: 98.1 F (36.7 C)  Resp: 20    Intake/Output Summary (Last 24 hours) at 04/04/14 1030 Last data filed at 04/04/14 0700  Gross per 24 hour   Intake    820 ml  Output      0 ml  Net    820 ml   Filed Weights   04/02/14 0420 04/03/14 0658 04/04/14 0500  Weight: 60.646 kg (133 lb 11.2 oz) 57.879 kg (127 lb 9.6 oz) 57.788 kg (127 lb 6.4 oz)    Exam:   General:  Appears in no acute distress  Cardiovascular: S1-S2 is regular  Respiratory: Clear to auscultation bilaterally  Abdomen: Soft, nontender no organomegaly  Musculoskeletal: No edema of the lower extremities noted   Data Reviewed: Basic Metabolic Panel:  Recent Labs Lab 03/31/14 0425 04/01/14 0430 04/02/14 0432 04/03/14 0324 04/04/14 0625  NA 136 137 137 136 138  K 3.7 3.6 3.6 3.6 3.6  CL 105 103 100 102 101  CO2 25 27 28 29 30   GLUCOSE 126* 98 85 110* 106*  BUN 12 7 13 14 11   CREATININE 0.49* 0.53 0.53 0.48* 0.42*  CALCIUM 8.1* 8.6 8.4 8.0* 8.2*   Liver Function Tests:  Recent Labs Lab 03/30/14 0718  AST 38*  ALT 27  ALKPHOS 64  BILITOT 0.8  PROT 7.2  ALBUMIN 2.9*    Recent Labs Lab 03/30/14 0718  LIPASE 26   No results for input(s): AMMONIA in the last 168 hours. CBC:  Recent Labs Lab 03/31/14 0425 04/01/14 0430 04/02/14 0432 04/03/14 0324 04/04/14 0625  WBC 8.4 8.8 6.7 7.0 6.2  NEUTROABS  --  6.4  --   --   --   HGB 9.1* 9.7* 9.7* 9.2* 9.8*  HCT 28.9* 30.5* 29.9* 29.4* 31.4*  MCV 96.3 94.7 94.3 93.9 95.4  PLT 204 246 252 269 280   Cardiac Enzymes: No results  for input(s): CKTOTAL, CKMB, CKMBINDEX, TROPONINI in the last 168 hours. BNP (last 3 results)  Recent Labs  03/30/14 0718  BNP 68.2    ProBNP (last 3 results) No results for input(s): PROBNP in the last 8760 hours.  CBG: No results for input(s): GLUCAP in the last 168 hours.  Recent Results (from the past 240 hour(s))  Culture, blood (routine x 2)     Status: None (Preliminary result)   Collection Time: 03/30/14  8:28 AM  Result Value Ref Range Status   Specimen Description BLOOD LEFT ARM  Final   Special Requests BOTTLES DRAWN AEROBIC AND ANAEROBIC  5CC  Final   Culture   Final           BLOOD CULTURE RECEIVED NO GROWTH TO DATE CULTURE WILL BE HELD FOR 5 DAYS BEFORE ISSUING A FINAL NEGATIVE REPORT Performed at [facility]    Report Status PENDING  Incomplete  Culture, blood (routine x 2)     Status: None (Preliminary result)   Collection Time: 03/30/14  8:49 AM  Result Value Ref Range Status   Specimen Description BLOOD RIGHT ARM  Final   Special Requests BOTTLES DRAWN AEROBIC AND ANAEROBIC 5CC  Final   Culture   Final           BLOOD CULTURE RECEIVED NO GROWTH TO DATE CULTURE WILL BE HELD FOR 5 DAYS BEFORE ISSUING A FINAL NEGATIVE REPORT Performed at [facility]    Report Status PENDING  Incomplete  MRSA PCR Screening     Status: None   Collection Time: 03/30/14  4:03 PM  Result Value Ref Range Status   MRSA by PCR NEGATIVE NEGATIVE Final    Comment:        The GeneXpert MRSA Assay (FDA approved for NASAL specimens only), is one component of a comprehensive MRSA colonization surveillance program. It is not intended to diagnose MRSA infection nor to guide or monitor treatment for MRSA infections.   Clostridium Difficile by PCR     Status: None   Collection Time: 04/03/14  4:25 PM  Result Value Ref Range Status   C difficile by pcr NEGATIVE NEGATIVE Final     Studies: No results found.  Scheduled Meds: . [MAR Hold] antiseptic oral rinse  7 mL Mouth Rinse BID  . [MAR Hold] cefTRIAXone (ROCEPHIN)  IV  1 g Intravenous Q24H  . [MAR Hold] guaiFENesin  600 mg Oral BID  . [MAR Hold] lactose free nutrition  237 mL Oral TID WC  . [MAR Hold] metoprolol tartrate  50 mg Oral Q12H  . [MAR Hold] pantoprazole  40 mg Oral Daily   Continuous Infusions: . sodium chloride 50 mL/hr at 04/03/14 1747  . sodium chloride      Principal Problem:   Descending thoracic aortic dissection Active Problems:   UTI (lower urinary tract infection)   Acute bronchitis   HTN (hypertension)    Time spent: 25  min    [facility]  [facility] Pager [phone]. If 7PM-7AM, please contact night-coverage at [email or web address], password [facility] 04/04/2014, 10:30 AM  LOS: 5 days

## 2014-04-04 NOTE — Op Note (Signed)
Stephanie Collier  [id number]  CARDIOVASCULAR SURGERY OPERATIVE NOTE   04/04/2014  Surgeon: Alleen Borne, MD   Co-Surgeon: Juleen China IV   Preoperative Diagnosis: Descending aortic penetrating ulcer with focal dissection or intramural hematoma  Postoperative Diagnosis: Same   Procedure:  1: Endovascular repair of descending aortic penetrating ulcer with focal dissection or intramural hematoma 2: Distal extension x1  3: Catheter in the thoracic aorta x2  4: Aortic arch angiogram  5: Thoracic aortogram   Anesthesia: General Endotracheal   Clinical History/Surgical Indication:    The patient is a 79 year old woman who presented with a several week history of left back pain. She had a CT scan of the chest without contrast since she refused it and this showed what looked like a type B aortic dissection extending from the left subclavian artery to the distal descending thoracic aorta. I reviewed her scan while I was in the OR and agreed that it was most likely a type B dissection and non-operative treatment was indicated. She was not hypertensive. She was admitted by CCM. She had a UTI by urinalysis and culture is pending. She has been treated with antibiotics for URI/bronchitis and UTI. She has continued to have some back pain as well as pleuritic chest pain with deep inspiration, poor appetite. No diarrhea. 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 There is no hemorrhage into the mediastinum or pleural space. There was also a nonocclusive PE seen in the superior segment of the RLL and within the RLL segmental and subsegmental branches. She had an IVC filter placed by Dr. Myra Gianotti and is now scheduled for endovascular stent graft repair of her aortic penetrating ulcer.   I discussed the procedure with her and her two  daughters including alternatives, benefits and risks including but not limited to bleeding, blood transfusion, infection, vascular complications, need for open repair, paresis or paralysis, need for further surgery due to endoleak or progression or aortic disease, organ dysfunction, and death. She understands and agrees to proceed.    Preparation:   The patient was seen in the preoperative holding area and the correct patient, correct operation were confirmed with the patient after reviewing the medical record and catheterization. The consent was signed by me. Preoperative antibiotics were given. A right internal jugular central line and radial arterial line were placed by the anesthesia team. The patient was taken back to the operating room and positioned supine on the operating room table. After being placed under general endotracheal anesthesia by the anesthesia team a foley catheter was placed. She had a spinal drain placed by Dr. Diamantina Monks. The neck, chest, abdomen, and both legs were prepped with betadine soap and solution and draped in the usual sterile manner. A surgical time-out was taken and the correct patient and operative procedure were confirmed with the nursing and anesthesia staff.    Insertion of endovascular graft:    The right common femoral artery was visualized with ultrasound and cannulated with an 18-gauge needle followed by a Teena Dunk wire. Two ProGlide closure devices were placed at 10:00 and 2:00. An 8-F sheath was placed and a pigtail catheter was advanced with the wire into the ascending aorta. The [facility] wire was exchanged for a double curved Lunderquist wire. A 22 F dry seal sheath was inserted over the wire into the aorta. The  proximal graft was a Gore TAG 31 x 31 x 15 cm. This was advanced into the descending thoracic aorta. Then a Teena DunkBenson wire was inserted through the dry seal sheath beside the device and a pigtail catheter was inserted over the wire and advanced into the  aorta. An aortic arch arteriogram was performed to localize the innominate, left common carotid, and left subclavian arteries. The proximal graft was positioned and deployed in excellent position just distal to the left subclavian artery. The pigtail was pulled back and advanced through the graft and another aortogram confirmed excellent position. Then the distal extension graft was prepared. This was a 31 x 31 x15 cm Gore TAG. This was advanced inside the proximal graft with a 5 cm overlap. The pigtail was pulled back into the distal thoracic aorta and a thoracic aortogram performed to confirm that the graft would cover the penetrating ulcer or pseudoaneurysm. The extension graft was deployed. The pigtail was then advanced into the ascending aorta and an aortogram showed no endoleak. The catheters were removed followed by the sheath, leaving the wire. The ProGlide closure devices were tied and there was good hemostasis. Protamine was given. The skin was closed with Vicryl suture. Dermabond was applied. All sponge, needle, and instrument counts were reported correct at the end of the case. The patient was awakened and transported to the PACU in hemodynamically stable and satisfactory condition.    Alleen BorneBryan K. Oluwatimilehin Balfour, MD

## 2014-04-04 NOTE — Anesthesia Postprocedure Evaluation (Signed)
  Anesthesia Post-op Note  Patient: Stephanie Collier  Procedure(s) Performed: Procedure(s) with comments: THORACIC AORTIC ENDOVASCULAR STENT GRAFT (N/A) CHEST TUBE INSERTION (Left) - by Dr. Collier BullockBarttle  Patient Location: PACU  Anesthesia Type:General  Level of Consciousness: awake, oriented, sedated and patient cooperative  Airway and Oxygen Therapy: Patient Spontanous Breathing  Post-op Pain: mild, moderate  Post-op Assessment: Post-op Vital signs reviewed, Patient's Cardiovascular Status Stable, Respiratory Function Stable, RESPIRATORY FUNCTION UNSTABLE, Patent Airway, No signs of Nausea or vomiting and Pain level controlled  Post-op Vital Signs: stable  Last Vitals:  Filed Vitals:   04/04/14 0700  BP: 132/93  Pulse: 63  Temp: 36.7 C  Resp: 20    Complications: No apparent anesthesia complications

## 2014-04-04 NOTE — Anesthesia Preprocedure Evaluation (Signed)
Anesthesia Evaluation  Patient identified by MRN, date of birth, ID band Patient awake    Reviewed: Allergy & Precautions, NPO status , Patient's Chart, lab work & pertinent test results  Airway        Dental   Pulmonary          Cardiovascular hypertension, + Peripheral Vascular Disease and DVT     Neuro/Psych    GI/Hepatic   Endo/Other    Renal/GU      Musculoskeletal   Abdominal   Peds  Hematology   Anesthesia Other Findings   Reproductive/Obstetrics                             Anesthesia Physical Anesthesia Plan  ASA: III  Anesthesia Plan: General   Post-op Pain Management:    Induction: Intravenous  Airway Management Planned: Oral ETT  Additional Equipment:   Intra-op Plan:   Post-operative Plan: Extubation in OR  Informed Consent: I have reviewed the patients History and Physical, chart, labs and discussed the procedure including the risks, benefits and alternatives for the proposed anesthesia with the patient or authorized representative who has indicated his/her understanding and acceptance.     Plan Discussed with: CRNA, Anesthesiologist and Surgeon  Anesthesia Plan Comments:         Anesthesia Quick Evaluation

## 2014-04-04 NOTE — Progress Notes (Signed)
[facility]

## 2014-04-04 NOTE — Progress Notes (Signed)
CT surgery p.m. Rounds  Status post TEVAR for descending saccular thoracic aneurysm. Patient complains of nausea. Spinal drain in place. Patient moving all extremities. No groin hematoma noted. Blood pressure has been high-prn Apresoline has been administered. We'll start on by mouth Norvasc and place order for IV nitroglycerin as needed IV Reglan ordered for nausea. Left chest tube with minimal drainage serous fluid

## 2014-04-04 NOTE — Progress Notes (Signed)
Patient ID: [name], female   DOB: June 25, 1935, 79 y.o.   MRN: [id number]  Hypertensive this afternoon and receiving prn meds. Complains of pain at chest tube site only. Good strength in legs. Feet warm and well-perfused with palpable dp and pt bilat. Will aim for MAP of 80  Spinal drain not hooked up to monitor yet.  CXR looks ok. Tube in good position. Left effusion drained. Still has bilateral lower lobe atelectasis.

## 2014-04-04 NOTE — Brief Op Note (Signed)
03/30/2014 - 04/04/2014  1:07 PM  PATIENT:  Stephanie Collier  79 y.o. 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 Collier BullockBarttle  SURGEON:  Co-Surgeons     * Alleen BorneBryan K Bartle, MD -    * Nada LibmanVance W Brabham, MD -    ANESTHESIA:   general  EBL:  Total I/O In: 250 [IV Piggyback:250] Out: 450 [Urine:450]  BLOOD ADMINISTERED:none  DRAINS: 28 F Chest Tube(s) in the left pleural space.   LOCAL MEDICATIONS USED:  NONE  SPECIMEN:  No Specimen  DISPOSITION OF SPECIMEN:  N/A  COUNTS:  YES  TOURNIQUET:  * No tourniquets in log *  DICTATION: .Note written in EPIC  PLAN OF CARE: Admit to inpatient   PATIENT DISPOSITION:  PACU - hemodynamically stable.   Delay start of Pharmacological VTE agent (>24hrs) due to surgical blood loss or risk of bleeding: yes

## 2014-04-04 NOTE — Transfer of Care (Signed)
Immediate Anesthesia Transfer of Care Note  Patient: Stephanie Collier  Procedure(s) Performed: Procedure(s) with comments: THORACIC AORTIC ENDOVASCULAR STENT GRAFT (N/A) CHEST TUBE INSERTION (Left) - by Dr. Collier BullockBarttle  Patient Location: PACU  Anesthesia Type:General  Level of Consciousness: awake, alert  and oriented  Airway & Oxygen Therapy: Patient Spontanous Breathing and Patient connected to face mask oxygen  Post-op Assessment: Report given to RN and Post -op Vital signs reviewed and stable  Post vital signs: Reviewed and stable  Last Vitals:  Filed Vitals:   04/04/14 0700  BP: 132/93  Pulse: 63  Temp: 36.7 C  Resp: 20    Complications: No apparent anesthesia complications

## 2014-04-04 NOTE — H&P (View-Only) (Signed)
    Subjective  -   Patient reports no discomfort in her right groin from her IVC filter placement yesterday.  Continues to have cough.   Physical Exam:  Pulmonary: Respirations are nonlabored.  She does have a productive cough. CV: Palpable pedal pulses Abdomen: Soft       Assessment/Plan:    The patient continues to have a productive cough,.  Her white blood cell count is normal.  She is having low-grade fevers.  Blood cultures have been negative.  UA was positive on 3/19.  She is currently on antibiotics.  Assuming she is cleared to proceed with surgery from her pulmonary physicians, we will proceed with endovascular repair of her penetrating thoracic aortic ulcer tomorrow (Thursday).  I detailed the risks and benefits of the procedure with the patient and her family yesterday.  The patient does not want to hear them again today.  I will repeat her UA today.  She will be nothing by mouth after midnight.  Hiawatha Merriott IV, V. WELLS 04/03/2014 9:10 AM --  Filed Vitals:   04/03/14 0845  BP: 125/58  Pulse:   Temp:   Resp:     Intake/Output Summary (Last 24 hours) at 04/03/14 0910 Last data filed at 04/03/14 0842  Gross per 24 hour  Intake    500 ml  Output    250 ml  Net    250 ml     Laboratory CBC    Component Value Date/Time   WBC 7.0 04/03/2014 0324   HGB 9.2* 04/03/2014 0324   HCT 29.4* 04/03/2014 0324   PLT 269 04/03/2014 0324    BMET    Component Value Date/Time   NA 136 04/03/2014 0324   K 3.6 04/03/2014 0324   CL 102 04/03/2014 0324   CO2 29 04/03/2014 0324   GLUCOSE 110* 04/03/2014 0324   BUN 14 04/03/2014 0324   CREATININE 0.48* 04/03/2014 0324   CALCIUM 8.0* 04/03/2014 0324   GFRNONAA >90 04/03/2014 0324   GFRAA >90 04/03/2014 0324    COAG Lab Results  Component Value Date   INR 1.02 08/10/2012   No results found for: PTT  Antibiotics Anti-infectives    Start     Dose/Rate Route Frequency Ordered Stop   04/01/14 1300   azithromycin (ZITHROMAX) 500 mg in dextrose 5 % 250 mL IVPB  Status:  Discontinued     500 mg 250 mL/hr over 60 Minutes Intravenous Every 24 hours 04/01/14 1149 04/01/14 1239   03/30/14 1400  cefTRIAXone (ROCEPHIN) 1 g in dextrose 5 % 50 mL IVPB     1 g 100 mL/hr over 30 Minutes Intravenous Every 24 hours 03/30/14 1316     03/30/14 0900  cefTRIAXone (ROCEPHIN) 1 g in dextrose 5 % 50 mL IVPB     1 g 100 mL/hr over 30 Minutes Intravenous  Once 03/30/14 0851 03/30/14 1119   03/30/14 0900  azithromycin (ZITHROMAX) 500 mg in dextrose 5 % 250 mL IVPB     500 mg 250 mL/hr over 60 Minutes Intravenous  Once 03/30/14 0851 03/30/14 1119       V. Charlena CrossWells Yailene Badia IV, M.D. [facility] [place] Office: [phone] Pager:  [phone]

## 2014-04-04 NOTE — Anesthesia Procedure Notes (Signed)
Procedure Name: Intubation Date/Time: 04/04/2014 11:15 AM Performed by: Gwenyth AllegraADAMI, Barrie Sigmund Pre-anesthesia Checklist: Emergency Drugs available, Patient identified, Suction available and Patient being monitored Patient Re-evaluated:Patient Re-evaluated prior to inductionOxygen Delivery Method: Circle system utilized Ventilation: Mask ventilation without difficulty Laryngoscope Size: Mac and 4 Grade View: Grade I Tube type: Subglottic suction tube Tube size: 7.0 mm Placement Confirmation: ETT inserted through vocal cords under direct vision,  positive ETCO2,  breath sounds checked- equal and bilateral and CO2 detector Secured at: 21 cm Tube secured with: Tape Dental Injury: Teeth and Oropharynx as per pre-operative assessment

## 2014-04-04 NOTE — Op Note (Signed)
Patient name: Stephanie Collier MRN: [id number] DOB: 03/30/1935 Sex: female  03/30/2014 - 04/04/2014 Pre-operative Diagnosis: Descending thoracic aortic aneurysm Post-operative diagnosis:  Same Surgeon:  Jorge Ny Co-Surgeon:  Evelene Croon Procedure:   #1: Ultrasound guided access, right femoral artery   #2: Catheter in aorta 2   #3: Thoracic aortic arch angiogram   #4: Endovascular repair of descending thoracic aortic aneurysm without coverage of the left subclavian artery   #5: Distal extension 1 Anesthesia:  Gen. Blood Loss:  See anesthesia record Specimens:  None  Findings:  Complete exclusion Devices used: Overlapping Gore CTAG 31x15  Indications:  This is a 79 year old female who presented to the hospital with back pain.  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  She continues to have pain.  She was also found to have a DVT and PE.  An IVC filter has been placed.  She is here today for repair of her thoracic aorta.  Procedure:  The patient was identified in the holding area and taken to [facility] OR ROOM 16  The patient was then placed supine on the table. general anesthesia was administered.  The patient was prepped and draped in the usual sterile fashion.  A time out was called and antibiotics were administered.  Ultrasound was used to evaluate the right common femoral artery which was found to be widely patent.  A digital ultrasound image was acquired.  A #11 blade was used to make a skin nick.  Under ultrasound guidance, the right common femoral artery was cannulated with an 18-gauge needle.  A 035 wire was advanced without resistance.  The subcutaneous tract was dilated with an 8 [place] dilator.  Pro-glide devices were deployed at the 11:00 and 1:00 position for pre-closure.  An 8 French sheath was placed.  The patient was then  fully heparinized.  A pigtail catheter was advanced into the ascending aorta.  A Lunderquist superstiff wire was then inserted.  A 22 French sheath was advanced into the aorta without resistance.  The initial piece was prepared on the back table.  This was a Gore CTAG 31 x 15 device.  Once this was positioned into the descending thoracic aorta, a second access was obtained in through the sheath.  The pigtail catheter was then advanced over the wire into the ascending aorta.  An aortic arch angiogram was performed with the image detector in a 50 LAO position.  The device was then deployed landing at the level of the left subclavian artery.  The pigtail catheter was then removed followed by the device.  A second device was then prepared.  This was a Gore CTAG 31 x 15 device.  It was then inserted through the sheath.  The pigtail catheter was then advanced into the initial piece and a distal thoracic aortic and 2 g was performed.  This located the celiac artery.  The device was then positioned inside the proximal device with a 5 cm overlap and then deployed.  Completion angiography was then performed which showed continued patency of the left subclavian artery with exclusion of the aneurysm.  I did not feel balloon molding was required.  The Lunderquist wire was then removed.  A 035 Bentson wire was then placed.  The sheath was then withdrawn and the arteriotomy closed by securing the previously deployed Pro-glide devices.  The groin wound was  hemostatic.  50 mg of protamine was given.  Manual pressure was then held for 5 minutes in the groin was hemostatic.  Fluoroscopy Vicryl was used to close the skin followed by Dermabond.  The patient had a palpable pedal pulse.  Dr. Laneta SimmersBartle then placed a chest tube.  The patient was then successfully awakened from anesthesia and taken the recovery room in stable condition.  She was moving all 4 extremities to command.   Disposition:  To PACU in stable condition.   Juleen ChinaV. Wells  Brabham, M.D. [facility] [place] Office: [phone] Pager:  [phone]

## 2014-04-05 ENCOUNTER — Inpatient Hospital Stay (HOSPITAL_COMMUNITY): Payer: Medicare Other

## 2014-04-05 LAB — BASIC METABOLIC PANEL
Anion gap: 5 (ref 5–15)
BUN: 7 mg/dL (ref 6–23)
CALCIUM: 7.9 mg/dL — AB (ref 8.4–10.5)
CO2: 27 mmol/L (ref 19–32)
Chloride: 107 mmol/L (ref 96–112)
Creatinine, Ser: 0.43 mg/dL — ABNORMAL LOW (ref 0.50–1.10)
Glucose, Bld: 122 mg/dL — ABNORMAL HIGH (ref 70–99)
Potassium: 3.1 mmol/L — ABNORMAL LOW (ref 3.5–5.1)
Sodium: 139 mmol/L (ref 135–145)

## 2014-04-05 LAB — CBC
HEMATOCRIT: 27 % — AB (ref 36.0–46.0)
Hemoglobin: 8.5 g/dL — ABNORMAL LOW (ref 12.0–15.0)
MCH: 29.7 pg (ref 26.0–34.0)
MCHC: 31.5 g/dL (ref 30.0–36.0)
MCV: 94.4 fL (ref 78.0–100.0)
Platelets: 279 10*3/uL (ref 150–400)
RBC: 2.86 MIL/uL — ABNORMAL LOW (ref 3.87–5.11)
RDW: 14.2 % (ref 11.5–15.5)
WBC: 7.1 10*3/uL (ref 4.0–10.5)

## 2014-04-05 LAB — CULTURE, BLOOD (ROUTINE X 2)
CULTURE: NO GROWTH
Culture: NO GROWTH

## 2014-04-05 LAB — GLUCOSE, CAPILLARY
GLUCOSE-CAPILLARY: 116 mg/dL — AB (ref 70–99)
GLUCOSE-CAPILLARY: 100 mg/dL — AB (ref 70–99)
Glucose-Capillary: 130 mg/dL — ABNORMAL HIGH (ref 70–99)
Glucose-Capillary: 108 mg/dL — ABNORMAL HIGH (ref 70–99)
Glucose-Capillary: 144 mg/dL — ABNORMAL HIGH (ref 70–99)

## 2014-04-05 MED ORDER — CEFTRIAXONE SODIUM IN DEXTROSE 20 MG/ML IV SOLN
1.0000 g | Freq: Two times a day (BID) | INTRAVENOUS | Status: DC
  Administered 2014-04-05 – 2014-04-06 (×4): 1 g via INTRAVENOUS
  Filled 2014-04-05 (×6): qty 50

## 2014-04-05 MED ORDER — POTASSIUM CHLORIDE 10 MEQ/50ML IV SOLN: 10.0000 meq | INTRAVENOUS | Status: DC

## 2014-04-05 MED ORDER — POTASSIUM CHLORIDE 10 MEQ/100ML IV SOLN
10.0000 meq | INTRAVENOUS | Status: AC
  Administered 2014-04-05 (×3): 10 meq via INTRAVENOUS
  Filled 2014-04-05 (×3): qty 100

## 2014-04-05 MED ORDER — POTASSIUM CHLORIDE 10 MEQ/100ML IV SOLN
10.0000 meq | INTRAVENOUS | Status: AC
  Administered 2014-04-05 (×2): 10 meq via INTRAVENOUS
  Filled 2014-04-05 (×2): qty 100

## 2014-04-05 MED ORDER — CEFTRIAXONE SODIUM 1 G IJ SOLR: 1.0000 g | Freq: Two times a day (BID) | INTRAMUSCULAR | Status: DC

## 2014-04-05 NOTE — Progress Notes (Signed)
Subjective  - POD #1, status post endovascular repair of descending thoracic aortic aneurysm  The patient has been complaining of chest discomfort, particularly in relation to her chest tube.  She has required a nonrebreather overnight. She denies any numbness or weakness in her lower extremities.   Physical Exam:  Right groin incision without complication.  She does have ecchymosis along the anterior thigh.  No bruit is felt.  She has palpable pedal pulses.  Excellent lower extremity strength  Poor respiratory effort   Assessment/Plan:  POD #1  Status post TEVAR:   Pulmonary: The patient has a poor respiratory effort with splinting given pain, likely related to chest tube.  She is now requiring a nonrebreather.  She is at risk for reintubation.  Pulmonary toilet will be critical.  Chest x-ray shows bilateral pleural effusions with atelectasis  Lumbar drain: Minimal fluid was drained overnight from her lumbar drain.  The patient remains neurologically intact.  I have recommended capping the lumbar drain.  If she does not have any neurologic compromise, this can be removed tomorrow.  Charese Abundis IV, V. WELLS 04/05/2014 8:09 AM --  Filed Vitals:   04/05/14 0800  BP: 132/56  Pulse: 86  Temp:   Resp: 18    Intake/Output Summary (Last 24 hours) at 04/05/14 0809 Last data filed at 04/05/14 0700  Gross per 24 hour  Intake 2781.25 ml  Output   2245 ml  Net 536.25 ml     Laboratory CBC    Component Value Date/Time   WBC 7.1 04/05/2014 0500   HGB 8.5* 04/05/2014 0500   HCT 27.0* 04/05/2014 0500   PLT 279 04/05/2014 0500    BMET    Component Value Date/Time   NA 139 04/05/2014 0500   K 3.1* 04/05/2014 0500   CL 107 04/05/2014 0500   CO2 27 04/05/2014 0500   GLUCOSE 122* 04/05/2014 0500   BUN 7 04/05/2014 0500   CREATININE 0.43* 04/05/2014 0500   CALCIUM 7.9* 04/05/2014 0500   GFRNONAA >90 04/05/2014 0500   GFRAA >90 04/05/2014 0500    COAG Lab Results    Component Value Date   INR 1.26 04/04/2014   INR 1.02 08/10/2012   No results found for: PTT  Antibiotics Anti-infectives    Start     Dose/Rate Route Frequency Ordered Stop   04/04/14 1130  cefUROXime (ZINACEF) 1.5 g in dextrose 5 % 50 mL IVPB  Status:  Discontinued     1.5 g 100 mL/hr over 30 Minutes Intravenous To Surgery 04/04/14 1125 04/04/14 1504   04/01/14 1300  azithromycin (ZITHROMAX) 500 mg in dextrose 5 % 250 mL IVPB  Status:  Discontinued     500 mg 250 mL/hr over 60 Minutes Intravenous Every 24 hours 04/01/14 1149 04/01/14 1239   03/30/14 1400  cefTRIAXone (ROCEPHIN) 1 g in dextrose 5 % 50 mL IVPB  Status:  Discontinued     1 g 100 mL/hr over 30 Minutes Intravenous Every 24 hours 03/30/14 1316 04/04/14 1504   03/30/14 0900  cefTRIAXone (ROCEPHIN) 1 g in dextrose 5 % 50 mL IVPB     1 g 100 mL/hr over 30 Minutes Intravenous  Once 03/30/14 0851 03/30/14 1119   03/30/14 0900  azithromycin (ZITHROMAX) 500 mg in dextrose 5 % 250 mL IVPB     500 mg 250 mL/hr over 60 Minutes Intravenous  Once 03/30/14 0851 03/30/14 1119       V. Charlena CrossWells Sybol Morre IV, M.D. [facility] [place] Office: [phone] Pager:  [phone]

## 2014-04-05 NOTE — Progress Notes (Addendum)
TCTS DAILY ICU PROGRESS NOTE                   [address]            [place] [address]          [phone]   1 Day Post-Op Procedure(s) (LRB): THORACIC AORTIC ENDOVASCULAR STENT GRAFT (N/A) CHEST TUBE INSERTION (Left)  Total Length of Stay:  LOS: 6 days   Subjective: Feels weak, some discomfort relieved with morphine  Objective: Vital signs in last 24 hours: Temp:  [97 F (36.1 C)-98.4 F (36.9 C)] 98.4 F (36.9 C) (03/25 0726) Pulse Rate:  [60-88] 86 (03/25 0800) Cardiac Rhythm:  [-] Normal sinus rhythm (03/25 0800) Resp:  [13-28] 18 (03/25 0800) BP: (115-159)/(52-89) 132/56 mmHg (03/25 0800) SpO2:  [89 %-100 %] 98 % (03/25 0800) Arterial Line BP: (119-170)/(52-116) 148/65 mmHg (03/25 0800)  Filed Weights   04/02/14 0420 04/03/14 0658 04/04/14 0500  Weight: 133 lb 11.2 oz (60.646 kg) 127 lb 9.6 oz (57.879 kg) 127 lb 6.4 oz (57.788 kg)    Weight change:    Hemodynamic parameters for last 24 hours:    Intake/Output from previous day: 03/24 0701 - 03/25 0700 In: 2781.3 [I.V.:2431.3; IV Piggyback:350] Out: 2245 [Urine:1705; Drains:20; Chest Tube:520]  Intake/Output this shift: Total I/O In: 175 [I.V.:75; IV Piggyback:100] Out: 40 [Urine:40]  Current Meds: Scheduled Meds: . amLODipine  5 mg Oral Daily  . docusate sodium  100 mg Oral Daily  . insulin aspart  0-24 Units Subcutaneous 6 times per day  . metoCLOPramide (REGLAN) injection  10 mg Intravenous 4 times per day  . pantoprazole  40 mg Oral Daily  . potassium chloride  10 mEq Intravenous Q1 Hr x 3   Continuous Infusions: . dextrose 5 % and 0.9% NaCl 75 mL/hr at 04/05/14 0700  . nitroGLYCERIN     PRN Meds:.acetaminophen **OR** acetaminophen, alum & mag hydroxide-simeth, bisacodyl, guaiFENesin-dextromethorphan, hydrALAZINE, labetalol, magnesium sulfate 1 - 4 g bolus IVPB, metoprolol, morphine injection, ondansetron, phenol, potassium chloride, senna-docusate, traMADol  General  appearance: fatigued and no distress Heart: regular rate and rhythm Lungs: dim in the bases Abdomen: soft, non-tender Extremities: warm and well perfused Wound: ok  Lab Results: CBC: Recent Labs  04/04/14 1416 04/05/14 0500  WBC 7.8 7.1  HGB 8.9* 8.5*  HCT 28.1* 27.0*  PLT 263 279   BMET:  Recent Labs  04/04/14 0625 04/05/14 0500  NA 138 139  K 3.6 3.1*  CL 101 107  CO2 30 27  GLUCOSE 106* 122*  BUN 11 7  CREATININE 0.42* 0.43*  CALCIUM 8.2* 7.9*    PT/INR:  Recent Labs  04/04/14 0625  LABPROT 15.9*  INR 1.26   Radiology: Dg Chest Port 1 View  04/04/2014   CLINICAL DATA:  79 year old female chest tube placement. Recently status post thoracic aortic endovascular stent graft treatment of penetrating aortic ulcers/dissection. Initial encounter.  EXAM: PORTABLE CHEST - 1 VIEW  COMPARISON:  04/02/2014 and earlier.  FINDINGS: Portable AP semi upright view at 1343 hours. New left chest tube in place. Mild regression of veiling opacity at the left lung base. No left pneumothorax identified.  New arch through descending aorta endograft device in place. Mediastinal contours appear stable.  Stable to increased veiling opacity at the right lung base. No acute pulmonary edema.  IMPRESSION: 1. Left chest tube placed.  No pneumothorax. 2. New thoracic aorta endograft. 3. Bilateral pleural effusions with bibasilar atelectasis or consolidation.  Electronically Signed   By: Odessa FlemingH  Hall M.D.   On: 04/04/2014 14:19     Assessment/Plan: S/P Procedure(s) (LRB): THORACIC AORTIC ENDOVASCULAR STENT GRAFT (N/A) CHEST TUBE INSERTION (Left)  1 chest tube- 480 ml out post op yesterday, minimal today- prob d/c soon 2 labs stable, will need some K+ replacement. Anemia is stable and chronic with mild blood loss.  3 Blood pressure appears to be well controlled with systolic in 120-130's for last 12 hours 4 will need aggressive pulm toilet/mobilization 5 she is neurologically intact with lumbar drain  that vasc surgery recommends capping   GOLD,WAYNE E 04/05/2014 8:39 AM Patient seen and examined, agree with above Primary issue remains marginal pulmonary status Restart IV ceftriaxone  Viviann SpareSteven C. Dorris FetchHendrickson, MD [facility] [phone]

## 2014-04-05 NOTE — Progress Notes (Signed)
      [address]       [place] [id number]             [phone]    Resting comfortably at present  BP 132/53 mmHg  Pulse 86  Temp(Src) 97.9 F (36.6 C) (Axillary)  Resp 14  Ht 5\' 2"  (1.575 m)  Wt 127 lb 6.4 oz (57.788 kg)  BMI 23.30 kg/m2  SpO2 100%   Intake/Output Summary (Last 24 hours) at 04/05/14 1808 Last data filed at 04/05/14 1700  Gross per 24 hour  Intake   2485 ml  Output   1035 ml  Net   1450 ml    Still on NRb with 12L O2  Pulmonary status remains tenuous  Viviann SpareSteven C. Dorris FetchHendrickson, MD [facility] [phone]'

## 2014-04-06 ENCOUNTER — Inpatient Hospital Stay (HOSPITAL_COMMUNITY): Payer: Medicare Other

## 2014-04-06 LAB — GLUCOSE, CAPILLARY
GLUCOSE-CAPILLARY: 112 mg/dL — AB (ref 70–99)
Glucose-Capillary: 142 mg/dL — ABNORMAL HIGH (ref 70–99)
Glucose-Capillary: 90 mg/dL (ref 70–99)
Glucose-Capillary: 134 mg/dL — ABNORMAL HIGH (ref 70–99)
Glucose-Capillary: 122 mg/dL — ABNORMAL HIGH (ref 70–99)
Glucose-Capillary: 158 mg/dL — ABNORMAL HIGH (ref 70–99)

## 2014-04-06 LAB — BASIC METABOLIC PANEL
Anion gap: 7 (ref 5–15)
BUN: <5 mg/dL — ABNORMAL LOW (ref 6–23)
CALCIUM: 8.2 mg/dL — AB (ref 8.4–10.5)
CO2: 29 mmol/L (ref 19–32)
Chloride: 101 mmol/L (ref 96–112)
Creatinine, Ser: 0.46 mg/dL — ABNORMAL LOW (ref 0.50–1.10)
GFR calc non Af Amer: >90 mL/min (ref >90)
Glucose, Bld: 138 mg/dL — ABNORMAL HIGH (ref 70–99)
POTASSIUM: 3.7 mmol/L (ref 3.5–5.1)
Sodium: 137 mmol/L (ref 135–145)

## 2014-04-06 LAB — CBC
HCT: 27.8 % — ABNORMAL LOW (ref 36.0–46.0)
HEMOGLOBIN: 8.7 g/dL — AB (ref 12.0–15.0)
MCH: 29.7 pg (ref 26.0–34.0)
MCHC: 31.3 g/dL (ref 30.0–36.0)
MCV: 94.9 fL (ref 78.0–100.0)
PLATELETS: 281 10*3/uL (ref 150–400)
RBC: 2.93 MIL/uL — AB (ref 3.87–5.11)
RDW: 14.3 % (ref 11.5–15.5)
WBC: 9.5 10*3/uL (ref 4.0–10.5)

## 2014-04-06 MED ORDER — ENOXAPARIN SODIUM 30 MG/0.3ML ~~LOC~~ SOLN
30.0000 mg | SUBCUTANEOUS | Status: DC
  Administered 2014-04-06 – 2014-04-13 (×8): 30 mg via SUBCUTANEOUS
  Filled 2014-04-06 (×8): qty 0.3

## 2014-04-06 MED ORDER — INSULIN ASPART 100 UNIT/ML ~~LOC~~ SOLN
0.0000 [IU] | Freq: Three times a day (TID) | SUBCUTANEOUS | Status: DC
Start: 2014-04-06 — End: 2014-04-13
  Administered 2014-04-06 – 2014-04-07 (×3): 1 [IU] via SUBCUTANEOUS
  Administered 2014-04-07 – 2014-04-09 (×4): 2 [IU] via SUBCUTANEOUS
  Administered 2014-04-09 – 2014-04-10 (×4): 1 [IU] via SUBCUTANEOUS
  Administered 2014-04-10: 2 [IU] via SUBCUTANEOUS
  Administered 2014-04-11 – 2014-04-12 (×2): 1 [IU] via SUBCUTANEOUS

## 2014-04-06 MED ORDER — POTASSIUM CHLORIDE 10 MEQ/100ML IV SOLN
10.0000 meq | INTRAVENOUS | Status: AC
  Administered 2014-04-06 (×3): 10 meq via INTRAVENOUS
  Filled 2014-04-06: qty 100

## 2014-04-06 NOTE — Progress Notes (Signed)
2 Days Post-Op Procedure(s) (LRB): THORACIC AORTIC ENDOVASCULAR STENT GRAFT (N/A) CHEST TUBE INSERTION (Left) Subjective: C/o cough  Objective: Vital signs in last 24 hours: Temp:  [97.9 F (36.6 C)-101 F (38.3 C)] 100.1 F (37.8 C) (03/26 0400) Pulse Rate:  [82-93] 84 (03/26 0800) Cardiac Rhythm:  [-] Normal sinus rhythm (03/26 0800) Resp:  [13-23] 15 (03/26 0800) BP: (113-150)/(51-81) 132/63 mmHg (03/26 0800) SpO2:  [93 %-100 %] 95 % (03/26 0800) Arterial Line BP: (97-157)/(55-152) 134/66 mmHg (03/26 0800) FiO2 (%):  [50 %] 50 % (03/26 0800)  Hemodynamic parameters for last 24 hours:    Intake/Output from previous day: 03/25 0701 - 03/26 0700 In: 2680 [P.O.:480; I.V.:1800; IV Piggyback:400] Out: 1055 [Urine:995; Chest Tube:60] Intake/Output this shift: Total I/O In: 100 [IV Piggyback:100] Out: -   General appearance: alert, cooperative and no distress Neurologic: intact Heart: regular rate and rhythm Lungs: rhonchi bilaterally Abdomen: normal findings: soft, non-tender Extremities: well perfused  Lab Results:  Recent Labs  04/05/14 0500 04/06/14 0420  WBC 7.1 9.5  HGB 8.5* 8.7*  HCT 27.0* 27.8*  PLT 279 281   BMET:  Recent Labs  04/05/14 0500 04/06/14 0420  NA 139 137  K 3.1* 3.7  CL 107 101  CO2 27 29  GLUCOSE 122* 138*  BUN 7 <5*  CREATININE 0.43* 0.46*  CALCIUM 7.9* 8.2*    PT/INR:  Recent Labs  04/04/14 0625  LABPROT 15.9*  INR 1.26   ABG No results found for: PHART, HCO3, TCO2, ACIDBASEDEF, O2SAT CBG (last 3)   Recent Labs  04/05/14 1936 04/05/14 2358 04/06/14 0408  GLUCAP 144* 112* 142*    Assessment/Plan: S/P Procedure(s) (LRB): THORACIC AORTIC ENDOVASCULAR STENT GRAFT (N/A) CHEST TUBE INSERTION (Left) -  Looks much better today  CV- stable  RESP- pneumonia- on ceftriaxone  Add flutter valve  RENAL- creatinine OK, supplement K  ENDO- CBG OK- change to AC/HS  OOB  Will add lovenox for DVT prophylaxis due to  immobility  Spinal drain can probably come out today- will d/w vascular   LOS: 7 days    Loreli SlotSteven C Geneviene Tesch 04/06/2014

## 2014-04-06 NOTE — Progress Notes (Signed)
      [address]       [place] [id number]             [phone]       Still relatively weak but did ambulate in hallway today  BP 125/55 mmHg  Pulse 87  Temp(Src) 98.2 F (36.8 C) (Axillary)  Resp 24  Ht 5\' 2"  (1.575 m)  Wt 127 lb 6.4 oz (57.788 kg)  BMI 23.30 kg/m2  SpO2 91%   Intake/Output Summary (Last 24 hours) at 04/06/14 1757 Last data filed at 04/06/14 1700  Gross per 24 hour  Intake   2260 ml  Output   1170 ml  Net   1090 ml    On ceftriaxone for pneumonia  Lumbar drain removed  Viviann SpareSteven C. Dorris FetchHendrickson, MD [facility] [phone]

## 2014-04-06 NOTE — Progress Notes (Signed)
Clarified with Neuro ICU, bedrest for a couple hrs after lumbar drain being pulling, also clarified with Homero FellersFrank from main pharmacy that it is ok to give lovenox after lumbar drain was pulled.  Will cont. To monitor and assess patient

## 2014-04-06 NOTE — Progress Notes (Signed)
Patient ID: Stephanie Collier, female   DOB: [birth year], 79 y.o.   MRN: [id number] Vascular Surgery Progress Note  Subjective: 2 days post TEVAR for aortic dissection. Patient complains of no weakness in the legs. Breathing more comfortably today. Chest tube removed earlier today per Dr. Dorris FetchHendrickson.  Objective:  Filed Vitals:   04/06/14 0900  BP: 102/73  Pulse: 95  Temp: 99.2 F (37.3 C)  Resp: 23    Gen. alert and oriented 3 Lower extremities with good strength-symmetrical-does have pain in left knee which is chronic. 2+ dorsalis pedis pulse palpable Lumbar drain in place   Labs:  Recent Labs Lab 04/04/14 0625 04/05/14 0500 04/06/14 0420  CREATININE 0.42* 0.43* 0.46*    Recent Labs Lab 04/04/14 0625 04/05/14 0500 04/06/14 0420  NA 138 139 137  K 3.6 3.1* 3.7  CL 101 107 101  CO2 30 27 29   BUN 11 7 <5*  CREATININE 0.42* 0.43* 0.46*  GLUCOSE 106* 122* 138*  CALCIUM 8.2* 7.9* 8.2*    Recent Labs Lab 04/04/14 1416 04/05/14 0500 04/06/14 0420  WBC 7.8 7.1 9.5  HGB 8.9* 8.5* 8.7*  HCT 28.1* 27.0* 27.8*  PLT 263 279 281    Recent Labs Lab 04/04/14 0625  INR 1.26    I/O last 3 completed shifts: In: 3680 [P.O.:480; I.V.:2700; IV Piggyback:500] Out: 1695 [Urine:1550; Drains:15; Chest Tube:130]  Imaging: Dg Chest Port 1 View  04/06/2014   CLINICAL DATA:  Acute onset of shortness of breath. Subsequent encounter.  EXAM: PORTABLE CHEST - 1 VIEW  COMPARISON:  Chest radiograph performed 04/05/2014  FINDINGS: The patient's retrocardiac airspace opacification is perhaps slightly worsened, with mildly worsened right basilar airspace opacity. This is concerning for mildly worsening multifocal pneumonia, given clinical concern. A small left pleural effusion is suspected. No pneumothorax is seen.  The cardiomediastinal silhouette is borderline enlarged. An aortic stent graft is again noted. An IVC filter is partially imaged. No acute osseous abnormalities are seen.   IMPRESSION: 1. Retrocardiac airspace opacification is perhaps slightly worsened, with mildly worsened right basilar airspace opacity. This is concerning for mildly worsening multifocal pneumonia, given clinical concern. 2. Suspect small left pleural effusion. Borderline cardiomegaly noted.   Electronically Signed   By: Roanna RaiderJeffery  Chang M.D.   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  EXAM: PORTABLE CHEST - 1 VIEW  COMPARISON:  CT 03/31/2014, 04/04/2014  FINDINGS: Cardiomediastinal silhouette unchanged in size and contour, with heart borders partially obscured by overlying lung and pleural disease.  Bibasilar opacity obscuring the bilateral hemidiaphragm and the retrocardiac region.  No pneumothorax identified, with unchanged position of left-sided thoracostomy tube.  Overlapping descending thoracic aortic Gore Tag endografts.  Low lung volumes with patchy airspace and interstitial disease bilaterally, slightly improved from the comparison.  Osteopenia.  No displaced fracture.  IMPRESSION: Similar appearance of the prior chest x-ray, with persisting bilateral pleural effusions and atelectasis.  Patchy bilateral airspace/ interstitial disease persists, slightly improved from the comparison.  Unchanged position of left-sided thoracostomy tube with no visualized pneumothorax.  Endovascular repair of descending thoracic aorta with overlapping Gore Tag endografts.  Signed,  Yvone NeuJaime S. Loreta AveWagner, DO  [facility]  [facility]   Electronically Signed   By: Gilmer MorJaime  Wagner D.O.   On: 04/05/2014 08:04   Dg Chest Port 1 View  04/04/2014   CLINICAL DATA:  79 year old female chest tube placement. Recently status post thoracic aortic endovascular stent graft treatment of penetrating aortic ulcers/dissection. Initial encounter.  EXAM: PORTABLE CHEST - 1 VIEW   COMPARISON:  04/02/2014 and earlier.  FINDINGS: Portable AP semi upright view at 1343 hours. New left chest tube in place. Mild regression of veiling opacity at the left lung base. No left pneumothorax identified.  New arch through descending aorta endograft device in place. Mediastinal contours appear stable.  Stable to increased veiling opacity at the right lung base. No acute pulmonary edema.  IMPRESSION: 1. Left chest tube placed.  No pneumothorax. 2. New thoracic aorta endograft. 3. Bilateral pleural effusions with bibasilar atelectasis or consolidation.   Electronically Signed   By: Odessa Fleming M.D.   On: 04/04/2014 14:19    Assessment/Plan:  POD #2   LOS: 7 days  s/p Procedure(s): THORACIC AORTIC ENDOVASCULAR STENT GRAFT CHEST TUBE INSERTION  Plan removal of lumbar drain today and begin mobilization of patient Making good progress   Josephina Gip, MD 04/06/2014 11:22 AM

## 2014-04-07 ENCOUNTER — Inpatient Hospital Stay (HOSPITAL_COMMUNITY): Payer: Medicare Other

## 2014-04-07 LAB — BASIC METABOLIC PANEL
Anion gap: 6 (ref 5–15)
BUN: <5 mg/dL — ABNORMAL LOW (ref 6–23)
CO2: 30 mmol/L (ref 19–32)
CREATININE: 0.43 mg/dL — AB (ref 0.50–1.10)
Calcium: 7.9 mg/dL — ABNORMAL LOW (ref 8.4–10.5)
Chloride: 100 mmol/L (ref 96–112)
GFR calc Af Amer: >90 mL/min (ref >90)
GFR calc non Af Amer: >90 mL/min (ref >90)
Glucose, Bld: 148 mg/dL — ABNORMAL HIGH (ref 70–99)
Potassium: 3.3 mmol/L — ABNORMAL LOW (ref 3.5–5.1)
Sodium: 136 mmol/L (ref 135–145)

## 2014-04-07 LAB — CBC
HEMATOCRIT: 27.4 % — AB (ref 36.0–46.0)
Hemoglobin: 8.6 g/dL — ABNORMAL LOW (ref 12.0–15.0)
MCH: 29.6 pg (ref 26.0–34.0)
MCHC: 31.4 g/dL (ref 30.0–36.0)
MCV: 94.2 fL (ref 78.0–100.0)
Platelets: 270 10*3/uL (ref 150–400)
RBC: 2.91 MIL/uL — ABNORMAL LOW (ref 3.87–5.11)
RDW: 14.2 % (ref 11.5–15.5)
WBC: 8.3 10*3/uL (ref 4.0–10.5)

## 2014-04-07 LAB — GLUCOSE, CAPILLARY
GLUCOSE-CAPILLARY: 142 mg/dL — AB (ref 70–99)
Glucose-Capillary: 185 mg/dL — ABNORMAL HIGH (ref 70–99)
Glucose-Capillary: 126 mg/dL — ABNORMAL HIGH (ref 70–99)
Glucose-Capillary: 119 mg/dL — ABNORMAL HIGH (ref 70–99)

## 2014-04-07 MED ORDER — POTASSIUM CHLORIDE 10 MEQ/100ML IV SOLN
10.0000 meq | INTRAVENOUS | Status: AC
  Administered 2014-04-07 (×4): 10 meq via INTRAVENOUS
  Filled 2014-04-07 (×4): qty 100

## 2014-04-07 MED ORDER — POTASSIUM CHLORIDE 20 MEQ/15ML (10%) PO SOLN
20.0000 meq | Freq: Every day | ORAL | Status: DC
  Administered 2014-04-07 – 2014-04-08 (×2): 20 meq via ORAL
  Filled 2014-04-07 (×2): qty 15

## 2014-04-07 MED ORDER — CEFTRIAXONE SODIUM IN DEXTROSE 20 MG/ML IV SOLN
1.0000 g | INTRAVENOUS | Status: AC
  Administered 2014-04-07 – 2014-04-10 (×4): 1 g via INTRAVENOUS
  Filled 2014-04-07 (×5): qty 50

## 2014-04-07 NOTE — Progress Notes (Signed)
3 Days Post-Op Procedure(s) (LRB): THORACIC AORTIC ENDOVASCULAR STENT GRAFT (N/A) CHEST TUBE INSERTION (Left) Subjective: Doesn't feel well  Objective: Vital signs in last 24 hours: Temp:  [98 F (36.7 C)-99.7 F (37.6 C)] 98 F (36.7 C) (03/27 0300) Pulse Rate:  [28-100] 28 (03/27 0700) Cardiac Rhythm:  [-] Normal sinus rhythm (03/27 0800) Resp:  [16-28] 28 (03/27 0700) BP: (120-151)/(50-72) 146/72 mmHg (03/27 0700) SpO2:  [91 %-99 %] 95 % (03/27 0700) Arterial Line BP: (155-172)/(69-77) 172/77 mmHg (03/26 1100) FiO2 (%):  [50 %] 50 % (03/26 1200)  Hemodynamic parameters for last 24 hours:    Intake/Output from previous day: 03/26 0701 - 03/27 0700 In: 2090 [P.O.:90; I.V.:1650; IV Piggyback:350] Out: 980 [Urine:980] Intake/Output this shift: Total I/O In: 175 [I.V.:75; IV Piggyback:100] Out: 85 [Urine:85]  General appearance: alert and no distress Neurologic: intact Heart: regular rate and rhythm Lungs: diminished breath sounds bibasilar Abdomen: normal findings: soft, non-tender  Lab Results:  Recent Labs  04/06/14 0420 04/07/14 0253  WBC 9.5 8.3  HGB 8.7* 8.6*  HCT 27.8* 27.4*  PLT 281 270   BMET:  Recent Labs  04/06/14 0420 04/07/14 0253  NA 137 136  K 3.7 3.3*  CL 101 100  CO2 29 30  GLUCOSE 138* 148*  BUN <5* <5*  CREATININE 0.46* 0.43*  CALCIUM 8.2* 7.9*    PT/INR: No results for input(s): LABPROT, INR in the last 72 hours. ABG No results found for: PHART, HCO3, TCO2, ACIDBASEDEF, O2SAT CBG (last 3)   Recent Labs  04/06/14 1253 04/06/14 1659 04/06/14 2211  GLUCAP 134* 122* 158*    Assessment/Plan: S/P Procedure(s) (LRB): THORACIC AORTIC ENDOVASCULAR STENT GRAFT (N/A) CHEST TUBE INSERTION (Left) -  CV- stable  RESP- has improved significantly over past 48 hours  Continue ceftriaxone, IS, flutter  RENAL- hypokalemia- supplement  ENDO- CBG Ok  Enoxaparin for DVT prophylaxis  Deconditioning is a significant issue-  mobilize   LOS: 8 days    Loreli SlotSteven C Dontarius Sheley 04/07/2014

## 2014-04-07 NOTE — Progress Notes (Addendum)
  Progress Note    04/07/2014 7:15 AM 3 Days Post-Op  Subjective:  C/o her back hurting and can't get comfortable - this has been ongoing; c/o dry heaves; wants to get back in bed.  Did walk in halls a little bit yesterday  Tm 99.7 now afebrile HR  80's-90's NSR 120's-150's systolic  99% 5LO2NC  Filed Vitals:   04/07/14 0600  BP: 138/68  Pulse: 80  Temp:   Resp: 19    Physical Exam: Cardiac:  regular Lungs:  CTAB Extremities:  2+ radial pulses bilaterally; 2+ DP pulses bilaterally Abdomen:  Soft, NT/ND  CBC    Component Value Date/Time   WBC 8.3 04/07/2014 0253   RBC 2.91* 04/07/2014 0253   HGB 8.6* 04/07/2014 0253   HCT 27.4* 04/07/2014 0253   PLT 270 04/07/2014 0253   MCV 94.2 04/07/2014 0253   MCH 29.6 04/07/2014 0253   MCHC 31.4 04/07/2014 0253   RDW 14.2 04/07/2014 0253   LYMPHSABS 1.4 04/01/2014 0430   MONOABS 1.0 04/01/2014 0430   EOSABS 0.0 04/01/2014 0430   BASOSABS 0.0 04/01/2014 0430    BMET    Component Value Date/Time   NA 136 04/07/2014 0253   K 3.3* 04/07/2014 0253   CL 100 04/07/2014 0253   CO2 30 04/07/2014 0253   GLUCOSE 148* 04/07/2014 0253   BUN <5* 04/07/2014 0253   CREATININE 0.43* 04/07/2014 0253   CALCIUM 7.9* 04/07/2014 0253   GFRNONAA >90 04/07/2014 0253   GFRAA >90 04/07/2014 0253    INR    Component Value Date/Time   INR 1.26 04/04/2014 0625     Intake/Output Summary (Last 24 hours) at 04/07/14 0715 Last data filed at 04/07/14 0600  Gross per 24 hour  Intake   2015 ml  Output    980 ml  Net   1035 ml     Assessment:  79 y.o. female is s/p:  TVAR 3 Days Post-Op  Plan: -lumbar drain removed yesterday -pt did ambulate in halls a little yesterday -dry heaves-receiving Zofran -Ceftriaxone for PNA -DVT prophylaxis:  Lovenox -continue to increase mobilization   Doreatha MassedSamantha Rhyne, PA-C [facility] [phone] 04/07/2014 7:15 AM    Continues to complain of her back hurting Did ambulate  short distance yesterday but states she is unable to ambulate to bathroom to void No change in lower extremity strength-no evidence of paraparesis  Plan DC Foley catheter in a.m. and continued to mobilize patient in surgical intensive care unit today

## 2014-04-07 NOTE — Progress Notes (Signed)
      [address]       [place] [id number]             [phone]       Resting comfortably  BP 123/62 mmHg  Pulse 83  Temp(Src) 98 F (36.7 C) (Oral)  Resp 21  Ht 5\' 2"  (1.575 m)  Wt 127 lb 6.4 oz (57.788 kg)  BMI 23.30 kg/m2  SpO2 96%   Intake/Output Summary (Last 24 hours) at 04/07/14 1846 Last data filed at 04/07/14 1630  Gross per 24 hour  Intake   2385 ml  Output    811 ml  Net   1574 ml    Has made some progress over the weekend, still very weak  Stephanie SpareSteven C. Dorris FetchHendrickson, MD [facility] [phone]

## 2014-04-08 ENCOUNTER — Inpatient Hospital Stay (HOSPITAL_COMMUNITY): Payer: Medicare Other

## 2014-04-08 ENCOUNTER — Encounter (HOSPITAL_COMMUNITY): Payer: Self-pay | Admitting: Surgery

## 2014-04-08 LAB — GLUCOSE, CAPILLARY
GLUCOSE-CAPILLARY: 141 mg/dL — AB (ref 70–99)
GLUCOSE-CAPILLARY: 110 mg/dL — AB (ref 70–99)
GLUCOSE-CAPILLARY: 112 mg/dL — AB (ref 70–99)
GLUCOSE-CAPILLARY: 172 mg/dL — AB (ref 70–99)
Glucose-Capillary: 129 mg/dL — ABNORMAL HIGH (ref 70–99)

## 2014-04-08 LAB — CBC
HCT: 26.1 % — ABNORMAL LOW (ref 36.0–46.0)
HEMOGLOBIN: 8.2 g/dL — AB (ref 12.0–15.0)
MCH: 29.1 pg (ref 26.0–34.0)
MCHC: 31.4 g/dL (ref 30.0–36.0)
MCV: 92.6 fL (ref 78.0–100.0)
PLATELETS: 277 10*3/uL (ref 150–400)
RBC: 2.82 MIL/uL — ABNORMAL LOW (ref 3.87–5.11)
RDW: 14.1 % (ref 11.5–15.5)
WBC: 8.3 10*3/uL (ref 4.0–10.5)

## 2014-04-08 LAB — TYPE AND SCREEN
ABO/RH(D): O POS
Antibody Screen: NEGATIVE
UNIT DIVISION: 0
Unit division: 0

## 2014-04-08 LAB — BASIC METABOLIC PANEL
Anion gap: 5 (ref 5–15)
BUN: <5 mg/dL — ABNORMAL LOW (ref 6–23)
CO2: 30 mmol/L (ref 19–32)
Calcium: 7.9 mg/dL — ABNORMAL LOW (ref 8.4–10.5)
Chloride: 100 mmol/L (ref 96–112)
Creatinine, Ser: 0.35 mg/dL — ABNORMAL LOW (ref 0.50–1.10)
GFR calc Af Amer: >90 mL/min (ref >90)
GFR calc non Af Amer: >90 mL/min (ref >90)
Glucose, Bld: 135 mg/dL — ABNORMAL HIGH (ref 70–99)
Potassium: 3.3 mmol/L — ABNORMAL LOW (ref 3.5–5.1)
Sodium: 135 mmol/L (ref 135–145)

## 2014-04-08 MED ORDER — POTASSIUM CHLORIDE CRYS ER 20 MEQ PO TBCR
40.0000 meq | EXTENDED_RELEASE_TABLET | Freq: Once | ORAL | Status: AC
  Administered 2014-04-08: 40 meq via ORAL
  Filled 2014-04-08: qty 2

## 2014-04-08 MED ORDER — POTASSIUM CHLORIDE CRYS ER 20 MEQ PO TBCR
20.0000 meq | EXTENDED_RELEASE_TABLET | Freq: Every day | ORAL | Status: DC
  Administered 2014-04-08 – 2014-04-13 (×6): 20 meq via ORAL
  Filled 2014-04-08 (×7): qty 1

## 2014-04-08 NOTE — Progress Notes (Signed)
4 Days Post-Op Procedure(s) (LRB): THORACIC AORTIC ENDOVASCULAR STENT GRAFT (N/A) CHEST TUBE INSERTION (Left) Subjective: Feels better today Still has a frequent cough  Objective: Vital signs in last 24 hours: Temp:  [98 F (36.7 C)-98.5 F (36.9 C)] 98.1 F (36.7 C) (03/28 0751) Pulse Rate:  [74-101] 81 (03/28 0900) Cardiac Rhythm:  [-] Normal sinus rhythm (03/28 0800) Resp:  [16-28] 22 (03/28 0900) BP: (89-141)/(35-80) 128/69 mmHg (03/28 0900) SpO2:  [93 %-99 %] 97 % (03/28 0900)  Hemodynamic parameters for last 24 hours:    Intake/Output from previous day: 03/27 0701 - 03/28 0700 In: 2970 [P.O.:600; I.V.:1800; IV Piggyback:450] Out: 981 [Urine:980; Stool:1] Intake/Output this shift: Total I/O In: 150 [I.V.:150] Out: -   General appearance: alert, cooperative and no distress Neurologic: intact Heart: regular rate and rhythm Lungs: diminished breath sounds bibasilar  Lab Results:  Recent Labs  04/07/14 0253 04/08/14 0257  WBC 8.3 8.3  HGB 8.6* 8.2*  HCT 27.4* 26.1*  PLT 270 277   BMET:  Recent Labs  04/07/14 0253 04/08/14 0257  NA 136 135  K 3.3* 3.3*  CL 100 100  CO2 30 30  GLUCOSE 148* 135*  BUN <5* <5*  CREATININE 0.43* 0.35*  CALCIUM 7.9* 7.9*    PT/INR: No results for input(s): LABPROT, INR in the last 72 hours. ABG No results found for: PHART, HCO3, TCO2, ACIDBASEDEF, O2SAT CBG (last 3)   Recent Labs  04/07/14 1625 04/07/14 2211 04/08/14 0749  GLUCAP 126* 119* 141*    Assessment/Plan: S/P Procedure(s) (LRB): THORACIC AORTIC ENDOVASCULAR STENT GRAFT (N/A) CHEST TUBE INSERTION (Left) Plan for transfer to step-down: see transfer orders   She has made significant progress over past 2 days Will plan to give 10 day course of ceftriaxone- day 8 today Back pain resolved Ambulated better today   LOS: 9 days    Loreli SlotSteven C [name] Ezekiel 04/08/2014

## 2014-04-08 NOTE — Progress Notes (Signed)
Report given to WilliamsvilleKisha, 2W-RN. Pt transferred to 2W-23 via wheelchair. Meds and belongings at bedside. Family notified of room change. VS stable at time of transfer. Bedside handoff to [place]Brandy, 2W RN. No questions or complaints at this time. Stephanie Collier L

## 2014-04-08 NOTE — Progress Notes (Signed)
    Subjective  - POD #4  States that her back pain has resolved. Breathing more comfortably   Physical Exam:  Good strength in bilateral lower extremity Palpable pedal pulses Nonlabored respirations    Assessment/Plan:  POD #4  ID: On ceftriaxone for possible pneumonia Encourage ambulation.  Needs to increase mobility. Remove Foley  BRABHAM IV, V. WELLS 04/08/2014 8:56 AM --  Filed Vitals:   04/08/14 0800  BP: 119/66  Pulse: 74  Temp:   Resp: 20    Intake/Output Summary (Last 24 hours) at 04/08/14 0856 Last data filed at 04/08/14 0800  Gross per 24 hour  Intake   2870 ml  Output    896 ml  Net   1974 ml     Laboratory CBC    Component Value Date/Time   WBC 8.3 04/08/2014 0257   HGB 8.2* 04/08/2014 0257   HCT 26.1* 04/08/2014 0257   PLT 277 04/08/2014 0257    BMET    Component Value Date/Time   NA 135 04/08/2014 0257   K 3.3* 04/08/2014 0257   CL 100 04/08/2014 0257   CO2 30 04/08/2014 0257   GLUCOSE 135* 04/08/2014 0257   BUN <5* 04/08/2014 0257   CREATININE 0.35* 04/08/2014 0257   CALCIUM 7.9* 04/08/2014 0257   GFRNONAA >90 04/08/2014 0257   GFRAA >90 04/08/2014 0257    COAG Lab Results  Component Value Date   INR 1.26 04/04/2014   INR 1.02 08/10/2012   No results found for: PTT  Antibiotics Anti-infectives    Start     Dose/Rate Route Frequency Ordered Stop   04/07/14 2000  cefTRIAXone (ROCEPHIN) 1 g in dextrose 5 % 50 mL IVPB - Premix     1 g 100 mL/hr over 30 Minutes Intravenous Every 24 hours 04/07/14 1124     04/05/14 1000  cefTRIAXone (ROCEPHIN) injection 1 g  Status:  Discontinued     1 g Intramuscular Every 12 hours 04/05/14 0941 04/05/14 0952   04/05/14 1000  cefTRIAXone (ROCEPHIN) 1 g in dextrose 5 % 50 mL IVPB - Premix  Status:  Discontinued     1 g 100 mL/hr over 30 Minutes Intravenous Every 12 hours 04/05/14 0953 04/07/14 1124   04/04/14 1130  cefUROXime (ZINACEF) 1.5 g in dextrose 5 % 50 mL IVPB  Status:   Discontinued     1.5 g 100 mL/hr over 30 Minutes Intravenous To Surgery 04/04/14 1125 04/04/14 1504   04/01/14 1300  azithromycin (ZITHROMAX) 500 mg in dextrose 5 % 250 mL IVPB  Status:  Discontinued     500 mg 250 mL/hr over 60 Minutes Intravenous Every 24 hours 04/01/14 1149 04/01/14 1239   03/30/14 1400  cefTRIAXone (ROCEPHIN) 1 g in dextrose 5 % 50 mL IVPB  Status:  Discontinued     1 g 100 mL/hr over 30 Minutes Intravenous Every 24 hours 03/30/14 1316 04/04/14 1504   03/30/14 0900  cefTRIAXone (ROCEPHIN) 1 g in dextrose 5 % 50 mL IVPB     1 g 100 mL/hr over 30 Minutes Intravenous  Once 03/30/14 0851 03/30/14 1119   03/30/14 0900  azithromycin (ZITHROMAX) 500 mg in dextrose 5 % 250 mL IVPB     500 mg 250 mL/hr over 60 Minutes Intravenous  Once 03/30/14 0851 03/30/14 1119       V. Charlena CrossWells Brabham IV, M.D. [facility] [place] Office: [phone] Pager:  [phone]

## 2014-04-09 DIAGNOSIS — E440 Moderate protein-calorie malnutrition: Secondary | ICD-10-CM | POA: Insufficient documentation

## 2014-04-09 LAB — BASIC METABOLIC PANEL
ANION GAP: 10 (ref 5–15)
BUN: <5 mg/dL — ABNORMAL LOW (ref 6–23)
CHLORIDE: 96 mmol/L (ref 96–112)
CO2: 30 mmol/L (ref 19–32)
Calcium: 8.1 mg/dL — ABNORMAL LOW (ref 8.4–10.5)
Creatinine, Ser: 0.47 mg/dL — ABNORMAL LOW (ref 0.50–1.10)
GFR calc Af Amer: >90 mL/min (ref >90)
GFR calc non Af Amer: >90 mL/min (ref >90)
Glucose, Bld: 124 mg/dL — ABNORMAL HIGH (ref 70–99)
Potassium: 2.8 mmol/L — ABNORMAL LOW (ref 3.5–5.1)
Sodium: 136 mmol/L (ref 135–145)

## 2014-04-09 LAB — GLUCOSE, CAPILLARY
GLUCOSE-CAPILLARY: 132 mg/dL — AB (ref 70–99)
GLUCOSE-CAPILLARY: 151 mg/dL — AB (ref 70–99)
GLUCOSE-CAPILLARY: 144 mg/dL — AB (ref 70–99)
GLUCOSE-CAPILLARY: 138 mg/dL — AB (ref 70–99)

## 2014-04-09 MED ORDER — BENZONATATE 100 MG PO CAPS
100.0000 mg | ORAL_CAPSULE | Freq: Three times a day (TID) | ORAL | Status: DC
  Administered 2014-04-09 – 2014-04-10 (×3): 100 mg via ORAL
  Filled 2014-04-09 (×14): qty 1

## 2014-04-09 MED ORDER — POTASSIUM CHLORIDE 10 MEQ/100ML IV SOLN
10.0000 meq | INTRAVENOUS | Status: AC
  Administered 2014-04-09 (×3): 10 meq via INTRAVENOUS
  Filled 2014-04-09 (×3): qty 100

## 2014-04-09 MED ORDER — POTASSIUM CHLORIDE CRYS ER 20 MEQ PO TBCR
40.0000 meq | EXTENDED_RELEASE_TABLET | Freq: Once | ORAL | Status: AC
  Administered 2014-04-09: 40 meq via ORAL
  Filled 2014-04-09: qty 2

## 2014-04-09 MED ORDER — ENSURE ENLIVE PO LIQD
237.0000 mL | Freq: Two times a day (BID) | ORAL | Status: DC
  Administered 2014-04-09 – 2014-04-12 (×2): 237 mL via ORAL

## 2014-04-09 MED ORDER — HYDRALAZINE HCL 20 MG/ML IJ SOLN: 10.0000 mg | Freq: Four times a day (QID) | INTRAMUSCULAR | Status: DC | PRN

## 2014-04-09 MED ORDER — LABETALOL HCL 200 MG PO TABS
200.0000 mg | ORAL_TABLET | Freq: Two times a day (BID) | ORAL | Status: DC
  Administered 2014-04-09 – 2014-04-11 (×5): 200 mg via ORAL
  Filled 2014-04-09 (×8): qty 1

## 2014-04-09 MED ORDER — FUROSEMIDE 10 MG/ML IJ SOLN
40.0000 mg | Freq: Once | INTRAMUSCULAR | Status: AC
  Administered 2014-04-09: 40 mg via INTRAVENOUS
  Filled 2014-04-09: qty 4

## 2014-04-09 MED ORDER — GUAIFENESIN ER 600 MG PO TB12
600.0000 mg | ORAL_TABLET | Freq: Two times a day (BID) | ORAL | Status: DC
  Filled 2014-04-09 (×10): qty 1

## 2014-04-09 NOTE — Evaluation (Signed)
Physical Therapy Evaluation Patient Details Name: Stephanie Collier MRN: [id number] DOB: 01/10/1936 Today's Date: 04/09/2014   History of Present Illness  79 y.o. female s/p THORACIC AORTIC ENDOVASCULAR STENT GRAFT.  Clinical Impression  Patient is s/p above surgery presenting with functional limitations due to the deficits listed below (see PT Problem List). Limited assessment due to nausea upon standing but no emesis. Pt adamant to return to bed, very lethargic but oriented. Explained importance of being out of bed as much as she can tolerate but continues to refuse to ambulate or sit up in chair at this time. She states she has 24 hour supervision at home from her daughter. Patient will benefit from skilled PT to increase their independence and safety with mobility to allow discharge to the venue listed below.       Follow Up Recommendations Home health PT;Supervision/Assistance - 24 hour    Equipment Recommendations  Rolling walker with 5" wheels    Recommendations for Other Services       Precautions / Restrictions Precautions Precautions: Fall Precaution Comments: monitor O2 Restrictions Weight Bearing Restrictions: No      Mobility  Bed Mobility Overal bed mobility: Needs Assistance Bed Mobility: Supine to Sit;Sit to Supine     Supine to sit: Supervision Sit to supine: Supervision   General bed mobility comments: Supervision for safety. Cues for technique. Very slow and lethargic but did not require physical assist.  Transfers Overall transfer level: Needs assistance Equipment used: Rolling walker (2 wheeled) Transfers: Sit to/from Stand Sit to Stand: Supervision         General transfer comment: Supervision for safety with VC for hand placement. Only tolerated standing for several seconds due to nausea, needing to sit.  Ambulation/Gait                Stairs            Wheelchair Mobility    Modified Rankin (Stroke Patients Only)       Balance  Overall balance assessment: Needs assistance Sitting-balance support: No upper extremity supported;Feet supported Sitting balance-Leahy Scale: Good     Standing balance support: No upper extremity supported;During functional activity Standing balance-Leahy Scale: Fair                               Pertinent Vitals/Pain Pain Assessment: No/denies pain  SpO2 95% on 2.5L supplemental O2, HR 66    Home Living Family/patient expects to be discharged to:: Private residence Living Arrangements: Children (daughter) Available Help at Discharge: Family;Available 24 hours/day Type of Home: House Home Access: Stairs to enter Entrance Stairs-Rails: Left Entrance Stairs-Number of Steps: 3 Home Layout: One level Home Equipment: None      Prior Function Level of Independence: Independent               Hand Dominance   Dominant Hand: Right    Extremity/Trunk Assessment   Upper Extremity Assessment: Defer to OT evaluation           Lower Extremity Assessment: Generalized weakness         Communication   Communication: No difficulties  Cognition Arousal/Alertness: Lethargic Behavior During Therapy: WFL for tasks assessed/performed Overall Cognitive Status: Within Functional Limits for tasks assessed                      General Comments General comments (skin integrity, edema, etc.): Limited by nausea. RN notified. Refuses to  sit upright in chair, HOB elevated to 30 degrees. pt with intermittent coughing.    Exercises        Assessment/Plan    PT Assessment Patient needs continued PT services  PT Diagnosis Difficulty walking;Generalized weakness   PT Problem List Decreased strength;Decreased activity tolerance;Decreased balance;Decreased mobility;Decreased knowledge of use of DME  PT Treatment Interventions DME instruction;Gait training;Stair training;Functional mobility training;Therapeutic activities;Therapeutic exercise;Balance  training;Neuromuscular re-education;Patient/family education   PT Goals (Current goals can be found in the Care Plan section) Acute Rehab PT Goals Patient Stated Goal: none stated PT Goal Formulation: With patient Time For Goal Achievement: 04/23/14 Potential to Achieve Goals: Good    Frequency Min 3X/week   Barriers to discharge        Co-evaluation               End of Session Equipment Utilized During Treatment: Gait belt;Oxygen Activity Tolerance: Other (comment);Patient limited by lethargy (Patient limited by nausea) Patient left: in bed;with call bell/phone within reach;with bed alarm set Nurse Communication: Mobility status;Other (comment) (nausea)         Time: [id number] PT Time Calculation (min) (ACUTE ONLY): 16 min   Charges:   PT Evaluation $Initial PT Evaluation Tier I: 1 Procedure     PT G Codes:        Berton MountBarbour, Nollie Shiflett S 04/09/2014, 11:30 AM Charlsie MerlesLogan Secor Akaya Proffit, PT [phone]

## 2014-04-09 NOTE — Progress Notes (Signed)
INITIAL NUTRITION ASSESSMENT  DOCUMENTATION CODES Per approved criteria  -Severe malnutrition in the context of acute illness or injury   INTERVENTION:  Recommend liberalizing diet to Regular  Recommend screening for depression  Ensure Enlive po BID, each supplement provides 350 kcal and 20 grams of protein  NUTRITION DIAGNOSIS: Malnutrition related to acute illness as evidenced by po intake </= 50% of her needs in >/= 5 days and moderate muscle depletion.   Goal: Pt to meet >/= 90% of their estimated nutrition needs   Monitor:  PO intake, supplement acceptance, weight trends, labs   Reason for Assessment: Poor PO consult  ASSESSMENT: Pt s/p thoracic aortic endovascular stent graft and chest tube insertion.   Pt on hospital day # 10.  Pt reports that the month prior to admission pt had began to eat less because she did not feel well.  She has ate only bites at meals since admission. She felt her intake had improved this am, but she only had juice at breakfast.  Pt endorses no appetite.  Pt did not open her eyes during our conversation and was very flat. Explained the importance of adequate nutrition for healing to pt. Pt willing to try ensure.   Nutrition Focused Physical Exam:  Subcutaneous Fat:  Orbital Region: WDL Upper Arm Region: WDL Thoracic and Lumbar Region: WDL  Muscle:  Temple Region: mild/moderate depletion Clavicle Bone Region: WDL Clavicle and Acromion Bone Region: mild/moderate depltione Scapular Bone Region: mild/moderate depletion Dorsal Hand: mild/moderate depletion Patellar Region: WDl Anterior Thigh Region: mild/moderate depletion Posterior Calf Region: mild/moderate depletion  Edema: not present    Height: Ht Readings from Last 1 Encounters:  03/30/14 5\' 2"  (1.575 m)    Weight: Wt Readings from Last 1 Encounters:  04/09/14 134 lb 11.2 oz (61.1 kg)  Admission weight: 121 lb 3/19  Ideal Body Weight: 50 kg   % Ideal Body Weight:  122%  Wt Readings from Last 10 Encounters:  04/09/14 134 lb 11.2 oz (61.1 kg)    Usual Body Weight: 124 lb per pt   % Usual Body Weight: >100%  BMI:  Body mass index is 24.63 kg/(m^2).  Estimated Nutritional Needs: Kcal: 1400-1600 Protein: 70-80 grams Fluid: >1.5 L/day  Skin: incisions  Diet Order: Diet heart healthy/carb modified Room service appropriate?: Yes; Fluid consistency:: Thin  EDUCATION NEEDS: -No education needs identified at this time   Intake/Output Summary (Last 24 hours) at 04/09/14 1112 Last data filed at 04/09/14 1056  Gross per 24 hour  Intake    865 ml  Output   1100 ml  Net   -235 ml    Last BM: 3/29, loose    Labs:   Recent Labs Lab 04/07/14 0253 04/08/14 0257 04/09/14 0854  NA 136 135 136  K 3.3* 3.3* 2.8*  CL 100 100 96  CO2 30 30 30   BUN <5* <5* <5*  CREATININE 0.43* 0.35* 0.47*  CALCIUM 7.9* 7.9* 8.1*  GLUCOSE 148* 135* 124*    CBG (last 3)   Recent Labs  04/08/14 1632 04/08/14 2210 04/09/14 0606  GLUCAP 172* 129* 132*    Scheduled Meds: . amLODipine  5 mg Oral Daily  . cefTRIAXone (ROCEPHIN)  IV  1 g Intravenous Q24H  . docusate sodium  100 mg Oral Daily  . enoxaparin (LOVENOX) injection  30 mg Subcutaneous Q24H  . guaiFENesin  600 mg Oral BID  . insulin aspart  0-9 Units Subcutaneous TID WC  . labetalol  200 mg Oral BID  .  metoCLOPramide (REGLAN) injection  10 mg Intravenous 4 times per day  . pantoprazole  40 mg Oral Daily  . potassium chloride  10 mEq Intravenous Q1 Hr x 3  . potassium chloride  20 mEq Oral Daily    Continuous Infusions: . dextrose 5 % and 0.9% NaCl 75 mL/hr at 04/08/14 2303   [facility] RD, LDN, CNSC [phone] Pager [phone] After Hours Pager

## 2014-04-09 NOTE — Progress Notes (Signed)
Per PA hold 3 runs of K+ until recheck of K+ lab drawn. Louie BunWilson,Kanya Potteiger S 8:04 AM

## 2014-04-09 NOTE — Progress Notes (Addendum)
Pt requested for bed linens to be changed, RN suggested she get up out of bed to chair but pt refused to get out of bed and requested the bed be changed while she is in it. Educated on the benefits of moving around in room, ambulating in halls and using IS and flutter valve, pt refuses and does not seem very motivated, will continue to encourage and monitor. Louie BunWilson,Orhan Mayorga S 2:22 PM

## 2014-04-09 NOTE — Progress Notes (Signed)
Utilization review completed.  

## 2014-04-09 NOTE — Progress Notes (Addendum)
  Progress Note    04/09/2014 10:46 AM 5 Days Post-Op  Subjective:  No complaints at this time  Afebrile 130's-150's systolic HR 90's-100's NSR 93% 3LO2NC  ABx: Rocephin  Filed Vitals:   04/09/14 1016  BP: 126/63  Pulse: 92  Temp:   Resp:     Physical Exam: Not examined at this time due to getting a bath by CNA.  CBC    Component Value Date/Time   WBC 8.3 04/08/2014 0257   RBC 2.82* 04/08/2014 0257   HGB 8.2* 04/08/2014 0257   HCT 26.1* 04/08/2014 0257   PLT 277 04/08/2014 0257   MCV 92.6 04/08/2014 0257   MCH 29.1 04/08/2014 0257   MCHC 31.4 04/08/2014 0257   RDW 14.1 04/08/2014 0257   LYMPHSABS 1.4 04/01/2014 0430   MONOABS 1.0 04/01/2014 0430   EOSABS 0.0 04/01/2014 0430   BASOSABS 0.0 04/01/2014 0430    BMET    Component Value Date/Time   NA 136 04/09/2014 0854   K 2.8* 04/09/2014 0854   CL 96 04/09/2014 0854   CO2 30 04/09/2014 0854   GLUCOSE 124* 04/09/2014 0854   BUN <5* 04/09/2014 0854   CREATININE 0.47* 04/09/2014 0854   CALCIUM 8.1* 04/09/2014 0854   GFRNONAA >90 04/09/2014 0854   GFRAA >90 04/09/2014 0854    INR    Component Value Date/Time   INR 1.26 04/04/2014 0625     Intake/Output Summary (Last 24 hours) at 04/09/14 1046 Last data filed at 04/09/14 1027  Gross per 24 hour  Intake    940 ml  Output    800 ml  Net    140 ml     Assessment:  79 y.o. female is s/p:  TVAR  5 Days Post-Op  Plan: -will come back later to see pt -DVT prophylaxis:  Lovenox   Doreatha MassedSamantha Rhyne, PA-C [facility] [phone] 04/09/2014 10:46 AM    Addendum  Patient examined following bath. Denies CP, SOB, back pain and abdominal pain. Still having cough.  General: resting in bed in NAD Lungs: coarse breath sounds bilaterally Heart: RRR Abdomen: soft, non tender Extremities: 2+ radial and dorsalis pedis pulses bilaterally.   Plan: Continue abx for HCAP.  Encourage mobilization.   Maris BergerKimberly Trinh,  [place] JerseyPA-C [phone]   I agree with the above.  I have seen and examined the patient.  Her right groin is soft.  Her back pain has resolved.  She is somewhat nausea from potassium she has received.  Her appetite is poor.  She would like a diaper given that she is going to the bathroom a lot and cannot make it to the bathroom.  She is been seen and evaluated by physical therapy and is okay to walk with a 5 inch wheel walker.  Will decrease IV fluids. Repeat labs in the morning to monitor potassium   Wells Elad Macphail

## 2014-04-09 NOTE — Progress Notes (Addendum)
      [address]       [place] [id number]             [phone]      5 Days Post-Op Procedure(s) (LRB): THORACIC AORTIC ENDOVASCULAR STENT GRAFT (N/A) CHEST TUBE INSERTION (Left)   Subjective:  Ms. Stephanie Collier continues to complain of cough.  She also has some shortness of breath.  She continues to not eat and is not getting out of bed or ambulating much.  Objective: Vital signs in last 24 hours: Temp:  [98.1 F (36.7 C)-98.4 F (36.9 C)] 98.1 F (36.7 C) (03/29 0322) Pulse Rate:  [74-100] 100 (03/29 0322) Cardiac Rhythm:  [-] Normal sinus rhythm (03/29 0743) Resp:  [17-23] 22 (03/29 0322) BP: (119-153)/(60-75) 153/73 mmHg (03/29 0322) SpO2:  [93 %-99 %] 93 % (03/29 0322) Weight:  [134 lb 11.2 oz (61.1 kg)] 134 lb 11.2 oz (61.1 kg) (03/29 0322)  Intake/Output from previous day: 03/28 0701 - 03/29 0700 In: 1045 [P.O.:220; I.V.:825] Out: 300 [Urine:300]  General appearance: alert, cooperative and no distress Heart: regular rate and rhythm Lungs: coarse bilaterally, diminished at the bases Abdomen: soft, non-tender; bowel sounds normal; no masses,  no organomegaly Extremities: edema trace Wound: clean and dry  Lab Results:  Recent Labs  04/07/14 0253 04/08/14 0257  WBC 8.3 8.3  HGB 8.6* 8.2*  HCT 27.4* 26.1*  PLT 270 277   BMET:  Recent Labs  04/07/14 0253 04/08/14 0257  NA 136 135  K 3.3* 3.3*  CL 100 100  CO2 30 30  GLUCOSE 148* 135*  BUN <5* <5*  CREATININE 0.43* 0.35*  CALCIUM 7.9* 7.9*    PT/INR: No results for input(s): LABPROT, INR in the last 72 hours. ABG No results found for: PHART, HCO3, TCO2, ACIDBASEDEF, O2SAT CBG (last 3)   Recent Labs  04/08/14 1632 04/08/14 2210 04/09/14 0606  GLUCAP 172* 129* 132*    Assessment/Plan: S/P Procedure(s) (LRB): THORACIC AORTIC ENDOVASCULAR STENT GRAFT (N/A) CHEST TUBE INSERTION (Left)  1. CV- Tachycardic, HTN- on Norvasc at 5 mg daily, all other agents have expired, will start  Labetolol 200mg  BID and have Hydralazine 10 mg Q6 prn 2. Pulm- + cough, remains on oxygen- wean as tolerated, order Mucinex, will order prn nebs 3. Renal- hypokalemic yesterday, + hypervolemia- will give IV Lasix today, repeat BMET if remains hypokalemic will replace 4. ID- HCAP, on Rocephin Day 9/10 5. Dispo- patient tachycardic and hypertensive adjusted medications, + cough will continue Rocephin, Mucinex, Lasix for Hypervolemia, PT consult placed patient needs to ambulate and get out of bed, nutrition consult placed by nursing   LOS: 10 days    BARRETT, ERIN 04/09/2014  Complains of not feeling like eating with nausea no vomiting I have seen and examined [place] CarboJanice Plude and agree with the above assessment  and plan.  Delight OvensEdward B Robet Crutchfield MD [facility] [phone] Office [phone] 04/09/2014 5:49 PM

## 2014-04-10 LAB — CLOSTRIDIUM DIFFICILE BY PCR: Toxigenic C. Difficile by PCR: NEGATIVE

## 2014-04-10 LAB — BASIC METABOLIC PANEL
ANION GAP: 6 (ref 5–15)
BUN: 5 mg/dL — ABNORMAL LOW (ref 6–23)
CHLORIDE: 101 mmol/L (ref 96–112)
CO2: 31 mmol/L (ref 19–32)
Calcium: 8.2 mg/dL — ABNORMAL LOW (ref 8.4–10.5)
Creatinine, Ser: 0.43 mg/dL — ABNORMAL LOW (ref 0.50–1.10)
GLUCOSE: 116 mg/dL — AB (ref 70–99)
POTASSIUM: 4 mmol/L (ref 3.5–5.1)
Sodium: 138 mmol/L (ref 135–145)

## 2014-04-10 LAB — GLUCOSE, CAPILLARY
GLUCOSE-CAPILLARY: 125 mg/dL — AB (ref 70–99)
GLUCOSE-CAPILLARY: 140 mg/dL — AB (ref 70–99)
GLUCOSE-CAPILLARY: 155 mg/dL — AB (ref 70–99)
Glucose-Capillary: 98 mg/dL (ref 70–99)

## 2014-04-10 MED ORDER — LOPERAMIDE HCL 2 MG PO CAPS
4.0000 mg | ORAL_CAPSULE | ORAL | Status: DC | PRN
  Administered 2014-04-10: 4 mg via ORAL
  Filled 2014-04-10: qty 2

## 2014-04-10 NOTE — Progress Notes (Signed)
      [address]       [place] [id number]             [phone]      6 Days Post-Op Procedure(s) (LRB): THORACIC AORTIC ENDOVASCULAR STENT GRAFT (N/A) CHEST TUBE INSERTION (Left) Subjective: Says her cough is less productive Feeling a little stronger Nutrition is poor  Objective: Vital signs in last 24 hours: Temp:  [97.5 F (36.4 C)-98 F (36.7 C)] 98 F (36.7 C) (03/30 0429) Pulse Rate:  [66-92] 74 (03/30 0429) Cardiac Rhythm:  [-] Normal sinus rhythm (03/30 0720) Resp:  [21-23] 23 (03/30 0429) BP: (100-126)/(47-63) 121/55 mmHg (03/30 0429) SpO2:  [96 %-97 %] 96 % (03/30 0429) Weight:  [131 lb 13.4 oz (59.8 kg)] 131 lb 13.4 oz (59.8 kg) (03/30 0429)  Hemodynamic parameters for last 24 hours:    Intake/Output from previous day: 03/29 0701 - 03/30 0700 In: 1084 [P.O.:460; I.V.:624] Out: 1300 [Urine:1300] Intake/Output this shift:    General appearance: alert, cooperative, fatigued and no distress Heart: regular rate and rhythm Lungs: ronchi in upper airway , lears with cough, dim in bases Abdomen: benign Extremities: no edema Wound: groin site without hematoma  Lab Results:  Recent Labs  04/08/14 0257  WBC 8.3  HGB 8.2*  HCT 26.1*  PLT 277   BMET:  Recent Labs  04/09/14 0854 04/10/14 0406  NA 136 138  K 2.8* 4.0  CL 96 101  CO2 30 31  GLUCOSE 124* 116*  BUN <5* 5*  CREATININE 0.47* 0.43*  CALCIUM 8.1* 8.2*    PT/INR: No results for input(s): LABPROT, INR in the last 72 hours. ABG No results found for: PHART, HCO3, TCO2, ACIDBASEDEF, O2SAT CBG (last 3)   Recent Labs  04/09/14 1627 04/09/14 2123 04/10/14 0653  GLUCAP 144* 138* 125*    Meds Scheduled Meds: . amLODipine  5 mg Oral Daily  . benzonatate  100 mg Oral TID  . cefTRIAXone (ROCEPHIN)  IV  1 g Intravenous Q24H  . docusate sodium  100 mg Oral Daily  . enoxaparin (LOVENOX) injection  30 mg Subcutaneous Q24H  . feeding supplement (ENSURE ENLIVE)  237 mL  Oral BID BM  . guaiFENesin  600 mg Oral BID  . insulin aspart  0-9 Units Subcutaneous TID WC  . labetalol  200 mg Oral BID  . metoCLOPramide (REGLAN) injection  10 mg Intravenous 4 times per day  . pantoprazole  40 mg Oral Daily  . potassium chloride  20 mEq Oral Daily   Continuous Infusions: . dextrose 5 % and 0.9% NaCl 10 mL/hr at 04/09/14 1542   PRN Meds:.acetaminophen **OR** acetaminophen, alum & mag hydroxide-simeth, bisacodyl, guaiFENesin-dextromethorphan, hydrALAZINE, magnesium sulfate 1 - 4 g bolus IVPB, morphine injection, ondansetron, phenol, senna-docusate, traMADol  Xrays No results found.  Assessment/Plan: S/P Procedure(s) (LRB): THORACIC AORTIC ENDOVASCULAR STENT GRAFT (N/A) CHEST TUBE INSERTION (Left)  1 progressing slowly 2 Bp control is much better 3 cont aggressive pulm toilet, Rocephin day 10/10 4 push rehab/PT  5 Renal fxn normal 6 change to regular diet  LOS: 11 days    Rickeya Manus E 04/10/2014

## 2014-04-10 NOTE — Progress Notes (Signed)
PT Cancellation Note  Patient Details Name: Stephanie CarboJanice Beckel MRN: [id number] DOB: [birth year]   Cancelled Treatment:    Reason Eval/Treat Not Completed: Patient declined, no reason specified Patient refused to ambulate with PT again today. States she has diarrhea and will not get out of bed. Reiterated the importance of working with therapy as able to prevent secondary complications and to progress her functional abilities while hospitalized. States she will walk with nursing this evening; continues to refuse PT at this time.  Berton MountBarbour, Nyomi Howser S 04/10/2014, 11:48 AM Charlsie MerlesLogan Secor Tamesha Ellerbrock, PT [phone]

## 2014-04-10 NOTE — Progress Notes (Signed)
  Progress Note    04/10/2014 8:22 AM 6 Days Post-Op  Subjective:  Feeling better; walked yesterday in circle; eating a little more today.  Afebrile 100's-120's systolic HR 60's-70's NSR 96% [id number]  Filed Vitals:   04/10/14 0429  BP: 121/55  Pulse: 74  Temp: 98 F (36.7 C)  Resp: 23    Physical Exam: Cardiac:  Regular Lungs:  Non labored Extremities:  2+ bilateral DP/radial pulses   CBC    Component Value Date/Time   WBC 8.3 04/08/2014 0257   RBC 2.82* 04/08/2014 0257   HGB 8.2* 04/08/2014 0257   HCT 26.1* 04/08/2014 0257   PLT 277 04/08/2014 0257   MCV 92.6 04/08/2014 0257   MCH 29.1 04/08/2014 0257   MCHC 31.4 04/08/2014 0257   RDW 14.1 04/08/2014 0257   LYMPHSABS 1.4 04/01/2014 0430   MONOABS 1.0 04/01/2014 0430   EOSABS 0.0 04/01/2014 0430   BASOSABS 0.0 04/01/2014 0430    BMET    Component Value Date/Time   NA 138 04/10/2014 0406   K 4.0 04/10/2014 0406   CL 101 04/10/2014 0406   CO2 31 04/10/2014 0406   GLUCOSE 116* 04/10/2014 0406   BUN 5* 04/10/2014 0406   CREATININE 0.43* 04/10/2014 0406   CALCIUM 8.2* 04/10/2014 0406   GFRNONAA >90 04/10/2014 0406   GFRAA >90 04/10/2014 0406    INR    Component Value Date/Time   INR 1.26 04/04/2014 0625     Intake/Output Summary (Last 24 hours) at 04/10/14 [id number] Last data filed at 04/10/14 0437  Gross per 24 hour  Intake   1084 ml  Output   1300 ml  Net   -216 ml     Assessment:  79 y.o. female is s/p:  TVAR 6 Days Post-Op  Plan: -pt doing well this am -palpable DP pulses bilateral -she did walk around the circle x 1 yesterday; is doing bed exercises also -eating a little more today-continue to push nutrition -last day of Rocephin (10/10) -DVT prophylaxis:  Lovenox   Doreatha MassedSamantha Rhyne, PA-C [facility] [phone] 04/10/2014 8:22 AM    Agree with the above INcrease mobilization  Wells Brabham

## 2014-04-11 LAB — GLUCOSE, CAPILLARY
GLUCOSE-CAPILLARY: 123 mg/dL — AB (ref 70–99)
GLUCOSE-CAPILLARY: 106 mg/dL — AB (ref 70–99)
Glucose-Capillary: 115 mg/dL — ABNORMAL HIGH (ref 70–99)
Glucose-Capillary: 113 mg/dL — ABNORMAL HIGH (ref 70–99)

## 2014-04-11 NOTE — Progress Notes (Signed)
Medicare Important Message given? YES  (If response is "NO", the following Medicare IM given date fields will be blank)  Date Medicare IM given: 04/11/14 Medicare IM given by:  Zenda AlpersWebster [employer]i

## 2014-04-11 NOTE — Progress Notes (Signed)
Patient walked 150 feet during the shift. Was on 2 L oxygen tank. Tolerated the walk and rested a couple of times during the walk. Will continue to monitor.

## 2014-04-11 NOTE — Progress Notes (Signed)
Nurse page me that patient was working with PT and BP was 84/48. This is the first episode of hypotension. She is asymptomatic. She is currently on Labetalol 200 mg bid and Norvasc 5 daily. Parameters have been given for both. We may need to make adjustments to BP medications if hypotension persists. Will give Albumin if BP still low in one hour.

## 2014-04-11 NOTE — Progress Notes (Signed)
Physical Therapy Treatment Patient Details Name: Stephanie Collier MRN: [id number] DOB: 07-13-1935 Today's Date: 04/11/2014    History of Present Illness 79 y.o. female s/p THORACIC AORTIC ENDOVASCULAR STENT GRAFT.    PT Comments    Patient more motivated to work with therapy today ambulating up to 105 feet, however required several rest breaks to complete this distance. SpO2 down to 88% on 2L supplemental O2 but returned to 93% and greater after sitting. Notably low BP (see vitals below). Denies feeling dizzy or lightheaded but pt did demonstrates some sway while standing. Daughter present during session and is very supportive. Pt tolerated exercises well and states she has been performing consistently over the past couple of days despite being unable to work with therapy. Patient will continue to benefit from skilled physical therapy services to further improve independence with functional mobility.   Follow Up Recommendations  Home health PT;Supervision/Assistance - 24 hour     Equipment Recommendations  Rolling walker with 5" wheels    Recommendations for Other Services       Precautions / Restrictions Precautions Precautions: Fall Precaution Comments: monitor O2 and BP Restrictions Weight Bearing Restrictions: No    Mobility  Bed Mobility               General bed mobility comments: In chair at start of therapy  Transfers Overall transfer level: Needs assistance Equipment used: Rolling walker (2 wheeled) Transfers: Sit to/from Stand Sit to Stand: Supervision         General transfer comment: Supervision for safety from low chair with arm rests. Slow to rise but did not require physical assist. Needed VC for hand placement.  Ambulation/Gait Ambulation/Gait assistance: Min guard Ambulation Distance (Feet): 105 Feet Assistive device: Rolling walker (2 wheeled) Gait Pattern/deviations: Step-through pattern;Decreased stride length;Trendelenburg;Decreased stance time -  left;Trunk flexed;Narrow base of support Gait velocity: decreased   General Gait Details: Educated on safe DMe use with a rolling walker. pt required several standing rest breaks to complete distance. No loss of balance while holding RW but mild sway noted when standing still. VC for upright posture. SpO2 dropped to 88% on 2L supplemental O2 but rose to 93% and greater after sitting.   Stairs            Wheelchair Mobility    Modified Rankin (Stroke Patients Only)       Balance                                    Cognition Arousal/Alertness: Lethargic Behavior During Therapy: WFL for tasks assessed/performed Overall Cognitive Status: Within Functional Limits for tasks assessed                      Exercises General Exercises - Lower Extremity Ankle Circles/Pumps: AROM;Both;10 reps;Seated Gluteal Sets: Strengthening;Both;10 reps;Seated Long Arc Quad: Strengthening;Both;10 reps;Seated (need rest at #5) Hip Flexion/Marching: Strengthening;Both;10 reps;Seated (rest needed at #5)    General Comments        Pertinent Vitals/Pain Pain Assessment: No/denies pain   Seated: BP 89/32, HR 69 Standing: BP 76/39, HR 70 Seated after ambulating: BP 84/48  Denied any symptoms of dizziness or lightheadedness throughout session.    Home Living                      Prior Function            PT Goals (  current goals can now be found in the care plan section) Acute Rehab PT Goals Patient Stated Goal: none stated PT Goal Formulation: With patient Time For Goal Achievement: 04/23/14 Potential to Achieve Goals: Good Progress towards PT goals: Progressing toward goals    Frequency  Min 3X/week    PT Plan Current plan remains appropriate    Co-evaluation             End of Session Equipment Utilized During Treatment: Gait belt;Oxygen Activity Tolerance: Patient limited by fatigue Patient left: with call bell/phone within reach;in  chair;with family/visitor present     Time: 6578-4696 PT Time Calculation (min) (ACUTE ONLY): 34 min  Charges:  $Gait Training: 8-22 mins $Therapeutic Exercise: 8-22 mins                    G Codes:      Berton Mount 04-29-2014, 4:25 PM Sunday Spillers [place], Myrtle [phone]

## 2014-04-11 NOTE — Progress Notes (Signed)
BP 84/48 per PT. Pt denies being lightheaded or dizzy, but feels weak. States she did not walk as well with PT as she did this morning with me. Doree Fudgeonielle Zimmerman, PA notified of low BP. Instructed to recheck in one hour and report if pt becomes symptomatic.

## 2014-04-11 NOTE — Progress Notes (Addendum)
  Progress Note    04/11/2014 9:49 AM 7 Days Post-Op  Subjective:  Wants to go home.  States Every time she falls asleep, someone wakes her up.  Afebrile VSS  Filed Vitals:   04/11/14 0436  BP: 113/47  Pulse: 67  Temp: 98 F (36.7 C)  Resp: 18    Physical Exam: Cardiac:  regular   Lungs:  Non labored Extremities:  2+ bilateral radial and DP palpable pulses   CBC    Component Value Date/Time   WBC 8.3 04/08/2014 0257   RBC 2.82* 04/08/2014 0257   HGB 8.2* 04/08/2014 0257   HCT 26.1* 04/08/2014 0257   PLT 277 04/08/2014 0257   MCV 92.6 04/08/2014 0257   MCH 29.1 04/08/2014 0257   MCHC 31.4 04/08/2014 0257   RDW 14.1 04/08/2014 0257   LYMPHSABS 1.4 04/01/2014 0430   MONOABS 1.0 04/01/2014 0430   EOSABS 0.0 04/01/2014 0430   BASOSABS 0.0 04/01/2014 0430    BMET    Component Value Date/Time   NA 138 04/10/2014 0406   K 4.0 04/10/2014 0406   CL 101 04/10/2014 0406   CO2 31 04/10/2014 0406   GLUCOSE 116* 04/10/2014 0406   BUN 5* 04/10/2014 0406   CREATININE 0.43* 04/10/2014 0406   CALCIUM 8.2* 04/10/2014 0406   GFRNONAA >90 04/10/2014 0406   GFRAA >90 04/10/2014 0406    INR    Component Value Date/Time   INR 1.26 04/04/2014 0625     Intake/Output Summary (Last 24 hours) at 04/11/14 0949 Last data filed at 04/10/14 2350  Gross per 24 hour  Intake    595 ml  Output    250 ml  Net    345 ml     Assessment:  79 y.o. female is s/p:  TVAR  7 Days Post-Op  Plan: -pt with diarrhea yesterday-C diff neg.  States she just got over diarrhea before admission to the hospital and does not want any more stool softeners.  Discontinued dulcolax (left Senna prn order) -pt needs to mobilize more and OOB to chair at meal times, which I stressed to her are very important as these must be done to prove she is safe to go home. -course of Rocephin is complete -DVT prophylaxis:  Lovenox -continue to encourage nutrition    Doreatha MassedSamantha Rhyne, PA-C [facility] [phone] 04/11/2014 9:49 AM  Overall deconditioned Diarrhea yesterday improved today Says appetite is improving No hematoma Continue to mobilize  Fabienne Brunsharles Fields, MD [facility] [place] Office: [phone] Pager: [phone]

## 2014-04-11 NOTE — Progress Notes (Signed)
Pt is refusing Ensure as she thinks this caused her loose stools. She does not like Boost fruit as she says it is too sweet. Pt encouraged to try and eat her meals.

## 2014-04-11 NOTE — Progress Notes (Addendum)
      [address]       [place] [id number]             [phone]        7 Days Post-Op Procedure(s) (LRB): THORACIC AORTIC ENDOVASCULAR STENT GRAFT (N/A) CHEST TUBE INSERTION (Left)  Subjective: Patient states appetite is improving. She is agreeable to participate in PT today.  Objective: Vital signs in last 24 hours: Temp:  [97.9 F (36.6 C)-98 F (36.7 C)] 98 F (36.7 C) (03/31 0436) Pulse Rate:  [67-74] 67 (03/31 0436) Cardiac Rhythm:  [-] Normal sinus rhythm (03/30 2015) Resp:  [18-22] 18 (03/31 0436) BP: (109-118)/(47-55) 113/47 mmHg (03/31 0436) SpO2:  [94 %-98 %] 94 % (03/31 0436)  Current Weight  04/10/14 131 lb 13.4 oz (59.8 kg)       Intake/Output from previous day: 03/30 0701 - 03/31 0700 In: 835 [P.O.:720; I.V.:115] Out: 250 [Urine:250]   Physical Exam:  Cardiovascular: RRR Pulmonary: Diminished at bases L>R bilaterally; no rales, wheezes, or rhonchi. Abdomen: Soft, non tender, bowel sounds present. Extremities: No lower extremity edema. Palpable 2+ DP bilaterally Wound: Clean and dry.  No erythema or signs of infection.  Lab Results: CBC:No results for input(s): WBC, HGB, HCT, PLT in the last 72 hours. BMET:  Recent Labs  04/09/14 0854 04/10/14 0406  NA 136 138  K 2.8* 4.0  CL 96 101  CO2 30 31  GLUCOSE 124* 116*  BUN <5* 5*  CREATININE 0.47* 0.43*  CALCIUM 8.1* 8.2*    PT/INR:  Lab Results  Component Value Date   INR 1.26 04/04/2014   INR 1.02 08/10/2012   ABG:  INR: Will add last result for INR, ABG once components are confirmed Will add last 4 CBG results once components are confirmed  Assessment/Plan:  1. CV - SR in the 60's. BP well controlled on Norvasc 5 mg daily and Labetalol 200 mg bid. 2.  Pulmonary - On 2 liters of oxygen via [place]. Try to wean as tolerates, but may need oxygen at discharge. Check CXR in am 3.  GI-encourage po intake, continue Ensure 4. ID-completed 10 days of Rocephin yesterday  for possible PNA/bronchits  5. Deconditioned-ambulate tid. Patient refused PT yesterday. Encouraged her and stress importance of walking daily 6. Patient, when ready for discharge, would like to go home with daughter;however, she may need SNF if does not become more mobile  ZIMMERMAN,DONIELLE MPA-C 04/11/2014,6:42 AM

## 2014-04-11 NOTE — Progress Notes (Signed)
Pt ambulated with walker and oxygen once around PTCU circle. She stopped 4 times for standing rest break to recover her breathing. Pt educated on importance of ambulation and she agrees to ambulate in hall 3 times  Per day.

## 2014-04-11 NOTE — Discharge Summary (Signed)
[address]       [place] [address]             [phone]           Discharge Summary  Name: Stephanie Collier DOB: December 21, 1935 79 y.o. 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 Osteoporosis   . Scoliosis   . Raynaud's disease        Procedures: 1. PLACEMENT OF RETRIEVABLE COOK CELECT IVC FILTER - 04/02/2014  Ultrasound guided access of right femoral artery   Inferior venacavogram  2. ENDOVASCULAR REPAIR OF DESCENDING AORTIC PENETRATING ULCER - 04/04/2014  Ultrasound guided access right femoral artery  Distal extension x 1  Catheter in the thoracic aorta x 2  Aortic arch angiogram  Thoracic aortogram    HPI:  The patient is a 79 y.o. female who had been in her usual state of health until approximately 3 weeks ago, when she developed fever, body aches, cough, diarrhea, and pain in her left back.  She was seen in the ER on 03/18/2014 and diagnosed with a viral illness.  She continued to cough and complain of shortness of breath, pleuritic chest pain and back pain, and returned to the ER on 03/30/2014.  At that time, she stated her back pain radiated to her jaws bilaterally.  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 Dr. Laneta Simmers was consulted for thoracic surgical evaluation and reviewed her scan while he was in the OR.  He agreed that it was most  likely a type B dissection and non-operative treatment was indicated. The patient was not hypertensive. She was subsequently admitted by CCM for further evaluation and treatment.     Hospital Course:  The patient was admitted to [facility] on 03/30/2014.  She was noted on urinalysis to have a UTI and was started empirically on antibiotics for the UTI and presumed bronchitis.  She continued to have back pain as well as pleuritic chest pain with deep inspiration and poor appetite. No diarrhea. She had a CTA with contrast on 3/20 which showed a penetrating aortic ulcer in the mid-descending thoracic aorta with associated aortic intramural hematoma extending from the left subclavian to the upper abdominal aorta. There was no hemorrhage into the mediastinum or pleural space. There was also a nonocclusive PE seen in the superior segment of the RLL and within the RLL segmental and subsegmental branches. Dr. Laneta Simmers saw the patient and reviewed her films and felt that this ulcer was likely the source of her pain, and would best be treated with endovascular stent grafting.  In regard to her acute/subacute PE, it was felt that she could not be anticoagulated due to her aorta, and a venous duplex was recommended. This revealed DVT in the right popliteal and peroneal veins with thrombus in the popliteal vein unattached to the vessel walls as well as occlusive thrombus in the peroneal vein.  Dr. Myra Gianotti from vascular surgery saw the  patient in consultation and an IVC filter was placed on 04/02/2014.   The patient continued to recover from her acute bronchitis and UTI and antibiotics were discontinued. It was felt that she could proceed at this time with stent graft repair of her thoracic aorta at this time. All risks, benefits and alternatives of surgery were explained in detail, and the patient agreed to proceed.  The patient was taken to the operating room and underwent the above procedure.    Early in the postoperative  course, the patient was hypertensive, requiring IV medications for BP control.  She also was treated with empiric antibiotics and aggressive pulmonary toilet measures for presumed pneumonia. She remained in the ICU for monitoring and her lumbar drain and chest tube were removed in the standard fashion. She was weak and deconditioned, and physical therapy was consulted to assist with ambulation.  Her condition overall did improve and she was transferred to the stepdown unit on postop day 4.    The patient has continued to slowly progress.  She has now completed a 10 day course of antibiotics for pneumonia.  Her pulmonary status is improving, although she remains on 2 liters of supplemental oxygen.  This is being weaned as tolerated.  Her appetite is still marginal, but she is receiving nutritional supplements.  She continues to work with PT on reconditioning. Blood pressures have been fairly well controlled on po medications.  Incisions are healing well.  We anticipate discharge home in the next 24-48 hours if she remains medically stable.     Recent vital signs:  Filed Vitals:   04/13/14 0625  BP: 121/61  Pulse: 73  Temp: 97.9 F (36.6 C)  Resp: 19    Recent laboratory studies:  CBC:  Recent Labs  04/12/14 0442 04/13/14 0841  WBC 7.2  --   HGB 7.7* 10.9*  HCT 24.5* 32.9*  PLT 351  --    BMET: No results for input(s): NA, K, CL, CO2, GLUCOSE, BUN, CREATININE, CALCIUM in the last 72 hours.  PT/INR: No results for input(s): LABPROT, INR in the last 72 hours.   Discharge Medications:     Medication List    TAKE these medications        acetaminophen 325 MG tablet  Commonly known as:  TYLENOL  Take 650 mg by mouth every 6 (six) hours as needed.     amLODipine 5 MG tablet  Commonly known as:  NORVASC  Take 1 tablet (5 mg total) by mouth daily.     guaiFENesin 600 MG 12 hr tablet  Commonly known as:  MUCINEX  Take 1 tablet (600 mg total) by mouth 2 (two) times daily as  needed.     labetalol 100 MG tablet  Commonly known as:  NORMODYNE  Take 1 tablet (100 mg total) by mouth 2 (two) times daily.     traMADol 50 MG tablet  Commonly known as:  ULTRAM  Take 1 tablet (50 mg total) by mouth every 6 (six) hours as needed for moderate pain.         Discharge Instructions:  The patient is to refrain from driving, heavy lifting or strenuous activity.  May shower daily and clean incisions with soap and water.  May resume regular diet.   Follow Up:    Follow-up Information    Follow up with Myra GianottiBRABHAM IV, Lala LundV. WELLS, MD In 4 weeks.   Specialty:  Vascular Surgery   Why:  Our office will call you to arrange  an appointment (sent)   Contact information:   [address] [place] [address] [phone]        Lowella Dandy 04/13/2014, 1:35 PM

## 2014-04-12 ENCOUNTER — Other Ambulatory Visit: Payer: Self-pay | Admitting: *Deleted

## 2014-04-12 ENCOUNTER — Inpatient Hospital Stay (HOSPITAL_COMMUNITY): Payer: Medicare Other

## 2014-04-12 DIAGNOSIS — I716 Thoracoabdominal aortic aneurysm, without rupture, unspecified: Secondary | ICD-10-CM

## 2014-04-12 DIAGNOSIS — Z95828 Presence of other vascular implants and grafts: Secondary | ICD-10-CM

## 2014-04-12 LAB — CBC
HCT: 24.5 % — ABNORMAL LOW (ref 36.0–46.0)
HEMOGLOBIN: 7.7 g/dL — AB (ref 12.0–15.0)
MCH: 29.2 pg (ref 26.0–34.0)
MCHC: 31.4 g/dL (ref 30.0–36.0)
MCV: 92.8 fL (ref 78.0–100.0)
Platelets: 351 10*3/uL (ref 150–400)
RBC: 2.64 MIL/uL — AB (ref 3.87–5.11)
RDW: 14.8 % (ref 11.5–15.5)
WBC: 7.2 10*3/uL (ref 4.0–10.5)

## 2014-04-12 LAB — GLUCOSE, CAPILLARY
GLUCOSE-CAPILLARY: 88 mg/dL (ref 70–99)
Glucose-Capillary: 113 mg/dL — ABNORMAL HIGH (ref 70–99)
Glucose-Capillary: 124 mg/dL — ABNORMAL HIGH (ref 70–99)

## 2014-04-12 LAB — PREPARE RBC (CROSSMATCH)

## 2014-04-12 MED ORDER — LABETALOL HCL 100 MG PO TABS
100.0000 mg | ORAL_TABLET | Freq: Two times a day (BID) | ORAL | Status: DC
  Administered 2014-04-12 – 2014-04-13 (×3): 100 mg via ORAL
  Filled 2014-04-12 (×4): qty 1

## 2014-04-12 MED ORDER — SODIUM CHLORIDE 0.9 % IV SOLN
Freq: Once | INTRAVENOUS | Status: AC
  Administered 2014-04-12: 16:00:00 via INTRAVENOUS

## 2014-04-12 NOTE — Progress Notes (Signed)
Utilization review completed.  

## 2014-04-12 NOTE — Progress Notes (Signed)
Pt with rhonchi in lungs and congested cough. I have been encouraging C/DB, IS and flutter valve. She coughs mucous into throat but has not been able to expectorate. I have educated her on the importance of pulmonary interventions and clearing the mucous from her lungs. Pt complains of low back pain which is limiting her mobility, but has been refusing any pain medication. I finally got her to agree to take some tylenol so that she may feel better about moving more.

## 2014-04-12 NOTE — Progress Notes (Signed)
Physical Therapy Treatment Patient Details Name: Stephanie Collier MRN: [id number] DOB: 12/20/1935 Today'Collier Date: 04/12/2014    History of Present Illness 79 y.o. female Collier/p THORACIC AORTIC ENDOVASCULAR STENT GRAFT.    PT Comments    Patient completed stair training today but is more fatigued, limiting ability to ambulate further distances. More coughing noted and she sounds congested but is unable to clear secreations. Educated on use of IS and flutter valve. Required 3L supplemental O2 to maintain SpO2 of >92% and has increased dyspnea today, 3/4 upon exertion. Patient will continue to benefit from skilled physical therapy services to further improve independence with functional mobility.    Follow Up Recommendations  Home health PT;Supervision/Assistance - 24 hour     Equipment Recommendations  Rolling walker with 5" wheels    Recommendations for Other Services       Precautions / Restrictions Precautions Precautions: Fall Precaution Comments: monitor O2 and BP Restrictions Weight Bearing Restrictions: No    Mobility  Bed Mobility Overal bed mobility: Modified Independent                Transfers Overall transfer level: Needs assistance Equipment used: Rolling walker (2 wheeled);1 person hand held assist Transfers: Sit to/from Stand Sit to Stand: Supervision;Min assist         General transfer comment: Supervision for safety from recliner, Min assist for hand held support without RW performed from bed. Slow to rise. Mild sway but improves once holding RW.  Ambulation/Gait Ambulation/Gait assistance: Min guard Ambulation Distance (Feet): 20 Feet (10 feet from bed to chair) Assistive device: Rolling walker (2 wheeled) Gait Pattern/deviations: Step-through pattern;Decreased stride length;Trendelenburg;Decreased stance time - left;Narrow base of support;Trunk flexed Gait velocity: decreased   General Gait Details: Ambulated short distance from bed to chair and chair  to stairs. Very fatigued with increased WOB. Did not wish to ambulate further distance today. Cues for upright posture and did not require any physical assist.   Stairs Stairs: Yes Stairs assistance: Min guard Stair Management: One rail Left;Step to pattern;Sideways Number of Stairs: 4 General stair comments: Educated on safe stair navigation technique with sideways approach holding onto rail with 2 hands. Safely completed without further questions. States she feels confident with this task.  Wheelchair Mobility    Modified Rankin (Stroke Patients Only)       Balance                                    Cognition Arousal/Alertness: Lethargic Behavior During Therapy: WFL for tasks assessed/performed Overall Cognitive Status: Within Functional Limits for tasks assessed                      Exercises      General Comments General comments (skin integrity, edema, etc.): Pt sounds more congested today, frequent couging. 3L supplemental O2 required to maintain SpO2 of 93% and greater while working with PT. BP 132/48 after ambulating and pt reported seeing "squiggly lines," but no blurred vision, dizziness or other symptoms reported.      Pertinent Vitals/Pain Pain Assessment: No/denies pain    Home Living                      Prior Function            PT Goals (current goals can now be found in the care plan section) Acute Rehab PT Goals PT  Goal Formulation: With patient Time For Goal Achievement: 04/23/14 Potential to Achieve Goals: Good Progress towards PT goals: Progressing toward goals    Frequency  Min 3X/week    PT Plan Current plan remains appropriate    Co-evaluation             End of Session Equipment Utilized During Treatment: Gait belt;Oxygen Activity Tolerance: Patient limited by fatigue Patient left: with call bell/phone within reach;in chair     Time: 0926-0959 PT Time Calculation (min) (ACUTE ONLY): 33  min  Charges:  $Gait Training: 8-22 mins $Therapeutic Activity: 8-22 mins                    G Codes:      Stephanie MountBarbour, Stephanie Collier 04/12/2014, 11:18 AM Stephanie Collier Stephanie Collier Stephanie Collier, PT [phone]

## 2014-04-12 NOTE — Progress Notes (Addendum)
      [address]       [place] [id number]             [phone]      8 Days Post-Op Procedure(s) (LRB): THORACIC AORTIC ENDOVASCULAR STENT GRAFT (N/A) CHEST TUBE INSERTION (Left)   Subjective:  Ms. Stephanie HaiLee states she is feeling better.  She ambulated with assistance yesterday, but developed hypotension during walks.   Her diarrhea has improved.  She is eating, better but still in small quantities.  Objective: Vital signs in last 24 hours: Temp:  [97.8 F (36.6 C)-98.1 F (36.7 C)] 98.1 F (36.7 C) (04/01 0345) Pulse Rate:  [63-79] 79 (04/01 0345) Cardiac Rhythm:  [-] Normal sinus rhythm (03/31 2030) Resp:  [18] 18 (04/01 0345) BP: (84-118)/(38-51) 113/46 mmHg (04/01 0345) SpO2:  [94 %-96 %] 96 % (04/01 0345)  Intake/Output from previous day: 03/31 0701 - 04/01 0700 In: 120 [P.O.:120] Out: 400 [Urine:400]  General appearance: alert, cooperative and no distress Heart: regular rate and rhythm Lungs: coarse throughout, clears with cough Abdomen: soft, non-tender; bowel sounds normal; no masses,  no organomegaly Extremities: extremities normal, atraumatic, no cyanosis or edema Wound: clean and dryu  Lab Results:  Recent Labs  04/12/14 0442  WBC 7.2  HGB 7.7*  HCT 24.5*  PLT 351   BMET:  Recent Labs  04/09/14 0854 04/10/14 0406  NA 136 138  K 2.8* 4.0  CL 96 101  CO2 30 31  GLUCOSE 124* 116*  BUN <5* 5*  CREATININE 0.47* 0.43*  CALCIUM 8.1* 8.2*    PT/INR: No results for input(s): LABPROT, INR in the last 72 hours. ABG No results found for: PHART, HCO3, TCO2, ACIDBASEDEF, O2SAT CBG (last 3)   Recent Labs  04/11/14 1640 04/11/14 2128 04/12/14 0607  GLUCAP 106* 113* 113*    Assessment/Plan: S/P Procedure(s) (LRB): THORACIC AORTIC ENDOVASCULAR STENT GRAFT (N/A) CHEST TUBE INSERTION (Left)  1. CV- NSR, developed hypotension while ambulating- will decrease Labetalol (parameters placed for administration) and discontinue  Norvasc 2. Pulm- continued chest congestion with cough, desats with ambulation, continue IS, patient refusing Mucinex 3. Renal- creatinine has been WNL, weight remains 10lbs above baseline 4. Acute Post operative anemia- Hgb is at 7.7, may benefit from transfusion with desaturations and hypotension 5. Deconditioning- patient needs to ambulate 6. Dispo- patient feeling better, appetite improved, developed hypotension with ambulation and desaturates Hgb 7.7 may benefit from transfusion, will discuss with staff   LOS: 13 days    BARRETT, ERIN 04/12/2014   I have seen and examined the patient and agree with the assessment and plan as outlined.  Probably would benefit from transfusion  Purcell NailsClarence H Tashema Tiller 04/12/2014 8:36 PM

## 2014-04-12 NOTE — Progress Notes (Addendum)
  Progress Note    04/12/2014 7:44 AM 8 Days Post-Op  Subjective:  Feeling better; diarrhea has improved.  Afebrile HR 60's-70's NSR 90's-110's systolic 96% 2LO2NC  Filed Vitals:   04/12/14 0345  BP: 113/46  Pulse: 79  Temp: 98.1 F (36.7 C)  Resp: 18    Physical Exam: Cardiac:  normal Lungs:  Coarse BS throughout Extremities:  2+ palpable DP pulses bilaterally   CBC    Component Value Date/Time   WBC 7.2 04/12/2014 0442   RBC 2.64* 04/12/2014 0442   HGB 7.7* 04/12/2014 0442   HCT 24.5* 04/12/2014 0442   PLT 351 04/12/2014 0442   MCV 92.8 04/12/2014 0442   MCH 29.2 04/12/2014 0442   MCHC 31.4 04/12/2014 0442   RDW 14.8 04/12/2014 0442   LYMPHSABS 1.4 04/01/2014 0430   MONOABS 1.0 04/01/2014 0430   EOSABS 0.0 04/01/2014 0430   BASOSABS 0.0 04/01/2014 0430    BMET    Component Value Date/Time   NA 138 04/10/2014 0406   K 4.0 04/10/2014 0406   CL 101 04/10/2014 0406   CO2 31 04/10/2014 0406   GLUCOSE 116* 04/10/2014 0406   BUN 5* 04/10/2014 0406   CREATININE 0.43* 04/10/2014 0406   CALCIUM 8.2* 04/10/2014 0406   GFRNONAA >90 04/10/2014 0406   GFRAA >90 04/10/2014 0406    INR    Component Value Date/Time   INR 1.26 04/04/2014 0625     Intake/Output Summary (Last 24 hours) at 04/12/14 0744 Last data filed at 04/12/14 0630  Gross per 24 hour  Intake    120 ml  Output    400 ml  Net   -280 ml     Assessment:  79 y.o. female is s/p:  TVAR  8 Days Post-Op  Plan: -pt mobilizing better, however, she still has rhonchi and desaturating to 88% while walking -DVT prophylaxis:  Lovenox -continue to encourage nutrition -anemic with hgb 7.7 down from 8.2-transfuse per CT surgery Continue increasing mobilization -Completed course of Rocephin for PNA   Doreatha MassedSamantha Rhyne, PA-C [facility] [phone] 04/12/2014 7:44 AM    Slow improvement. Dr Myra GianottiBrabham to see again Monday if pt not discharged.  Fabienne Brunsharles Darris Carachure, MD [facility] [place] Office: [phone] Pager: [phone]

## 2014-04-13 LAB — HEMOGLOBIN AND HEMATOCRIT, BLOOD
HCT: 32.9 % — ABNORMAL LOW (ref 36.0–46.0)
HEMOGLOBIN: 10.9 g/dL — AB (ref 12.0–15.0)

## 2014-04-13 LAB — TYPE AND SCREEN
ABO/RH(D): O POS
ANTIBODY SCREEN: NEGATIVE
UNIT DIVISION: 0
Unit division: 0

## 2014-04-13 LAB — GLUCOSE, CAPILLARY
GLUCOSE-CAPILLARY: 96 mg/dL (ref 70–99)
Glucose-Capillary: 125 mg/dL — ABNORMAL HIGH (ref 70–99)

## 2014-04-13 MED ORDER — AMLODIPINE BESYLATE 5 MG PO TABS: 5.0000 mg | ORAL_TABLET | Freq: Every day | ORAL | Status: DC

## 2014-04-13 MED ORDER — LABETALOL HCL 100 MG PO TABS: 100.0000 mg | ORAL_TABLET | Freq: Two times a day (BID) | ORAL | Status: DC

## 2014-04-13 MED ORDER — GUAIFENESIN ER 600 MG PO TB12
600.0000 mg | ORAL_TABLET | Freq: Two times a day (BID) | ORAL | Status: DC | PRN
Start: 2014-04-13 — End: 2014-05-15

## 2014-04-13 MED ORDER — TRAMADOL HCL 50 MG PO TABS: 50.0000 mg | ORAL_TABLET | Freq: Four times a day (QID) | ORAL | Status: DC | PRN

## 2014-04-13 NOTE — Progress Notes (Addendum)
      [address]       [place] [id number]             [phone]      9 Days Post-Op Procedure(s) (LRB): THORACIC AORTIC ENDOVASCULAR STENT GRAFT (N/A) CHEST TUBE INSERTION (Left)   Subjective:  Ms. Nedra HaiLee states she feels better after transfusion yesterday.  She states she was up coughing all night.  However, she states she always has a cough and difficulty expectorating sputum which is not new for her.  She is ready to go home.  Objective: Vital signs in last 24 hours: Temp:  [97.4 F (36.3 C)-98.9 F (37.2 C)] 97.9 F (36.6 C) (04/02 0625) Pulse Rate:  [65-74] 73 (04/02 0625) Cardiac Rhythm:  [-] Normal sinus rhythm (04/01 2000) Resp:  [16-19] 19 (04/02 0625) BP: (104-132)/(45-61) 121/61 mmHg (04/02 0625) SpO2:  [95 %-100 %] 96 % (04/02 0625)  Intake/Output from previous day: 04/01 0701 - 04/02 0700 In: 1361 [P.O.:680; Blood:681] Out: 250 [Urine:250]  General appearance: alert, cooperative and no distress Heart: regular rate and rhythm Lungs: clear to auscultation bilaterally Abdomen: soft, non-tender; bowel sounds normal; no masses,  no organomegaly Extremities: edema trace Wound: clean and dry  Lab Results:  Recent Labs  04/12/14 0442  WBC 7.2  HGB 7.7*  HCT 24.5*  PLT 351   BMET: No results for input(s): NA, K, CL, CO2, GLUCOSE, BUN, CREATININE, CALCIUM in the last 72 hours.  PT/INR: No results for input(s): LABPROT, INR in the last 72 hours. ABG No results found for: PHART, HCO3, TCO2, ACIDBASEDEF, O2SAT CBG (last 3)   Recent Labs  04/12/14 1123 04/12/14 1659 04/13/14 0620  GLUCAP 124* 88 96    Assessment/Plan: S/P Procedure(s) (LRB): THORACIC AORTIC ENDOVASCULAR STENT GRAFT (N/A) CHEST TUBE INSERTION (Left)  1. CV- NSR, Hypotension improved, tolerated yesterdays dose of low dose Labetalol 2. Pulm- remains on oxygen this morning, lungs are clear, will have nurse obtain O2 sats and document need for possible home oxygen,  encouraged use of IS routinely at discharge 3. ID- previous HCAP, Rocephin course completed, no further indication for ABX 4. Acute post operative anemia- H/H 7.7, received 2 units yesterday, H/H not placed by RN after transfusion, order placed this morning 5. Dispo- patient is medically stable, hypotension is improved, will have nurse document Oxygen levels if needed will arrange home O2, otherwise plan to d/c home this afternoon   LOS: 14 days    BARRETT, ERIN 04/13/2014  I have seen and examined the patient and agree with the assessment and plan as outlined.  Purcell NailsClarence H Owen 04/13/2014 11:59 AM

## 2014-04-13 NOTE — Progress Notes (Signed)
SATURATION QUALIFICATIONS: (This note is used to comply with regulatory documentation for home oxygen)  Patient Saturations on Room Air at Rest = 96%  Patient Saturations on Room Air while Ambulating = 90-99%  Patient Saturations on 2 Liters of oxygen while Ambulating = 100%  Please briefly explain why patient needs home oxygen:Patient ambulated with walker and no oxygen 100 ft with no dizziness.  Patient had some shortness of breath but was resolved with rest.

## 2014-04-18 DIAGNOSIS — Z48812 Encounter for surgical aftercare following surgery on the circulatory system: Secondary | ICD-10-CM | POA: Diagnosis not present

## 2014-05-06 ENCOUNTER — Ambulatory Visit
Admission: RE | Admit: 2014-05-06 | Discharge: 2014-05-06 | Disposition: A | Discharge status: Home / Self Care | Payer: Medicare Other | Source: Ambulatory Visit | Attending: Surgery | Admitting: Surgery

## 2014-05-06 DIAGNOSIS — Z95828 Presence of other vascular implants and grafts: Secondary | ICD-10-CM

## 2014-05-06 DIAGNOSIS — I716 Thoracoabdominal aortic aneurysm, without rupture, unspecified: Secondary | ICD-10-CM

## 2014-05-06 MED ORDER — IOPAMIDOL (ISOVUE-370) INJECTION 76%
75.0000 mL | Freq: Once | INTRAVENOUS | Status: AC | PRN
  Administered 2014-05-06: 75 mL via INTRAVENOUS

## 2014-05-10 ENCOUNTER — Encounter: Payer: Self-pay | Admitting: Surgery

## 2014-05-13 ENCOUNTER — Ambulatory Visit (INDEPENDENT_AMBULATORY_CARE_PROVIDER_SITE_OTHER): Payer: Self-pay | Admitting: Surgery

## 2014-05-13 ENCOUNTER — Other Ambulatory Visit: Payer: Self-pay | Admitting: *Deleted

## 2014-05-13 ENCOUNTER — Encounter: Payer: Self-pay | Admitting: Surgery

## 2014-05-13 VITALS — BP 122/51 | HR 64 | Wt 117.6 lb

## 2014-05-13 DIAGNOSIS — I7123 Aneurysm of the descending thoracic aorta, without rupture: Secondary | ICD-10-CM

## 2014-05-13 DIAGNOSIS — I712 Thoracic aortic aneurysm, without rupture, unspecified: Secondary | ICD-10-CM

## 2014-05-13 NOTE — Progress Notes (Signed)
Patient name: Stephanie CarboJanice Collier MRN: [id number] DOB: 1935/02/13 Sex: female     Chief Complaint  Patient presents with  . Routine Post Op    s/p TEVAR 4 wk f/u w/ CTA abd/pelvis prior    HISTORY OF PRESENT ILLNESS: The patient is back for follow-up.  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  Prior to her operation she suffered a viral illness which kept her in bed for 2 weeks.  She was found to have a DVT as well as PE.  She is not a candidate for anticoagulation given her intramural hematoma, therefore a IVC filter was placed prior to her operation.  She is here today for follow-up.  She continues to complain of back pain which was present prior to her initial presentation and is chronic.  Past Medical History  Diagnosis Date  . Osteoporosis   . Scoliosis   . Raynaud's disease     Past Surgical History  Procedure Laterality Date  . Appendectomy      at age 79  . Insertion of vena cava filter N/A 04/02/2014    Procedure: INSERTION OF VENA CAVA FILTER;  Surgeon: Nada LibmanVance W Deniah Saia, MD;  Location: [facility] CATH LAB;  Service: Cardiovascular;  Laterality: N/A;  . Thoracic aortic endovascular stent graft N/A 04/04/2014    Procedure: THORACIC AORTIC ENDOVASCULAR STENT GRAFT;  Surgeon: Nada LibmanVance W Lynard Postlewait, MD;  [facility];  Service: Vascular;  Laterality: N/A;  . Chest tube insertion Left 04/04/2014    Procedure: CHEST TUBE INSERTION;  Surgeon: Nada LibmanVance W Yajayra Feldt, MD;  [facility];  Service: Vascular;  Laterality: Left;  by Dr. Collier BullockBarttle    History   Social History  . Marital Status: Significant Other    Spouse Name: N/A  . Number of Children: N/A  . Years of Education: N/A   Occupational History  . Not on file.   Social History Main Topics  . Smoking status: Never Smoker   . Smokeless tobacco: Not on file  . Alcohol Use: No    . Drug Use: No  . Sexual Activity: Not on file   Other Topics Concern  . Not on file   Social History Narrative    History reviewed. No pertinent family history.  Allergies as of 05/13/2014  . (No Known Allergies)    Current Outpatient Prescriptions on File Prior to Visit  Medication Sig Dispense Refill  . acetaminophen (TYLENOL) 325 MG tablet Take 650 mg by mouth every 6 (six) hours as needed.    Marland Kitchen. amLODipine (NORVASC) 5 MG tablet Take 1 tablet (5 mg total) by mouth daily. (Patient taking differently: Take 2.5 mg by mouth daily. ) 30 tablet 3  . guaiFENesin (MUCINEX) 600 MG 12 hr tablet Take 1 tablet (600 mg total) by mouth 2 (two) times daily as needed.    . labetalol (NORMODYNE) 100 MG tablet Take 1 tablet (100 mg total) by mouth 2 (two) times daily. 60 tablet 3  . traMADol (ULTRAM) 50 MG tablet Take 1 tablet (50 mg total) by mouth every 6 (six) hours as needed for moderate pain. 30 tablet 0   No current facility-administered medications on file prior to visit.      PHYSICAL EXAMINATION:   Vital signs are  Filed Vitals:   05/13/14 1433  BP: 122/51  Pulse: 64  Weight: 117 lb 9.6 oz (  53.343 kg)  SpO2: 100%   Body mass index is 21.5 kg/(m^2). General: The patient appears their stated age. HEENT:  No gross abnormalities Pulmonary:  Non labored breathing Abdomen: Soft and non-tender Musculoskeletal: There are no major deformities. Neurologic: No focal weakness or paresthesias are detected, Skin: There are no ulcer or rashes noted. Psychiatric: The patient has normal affect. Cardiovascular: regular rate and rhythm.  I removed a chest tube suture.  Palpable femoral pulses   Diagnostic Studies CT angiogram shows a new focal dissection just distal to the stent.  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 Plan: I discussed the dissection within her aorta with the patient.  I would not recommend any treatment right now but rather monitoring.  If this area increases in size, I would consider placing a distal extension.  The patient will also need continued monitoring for her infrarenal abdominal aortic aneurysm as well as her iliac aneurysm.  The indications for repair were discussed with the patient.I have scheduled her for CT angiogram of the chest abdomen and pelvis in 6 months with follow-up with me.  The patient continues to have a IVC filter in place.  This was inserted preoperatively because she could not be anticoagulated.  I think that her DVT/PE is secondary to her being bedridden from a viral illness which was associated with dehydration.  I have recommended removing her filter in mid June, assuming there is no clot within the filter.  I would also place her on anticoagulation for proximally 2 months once her filter has been removed.  Jorge Ny, M.D. [facility] [place] Office: [phone] Pager:  [phone]

## 2014-05-14 ENCOUNTER — Other Ambulatory Visit: Payer: Self-pay | Admitting: Surgery

## 2014-05-14 DIAGNOSIS — I712 Thoracic aortic aneurysm, without rupture, unspecified: Secondary | ICD-10-CM

## 2014-05-15 ENCOUNTER — Ambulatory Visit
Admission: RE | Admit: 2014-05-15 | Discharge: 2014-05-15 | Disposition: A | Discharge status: Home / Self Care | Payer: Medicare Other | Source: Ambulatory Visit | Attending: Surgery | Admitting: Surgery

## 2014-05-15 ENCOUNTER — Ambulatory Visit (INDEPENDENT_AMBULATORY_CARE_PROVIDER_SITE_OTHER): Payer: Self-pay | Admitting: Surgery

## 2014-05-15 ENCOUNTER — Encounter: Payer: Self-pay | Admitting: Surgery

## 2014-05-15 VITALS — BP 89/56 | HR 64 | Resp 20 | Ht 62.0 in | Wt 117.0 lb

## 2014-05-15 DIAGNOSIS — I712 Thoracic aortic aneurysm, without rupture, unspecified: Secondary | ICD-10-CM

## 2014-05-15 NOTE — Progress Notes (Signed)
HPI:  Patient returns for routine postoperative follow-up having undergone TEVAR for a descending aortic aneurysm with acute intramural hematoma extending from the subclavian artery down to the visceral vessels with subsequent penetrating ulcer formaiton on 04/04/2014. She had an IVC filter placed 2 days preop for a right leg DVT with PE. The patient's early postoperative recovery while in the hospital was notable for an uncomplicated postop course. Since hospital discharge the patient reports that she has continued to have some back pain similar to her chronic back pain that she thinks is due to her scoliosis. She has been having her BP checked in physical therapy and her systolic BP has been 102 to 130 max. She has felt fatigued at times but feels that she is recovering.    Current Outpatient Prescriptions  Medication Sig Dispense Refill  . amLODipine (NORVASC) 5 MG tablet Take 1 tablet (5 mg total) by mouth daily. (Patient taking differently: Take 2.5 mg by mouth daily. ) 30 tablet 3  . labetalol (NORMODYNE) 100 MG tablet Take 1 tablet (100 mg total) by mouth 2 (two) times daily. 60 tablet 3   No current facility-administered medications for this visit.    Physical Exam: BP 89/56 mmHg  Pulse 64  Resp 20  Ht 5\' 2"  (1.575 m)  Wt 117 lb (53.071 kg)  BMI 21.39 kg/m2  SpO2 93% She looks well Cardiac exam shows a regular rate and rhythm with normal heart sounds Lungs are clear There is no peripheral edema  Diagnostic Tests:  CLINICAL DATA: Endovascular stent graft repair of thoracoabdominal aortic aneurysm.  EXAM: CT ANGIOGRAPHY CHEST, ABDOMEN AND PELVIS  TECHNIQUE: Multidetector CT imaging through the chest, abdomen and pelvis was performed using the standard protocol during bolus administration of intravenous contrast. Multiplanar reconstructed images and MIPs were obtained and reviewed to evaluate the vascular anatomy.  CONTRAST: 75 mL of Isovue 370  intravenously.  COMPARISON: CT scan of March 31, 2014.  FINDINGS: CTA CHEST FINDINGS  No pneumothorax or pleural effusion is noted. Minimal subsegmental atelectasis is noted posteriorly in both lung bases. No mediastinal mass or adenopathy is noted. Status post stent graft placement beginning distal to the origin of left subclavian artery, and extending to distal descending thoracic aorta. Great vessels are widely patent without significant stenosis.  There is noted the development of disruption of the wall of the distal portion of the descending thoracic aorta just distal to the distal extent of the stent graft. This abnormality measures 20 x 30 mm and is concerning for new focal dissection.  Review of the MIP images confirms the above findings.  CTA ABDOMEN AND PELVIS FINDINGS  The liver, spleen and pancreas appear normal. IVC filter is noted in infrarenal position. Adrenal glands and kidneys appear normal. No hydronephrosis or renal obstruction is noted. There is no evidence of bowel obstruction. No abnormal fluid collection is noted. Urinary bladder appears normal. Uterus and ovaries appear normal. No significant adenopathy is noted.  Mesenteric arteries are widely patent without significant stenosis. Calcified plaque is noted at the origin of both renal arteries suggesting moderate to severe stenosis bilaterally. Atherosclerotic calcifications of abdominal aorta are noted without dissection. Infrarenal abdominal aortic aneurysm is noted 3.0 cm in maximum measured AP diameter. Left common iliac artery is aneurysmal measuring 2.5 cm in diameter. Right common iliac artery is mildly dilated at 1.9 cm.  Review of the MIP images confirms the above findings.  IMPRESSION: Status post stent graft placement in transverse aortic arch  and descending thoracic aorta for treatment of aortic ulcer in hemorrhage noted on prior exam. Since prior exam, there is the interval  development of focal dissection just distal to the distal portion of the stent graft in the descending thoracic aorta. This measures 3 x 2 cm.  3.0 cm infrarenal abdominal aortic aneurysm is noted. 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 Recommend followup by ultrasound in 3 years. This recommendation follows ACR consensus guidelines: White Paper of the ACR Incidental Findings Committee II on Vascular Findings. J Am Coll Radiol 2013; 10:789-794.  These results will be called to the ordering clinician or representative by the Radiologist Assistant, and communication documented in the PACS or zVision Dashboard.   Electronically Signed  By: Lupita RaiderJames Green Jr, M.D.  On: 05/06/2014 15:28   Impression:  She is doing well overall following endovascular repair of her descending aortic aneurysm and intramural hematoma with penetrating ulcer. She does have a focal area of dissection just distal to the distal end of the stent which is new. We did not extend the stent graft further at the time of her surgery due to the concern about paraplegia. I agree with following this for now with a repeat CTA in 6 months. Her BP is running on the low side of normal and was only around 90 today. She felt dizzy while we were reviewing the CT and had to lie down. A follow-up BP was 120/55. I told her to discontinue the Norvasc 2.5 mg daily and continue the Labetalol for now.   Plan:  She is going to have her IVC filter removed by Dr. Myra GianottiBrabham in June. She is going to return to see him in six months with a CTA of the chest, abdomen and pelvis. I will discuss the CT with him at that time and decide if extension of her thoracic stent graft is needed.   Alleen BorneBryan K Bartle, MD [facility] [phone]

## 2014-05-16 ENCOUNTER — Other Ambulatory Visit: Payer: Self-pay

## 2014-06-26 ENCOUNTER — Ambulatory Visit (HOSPITAL_COMMUNITY)
Admission: RE | Admit: 2014-06-26 | Discharge: 2014-06-26 | Disposition: A | Discharge status: Home / Self Care | Payer: Medicare Other | Source: Ambulatory Visit | Attending: Surgery | Admitting: Surgery

## 2014-06-26 ENCOUNTER — Encounter (HOSPITAL_COMMUNITY)
Admission: RE | Disposition: A | Discharge status: Home / Self Care | Payer: Medicare Other | Source: Ambulatory Visit | Attending: Surgery

## 2014-06-26 DIAGNOSIS — I714 Abdominal aortic aneurysm, without rupture: Secondary | ICD-10-CM | POA: Diagnosis not present

## 2014-06-26 DIAGNOSIS — Z86711 Personal history of pulmonary embolism: Secondary | ICD-10-CM | POA: Diagnosis not present

## 2014-06-26 DIAGNOSIS — I723 Aneurysm of iliac artery: Secondary | ICD-10-CM | POA: Diagnosis not present

## 2014-06-26 DIAGNOSIS — I7300 Raynaud's syndrome without gangrene: Secondary | ICD-10-CM | POA: Insufficient documentation

## 2014-06-26 DIAGNOSIS — M810 Age-related osteoporosis without current pathological fracture: Secondary | ICD-10-CM | POA: Insufficient documentation

## 2014-06-26 DIAGNOSIS — M419 Scoliosis, unspecified: Secondary | ICD-10-CM | POA: Diagnosis not present

## 2014-06-26 DIAGNOSIS — Z4689 Encounter for fitting and adjustment of other specified devices: Secondary | ICD-10-CM | POA: Insufficient documentation

## 2014-06-26 DIAGNOSIS — I712 Thoracic aortic aneurysm, without rupture: Secondary | ICD-10-CM | POA: Diagnosis not present

## 2014-06-26 DIAGNOSIS — Z86718 Personal history of other venous thrombosis and embolism: Secondary | ICD-10-CM | POA: Insufficient documentation

## 2014-06-26 HISTORY — PX: PERIPHERAL VASCULAR CATHETERIZATION: SHX172C

## 2014-06-26 LAB — POCT I-STAT, CHEM 8
BUN: 17 mg/dL (ref 6–20)
Calcium, Ion: 1.14 mmol/L (ref 1.13–1.30)
Chloride: 104 mmol/L (ref 101–111)
Creatinine, Ser: 0.7 mg/dL (ref 0.44–1.00)
Glucose, Bld: 96 mg/dL (ref 65–99)
HCT: 40 % (ref 36.0–46.0)
HEMOGLOBIN: 13.6 g/dL (ref 12.0–15.0)
Potassium: 3.8 mmol/L (ref 3.5–5.1)
Sodium: 139 mmol/L (ref 135–145)
TCO2: 21 mmol/L (ref 0–100)

## 2014-06-26 LAB — POCT ACTIVATED CLOTTING TIME
ACTIVATED CLOTTING TIME: 196 s
Activated Clotting Time: 171 seconds

## 2014-06-26 SURGERY — IVC FILTER REMOVAL
Anesthesia: LOCAL

## 2014-06-26 MED ORDER — ACETAMINOPHEN 325 MG RE SUPP: 325.0000 mg | RECTAL | Status: DC | PRN

## 2014-06-26 MED ORDER — ALUM & MAG HYDROXIDE-SIMETH 200-200-20 MG/5ML PO SUSP: 15.0000 mL | ORAL | Status: DC | PRN

## 2014-06-26 MED ORDER — MORPHINE SULFATE 10 MG/ML IJ SOLN
INTRAMUSCULAR | Status: AC
  Filled 2014-06-26: qty 1

## 2014-06-26 MED ORDER — HEPARIN SODIUM (PORCINE) 1000 UNIT/ML IJ SOLN
INTRAMUSCULAR | Status: AC
  Filled 2014-06-26: qty 1

## 2014-06-26 MED ORDER — MIDAZOLAM HCL 2 MG/2ML IJ SOLN
INTRAMUSCULAR | Status: AC
  Filled 2014-06-26: qty 2

## 2014-06-26 MED ORDER — MIDAZOLAM HCL 2 MG/2ML IJ SOLN
INTRAMUSCULAR | Status: DC | PRN
  Administered 2014-06-26 (×2): 1 mg via INTRAVENOUS

## 2014-06-26 MED ORDER — OXYCODONE HCL 5 MG PO TABS: 5.0000 mg | ORAL_TABLET | ORAL | Status: DC | PRN

## 2014-06-26 MED ORDER — ACETAMINOPHEN 325 MG PO TABS: 325.0000 mg | ORAL_TABLET | ORAL | Status: DC | PRN

## 2014-06-26 MED ORDER — SODIUM CHLORIDE 0.9 % IV SOLN
INTRAVENOUS | Status: DC
  Administered 2014-06-26: 07:00:00 via INTRAVENOUS

## 2014-06-26 MED ORDER — ONDANSETRON HCL 4 MG/2ML IJ SOLN: 4.0000 mg | Freq: Four times a day (QID) | INTRAMUSCULAR | Status: DC | PRN

## 2014-06-26 MED ORDER — DOCUSATE SODIUM 100 MG PO CAPS: 100.0000 mg | ORAL_CAPSULE | Freq: Every day | ORAL | Status: DC

## 2014-06-26 MED ORDER — PHENOL 1.4 % MT LIQD: 1.0000 | OROMUCOSAL | Status: DC | PRN

## 2014-06-26 MED ORDER — IODIXANOL 320 MG/ML IV SOLN
INTRAVENOUS | Status: DC | PRN
  Administered 2014-06-26: 35 mL via INTRAVENOUS

## 2014-06-26 MED ORDER — MORPHINE SULFATE 2 MG/ML IJ SOLN
INTRAMUSCULAR | Status: AC
  Filled 2014-06-26: qty 1

## 2014-06-26 MED ORDER — RIVAROXABAN (XARELTO) VTE STARTER PACK (15 & 20 MG): ORAL_TABLET | ORAL | Status: DC

## 2014-06-26 MED ORDER — GUAIFENESIN-DM 100-10 MG/5ML PO SYRP: 15.0000 mL | ORAL_SOLUTION | ORAL | Status: DC | PRN

## 2014-06-26 MED ORDER — MORPHINE SULFATE 10 MG/ML IJ SOLN
INTRAMUSCULAR | Status: DC | PRN
  Administered 2014-06-26 (×2): 2 mg via INTRAVENOUS
  Administered 2014-06-26: 2 mg

## 2014-06-26 MED ORDER — LABETALOL HCL 5 MG/ML IV SOLN: 10.0000 mg | INTRAVENOUS | Status: DC | PRN

## 2014-06-26 MED ORDER — MORPHINE SULFATE 10 MG/ML IJ SOLN: 2.0000 mg | INTRAMUSCULAR | Status: DC | PRN

## 2014-06-26 MED ORDER — HEPARIN (PORCINE) IN NACL 2-0.9 UNIT/ML-% IJ SOLN
INTRAMUSCULAR | Status: AC
  Filled 2014-06-26: qty 1000

## 2014-06-26 MED ORDER — LIDOCAINE HCL (PF) 1 % IJ SOLN
INTRAMUSCULAR | Status: AC
  Filled 2014-06-26: qty 30

## 2014-06-26 MED ORDER — ONDANSETRON HCL 4 MG/2ML IJ SOLN
INTRAMUSCULAR | Status: AC
  Filled 2014-06-26: qty 2

## 2014-06-26 MED ORDER — HYDRALAZINE HCL 20 MG/ML IJ SOLN: 5.0000 mg | INTRAMUSCULAR | Status: DC | PRN

## 2014-06-26 MED ORDER — HEPARIN SODIUM (PORCINE) 1000 UNIT/ML IJ SOLN
INTRAMUSCULAR | Status: DC | PRN
  Administered 2014-06-26: 5000 [IU] via INTRAVENOUS

## 2014-06-26 MED ORDER — SODIUM CHLORIDE 0.9 % IV SOLN: 1.0000 mL/kg/h | INTRAVENOUS | Status: DC

## 2014-06-26 MED ORDER — METOPROLOL TARTRATE 1 MG/ML IV SOLN: 2.0000 mg | INTRAVENOUS | Status: DC | PRN

## 2014-06-26 SURGICAL SUPPLY — 21 items
BALLN POWERFLX PRO 10X40X80 (BALLOONS) ×2 IMPLANT
CATH ANGIO 5F BER2 65CM (CATHETERS) ×2 IMPLANT
CATH OMNI FLUSH 5F 65CM (CATHETERS) ×2 IMPLANT
COVER PRB 48X5XTLSCP FOLD TPE (BAG) ×1 IMPLANT
COVER PROBE 5X48 (BAG) ×1
DEVICE CONTINUOUS FLUSH (MISCELLANEOUS) ×2 IMPLANT
DEVICE ENSNARE  12MMX20MM (VASCULAR PRODUCTS) ×1
DEVICE ENSNARE 12MMX20MM (VASCULAR PRODUCTS) ×1 IMPLANT
DEVICE ONE SNARE 15MM (VASCULAR PRODUCTS) ×2 IMPLANT
DEVICE TORQUE H2O (MISCELLANEOUS) ×2 IMPLANT
DRAPE ZERO GRAVITY STERILE (DRAPES) ×2 IMPLANT
KIT PV (KITS) ×2 IMPLANT
SHEATH PINNACLE ST 7F 45CM (SHEATH) ×2 IMPLANT
SHIELD RADPAD SCOOP 12X17 (MISCELLANEOUS) ×2 IMPLANT
STOPCOCK MORSE 400PSI 3WAY (MISCELLANEOUS) ×2 IMPLANT
SYR MEDRAD MARK V 150ML (SYRINGE) ×2 IMPLANT
TRAY PV CATH (CUSTOM PROCEDURE TRAY) ×2 IMPLANT
TUBING HIGH PRESSURE 120CM (CONNECTOR) ×2 IMPLANT
WIRE AMPLATZ SS-J .035X180CM (WIRE) ×2 IMPLANT
WIRE BENTSON .035X145CM (WIRE) ×2 IMPLANT
WIRE HI TORQ VERSACORE J 260CM (WIRE) ×2 IMPLANT

## 2014-06-26 NOTE — Progress Notes (Signed)
Act 171 - preparing to remove sheath

## 2014-06-26 NOTE — Progress Notes (Signed)
Unable to draw blood from sheath in R neck, normal saline infusing without difficulty at 10 cc/hr through Neck access. Pt resting with eyes closed, resp even and unlabored . Rates pain 6/10 currently. Monitoring.

## 2014-06-26 NOTE — Op Note (Signed)
    Patient name: Stephanie Collier MRN: [id number] DOB: [birth year] Sex: female  06/26/2014 Pre-operative Diagnosis: IVC filter Post-operative diagnosis:  Same Surgeon:  Durene Cal Procedure Performed:  1.  Ultrasound-guided access, right internal jugular vein  2.  Inferior venacavogram  3.  Catheter into inferior vena cava 2  4.  Removal of inferior vena cava filter   Assistant:  Lonia Skinner   Indications:  The patient had an IVC filter placed perioperatively.  She comes back in for removal  Procedure:  The patient was identified in the holding area and taken to room 8.  The patient was then placed supine on the table and prepped and draped in the usual sterile fashion.  A time out was called.  Ultrasound was used to evaluate the right internal jugular vein.  It was widely patent and easily compressible.  1% lidocaine was used for local anesthesia.  An 11 blade was used to make a skin nick.  The right internal jugular vein was cannulated under ultrasound guidance with a micropuncture needle.  A wire was advanced without resistance.  I micropuncture sheath.  A Benson wire was then advanced into the inferior vena cava followed by a Omni flush catheter.  A inferior venacavogram was performed  Findings:   Venacavogram:  The inferior vena cava is widely patent with no evidence of thrombus within the vena cava.    Intervention:  At this point over a Benson wire, a 7 French sheath was advanced into the inferior vena cava just above the filter.  I tried a goose neck snare and a EnSnare to try to grab the tip of the filter.  The tip of the filter appeared to be embedded into the wall of the vena cava.  I used a 10 x 40 balloon to try to lift the tip of the filter off of the wall of the vena cava which was unsuccessful.  I then over a Amplatz superstiff wire inserted a 14 French sheath and I did give the patient 5000 units of heparin.  I hooked the base of the filter with a rim catheter and snared the  wire through a separate accessed through the sheath.  With this maneuver I was able to get the hook of the filter graft and pull on it to remove it from the wall.  I then tried to snare the hook of the filter.  Dr. Deanne Coffer assisted with this part I was able to snare the hook of the filter and retrieve it.  The filter was removed.  It was intact.  There were no immediate complications    Impression:  #1  complicated but successful remover of inferior vena cava filter     V. Durene Cal, M.D. [facility] [place] Office: [phone] Pager:  [phone]

## 2014-06-26 NOTE — Progress Notes (Signed)
Pt states she is feeling much better and ready to go home

## 2014-06-26 NOTE — H&P (Signed)
Patient name: Stephanie Collier: [id number] DOB: [birth year]Sex: female    Chief Complaint  Patient presents with  . Routine Post Op    s/p TEVAR 4 wk f/u w/ CTA abd/pelvis prior    HISTORY OF PRESENT ILLNESS: The patient is back for follow-up. 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 Prior to her operation she suffered a viral illness which kept her in bed for 2 weeks. She was found to have a DVT as well as PE. She is not a candidate for anticoagulation given her intramural hematoma, therefore a IVC filter was placed prior to her operation. She is here today for follow-up. She continues to complain of back pain which was present prior to her initial presentation and is chronic.  Past Medical History  Diagnosis Date  . Osteoporosis   . Scoliosis   . Raynaud's disease     Past Surgical History  Procedure Laterality Date  . Appendectomy      at age 61  . Insertion of vena cava filter N/A 04/02/2014    Procedure: INSERTION OF VENA CAVA FILTER; Surgeon: Nada Libman, MD; Location: [facility] CATH [facility]; Service: Cardiovascular; Laterality: N/A;  . Thoracic aortic endovascular stent graft N/A 04/04/2014    Procedure: THORACIC AORTIC ENDOVASCULAR STENT GRAFT; Surgeon: Nada Libman, MD; Location: [facility] OR; Service: Vascular; Laterality: N/A;  . Chest tube insertion Left 04/04/2014    Procedure: CHEST TUBE INSERTION; Surgeon: Nada Libman, MD; Location: [facility] OR; Service: Vascular; Laterality: Left; by Dr. Collier Bullock    History   Social History  . Marital Status: Significant Other    Spouse Name: N/A  . Number of Children: N/A  . Years of Education: N/A   Occupational History  . Not on file.   Social  History Main Topics  . Smoking status: Never Smoker   . Smokeless tobacco: Not on file  . Alcohol Use: No  . Drug Use: No  . Sexual Activity: Not on file   Other Topics Concern  . Not on file   Social History Narrative    History reviewed. No pertinent family history.  Allergies as of 05/13/2014  . (No Known Allergies)    Current Outpatient Prescriptions on File Prior to Visit  Medication Sig Dispense Refill  . acetaminophen (TYLENOL) 325 MG tablet Take 650 mg by mouth every 6 (six) hours as needed.    Marland Kitchen amLODipine (NORVASC) 5 MG tablet Take 1 tablet (5 mg total) by mouth daily. (Patient taking differently: Take 2.5 mg by mouth daily. ) 30 tablet 3  . guaiFENesin (MUCINEX) 600 MG 12 hr tablet Take 1 tablet (600 mg total) by mouth 2 (two) times daily as needed.    . labetalol (NORMODYNE) 100 MG tablet Take 1 tablet (100 mg total) by mouth 2 (two) times daily. 60 tablet 3  . traMADol (ULTRAM) 50 MG tablet Take 1 tablet (50 mg total) by mouth every 6 (six) hours as needed for moderate pain. 30 tablet 0   No current facility-administered medications on file prior to visit.      PHYSICAL EXAMINATION:  Vital signs are  Filed Vitals:   05/13/14 1433  BP: 122/51  Pulse: 64  Weight: 117 lb 9.6 oz (53.343 kg)  SpO2: 100%   Body mass index is 21.5 kg/(m^2). General: The patient appears their stated age. HEENT: No gross abnormalities Pulmonary:  Non labored breathing Abdomen: Soft and non-tender Musculoskeletal: There are no major deformities. Neurologic: No focal weakness or paresthesias are detected, Skin: There are no ulcer or rashes noted. Psychiatric: The patient has normal affect. Cardiovascular: regular rate and rhythm. I removed a chest tube suture. Palpable femoral pulses   Diagnostic Studies CT angiogram shows a new focal dissection just distal to the stent. 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 Plan: I discussed the dissection within her aorta with the patient. I would not recommend any treatment right now but rather monitoring. If this area increases in size, I would consider placing a distal extension.  The patient will also need continued monitoring for her infrarenal abdominal aortic aneurysm as well as her iliac aneurysm. The indications for repair were discussed with the patient.I have scheduled her for CT angiogram of the chest abdomen and pelvis in 6 months with follow-up with me.  The patient continues to have a IVC filter in place. This was inserted preoperatively because she could not be anticoagulated. I think that her DVT/PE is secondary to her being bedridden from a viral illness which was associated with dehydration. I have recommended removing her filter in mid June, assuming there is no clot within the filter. I would also place her on anticoagulation for proximally 2 months once her filter has been removed.  Jorge Ny, M.D. [facility] [place] Office: [phone] Pager: [phone]         No changes  Durene Cal

## 2014-06-26 NOTE — Discharge Instructions (Signed)
Incision Care °An incision is when a surgeon cuts into your body tissues. After surgery, the incision needs to be cared for properly to prevent infection.  °HOME CARE INSTRUCTIONS  °· Take all medicine as directed by your caregiver. Only take over-the-counter or prescription medicines for pain, discomfort, or fever as directed by your caregiver. °· Do not remove your bandage (dressing) or get your incision wet until your surgeon gives you permission. In the event that your dressing becomes wet, dirty, or starts to smell, change the dressing and call your surgeon for instructions as soon as possible. °· Take showers. Do not take tub baths, swim, or do anything that may soak the wound until it is healed. °· Resume your normal diet and activities as directed or allowed. °· Avoid lifting any weight until you are instructed otherwise. °· Use anti-itch antihistamine medicine as directed by your caregiver. The wound may itch when it is healing. Do not pick or scratch at the wound. °· Follow up with your caregiver for stitch (suture) or staple removal as directed. °· Drink enough fluids to keep your urine clear or pale yellow. °SEEK MEDICAL CARE IF:  °· You have redness, swelling, or increasing pain in the wound that is not controlled with medicine. °· You have drainage, blood, or pus coming from the wound that lasts longer than 1 day. °· You develop muscle aches, chills, or a general ill feeling. °· You notice a bad smell coming from the wound or dressing. °· Your wound edges separate after the sutures, staples, or skin adhesive strips have been removed. °· You develop persistent nausea or vomiting. °SEEK IMMEDIATE MEDICAL CARE IF:  °· You have a fever. °· You develop a rash. °· You develop dizzy episodes or faint while standing. °· You have difficulty breathing. °· You develop any reaction or side effects to medicine given. °MAKE SURE YOU:  °· Understand these instructions. °· Will watch your condition. °· Will get help  right away if you are not doing well or get worse. °Document Released: 07/17/2004 Document Revised: 03/22/2011 Document Reviewed: 02/21/2013 °ExitCare® Patient Information ©2015 [employer]. This information is not intended to replace advice given to you by your health care provider. Make sure you discuss any questions you have with your health care provider. ° ° °

## 2014-06-26 NOTE — Progress Notes (Signed)
Pt has been waiting for her daughter to pick her up.  Pt has been drinking large amounts of orange juice.  Upon standing, pt became nauseated and vomited what appeared to be OJ. After vomiting several more times over the next 30 minutes,   Pt given Zofran 4mg  IV. Dr. Arbie Cookey stated pt could still be discharged when she felt better and to call if she didn't feel better.

## 2014-06-26 NOTE — Progress Notes (Signed)
Site area: rt IJ venous sheath removed by RCox, pressure held by Sandrea Hammond Site Prior to Removal:  Level  0 Pressure Applied For:  20 minutes Manual:  yes  Patient Status During Pull:  stable Post Pull Site:  Level  0 Post Pull Instructions Given:  yes Post Pull Pulses Present:  NA Dressing Applied:  tegaderm Bedrest begins @   1245 Comments:

## 2014-06-27 ENCOUNTER — Telehealth: Payer: Self-pay

## 2014-06-27 ENCOUNTER — Encounter (HOSPITAL_COMMUNITY): Payer: Self-pay | Admitting: Surgery

## 2014-06-27 DIAGNOSIS — Z86718 Personal history of other venous thrombosis and embolism: Secondary | ICD-10-CM

## 2014-06-27 DIAGNOSIS — Z86711 Personal history of pulmonary embolism: Secondary | ICD-10-CM

## 2014-06-27 MED ORDER — WARFARIN SODIUM 5 MG PO TABS: ORAL_TABLET | ORAL | Status: DC

## 2014-06-27 MED FILL — Lidocaine HCl Local Preservative Free (PF) Inj 1%: INTRAMUSCULAR | Qty: 30 | Status: AC

## 2014-06-27 MED FILL — Heparin Sodium (Porcine) 2 Unit/ML in Sodium Chloride 0.9%: INTRAMUSCULAR | Qty: 1000 | Status: AC

## 2014-06-27 NOTE — Telephone Encounter (Signed)
Phone call from pt.  Reported she was given a prescription for Xarelto by Dr. Myra Gianotti following the removal of an IVC filter on 6/15.  Stated she cannot afford $600.00 expense for the medication, and has no prescription coverage.  Is requesting to have an alternative to the Xarelto prescribed.  Discussed with Dr. Myra Gianotti;  Recommended options of either Pradaxa or Coumadin, for a 1 month period only.  Dr. Myra Gianotti stated the Pradaxa may be offered through a drug assistance program.  Pt. Notified of options.  Stated she did not want to take Pradaxa, and said she would like to take the Coumadin.  Phone call placed to Dr. Dorothyann Peng office re: question of taking on the management of Coumadin.  Left message for Dr. Allyne Gee nurse to call back re: this issue. Pt. Made aware of plan to discuss with her PCP for Coumadin management.

## 2014-06-27 NOTE — Telephone Encounter (Signed)
Rec'd return phone call from [place], nurse for Dr. Allyne Gee.  Stated that Dr. Allyne Gee would manage the Coumadin; was advised to tell the pt. to schedule an appt. with Dr. Allyne Gee in 1 wk., from starting the coumadin.  Notified pt. of Dr. Estanislado Spire order to start Coumadin 5mg , 1 tab po. qd; have PT/INR checked in 1 wk, and Dr. Allyne Gee will manage the dosing at that time.  Pt. Verb. Understanding.

## 2014-07-18 ENCOUNTER — Telehealth: Payer: Self-pay | Admitting: *Deleted

## 2014-07-18 NOTE — Telephone Encounter (Signed)
Patient called in very confused about her medication especially her Labetalol and Coumadin. She states that her hair is coming out and she is very tired all the time. She was put on Coumadin by Dr. Myra GianottiBrabham (s/p TEVAR on 04-04-14 and also has history of DVT and PE) but is being followed by Dr. Allyne GeeSanders for her bloodwork and dosing. She is wanting to stop all her medications "because her hair is falling out". I stressed the importance of her contacting Dr. Allyne GeeSanders or Dr. Laneta SimmersBartle prior to doing this as it would be detrimental to her health. She wants to make an appt with Dr. Myra GianottiBrabham to discuss this but I again told her to also call Dr[facility]. She will make an appt to speak to Dr. Myra GianottiBrabham about this matter but will continue to take her Coumadin. Patient voiced understanding and agreement with this plan.

## 2014-07-22 ENCOUNTER — Telehealth: Payer: Self-pay | Admitting: *Deleted

## 2014-07-22 NOTE — Telephone Encounter (Signed)
Stephanie Collier has called wanting to know how much longer Stephanie Collier wants her to take Labetalol. She says she does not have high blood pressure. She has cut her dose down to only at bedtime due to making her feel worthless. Her readings have been normal lately...109/65, 126/74, 118/72, 132/61.  I told her I would consult with Stephanie Collier after he returns from vacation next week and she understood. I will give her a return call.

## 2014-08-26 ENCOUNTER — Telehealth: Payer: Self-pay | Admitting: *Deleted

## 2014-08-26 NOTE — Telephone Encounter (Signed)
Patient called to ask if she can stop her Coumadin since she has taken it for 2 months now; had IVC filter removed 06-26-14, hx of TEVAR 04-04-14 by Drs. Brabham and Bartle. Her Coumadin has been managed by Dr. Kevan Ny during this time. Per Dr. Myra Gianotti, it is OK to stop her Coumadin at this time. I left this message on the patient's phone [phone].

## 2014-09-17 ENCOUNTER — Ambulatory Visit: Payer: Medicare Other | Admitting: Cardiovascular Disease

## 2014-10-29 ENCOUNTER — Other Ambulatory Visit: Payer: Self-pay | Admitting: Surgery

## 2014-10-29 LAB — CREATININE, ISTAT: CREATININE, ISTAT: 0.8 mg/dL (ref 0.6–1.3)

## 2014-11-12 ENCOUNTER — Other Ambulatory Visit: Payer: Medicare Other

## 2014-11-13 ENCOUNTER — Ambulatory Visit
Admission: RE | Admit: 2014-11-13 | Discharge: 2014-11-13 | Disposition: A | Discharge status: Home / Self Care | Payer: Medicare Other | Source: Ambulatory Visit | Attending: Surgery | Admitting: Surgery

## 2014-11-13 DIAGNOSIS — I712 Thoracic aortic aneurysm, without rupture, unspecified: Secondary | ICD-10-CM

## 2014-11-13 MED ORDER — IOPAMIDOL (ISOVUE-370) INJECTION 76%
75.0000 mL | Freq: Once | INTRAVENOUS | Status: AC | PRN
  Administered 2014-11-13: 75 mL via INTRAVENOUS

## 2014-11-14 ENCOUNTER — Encounter: Payer: Self-pay | Admitting: Surgery

## 2014-11-18 ENCOUNTER — Encounter: Payer: Self-pay | Admitting: Surgery

## 2014-11-18 ENCOUNTER — Ambulatory Visit (INDEPENDENT_AMBULATORY_CARE_PROVIDER_SITE_OTHER): Payer: Medicare Other | Admitting: Surgery

## 2014-11-18 VITALS — BP 129/76 | HR 64 | Ht 62.0 in | Wt 121.7 lb

## 2014-11-18 DIAGNOSIS — I7123 Aneurysm of the descending thoracic aorta, without rupture: Secondary | ICD-10-CM

## 2014-11-18 DIAGNOSIS — I712 Thoracic aortic aneurysm, without rupture: Secondary | ICD-10-CM

## 2014-11-18 NOTE — Progress Notes (Signed)
Patient name: Stephanie Collier MRN: [id number] DOB: 08-21-1935 Sex: female     Chief Complaint  Patient presents with  . Re-evaluation    6 month f/u     HISTORY OF PRESENT ILLNESS: The patient is back for follow-up. 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 Prior to her operation she suffered a viral illness which kept her in bed for 2 weeks. She was found to have a DVT as well as PE. She is not a candidate for anticoagulation given her intramural hematoma, therefore a IVC filter was placed prior to her operation.her IVC filter was removed this summer.  She continues to complain of back pain which was present prior to her initial presentation.  Otherwise, she has had no acute interval change.  Past Medical History  Diagnosis Date  . Osteoporosis   . Scoliosis   . Raynaud's disease     Past Surgical History  Procedure Laterality Date  . Appendectomy      at age 49  . Insertion of vena cava filter N/A 04/02/2014    Procedure: INSERTION OF VENA CAVA FILTER;  Surgeon: Nada Libman, MD;  Location: [facility] CATH LAB;  Service: Cardiovascular;  Laterality: N/A;  . Thoracic aortic endovascular stent graft N/A 04/04/2014    Procedure: THORACIC AORTIC ENDOVASCULAR STENT GRAFT;  Surgeon: Nada Libman, MD;  Location: [facility] OR;  Service: Vascular;  Laterality: N/A;  . Chest tube insertion Left 04/04/2014    Procedure: CHEST TUBE INSERTION;  Surgeon: Nada Libman, MD;  Location: [facility] OR;  Service: Vascular;  Laterality: Left;  by Dr. Collier Bullock  . Peripheral vascular catheterization N/A 06/26/2014    Procedure: IVC Filter Removal;  Surgeon: Nada Libman, MD;  Location: [facility] INVASIVE CV LAB;  Service: Cardiovascular;  Laterality: N/A;    Social History   Social History  . Marital Status: Significant Other    Spouse Name: N/A  .  Number of Children: N/A  . Years of Education: N/A   Occupational History  . Not on file.   Social History Main Topics  . Smoking status: Never Smoker   . Smokeless tobacco: Not on file  . Alcohol Use: No  . Drug Use: No  . Sexual Activity: Not on file   Other Topics Concern  . Not on file   Social History Narrative    No family history on file.  Allergies as of 11/18/2014 - Review Complete 11/18/2014  Allergen Reaction Noted  . Fentanyl Shortness Of Breath 06/26/2014    Current Outpatient Prescriptions on File Prior to Visit  Medication Sig Dispense Refill  . amLODipine (NORVASC) 5 MG tablet Take 1 tablet (5 mg total) by mouth daily. (Patient not taking: Reported on 06/24/2014) 30 tablet 3  . labetalol (NORMODYNE) 100 MG tablet Take 1 tablet (100 mg total) by mouth 2 (two) times daily. (Patient not taking: Reported on 11/18/2014) 60 tablet 3  . warfarin (COUMADIN) 5 MG tablet Take 1 tablet (5 mg) daily.  Schedule appt. with PCP in 1 week for PT/INR check, to determine further dosing (Patient not taking: Reported on 11/18/2014) 30 tablet 0   No current facility-administered medications on file prior to visit.     REVIEW OF SYSTEMS: See history of present illness, otherwise negative PHYSICAL EXAMINATION:   Vital signs are  Filed Vitals:   11/18/14 1414 11/18/14  1418  BP: 145/77 129/76  Pulse: 64   Height: 5\' 2"  (1.575 m)   Weight: 121 lb 11.2 oz (55.203 kg)   SpO2: 100%    Body mass index is 22.25 kg/(m^2). General: The patient appears their stated age. HEENT:  No gross abnormalities Pulmonary:  Non labored breathing Musculoskeletal: There are no major deformities. Neurologic: No focal weakness or paresthesias are detected, Skin: There are no ulcer or rashes noted. Psychiatric: The patient has normal affect. Cardiovascular: There is a regular rate and rhythm without significant murmur appreciated.   Diagnostic Studies I have reviewed her CT scan with the  following findings: Thoracic aortic stent graft is stable in appearance. Great vessels in the thorax remain patent without evidence of dissection. The ascending aorta is non aneurysmal and without dissection.  Dissection with thrombosed false lumen in the distal thoracic aorta extending into the upper abdominal aorta is stable. This does not appear flow limiting. Maximal diameter is 3.2 cm which is stable.  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  There may be significant narrowing at the origin of the left renal artery  Aneurysmal dilatation of the distal abdominal aorta has increased from 3.0 cm to 3.2 cm.  Right and left common iliac artery aneurysms are stable with measurements of 1.9 cm and 2.5 cm respectively  Left internal iliac artery aneurysm is stable at 1.8 cm.  There are small right lung nodules measuring 4 mm or less which are stable dating back to April. If the patient is at high risk for bronchogenic carcinoma, follow-up chest CT at 6 months is recommended. If the patient is at low risk, no follow-up is needed.  Assessment: Status post endovascular repair of descending thoracic aortic dissection Plan: Stable CT scan. We will need follow-up of pulmonary nodules in a year We'll need follow-up of aortoiliac aneurysmal changes in one year Repeat CT scan of the chest abdomen and pelvis, 1 year  V. Charlena CrossWells Brabham IV, M.D. [facility] [place] Office: [phone] Pager:  [phone]

## 2014-11-18 NOTE — Addendum Note (Signed)
Addended by: Adria DillELDRIDGE-LEWIS, Emmamarie Kluender L on: 11/18/2014 05:41 PM   Modules accepted: Orders

## 2015-04-28 DIAGNOSIS — Z79899 Other long term (current) drug therapy: Secondary | ICD-10-CM | POA: Diagnosis not present

## 2015-04-28 DIAGNOSIS — M549 Dorsalgia, unspecified: Secondary | ICD-10-CM | POA: Diagnosis not present

## 2015-04-28 DIAGNOSIS — R0989 Other specified symptoms and signs involving the circulatory and respiratory systems: Secondary | ICD-10-CM | POA: Diagnosis not present

## 2015-04-28 DIAGNOSIS — I7090 Unspecified atherosclerosis: Secondary | ICD-10-CM | POA: Diagnosis not present

## 2015-04-28 DIAGNOSIS — Z0001 Encounter for general adult medical examination with abnormal findings: Secondary | ICD-10-CM | POA: Diagnosis not present

## 2015-04-28 DIAGNOSIS — I2699 Other pulmonary embolism without acute cor pulmonale: Secondary | ICD-10-CM | POA: Diagnosis not present

## 2015-04-28 DIAGNOSIS — I10 Essential (primary) hypertension: Secondary | ICD-10-CM | POA: Diagnosis not present

## 2015-04-28 DIAGNOSIS — I82409 Acute embolism and thrombosis of unspecified deep veins of unspecified lower extremity: Secondary | ICD-10-CM | POA: Diagnosis not present

## 2015-07-14 DIAGNOSIS — Z23 Encounter for immunization: Secondary | ICD-10-CM | POA: Diagnosis not present

## 2015-07-14 DIAGNOSIS — T148 Other injury of unspecified body region: Secondary | ICD-10-CM | POA: Diagnosis not present

## 2015-09-03 DIAGNOSIS — R350 Frequency of micturition: Secondary | ICD-10-CM | POA: Diagnosis not present

## 2015-11-10 DIAGNOSIS — Z23 Encounter for immunization: Secondary | ICD-10-CM | POA: Diagnosis not present

## 2015-11-17 ENCOUNTER — Ambulatory Visit
Admission: RE | Admit: 2015-11-17 | Discharge: 2015-11-17 | Disposition: A | Discharge status: Home / Self Care | Payer: Medicare Other | Source: Ambulatory Visit | Attending: Surgery | Admitting: Surgery

## 2015-11-17 ENCOUNTER — Other Ambulatory Visit: Payer: Medicare Other

## 2015-11-17 DIAGNOSIS — I712 Thoracic aortic aneurysm, without rupture: Secondary | ICD-10-CM

## 2015-11-17 DIAGNOSIS — J439 Emphysema, unspecified: Secondary | ICD-10-CM | POA: Diagnosis not present

## 2015-11-17 DIAGNOSIS — I7123 Aneurysm of the descending thoracic aorta, without rupture: Secondary | ICD-10-CM

## 2015-11-17 DIAGNOSIS — I700 Atherosclerosis of aorta: Secondary | ICD-10-CM | POA: Diagnosis not present

## 2015-11-17 MED ORDER — IOPAMIDOL (ISOVUE-370) INJECTION 76%
75.0000 mL | Freq: Once | INTRAVENOUS | Status: AC | PRN
  Administered 2015-11-17: 75 mL via INTRAVENOUS

## 2015-11-21 ENCOUNTER — Encounter: Payer: Self-pay | Admitting: Surgery

## 2015-11-24 ENCOUNTER — Ambulatory Visit: Payer: Medicare Other | Admitting: Surgery

## 2015-11-26 ENCOUNTER — Encounter: Payer: Self-pay | Admitting: Surgery

## 2015-11-26 ENCOUNTER — Ambulatory Visit (INDEPENDENT_AMBULATORY_CARE_PROVIDER_SITE_OTHER): Payer: Medicare Other | Admitting: Surgery

## 2015-11-26 VITALS — BP 129/60 | HR 58 | Temp 97.1°F | Resp 14 | Ht 61.0 in | Wt 124.0 lb

## 2015-11-26 DIAGNOSIS — I714 Abdominal aortic aneurysm, without rupture, unspecified: Secondary | ICD-10-CM

## 2015-11-26 NOTE — Progress Notes (Signed)
[facility] [place]  Patient name: Stephanie Collier MRN: [id number] DOB: April 16, 1935 Sex: female  REASON FOR VISIT: follow up  HPI: The patient is back for follow-up. 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 Prior to her operation she suffered a viral illness which kept her in bed for 2 weeks. She was found to have a DVT as well as PE. She is not a candidate for anticoagulation given her intramural hematoma, therefore a IVC filter was placed prior to her operation.her IVC filter was removed in 2016.  She continues to complain of back pain which she states is getting worse.  She does not take any medications for this and does not want to.  Past Medical History:  Diagnosis Date  . Osteoporosis   . Raynaud's disease   . Scoliosis     No family history on file.  SOCIAL HISTORY: Social History  Substance Use Topics  . Smoking status: Never Smoker  . Smokeless tobacco: Not on file  . Alcohol use No    Allergies  Allergen Reactions  . Fentanyl Shortness Of Breath    Current Outpatient Prescriptions  Medication Sig Dispense Refill  . labetalol (NORMODYNE) 100 MG tablet Take 1 tablet (100 mg total) by mouth 2 (two) times daily. 60 tablet 3  . amLODipine (NORVASC) 5 MG tablet Take 1 tablet (5 mg total) by mouth daily. (Patient not taking: Reported on 11/26/2015) 30 tablet 3  . warfarin (COUMADIN) 5 MG tablet Take 1 tablet (5 mg) daily.  Schedule appt. with PCP in 1 week for PT/INR check, to determine further dosing (Patient not taking: Reported on 11/26/2015) 30 tablet 0   No current facility-administered medications for this visit.     REVIEW OF SYSTEMS:  [X]  denotes positive finding, [ ]  denotes negative finding Cardiac  Comments:  Chest pain or chest  pressure:    Shortness of breath upon exertion:    Short of breath when lying flat:    Irregular heart rhythm:        Vascular    Pain in calf, thigh, or hip brought on by ambulation:    Pain in feet at night that wakes you up from your sleep:     Blood clot in your veins:    Leg swelling:         Pulmonary    Oxygen at home:    Productive cough:     Wheezing:         Neurologic    Sudden weakness in arms or legs:     Sudden numbness in arms or legs:     Sudden onset of difficulty speaking or slurred speech:    Temporary loss of vision in one eye:     Problems with dizziness:         Gastrointestinal    Blood in stool:     Vomited blood:         Genitourinary    Burning when urinating:     Blood in urine:        Psychiatric    Major depression:         Hematologic    Bleeding problems:    Problems with blood clotting too easily:        Skin    Rashes or ulcers:        Constitutional  Fever or chills:      PHYSICAL EXAM: Vitals:   11/26/15 1252 11/26/15 1257  BP: 140/66 129/60  Pulse: (!) 56 (!) 58  Resp: 14   Temp: 97.1 F (36.2 C)   TempSrc: Oral   SpO2: 99%   Weight: 124 lb (56.2 kg)   Height: 5\' 1"  (1.549 m)     GENERAL: The patient is a well-nourished female, in no acute distress. The vital signs are documented above. CARDIAC: There is a regular rate and rhythm.  PULMONARY: There is good air exchange bilaterally without wheezing or rales. MUSCULOSKELETAL: There are no major deformities or cyanosis. NEUROLOGIC: No focal weakness or paresthesias are detected. SKIN: There are no ulcers or rashes noted. PSYCHIATRIC: The patient has a normal affect.  DATA:  I have reviewed her CT angiogram of the chest abdomen and pelvis with the following findings: Re- demonstration of endovascular repair of thoracic penetrating ulcer/intramural hematoma with overlapping stent graft. Unchanged configuration of the repair, with no evidence of graft migration  or significant aortic degeneration. Angulation of the thoracic aorta just beyond the distal seal zone is unchanged from prior, with similar appearance of the chronic dissection flap. Within measurement error, the diameter is unchanged.  Aortic atherosclerosis, with similar appearance of infrarenal abdominal aortic aneurysm, which measures similar to the prior within measurement error, less than 5 cm.  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  Atherosclerotic disease of the mesenteric vessels and bilateral renal arteries. Degree of stenosis of the renal arteries difficult to assess, though appears worse on the left.  Cardiomegaly with coronary artery disease.  Emphysema.  Re- demonstration of small nodules measuring 3-4 mm of the right lung, with new 3 mm - 4 mm nodule of the right middle lobe. Etiology indeterminate, potentially inflammatory, with all of these nodules below the threshold for PET. Continued attention on further follow-up studies is warranted.  MEDICAL ISSUES: Status post TEVAR:  Her stent graft remains in good position.  The dissection flap has resolved.  We'll continue with follow-up surveillance in 2 years.  Back pain: I suspect that this is more arthritis and nature.  I have recommended evaluation by a spine doctor.  However, she states that she does not want surgery or medications, so I question whether or not this would be helpful  Aortoiliac aneurysmal degeneration: Maximum aortic diameter is 3.1 mm.  Iliac measurements are 2.1.  We will continue with surveillance within a study in 2 years.    Durene CalWells Adanna Zuckerman, MD [facility] Tel [phone] Pager [phone]) [phone]

## 2016-02-10 ENCOUNTER — Encounter: Payer: Self-pay | Admitting: Cardiology

## 2016-04-05 ENCOUNTER — Other Ambulatory Visit: Payer: Self-pay | Admitting: Internal Medicine

## 2016-04-05 ENCOUNTER — Ambulatory Visit
Admission: RE | Admit: 2016-04-05 | Discharge: 2016-04-05 | Disposition: A | Discharge status: Home / Self Care | Payer: Medicare Other | Source: Ambulatory Visit | Attending: Internal Medicine | Admitting: Internal Medicine

## 2016-04-05 DIAGNOSIS — R05 Cough: Secondary | ICD-10-CM

## 2016-04-05 DIAGNOSIS — R0989 Other specified symptoms and signs involving the circulatory and respiratory systems: Secondary | ICD-10-CM

## 2016-04-05 DIAGNOSIS — R059 Cough, unspecified: Secondary | ICD-10-CM

## 2016-04-05 DIAGNOSIS — R0689 Other abnormalities of breathing: Secondary | ICD-10-CM | POA: Diagnosis not present

## 2016-04-07 ENCOUNTER — Telehealth: Payer: Self-pay | Admitting: Surgery

## 2016-04-07 NOTE — Telephone Encounter (Signed)
Pt called wanting us to let Dr. Myra GianottiBrabham know that her PCP discontinued her Labetalol and added Amlodipine. 2.5mg  QD and Losartan 25mg  QD.  Sent MD a staff message.  Called pt to make her aware.  Ashleigh E., LPN.

## 2016-04-07 NOTE — Addendum Note (Signed)
Addended by: Yolonda KidaEVANS, ASHLEIGH N on: 04/07/2016 05:17 PM   Modules accepted: Orders

## 2016-04-13 NOTE — Telephone Encounter (Signed)
Per Dr. Myra Gianotti, "I think this is a good change".  I called patient and left voice msg. Asheigh E., LPN.

## 2016-04-16 NOTE — Telephone Encounter (Addendum)
Note opened in error.

## 2016-05-11 ENCOUNTER — Other Ambulatory Visit (HOSPITAL_COMMUNITY): Payer: Self-pay | Admitting: Respiratory Therapy

## 2016-05-11 DIAGNOSIS — I10 Essential (primary) hypertension: Secondary | ICD-10-CM | POA: Diagnosis not present

## 2016-05-11 DIAGNOSIS — R0989 Other specified symptoms and signs involving the circulatory and respiratory systems: Secondary | ICD-10-CM | POA: Diagnosis not present

## 2016-05-11 DIAGNOSIS — Z1389 Encounter for screening for other disorder: Secondary | ICD-10-CM | POA: Diagnosis not present

## 2016-05-11 DIAGNOSIS — Z0001 Encounter for general adult medical examination with abnormal findings: Secondary | ICD-10-CM | POA: Diagnosis not present

## 2016-05-11 DIAGNOSIS — J441 Chronic obstructive pulmonary disease with (acute) exacerbation: Secondary | ICD-10-CM

## 2016-05-16 IMAGING — CT CT ANGIO CHEST
2 of 9 series · 2 of 36 positions shown · IV contrast (Iohexol (Omnipaque 350))
Comparison: CT scan of the chest, abdomen and pelvis performed
without intravenous contrast 03/30/2014

CLINICAL DATA: 78-year-old female with back pain, fever and jaw
pain for the past several weeks. Possible aortic dissection.

EXAM:
CT ANGIOGRAPHY CHEST WITH CONTRAST
TECHNIQUE: Multidetector CT imaging of the chest was performed using the
standard protocol during bolus administration of intravenous
contrast. Multiplanar CT image reconstructions and MIPs were
obtained to evaluate the vascular anatomy.
CONTRAST:  100mL OMNIPAQUE IOHEXOL 350 MG/ML SOLN

[Series 300: locator · axial · 0.68mm/px · 1 of 1 slices shown]
[im 1/1  lung]
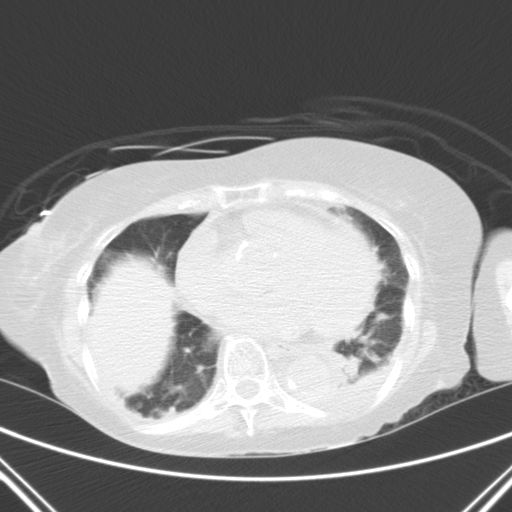

[Series 504: cor · coronal · 0.50mm/px · 1 of 89 slices shown]
[im 45/89  mediastinal]
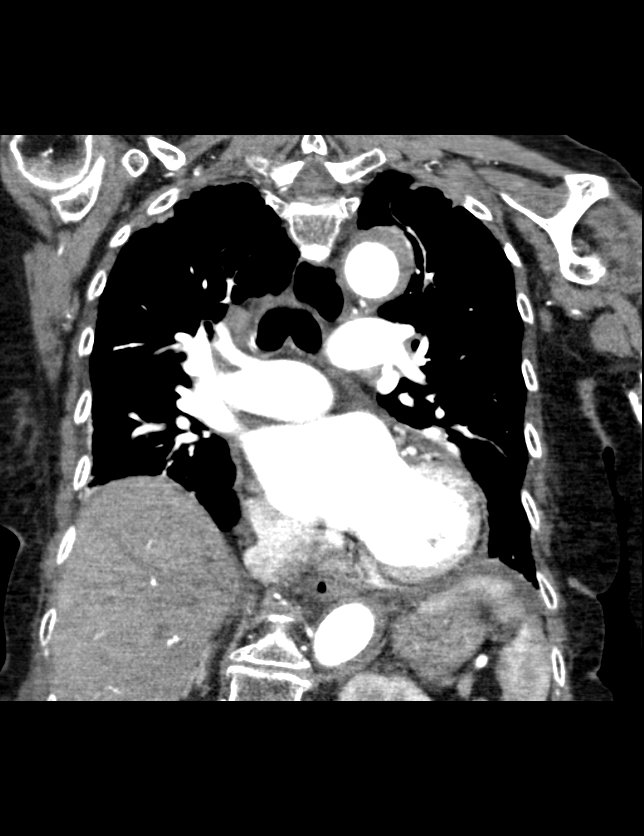

[2 of 36 positions shown; findings below may reference images not displayed]

FINDINGS: Mediastinum: Unremarkable CT appearance of the thyroid gland. No
suspicious mediastinal or hilar adenopathy. No soft tissue
mediastinal mass. The thoracic esophagus is unremarkable.

Heart/Vascular: The initial noncontrast images, crescentic high
attenuation material is noted beginning in the distal arch just
after the origin of the left subclavian artery and extending
inferiorly along the descending thoracic aorta consistent with acute
intramural hematoma. On the contrasted images, there is focal
disruption of the descending thoracic aortic intima at approximately
the 2 o'clock position (image 64 series 501) with extension of
contrast material into the media both proximally and distally. These
findings are most consistent with progression of a penetrating
aortic ulcer into acute focal dissection an propagation of
intramural hematoma. The dissection flap does not extend into the
great vessels or into the abdominal aorta although the intramural
hematoma does extend into the upper abdominal aorta.

The tubular portion of the ascending thoracic aorta is ectatic at
approximately 3.5 cm. Motion slightly limits evaluation.
Cardiomegaly with left ventricular dilatation. Atherosclerotic
calcifications present throughout the coronary arteries. Although
this study was not optimized for opacifications pulmonary arteries,
nonocclusive a central filling defects can be identified within
segmental branches of the superior segment of the right lower lobe
and within the right lower lobe branches extending into segmental
and subsegmental branches distally. No evidence of right heart
strain, the RV/LV ratio is normal.

Lungs/Pleura: Mild interlobular septal thickening in the upper lungs
consistent with early interstitial edema. There are small layering
bilateral pleural effusions as well as a mild bibasilar atelectasis.
No focal airspace consolidation. Diffuse mild lower lobe bronchial
wall thickening.

Bones/Soft Tissues: No acute fracture or aggressive appearing lytic
or blastic osseous lesion.

Upper Abdomen: Extension of intramural hematoma into the visualized
abdominal aortic aneurysm. No dissection flap.

Review of the MIP images confirms the above findings.
IMPRESSION: 1. Findings are most consistent with progression of a penetrating
aortic ulcer into acute focal dissection in the descending thoracic
aorta at the level of the left main pulmonary artery with
propagation of intramural hematoma proximally to the origin of the
left subclavian artery an distally into the upper abdominal aorta.
The focal dissection flap remains focal and localized in the
descending thoracic aorta. No evidence of rupture into the pleural
space or mediastinum at this time. No significant interval change
comparing to the noncontrast study from yesterday. Findings would
likely be amenable to endovascular repair. Recommend consultation
(urgent, but not emergent) with cardiothoracic and vascular surgery.
2. Positive for small to moderate burden nonocclusive acute to
subacute pulmonary embolus in the lobar, segmental and subsegmental
branches of the right lower lobe pulmonary artery. There is no
evidence of associated right heart strain.
3. Cardiomegaly with left ventricular dilatation.
4. Atherosclerosis including coronary artery calcification.
5. Probable very mild early interstitial edema in the upper lungs.
6. Small bilateral layering pleural effusions.

Critical Value/emergent results were called by telephone at the time
of interpretation on 03/31/2014 at [DATE] to Dr. Giudice , who
verbally acknowledged these results.

## 2016-05-18 ENCOUNTER — Encounter (HOSPITAL_COMMUNITY): Payer: Medicare Other

## 2016-06-13 ENCOUNTER — Ambulatory Visit (HOSPITAL_COMMUNITY)
Admission: EM | Admit: 2016-06-13 | Discharge: 2016-06-13 | Disposition: A | Discharge status: Home / Self Care | Payer: Medicare Other | Attending: Internal Medicine | Admitting: Internal Medicine

## 2016-06-13 ENCOUNTER — Ambulatory Visit (INDEPENDENT_AMBULATORY_CARE_PROVIDER_SITE_OTHER): Payer: Medicare Other

## 2016-06-13 ENCOUNTER — Encounter (HOSPITAL_COMMUNITY): Payer: Self-pay | Admitting: Emergency Medicine

## 2016-06-13 DIAGNOSIS — J208 Acute bronchitis due to other specified organisms: Secondary | ICD-10-CM

## 2016-06-13 DIAGNOSIS — J441 Chronic obstructive pulmonary disease with (acute) exacerbation: Secondary | ICD-10-CM

## 2016-06-13 DIAGNOSIS — R0602 Shortness of breath: Secondary | ICD-10-CM

## 2016-06-13 DIAGNOSIS — R05 Cough: Secondary | ICD-10-CM

## 2016-06-13 MED ORDER — AZITHROMYCIN 250 MG PO TABS: 250.0000 mg | ORAL_TABLET | Freq: Every day | ORAL | 0 refills | Status: DC

## 2016-06-13 MED ORDER — IPRATROPIUM-ALBUTEROL 0.5-2.5 (3) MG/3ML IN SOLN: 3.0000 mL | Freq: Once | RESPIRATORY_TRACT | Status: DC

## 2016-06-13 MED ORDER — SPACER/AERO CHAMBER MOUTHPIECE MISC: 1.0000 | Freq: Once | 0 refills | Status: AC

## 2016-06-13 MED ORDER — METHYLPREDNISOLONE 4 MG PO TBPK: ORAL_TABLET | ORAL | 0 refills | Status: DC

## 2016-06-13 MED ORDER — ALBUTEROL SULFATE HFA 108 (90 BASE) MCG/ACT IN AERS: 1.0000 | INHALATION_SPRAY | Freq: Four times a day (QID) | RESPIRATORY_TRACT | 0 refills | Status: DC | PRN

## 2016-06-13 NOTE — ED Triage Notes (Signed)
[facility] a [facility] headache and cough and shortness of breath x 4 days.

## 2016-06-13 NOTE — ED Provider Notes (Signed)
CSN: [id number]     Arrival date & time 06/13/16  1531 History   First MD Initiated Contact with Patient 06/13/16 1640     Chief Complaint  Patient presents with  . Cough   (Consider location/radiation/quality/duration/timing/severity/associated sxs/prior Treatment) Patient c/o cough and SOB x 4 days and fatigue.  She has hx of COPD   The history is provided by the patient.  Cough  Cough characteristics:  Non-productive and productive Sputum characteristics:  White Severity:  Moderate Onset quality:  Sudden Duration:  4 days Timing:  Constant Chronicity:  Recurrent Smoker: no   Context: upper respiratory infection and weather changes   Worsened by:  Nothing Associated symptoms: shortness of breath     Past Medical History:  Diagnosis Date  . Osteoporosis   . Raynaud's disease   . Scoliosis    Past Surgical History:  Procedure Laterality Date  . APPENDECTOMY     at age 81  . CHEST TUBE INSERTION Left 04/04/2014   Procedure: CHEST TUBE INSERTION;  Surgeon: Nada LibmanVance W Brabham, MD;  [facility];  Service: Vascular;  Laterality: Left;  by Dr. Collier BullockBarttle  . INSERTION OF VENA CAVA FILTER N/A 04/02/2014   Procedure: INSERTION OF VENA CAVA FILTER;  Surgeon: Nada LibmanVance W Brabham, MD;  Location: [facility] CATH LAB;  Service: Cardiovascular;  Laterality: N/A;  . PERIPHERAL VASCULAR CATHETERIZATION N/A 06/26/2014   Procedure: IVC Filter Removal;  Surgeon: Nada LibmanVance W Brabham, MD;  Location: [facility] INVASIVE CV LAB;  Service: Cardiovascular;  Laterality: N/A;  . THORACIC AORTIC ENDOVASCULAR STENT GRAFT N/A 04/04/2014   Procedure: THORACIC AORTIC ENDOVASCULAR STENT GRAFT;  Surgeon: Nada LibmanVance W Brabham, MD;  [facility];  Service: Vascular;  Laterality: N/A;   History reviewed. No pertinent family history. Social History  Substance Use Topics  . Smoking status: Never Smoker  . Smokeless tobacco: Not on file  . Alcohol use No   OB History    No data available     Review of Systems  Constitutional:  Positive for fatigue.  HENT: Negative.   Eyes: Negative.   Respiratory: Positive for cough and shortness of breath.   Cardiovascular: Negative.   Gastrointestinal: Negative.   Endocrine: Negative.   Genitourinary: Negative.   Musculoskeletal: Negative.   Allergic/Immunologic: Negative.   Psychiatric/Behavioral: Negative.     Allergies  Fentanyl  Home Medications   Prior to Admission medications   Medication Sig Start Date End Date Taking? Authorizing Provider  albuterol (PROVENTIL HFA;VENTOLIN HFA) 108 (90 Base) MCG/ACT inhaler Inhale 1-2 puffs into the lungs every 6 (six) hours as needed for wheezing or shortness of breath. 06/13/16   Deatra Canterxford, William J, FNP  azithromycin (ZITHROMAX) 250 MG tablet Take 1 tablet (250 mg total) by mouth daily. Take first 2 tablets together, then 1 every day until finished. 06/13/16   Deatra Canterxford, William J, FNP  methylPREDNISolone (MEDROL DOSEPAK) 4 MG TBPK tablet Take 6-5-4-3-2-1 po qd 06/13/16   Deatra Canterxford, William J, FNP  Spacer/Aero Chamber Mouthpiece MISC 1 Inhaler by Does not apply route once. 06/13/16 06/13/16  Deatra Canterxford, William J, FNP   Meds Ordered and Administered this Visit  Medications - No data to display  BP (!) 156/62 (BP Location: Right Arm)   Pulse 71   Temp 97.9 F (36.6 C) (Oral)   Resp 20   SpO2 98%  No data found.   Physical Exam  Constitutional: She is oriented to person, place, and time. She appears well-developed and well-nourished.  HENT:  Head: Normocephalic.  Right  Ear: External ear normal.  Left Ear: External ear normal.  Mouth/Throat: Oropharynx is clear and moist.  Eyes: Conjunctivae and EOM are normal. Pupils are equal, round, and reactive to light.  Neck: Normal range of motion. Neck supple.  Cardiovascular: Normal rate, regular rhythm and normal heart sounds.   Pulmonary/Chest: Effort normal. She has wheezes. She has rales.  Neurological: She is alert and oriented to person, place, and time.  Nursing note and vitals  reviewed.   Urgent Care Course     Procedures (including critical care time)  Labs Review Labs Reviewed - No data to display  Imaging Review Dg Chest 2 View  Result Date: 06/13/2016 CLINICAL DATA:  Shortness of breath and cough. EXAM: CHEST  2 VIEW COMPARISON:  04/05/2016 FINDINGS: Cardiomegaly and aortic tortuosity. Aortic stent grafting of the arch and descending segment. Mild interstitial coarsening at the bases correlating with subpleural reticulation and mild fibrosis on 2017 chest CT. No air bronchogram, pleural effusion, or pneumothorax. IMPRESSION: 1. Stable from prior.  No evidence of acute disease. 2. Chronic lung disease. 3. Cardiomegaly and aortic stent graft. Electronically Signed   By: Marnee Spring M.D.   On: 06/13/2016 17:04     Visual Acuity Review  Right Eye Distance:   Left Eye Distance:   Bilateral Distance:    Right Eye Near:   Left Eye Near:    Bilateral Near:         MDM   1. Acute bronchitis due to other specified organisms   2. COPD exacerbation (HCC)    Neb treatment Duoneb patient declines  Zpak Medrol dose pak Albuterol MDI Spacer  Follow up with your PCP    Deatra Canter, FNP 06/13/16 1812

## 2016-08-24 ENCOUNTER — Ambulatory Visit (HOSPITAL_COMMUNITY)
Admission: RE | Admit: 2016-08-24 | Discharge: 2016-08-24 | Disposition: A | Discharge status: Home / Self Care | Payer: Medicare Other | Source: Ambulatory Visit | Attending: Internal Medicine | Admitting: Internal Medicine

## 2016-08-24 DIAGNOSIS — J441 Chronic obstructive pulmonary disease with (acute) exacerbation: Secondary | ICD-10-CM | POA: Diagnosis present

## 2016-08-24 LAB — SPIROMETRY WITH GRAPH
FEF 25-75 Pre: 1.15 L/sec
FEF2575-%Pred-Pre: 86 %
FEV1-%Pred-Pre: 66 %
FEV1-Pre: 1.19 L
FEV1FVC-%PRED-PRE: 109 %
FEV6-%PRED-PRE: 64 %
FEV6-PRE: 1.46 L
FEV6FVC-%Pred-Pre: 105 %
FVC-%Pred-Pre: 60 %
FVC-PRE: 1.46 L
Pre FEV1/FVC ratio: 81 %
Pre FEV6/FVC Ratio: 100 %

## 2016-09-06 DIAGNOSIS — Z23 Encounter for immunization: Secondary | ICD-10-CM | POA: Diagnosis not present

## 2016-10-19 DIAGNOSIS — I10 Essential (primary) hypertension: Secondary | ICD-10-CM | POA: Diagnosis not present

## 2016-10-19 DIAGNOSIS — I700 Atherosclerosis of aorta: Secondary | ICD-10-CM | POA: Diagnosis not present

## 2016-10-19 DIAGNOSIS — E559 Vitamin D deficiency, unspecified: Secondary | ICD-10-CM | POA: Diagnosis not present

## 2016-10-19 DIAGNOSIS — R0989 Other specified symptoms and signs involving the circulatory and respiratory systems: Secondary | ICD-10-CM | POA: Diagnosis not present

## 2016-11-23 DIAGNOSIS — Z23 Encounter for immunization: Secondary | ICD-10-CM | POA: Diagnosis not present

## 2017-03-03 DIAGNOSIS — H02403 Unspecified ptosis of bilateral eyelids: Secondary | ICD-10-CM | POA: Diagnosis not present

## 2017-03-03 DIAGNOSIS — H04123 Dry eye syndrome of bilateral lacrimal glands: Secondary | ICD-10-CM | POA: Diagnosis not present

## 2017-03-03 DIAGNOSIS — H43813 Vitreous degeneration, bilateral: Secondary | ICD-10-CM | POA: Diagnosis not present

## 2017-03-03 DIAGNOSIS — H25813 Combined forms of age-related cataract, bilateral: Secondary | ICD-10-CM | POA: Diagnosis not present

## 2017-04-27 ENCOUNTER — Ambulatory Visit
Admission: RE | Admit: 2017-04-27 | Discharge: 2017-04-27 | Disposition: A | Discharge status: Home / Self Care | Payer: Medicare Other | Source: Ambulatory Visit | Attending: Internal Medicine | Admitting: Internal Medicine

## 2017-04-27 ENCOUNTER — Other Ambulatory Visit: Payer: Self-pay | Admitting: Internal Medicine

## 2017-04-27 DIAGNOSIS — R053 Chronic cough: Secondary | ICD-10-CM

## 2017-04-27 DIAGNOSIS — R05 Cough: Secondary | ICD-10-CM

## 2017-04-27 DIAGNOSIS — R0989 Other specified symptoms and signs involving the circulatory and respiratory systems: Secondary | ICD-10-CM | POA: Diagnosis not present

## 2017-04-27 DIAGNOSIS — J449 Chronic obstructive pulmonary disease, unspecified: Secondary | ICD-10-CM | POA: Diagnosis not present

## 2017-07-13 DIAGNOSIS — J449 Chronic obstructive pulmonary disease, unspecified: Secondary | ICD-10-CM | POA: Diagnosis not present

## 2017-07-13 DIAGNOSIS — I700 Atherosclerosis of aorta: Secondary | ICD-10-CM | POA: Diagnosis not present

## 2017-07-13 DIAGNOSIS — I10 Essential (primary) hypertension: Secondary | ICD-10-CM | POA: Diagnosis not present

## 2017-07-13 DIAGNOSIS — R0989 Other specified symptoms and signs involving the circulatory and respiratory systems: Secondary | ICD-10-CM | POA: Diagnosis not present

## 2017-07-13 DIAGNOSIS — Z0000 Encounter for general adult medical examination without abnormal findings: Secondary | ICD-10-CM | POA: Diagnosis not present

## 2017-07-13 DIAGNOSIS — Z1389 Encounter for screening for other disorder: Secondary | ICD-10-CM | POA: Diagnosis not present

## 2017-08-18 ENCOUNTER — Other Ambulatory Visit: Payer: Self-pay | Admitting: Internal Medicine

## 2017-08-18 DIAGNOSIS — Z1231 Encounter for screening mammogram for malignant neoplasm of breast: Secondary | ICD-10-CM

## 2017-08-19 ENCOUNTER — Ambulatory Visit
Admission: RE | Admit: 2017-08-19 | Discharge: 2017-08-19 | Disposition: A | Discharge status: Home / Self Care | Payer: Medicare Other | Source: Ambulatory Visit | Attending: Internal Medicine | Admitting: Internal Medicine

## 2017-08-19 DIAGNOSIS — Z1231 Encounter for screening mammogram for malignant neoplasm of breast: Secondary | ICD-10-CM

## 2017-08-22 ENCOUNTER — Other Ambulatory Visit: Payer: Self-pay | Admitting: Internal Medicine

## 2017-08-22 DIAGNOSIS — R928 Other abnormal and inconclusive findings on diagnostic imaging of breast: Secondary | ICD-10-CM

## 2017-09-09 ENCOUNTER — Ambulatory Visit
Admission: RE | Admit: 2017-09-09 | Discharge: 2017-09-09 | Disposition: A | Discharge status: Home / Self Care | Payer: Medicare Other | Source: Ambulatory Visit | Attending: Internal Medicine | Admitting: Internal Medicine

## 2017-09-09 DIAGNOSIS — D485 Neoplasm of uncertain behavior of skin: Secondary | ICD-10-CM | POA: Diagnosis not present

## 2017-09-09 DIAGNOSIS — C44329 Squamous cell carcinoma of skin of other parts of face: Secondary | ICD-10-CM | POA: Diagnosis not present

## 2017-09-09 DIAGNOSIS — R928 Other abnormal and inconclusive findings on diagnostic imaging of breast: Secondary | ICD-10-CM

## 2017-09-09 DIAGNOSIS — N6489 Other specified disorders of breast: Secondary | ICD-10-CM | POA: Diagnosis not present

## 2017-09-09 DIAGNOSIS — L821 Other seborrheic keratosis: Secondary | ICD-10-CM | POA: Diagnosis not present

## 2017-10-11 DIAGNOSIS — D0439 Carcinoma in situ of skin of other parts of face: Secondary | ICD-10-CM | POA: Diagnosis not present

## 2017-10-18 ENCOUNTER — Other Ambulatory Visit: Payer: Self-pay

## 2017-10-18 DIAGNOSIS — I714 Abdominal aortic aneurysm, without rupture, unspecified: Secondary | ICD-10-CM

## 2017-11-23 ENCOUNTER — Other Ambulatory Visit: Payer: Medicare Other

## 2017-11-28 ENCOUNTER — Ambulatory Visit: Payer: Medicare Other | Admitting: Surgery

## 2017-11-29 DIAGNOSIS — Z23 Encounter for immunization: Secondary | ICD-10-CM | POA: Diagnosis not present

## 2017-12-27 ENCOUNTER — Other Ambulatory Visit: Payer: Self-pay | Admitting: Surgery

## 2017-12-29 ENCOUNTER — Ambulatory Visit
Admission: RE | Admit: 2017-12-29 | Discharge: 2017-12-29 | Disposition: A | Discharge status: Home / Self Care | Payer: Medicare Other | Source: Ambulatory Visit | Attending: Surgery | Admitting: Surgery

## 2017-12-29 ENCOUNTER — Other Ambulatory Visit: Payer: Self-pay | Admitting: Surgery

## 2017-12-29 DIAGNOSIS — I77810 Thoracic aortic ectasia: Secondary | ICD-10-CM | POA: Diagnosis not present

## 2017-12-29 DIAGNOSIS — I700 Atherosclerosis of aorta: Secondary | ICD-10-CM | POA: Diagnosis not present

## 2017-12-29 DIAGNOSIS — I714 Abdominal aortic aneurysm, without rupture, unspecified: Secondary | ICD-10-CM

## 2017-12-29 MED ORDER — IOPAMIDOL (ISOVUE-370) INJECTION 76%
75.0000 mL | Freq: Once | INTRAVENOUS | Status: AC | PRN
  Administered 2017-12-29: 75 mL via INTRAVENOUS

## 2018-01-05 ENCOUNTER — Encounter: Payer: Self-pay | Admitting: Surgery

## 2018-01-05 ENCOUNTER — Ambulatory Visit (INDEPENDENT_AMBULATORY_CARE_PROVIDER_SITE_OTHER): Payer: Medicare Other | Admitting: Surgery

## 2018-01-05 ENCOUNTER — Other Ambulatory Visit: Payer: Self-pay

## 2018-01-05 VITALS — BP 151/71 | HR 66 | Resp 16 | Ht 61.0 in | Wt 126.0 lb

## 2018-01-05 DIAGNOSIS — I712 Thoracic aortic aneurysm, without rupture, unspecified: Secondary | ICD-10-CM

## 2018-01-05 DIAGNOSIS — I714 Abdominal aortic aneurysm, without rupture, unspecified: Secondary | ICD-10-CM

## 2018-01-05 NOTE — Progress Notes (Signed)
[facility] [place]  Patient name: Stephanie Collier Burgueno MRN: [id number] DOB: September 01, 1935 Sex: female   REASON FOR VISIT:    Follow up  HISOTRY OF PRESENT ILLNESS:    Stephanie Collier Can is a 82 y.o. female who is back for follow-up. 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 She was found to have a DVT as well as PE. She is not a candidate for anticoagulation given her intramural hematoma, therefore a IVC filter was placed prior to her operation.her IVC filter was removed in 2016.  She continues to take an ARB for her hypertension.  PAST MEDICAL HISTORY:   Past Medical History:  Diagnosis Date  . Osteoporosis   . Raynaud's disease   . Scoliosis      FAMILY HISTORY:   Family History  Problem Relation Age of Onset  . Breast cancer Daughter [id number]    SOCIAL HISTORY:   Social History   Tobacco Use  . Smoking status: Never Smoker  . Smokeless tobacco: Never Used  Substance Use Topics  . Alcohol use: No     ALLERGIES:   Allergies  Allergen Reactions  . Fentanyl Shortness Of Breath     CURRENT MEDICATIONS:   Current Outpatient Medications  Medication Sig Dispense Refill  . losartan (COZAAR) 25 MG tablet Take 25 mg by mouth daily.    Marland Kitchen. albuterol (PROVENTIL HFA;VENTOLIN HFA) 108 (90 Base) MCG/ACT inhaler Inhale 1-2 puffs into the lungs every 6 (six) hours as needed for wheezing or shortness of breath. (Patient not taking: Reported on 01/05/2018) 1 Inhaler 0  . azithromycin (ZITHROMAX) 250 MG tablet Take 1 tablet (250 mg total) by mouth daily. Take first 2 tablets together, then 1 every day until finished. (Patient not taking: Reported on 01/05/2018) 6 tablet 0  .  methylPREDNISolone (MEDROL DOSEPAK) 4 MG TBPK tablet Take 6-5-4-3-2-1 po qd (Patient not taking: Reported on 01/05/2018) 21 tablet 0   No current facility-administered medications for this visit.     REVIEW OF SYSTEMS:   [X]  denotes positive finding, [ ]  denotes negative finding Cardiac  Comments:  Chest pain or chest pressure:    Shortness of breath upon exertion:    Short of breath when lying flat:    Irregular heart rhythm:        Vascular    Pain in calf, thigh, or hip brought on by ambulation:    Pain in feet at night that wakes you up from your sleep:     Blood clot in your veins:    Leg swelling:         Pulmonary    Oxygen at home:    Productive cough:     Wheezing:         Neurologic    Sudden weakness in arms or legs:     Sudden numbness in arms or legs:     Sudden onset of difficulty speaking or slurred speech:    Temporary loss of vision in one eye:     Problems with dizziness:         Gastrointestinal    Blood in stool:     Vomited blood:         Genitourinary    Burning when urinating:     Blood in  urine:        Psychiatric    Major depression:         Hematologic    Bleeding problems:    Problems with blood clotting too easily:        Skin    Rashes or ulcers:        Constitutional    Fever or chills:      PHYSICAL EXAM:   Vitals:   01/05/18 0859  BP: (!) 151/71  Pulse: 66  Resp: 16  SpO2: 99%  Weight: 126 lb (57.2 kg)  Height: 5\' 1"  (1.549 m)    GENERAL: The patient is a well-nourished female, in no acute distress. The vital signs are documented above. CARDIAC: There is a regular rate and rhythm.  VASCULAR: No carotid bruits PULMONARY: Non-labored respirations MUSCULOSKELETAL: There are no major deformities or cyanosis. NEUROLOGIC: No focal weakness or paresthesias are detected. SKIN: There are no ulcers or rashes noted. PSYCHIATRIC: The patient has a normal affect.  STUDIES:   I have reviewed her CTA with the following  findings: CTA CHEST  1. Surgical changes of prior thoracic endovascular aortic repair without evidence of residual aortic aneurysm or dissection. 2. Stable mild focal penetrating aortic ulceration just distal to the distal landing zone of the stent graft. The aorta measures up to 3.0 cm at this location. 3. Coronary artery calcifications. 4. Stable T6 compression fracture without interval progression. 5. Cardiomegaly. 6. Mild changes of pulmonary fibrosis.  CTA ABD/PELVIS  1. Similar to incrementally enlarged fusiform aneurysmal dilatation of the infrarenal abdominal aorta with a maximal diameter of 3.3 cm. Aortic Atherosclerosis (ICD10-I70.0). Aortic aneurysm NOS (ICD10-I71.9). 2. 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 3. Stable ectasia of the right common iliac artery with a maximal diameter of 2.0 cm. 4. 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 5. No acute abnormality in the abdomen or pelvis. MEDICAL ISSUES:   Patient will follow-up in 2 years with a MRI instead of a CT scan because she does not like the way the contrast makes her feel.  She understands that if she has a surgical issue I will need to get a CT scan.  I am ordering an MRI of her chest and ultrasound of her abdomen.    Durene CalWells Brabham, MD [facility] Tel [phone] Pager [phone]

## 2018-09-05 ENCOUNTER — Telehealth: Payer: Self-pay

## 2018-09-05 NOTE — Telephone Encounter (Signed)
Pt called and said that she had a medication question and would like a call back.   Called pt and she said that she was given Cipro by another doctor and she worried about taking it with her aortic dissection. She said that she had spoken to the pharmacist and they told her that there was a 1% chance it could cause issues. She said that with her age she wanted to know if Dr Trula Slade thought this was a concern.   Advised her that Dr Trula Slade was not in the office and they have to list all possible side effects but we cannot tell her if she would be one to have concerns. Advised she call her dr that prescribed the medication and see what he would suggest as they are the ones aware of what it is being prescribed for. She was not happy that I told her that she should call them but I told her if a culture was done given she said that it was given for a UTI, that it could be that it was the only medication that the infection was sensitive to.   Explained that it was important to treat the infection and again recommended if she has concerns to call her prescribing physician.   York Cerise, CMA

## 2018-09-20 DIAGNOSIS — N8189 Other female genital prolapse: Secondary | ICD-10-CM | POA: Diagnosis not present

## 2018-09-20 DIAGNOSIS — R350 Frequency of micturition: Secondary | ICD-10-CM | POA: Diagnosis not present

## 2018-09-20 DIAGNOSIS — R739 Hyperglycemia, unspecified: Secondary | ICD-10-CM | POA: Diagnosis not present

## 2018-09-20 DIAGNOSIS — R1030 Lower abdominal pain, unspecified: Secondary | ICD-10-CM | POA: Diagnosis not present

## 2018-09-25 DIAGNOSIS — R1030 Lower abdominal pain, unspecified: Secondary | ICD-10-CM | POA: Diagnosis not present

## 2018-10-12 ENCOUNTER — Other Ambulatory Visit: Payer: Self-pay | Admitting: Internal Medicine

## 2018-10-12 DIAGNOSIS — N6489 Other specified disorders of breast: Secondary | ICD-10-CM

## 2018-10-17 DIAGNOSIS — R350 Frequency of micturition: Secondary | ICD-10-CM | POA: Diagnosis not present

## 2018-10-17 DIAGNOSIS — I10 Essential (primary) hypertension: Secondary | ICD-10-CM | POA: Diagnosis not present

## 2018-10-17 DIAGNOSIS — I701 Atherosclerosis of renal artery: Secondary | ICD-10-CM | POA: Diagnosis not present

## 2018-10-23 ENCOUNTER — Ambulatory Visit
Admission: RE | Admit: 2018-10-23 | Discharge: 2018-10-23 | Disposition: A | Discharge status: Home / Self Care | Payer: Medicare Other | Source: Ambulatory Visit | Attending: Internal Medicine | Admitting: Internal Medicine

## 2018-10-23 ENCOUNTER — Other Ambulatory Visit: Payer: Self-pay

## 2018-10-23 DIAGNOSIS — N6489 Other specified disorders of breast: Secondary | ICD-10-CM

## 2018-10-23 DIAGNOSIS — R928 Other abnormal and inconclusive findings on diagnostic imaging of breast: Secondary | ICD-10-CM | POA: Diagnosis not present

## 2018-11-13 DIAGNOSIS — Z803 Family history of malignant neoplasm of breast: Secondary | ICD-10-CM | POA: Insufficient documentation

## 2018-11-13 DIAGNOSIS — R9389 Abnormal findings on diagnostic imaging of other specified body structures: Secondary | ICD-10-CM | POA: Diagnosis not present

## 2018-11-16 DIAGNOSIS — Z23 Encounter for immunization: Secondary | ICD-10-CM | POA: Diagnosis not present

## 2019-02-07 ENCOUNTER — Ambulatory Visit: Payer: Medicare Other

## 2019-02-16 ENCOUNTER — Ambulatory Visit: Payer: Medicare Other | Attending: Internal Medicine

## 2019-02-16 DIAGNOSIS — Z23 Encounter for immunization: Secondary | ICD-10-CM

## 2019-02-16 NOTE — Progress Notes (Signed)
   Covid-19 Vaccination Clinic  Name:  Jazzalynn Rhudy    MRN: [id number] DOB: [birth year]  02/16/2019  Ms. Woodford was observed post Covid-19 immunization for 15 minutes without incidence. She was provided with Vaccine Information Sheet and instruction to access the V-Safe system.   Ms. Lewing was instructed to call 911 with any severe reactions post vaccine: Marland Kitchen Difficulty breathing  . Swelling of your face and throat  . A fast heartbeat  . A bad rash all over your body  . Dizziness and weakness    Immunizations Administered    Name Date Dose VIS Date Route   Pfizer COVID-19 Vaccine 02/16/2019  8:41 AM 0.3 mL 12/22/2018 Intramuscular   Manufacturer: [employer], [employer]   Lot: [id number]   NDC: [address]-4

## 2019-03-13 ENCOUNTER — Ambulatory Visit: Payer: Medicare Other | Attending: Internal Medicine

## 2019-03-13 DIAGNOSIS — Z23 Encounter for immunization: Secondary | ICD-10-CM | POA: Insufficient documentation

## 2019-03-13 NOTE — Progress Notes (Signed)
   Covid-19 Vaccination Clinic  Name:  Ahuva Poynor    MRN: [id number] DOB: [birth year]  03/13/2019  Ms. Margerum was observed post Covid-19 immunization for 15 minutes without incident. She was provided with Vaccine Information Sheet and instruction to access the V-Safe system.   Ms. Wiehe was instructed to call 911 with any severe reactions post vaccine: Marland Kitchen Difficulty breathing  . Swelling of face and throat  . A fast heartbeat  . A bad rash all over body  . Dizziness and weakness   Immunizations Administered    Name Date Dose VIS Date Route   Pfizer COVID-19 Vaccine 03/13/2019  9:17 AM 0.3 mL 12/22/2018 Intramuscular   Manufacturer: [employer], [employer]   Lot: [id number]   NDC: [address]-1

## 2019-04-05 DIAGNOSIS — Z0120 Encounter for dental examination and cleaning without abnormal findings: Secondary | ICD-10-CM | POA: Diagnosis not present

## 2019-05-28 DIAGNOSIS — Z0000 Encounter for general adult medical examination without abnormal findings: Secondary | ICD-10-CM | POA: Diagnosis not present

## 2019-05-28 DIAGNOSIS — I10 Essential (primary) hypertension: Secondary | ICD-10-CM | POA: Diagnosis not present

## 2019-05-28 DIAGNOSIS — Z23 Encounter for immunization: Secondary | ICD-10-CM | POA: Diagnosis not present

## 2019-05-28 DIAGNOSIS — Z1389 Encounter for screening for other disorder: Secondary | ICD-10-CM | POA: Diagnosis not present

## 2019-05-28 DIAGNOSIS — I700 Atherosclerosis of aorta: Secondary | ICD-10-CM | POA: Diagnosis not present

## 2019-05-28 DIAGNOSIS — R350 Frequency of micturition: Secondary | ICD-10-CM | POA: Diagnosis not present

## 2019-05-28 DIAGNOSIS — I701 Atherosclerosis of renal artery: Secondary | ICD-10-CM | POA: Diagnosis not present

## 2019-08-21 DIAGNOSIS — M79672 Pain in left foot: Secondary | ICD-10-CM | POA: Diagnosis not present

## 2019-08-22 ENCOUNTER — Ambulatory Visit
Admission: RE | Admit: 2019-08-22 | Discharge: 2019-08-22 | Disposition: A | Discharge status: Home / Self Care | Payer: Medicare Other | Source: Ambulatory Visit | Attending: Internal Medicine | Admitting: Internal Medicine

## 2019-08-22 ENCOUNTER — Other Ambulatory Visit: Payer: Self-pay | Admitting: Internal Medicine

## 2019-08-22 DIAGNOSIS — M7989 Other specified soft tissue disorders: Secondary | ICD-10-CM | POA: Diagnosis not present

## 2019-08-22 DIAGNOSIS — S99922A Unspecified injury of left foot, initial encounter: Secondary | ICD-10-CM | POA: Diagnosis not present

## 2019-08-22 DIAGNOSIS — M79672 Pain in left foot: Secondary | ICD-10-CM

## 2019-08-30 ENCOUNTER — Ambulatory Visit: Payer: Medicare Other | Admitting: Podiatry

## 2019-08-30 ENCOUNTER — Encounter: Payer: Self-pay | Admitting: Podiatry

## 2019-08-30 ENCOUNTER — Ambulatory Visit (INDEPENDENT_AMBULATORY_CARE_PROVIDER_SITE_OTHER): Payer: Medicare Other

## 2019-08-30 ENCOUNTER — Other Ambulatory Visit: Payer: Self-pay

## 2019-08-30 VITALS — Temp 97.8°F

## 2019-08-30 DIAGNOSIS — S9002XA Contusion of left ankle, initial encounter: Secondary | ICD-10-CM | POA: Diagnosis not present

## 2019-08-30 NOTE — Progress Notes (Signed)
Subjective:   Patient ID: Stephanie Collier, female   DOB: 84 y.o.   MRN: [id number]   HPI Patient is concerned that she may have broken her left ankle or foot after she fell out of a chair 3 weeks ago.  Patient presents with caregiver and has discomfort and is concerned about this.  Patient does not smoke likes to be active   Review of Systems  All other systems reviewed and are negative.       Objective:  Physical Exam Vitals and nursing note reviewed.  Constitutional:      Appearance: She is well-developed.  Pulmonary:     Effort: Pulmonary effort is normal.  Musculoskeletal:        General: Normal range of motion.  Skin:    General: Skin is warm.  Neurological:     Mental Status: She is alert.     Neurovascular status found to be intact bilateral with negative Denna Haggard' sign bilateral.  Patient has mild edema of the left lateral ankle and into the midfoot with slight redness no breakage of skin secondary to the injury and it is painful when the lateral malleolus is pressed and distal.  Good digital perfusion noted     Assessment:  Possibility for fracture versus contusion of the left lateral ankle foot     Plan:  H&P conditions reviewed.  At this point I went ahead and I advised this patient on reduced activity ice therapy and mobilization just to be done with simple technique and I do not recommend casting as I do not think she will be able to tolerate this and I am worried about falling.  Patient does not have fracture alignment caregiver aware of this  X-ray indicates no signs of fracture or bone pathology currently with a lot of arthritis.  There is some slight change on the lateral malleolus but I think this is old and does not represent an acute fracture

## 2019-08-31 ENCOUNTER — Ambulatory Visit: Payer: Medicare Other | Admitting: Podiatry

## 2019-10-22 ENCOUNTER — Other Ambulatory Visit: Payer: Self-pay | Admitting: Internal Medicine

## 2019-10-22 DIAGNOSIS — N6489 Other specified disorders of breast: Secondary | ICD-10-CM

## 2019-11-13 DIAGNOSIS — Z0120 Encounter for dental examination and cleaning without abnormal findings: Secondary | ICD-10-CM | POA: Diagnosis not present

## 2019-11-27 ENCOUNTER — Other Ambulatory Visit: Payer: Self-pay

## 2019-11-27 ENCOUNTER — Ambulatory Visit
Admission: RE | Admit: 2019-11-27 | Discharge: 2019-11-27 | Disposition: A | Discharge status: Home / Self Care | Payer: Medicare Other | Source: Ambulatory Visit | Attending: Internal Medicine | Admitting: Internal Medicine

## 2019-11-27 DIAGNOSIS — R928 Other abnormal and inconclusive findings on diagnostic imaging of breast: Secondary | ICD-10-CM | POA: Diagnosis not present

## 2019-11-27 DIAGNOSIS — N6489 Other specified disorders of breast: Secondary | ICD-10-CM

## 2019-12-11 DIAGNOSIS — Z23 Encounter for immunization: Secondary | ICD-10-CM | POA: Diagnosis not present

## 2020-01-18 ENCOUNTER — Other Ambulatory Visit: Payer: Self-pay | Admitting: *Deleted

## 2020-01-18 DIAGNOSIS — I712 Thoracic aortic aneurysm, without rupture, unspecified: Secondary | ICD-10-CM

## 2020-02-25 ENCOUNTER — Ambulatory Visit: Payer: Medicare Other | Admitting: Surgery

## 2020-04-17 ENCOUNTER — Inpatient Hospital Stay: Admission: RE | Admit: 2020-04-17 | Payer: Medicare Other | Source: Ambulatory Visit

## 2020-05-01 ENCOUNTER — Ambulatory Visit
Admission: RE | Admit: 2020-05-01 | Discharge: 2020-05-01 | Disposition: A | Discharge status: Home / Self Care | Payer: Medicare Other | Source: Ambulatory Visit | Attending: Surgery | Admitting: Surgery

## 2020-05-01 ENCOUNTER — Other Ambulatory Visit: Payer: Self-pay

## 2020-05-01 DIAGNOSIS — I712 Thoracic aortic aneurysm, without rupture, unspecified: Secondary | ICD-10-CM

## 2020-05-12 ENCOUNTER — Ambulatory Visit: Payer: Medicare Other | Admitting: Surgery

## 2020-05-19 ENCOUNTER — Encounter: Payer: Self-pay | Admitting: Surgery

## 2020-05-19 ENCOUNTER — Other Ambulatory Visit: Payer: Self-pay

## 2020-05-19 ENCOUNTER — Ambulatory Visit: Payer: Medicare Other | Admitting: Surgery

## 2020-05-19 VITALS — BP 174/72 | HR 70 | Temp 97.9°F | Resp 20 | Ht 61.0 in | Wt 120.0 lb

## 2020-05-19 DIAGNOSIS — I712 Thoracic aortic aneurysm, without rupture, unspecified: Secondary | ICD-10-CM

## 2020-05-19 NOTE — Progress Notes (Signed)
[facility]  Patient name: Stephanie Collier MRN: [id number] DOB: 10/19/1935 Sex: female   REASON FOR VISIT:    Follow up  HISOTRY OF PRESENT ILLNESS:    Stephanie Collier is a 85 y.o. female who is back for follow-up. 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  Prior to her operation she suffered a viral illness which kept her in bed for 2 weeks. She was found to have a DVT as well as PE. She had a IVC filter placed in an effort to avoid anticoagulation.  Her filter was removed several weeks after her procedure.  She continues to take an ARB for her hypertension.   PAST MEDICAL HISTORY:   Past Medical History:  Diagnosis Date  . AAA (abdominal aortic aneurysm) (HCC)   . Osteoporosis   . Raynaud's disease   . Scoliosis      FAMILY HISTORY:   Family History  Problem Relation Age of Onset  . Breast cancer Daughter 75    SOCIAL HISTORY:   Social History   Tobacco Use  . Smoking status: Never Smoker  . Smokeless tobacco: Never Used  Substance Use Topics  . Alcohol use: No     ALLERGIES:   Allergies  Allergen Reactions  . Fentanyl Shortness Of Breath  . Asa [Aspirin]     Per patient   . Azithromycin     Other reaction(s): diarrhea  . Fosamax [Alendronate]     Other reaction(s): indigestion  . Labetalol Hcl     Other reaction(s): indigestion     CURRENT MEDICATIONS:   Current Outpatient Medications  Medication Sig Dispense Refill  . Acetaminophen (TYLENOL) 325 MG CAPS 1 capsule as needed    . losartan (COZAAR) 25 MG tablet Take 25 mg by mouth daily.     No current facility-administered medications for this visit.    REVIEW OF SYSTEMS:   [X]  denotes positive finding, [ ]  denotes negative finding Cardiac  Comments:   Chest pain or chest pressure:    Shortness of breath upon exertion:    Short of breath when lying flat:    Irregular heart rhythm:        Vascular    Pain in calf, thigh, or hip brought on by ambulation:    Pain in feet at night that wakes you up from your sleep:     Blood clot in your veins:    Leg swelling:         Pulmonary    Oxygen at home:    Productive cough:     Wheezing:         Neurologic    Sudden weakness in arms or legs:     Sudden numbness in arms or legs:     Sudden onset of difficulty speaking or slurred speech:    Temporary loss of vision in one eye:     Problems with dizziness:         Gastrointestinal    Blood in stool:     Vomited blood:         Genitourinary    Burning when urinating:     Blood in urine:        Psychiatric    Major depression:         Hematologic    Bleeding problems:    Problems with blood  clotting too easily:        Skin    Rashes or ulcers:        Constitutional    Fever or chills:      PHYSICAL EXAM:   Vitals:   05/19/20 1448  BP: (!) 174/72  Pulse: 70  Resp: 20  Temp: 97.9 F (36.6 C)  SpO2: 94%  Weight: 120 lb (54.4 kg)  Height: 5\' 1"  (1.549 m)    GENERAL: The patient is a well-nourished female, in no acute distress. The vital signs are documented above. CARDIAC: There is a regular rate and rhythm.  VASCULAR: bilateral 1+ pitting edema PULMONARY: Non-labored respirations ABDOMEN: Soft and non-tender with normal pitched bowel sounds.  MUSCULOSKELETAL: There are no major deformities or cyanosis. NEUROLOGIC: No focal weakness or paresthesias are detected. SKIN: There are no ulcers or rashes noted. PSYCHIATRIC: The patient has a normal affect.  STUDIES:   I have reviewed the following MRI: Aorta: Mild fusiform aneurysmal dilatation of ascending thoracic aorta, maximal diameter 4 cm, previously 3.8 cm. No significant interval change. Three vessel arch anatomy. Stent graft noted of the descending  thoracic aorta as before.   MEDICAL ISSUES:   TAAA:  No interval change.  Plan to repeat scan in 2 years    , MD, FACS [facility] [phone] Pager [phone]

## 2020-05-20 ENCOUNTER — Other Ambulatory Visit: Payer: Self-pay

## 2020-05-20 DIAGNOSIS — I712 Thoracic aortic aneurysm, without rupture, unspecified: Secondary | ICD-10-CM

## 2020-05-23 ENCOUNTER — Encounter: Payer: Self-pay | Admitting: Surgery

## 2020-06-06 ENCOUNTER — Other Ambulatory Visit (HOSPITAL_COMMUNITY): Payer: Medicare Other

## 2020-06-23 ENCOUNTER — Ambulatory Visit: Payer: Medicare Other | Admitting: Surgery

## 2020-07-15 ENCOUNTER — Other Ambulatory Visit: Payer: Self-pay | Admitting: Internal Medicine

## 2020-07-15 ENCOUNTER — Other Ambulatory Visit: Payer: Self-pay | Admitting: Pediatric Surgery

## 2020-07-15 DIAGNOSIS — R1011 Right upper quadrant pain: Secondary | ICD-10-CM

## 2020-07-15 DIAGNOSIS — R1013 Epigastric pain: Secondary | ICD-10-CM | POA: Diagnosis not present

## 2020-07-15 DIAGNOSIS — R634 Abnormal weight loss: Secondary | ICD-10-CM | POA: Diagnosis not present

## 2020-07-15 DIAGNOSIS — Z0000 Encounter for general adult medical examination without abnormal findings: Secondary | ICD-10-CM | POA: Diagnosis not present

## 2020-07-15 DIAGNOSIS — Q453 Other congenital malformations of pancreas and pancreatic duct: Secondary | ICD-10-CM | POA: Diagnosis not present

## 2020-07-15 DIAGNOSIS — I10 Essential (primary) hypertension: Secondary | ICD-10-CM | POA: Diagnosis not present

## 2020-07-15 DIAGNOSIS — R350 Frequency of micturition: Secondary | ICD-10-CM | POA: Diagnosis not present

## 2020-07-15 DIAGNOSIS — Z1389 Encounter for screening for other disorder: Secondary | ICD-10-CM | POA: Diagnosis not present

## 2020-07-15 DIAGNOSIS — I701 Atherosclerosis of renal artery: Secondary | ICD-10-CM | POA: Diagnosis not present

## 2020-07-15 DIAGNOSIS — R140 Abdominal distension (gaseous): Secondary | ICD-10-CM | POA: Diagnosis not present

## 2020-07-15 DIAGNOSIS — I700 Atherosclerosis of aorta: Secondary | ICD-10-CM | POA: Diagnosis not present

## 2020-07-16 ENCOUNTER — Other Ambulatory Visit: Payer: Self-pay

## 2020-07-16 ENCOUNTER — Ambulatory Visit
Admission: RE | Admit: 2020-07-16 | Discharge: 2020-07-16 | Disposition: A | Discharge status: Home / Self Care | Payer: Medicare Other | Source: Ambulatory Visit | Attending: Internal Medicine | Admitting: Internal Medicine

## 2020-07-16 DIAGNOSIS — R1011 Right upper quadrant pain: Secondary | ICD-10-CM

## 2020-07-16 DIAGNOSIS — R350 Frequency of micturition: Secondary | ICD-10-CM | POA: Diagnosis not present

## 2020-07-24 ENCOUNTER — Other Ambulatory Visit: Payer: Self-pay | Admitting: Internal Medicine

## 2020-07-24 ENCOUNTER — Other Ambulatory Visit (HOSPITAL_COMMUNITY): Payer: Self-pay | Admitting: Internal Medicine

## 2020-07-24 DIAGNOSIS — R1011 Right upper quadrant pain: Secondary | ICD-10-CM

## 2020-07-29 ENCOUNTER — Ambulatory Visit (HOSPITAL_COMMUNITY): Payer: Medicare Other

## 2020-07-29 ENCOUNTER — Encounter (HOSPITAL_COMMUNITY): Payer: Self-pay

## 2020-08-13 DIAGNOSIS — I10 Essential (primary) hypertension: Secondary | ICD-10-CM | POA: Diagnosis not present

## 2020-08-13 DIAGNOSIS — I719 Aortic aneurysm of unspecified site, without rupture: Secondary | ICD-10-CM | POA: Diagnosis not present

## 2020-08-13 DIAGNOSIS — I701 Atherosclerosis of renal artery: Secondary | ICD-10-CM | POA: Diagnosis not present

## 2020-08-13 DIAGNOSIS — E559 Vitamin D deficiency, unspecified: Secondary | ICD-10-CM | POA: Diagnosis not present

## 2020-08-13 DIAGNOSIS — I700 Atherosclerosis of aorta: Secondary | ICD-10-CM | POA: Diagnosis not present

## 2020-11-07 DIAGNOSIS — Z23 Encounter for immunization: Secondary | ICD-10-CM | POA: Diagnosis not present

## 2020-11-13 ENCOUNTER — Other Ambulatory Visit: Payer: Self-pay

## 2020-11-13 ENCOUNTER — Emergency Department (HOSPITAL_BASED_OUTPATIENT_CLINIC_OR_DEPARTMENT_OTHER): Payer: Medicare Other

## 2020-11-13 ENCOUNTER — Encounter (HOSPITAL_BASED_OUTPATIENT_CLINIC_OR_DEPARTMENT_OTHER): Payer: Self-pay

## 2020-11-13 ENCOUNTER — Emergency Department (HOSPITAL_BASED_OUTPATIENT_CLINIC_OR_DEPARTMENT_OTHER)
Admission: EM | Admit: 2020-11-13 | Discharge: 2020-11-13 | Disposition: A | Discharge status: Home / Self Care | Payer: Medicare Other | Attending: Emergency Medicine | Admitting: Emergency Medicine

## 2020-11-13 ENCOUNTER — Emergency Department (HOSPITAL_BASED_OUTPATIENT_CLINIC_OR_DEPARTMENT_OTHER): Payer: Medicare Other | Admitting: Radiology

## 2020-11-13 DIAGNOSIS — S5001XA Contusion of right elbow, initial encounter: Secondary | ICD-10-CM | POA: Diagnosis not present

## 2020-11-13 DIAGNOSIS — I10 Essential (primary) hypertension: Secondary | ICD-10-CM | POA: Diagnosis not present

## 2020-11-13 DIAGNOSIS — W19XXXA Unspecified fall, initial encounter: Secondary | ICD-10-CM | POA: Diagnosis not present

## 2020-11-13 DIAGNOSIS — S82092A Other fracture of left patella, initial encounter for closed fracture: Secondary | ICD-10-CM

## 2020-11-13 DIAGNOSIS — S0083XA Contusion of other part of head, initial encounter: Secondary | ICD-10-CM | POA: Insufficient documentation

## 2020-11-13 DIAGNOSIS — T07XXXA Unspecified multiple injuries, initial encounter: Secondary | ICD-10-CM | POA: Diagnosis not present

## 2020-11-13 DIAGNOSIS — Y92481 Parking lot as the place of occurrence of the external cause: Secondary | ICD-10-CM | POA: Insufficient documentation

## 2020-11-13 DIAGNOSIS — S0993XA Unspecified injury of face, initial encounter: Secondary | ICD-10-CM | POA: Diagnosis not present

## 2020-11-13 DIAGNOSIS — S8992XA Unspecified injury of left lower leg, initial encounter: Secondary | ICD-10-CM | POA: Diagnosis present

## 2020-11-13 DIAGNOSIS — S82012A Displaced osteochondral fracture of left patella, initial encounter for closed fracture: Secondary | ICD-10-CM | POA: Diagnosis not present

## 2020-11-13 DIAGNOSIS — Y9301 Activity, walking, marching and hiking: Secondary | ICD-10-CM | POA: Insufficient documentation

## 2020-11-13 DIAGNOSIS — W01198A Fall on same level from slipping, tripping and stumbling with subsequent striking against other object, initial encounter: Secondary | ICD-10-CM | POA: Diagnosis not present

## 2020-11-13 DIAGNOSIS — S0081XA Abrasion of other part of head, initial encounter: Secondary | ICD-10-CM | POA: Insufficient documentation

## 2020-11-13 DIAGNOSIS — S00512A Abrasion of oral cavity, initial encounter: Secondary | ICD-10-CM | POA: Insufficient documentation

## 2020-11-13 DIAGNOSIS — M5030 Other cervical disc degeneration, unspecified cervical region: Secondary | ICD-10-CM | POA: Diagnosis not present

## 2020-11-13 DIAGNOSIS — S82002A Unspecified fracture of left patella, initial encounter for closed fracture: Secondary | ICD-10-CM | POA: Insufficient documentation

## 2020-11-13 DIAGNOSIS — S8001XA Contusion of right knee, initial encounter: Secondary | ICD-10-CM | POA: Diagnosis not present

## 2020-11-13 DIAGNOSIS — Z043 Encounter for examination and observation following other accident: Secondary | ICD-10-CM | POA: Diagnosis not present

## 2020-11-13 DIAGNOSIS — S80919A Unspecified superficial injury of unspecified knee, initial encounter: Secondary | ICD-10-CM | POA: Diagnosis not present

## 2020-11-13 DIAGNOSIS — S00211A Abrasion of right eyelid and periocular area, initial encounter: Secondary | ICD-10-CM | POA: Diagnosis not present

## 2020-11-13 NOTE — ED Notes (Signed)
Patient transported to CT 

## 2020-11-13 NOTE — ED Provider Notes (Signed)
[facility] EMERGENCY DEPT Provider Note   CSN: [id number] Arrival date & time: 11/13/20  1430     History Chief Complaint  Patient presents with   Marletta Lor    Stephanie Collier is a 85 y.o. female.  She is presenting by EMS for evaluation of injuries from a fall.  She said she was at the [employer] parking lot and just put the cart away and was walking back to her car when she tripped and fell on her face elbow knees.  No loss of consciousness.  Not on blood thinners.  Complaining of pain in her knees left greater than right.  Also pain in her right elbow and pain in her face.  No neck or back pain.  No numbness or weakness.  No blurred vision double vision chest pain shortness of breath abdominal pain vomiting.  Does not use a walker or a cane.  The history is provided by the patient.  Fall This is a new problem. The current episode started less than 1 hour ago. The problem has not changed since onset.Associated symptoms include headaches. Pertinent negatives include no chest pain, no abdominal pain and no shortness of breath. Associated symptoms comments: Right elbow left knee right knee face pain. The symptoms are aggravated by bending and twisting. Nothing relieves the symptoms. She has tried nothing for the symptoms. The treatment provided no relief.      Past Medical History:  Diagnosis Date   AAA (abdominal aortic aneurysm)    Osteoporosis    Raynaud's disease    Scoliosis     Patient Active Problem List   Diagnosis Date Noted   Family history of breast cancer 11/13/2018   Malnutrition of moderate degree (HCC) 04/09/2014   Aortic aneurysm, thoracic 04/04/2014   Acute bronchitis 03/31/2014   HTN (hypertension) 03/31/2014   Descending thoracic aortic dissection    UTI (lower urinary tract infection)     Past Surgical History:  Procedure Laterality Date   APPENDECTOMY     at age 70   BREAST BIOPSY Left    CHEST TUBE INSERTION Left 04/04/2014   Procedure: CHEST  TUBE INSERTION;  Surgeon: Nada Libman, MD;  Location: [facility] OR;  Service: Vascular;  Laterality: Left;  by Dr. Collier Bullock   INSERTION OF VENA CAVA FILTER N/A 04/02/2014   Procedure: INSERTION OF VENA CAVA FILTER;  Surgeon: Nada Libman, MD;  Location: [facility] CATH LAB;  Service: Cardiovascular;  Laterality: N/A;   PERIPHERAL VASCULAR CATHETERIZATION N/A 06/26/2014   Procedure: IVC Filter Removal;  Surgeon: Nada Libman, MD;  Location: [facility] INVASIVE CV LAB;  Service: Cardiovascular;  Laterality: N/A;   THORACIC AORTIC ENDOVASCULAR STENT GRAFT N/A 04/04/2014   Procedure: THORACIC AORTIC ENDOVASCULAR STENT GRAFT;  Surgeon: Nada Libman, MD;  Location: [facility] OR;  Service: Vascular;  Laterality: N/A;     OB History   No obstetric history on file.     Family History  Problem Relation Age of Onset   Breast cancer Daughter 52    Social History   Tobacco Use   Smoking status: Never   Smokeless tobacco: Never  Vaping Use   Vaping Use: Never used  Substance Use Topics   Alcohol use: No   Drug use: No    Home Medications Prior to Admission medications   Not on File    Allergies    Fentanyl, Asa [aspirin], Azithromycin, Fosamax [alendronate], and Labetalol hcl  Review of Systems   Review of Systems  Constitutional:  Negative for fever.  HENT:  Negative for sore throat.   Eyes:  Negative for visual disturbance.  Respiratory:  Negative for shortness of breath.   Cardiovascular:  Negative for chest pain.  Gastrointestinal:  Negative for abdominal pain.  Genitourinary:  Negative for dysuria.  Musculoskeletal:  Negative for neck pain.  Skin:  Positive for wound. Negative for rash.  Neurological:  Positive for headaches.   Physical Exam Updated Vital Signs BP (!) 192/77 (BP Location: Right Arm)   Pulse 64   Temp 98 F (36.7 C) (Oral)   Resp 16   Ht 5\' 2"  (1.575 m)   Wt 52.2 kg   SpO2 100%   BMI 21.03 kg/m   Physical Exam Vitals and nursing note reviewed.  Constitutional:       General: She is not in acute distress.    Appearance: Normal appearance. She is well-developed.  HENT:     Head: Normocephalic.     Comments: She has bruising of her chin and some abrasions around her mouth.  Small abrasion right temple. Eyes:     Conjunctiva/sclera: Conjunctivae normal.  Cardiovascular:     Rate and Rhythm: Normal rate and regular rhythm.     Heart sounds: No murmur heard. Pulmonary:     Effort: Pulmonary effort is normal. No respiratory distress.     Breath sounds: Normal breath sounds.  Abdominal:     Palpations: Abdomen is soft.     Tenderness: There is no abdominal tenderness.  Musculoskeletal:        General: Tenderness present. No deformity.     Cervical back: Neck supple.     Comments: Right upper extremity tender at elbow.  Full range of motion of shoulder elbow and wrist.  Left upper extremity full range of motion without any pain or limitations.  Bilateral hips nontender.  Both knees are mildly swollen ecchymotic and tender.  Left extensor mechanism out. ankles and feet nontender.  Distal neurovascular intact.  No cervical thoracic and lumbar tenderness  Skin:    General: Skin is warm and dry.  Neurological:     General: No focal deficit present.     Mental Status: She is alert and oriented to person, place, and time.     Sensory: No sensory deficit.     Motor: No weakness.    ED Results / Procedures / Treatments   Labs (all labs ordered are listed, but only abnormal results are displayed) Labs Reviewed - No data to display  EKG None  Radiology DG Elbow Complete Right  Result Date: 11/13/2020 CLINICAL DATA:  Fall. EXAM: RIGHT ELBOW - COMPLETE 3+ VIEW COMPARISON:  None. FINDINGS: There is no evidence of fracture, dislocation, or joint effusion. There is no evidence of arthropathy or other focal bone abnormality. Soft tissues are unremarkable. IMPRESSION: Negative. Electronically Signed   By: 02-06[birth year]1970 M.D.   On: 11/13/2020 15:30   CT Head Wo  Contrast  Result Date: 11/13/2020 CLINICAL DATA:  13/03/2020 in parking lot at [employer], tripped and fell striking head, abrasion RIGHT supraorbital EXAM: CT HEAD WITHOUT CONTRAST TECHNIQUE: Contiguous axial images were obtained from the base of the skull through the vertex without intravenous contrast. COMPARISON:  08/10/2012 FINDINGS: Brain: Generalized atrophy. Normal ventricular morphology. No midline shift or mass effect. Mild small vessel chronic ischemic changes of deep cerebral white matter. No intracranial hemorrhage, mass lesion, evidence of acute infarction, or extra-axial fluid collection. Vascular: No hyperdense vessels. Atherosclerotic calcification of internal carotid arteries at  skull base Skull: Demineralized but intact Sinuses/Orbits: Clear Other: N/A IMPRESSION: Atrophy with mild small vessel chronic ischemic changes of deep cerebral white matter. No acute intracranial abnormalities. Electronically Signed   By: Ulyses Southward M.D.   On: 11/13/2020 15:45   CT Cervical Spine Wo Contrast  Result Date: 11/13/2020 CLINICAL DATA:  [employer] in parking lot at [employer], tripped and fell striking head, abrasion RIGHT supraorbital EXAM: CT CERVICAL SPINE WITHOUT CONTRAST TECHNIQUE: Multidetector CT imaging of the cervical spine was performed without intravenous contrast. Multiplanar CT image reconstructions were also generated. COMPARISON:  None FINDINGS: Alignment: Normal Skull base and vertebrae: Diffuse osseous demineralization. Scattered mild disc space narrowing and minimal endplate spur formation. Mild scattered facet degenerative changes. Skull base intact. Significant pannus surrounding the odontoid process. Vertebral body heights maintained without fracture, subluxation, or bone destruction. Soft tissues and spinal canal: Prevertebral soft tissues normal thickness. Atherosclerotic calcifications of the carotid bifurcations, proximal great vessels, aortic arch. Disc levels:  No specific abnormalities  Upper chest: Minimal biapical scarring. Prior endoluminal stenting of aortic arch. Other: N/A IMPRESSION: Osseous demineralization with degenerative disc and facet disease changes of the cervical spine. No acute cervical spine abnormalities. Aortic Atherosclerosis (ICD10-I70.0). Electronically Signed   By: Ulyses Southward M.D.   On: 11/13/2020 15:53   DG Knee Complete 4 Views Left  Result Date: 11/13/2020 CLINICAL DATA:  Fall EXAM: LEFT KNEE - COMPLETE 4+ VIEW COMPARISON:  None. FINDINGS: Transverse fracture in the mid patella with mild displacement. Small joint effusion. No other fracture.  Joint spaces maintained. IMPRESSION: Mildly displaced fracture of the patella. Electronically Signed   By: Marlan Palau M.D.   On: 11/13/2020 15:30   DG Knee Complete 4 Views Right  Result Date: 11/13/2020 CLINICAL DATA:  Fall EXAM: RIGHT KNEE - COMPLETE 4+ VIEW COMPARISON:  None. FINDINGS: No evidence of fracture, dislocation, or joint effusion. No evidence of arthropathy or other focal bone abnormality. Soft tissues are unremarkable. IMPRESSION: Negative. Electronically Signed   By: Marlan Palau M.D.   On: 11/13/2020 15:31   CT Maxillofacial WO CM  Result Date: 11/13/2020 CLINICAL DATA:  [employer] in parking lot at [employer], tripped and fell striking head, abrasion RIGHT supraorbital EXAM: CT MAXILLOFACIAL WITHOUT CONTRAST TECHNIQUE: Multidetector CT imaging of the maxillofacial structures was performed. Multiplanar CT image reconstructions were also generated. Right side of face marked with BB. COMPARISON:  None FINDINGS: Osseous: TMJ alignment normal. Diffuse osseous demineralization. Beam hardening artifacts of dental origin. Mandible intact. Orbits, sinuses, and zygomas intact. No facial bone fractures identified. Orbits: Bony orbits intact. Intraorbital soft tissue planes clear without fluid or pneumatosis Sinuses: Paranasal sinuses, middle ear cavities, and visualized mastoid air cells clear Soft tissues: Mild  soft tissue swelling RIGHT supraorbital, RIGHT pre mandibular. Atherosclerotic calcification at the carotid bifurcations. Limited intracranial: Generalized atrophy IMPRESSION: Mild soft tissue swelling RIGHT supraorbital and RIGHT premandibular. No acute facial bone abnormalities. Electronically Signed   By: Ulyses Southward M.D.   On: 11/13/2020 15:49    Procedures Procedures   Medications Ordered in ED Medications - No data to display  ED Course  I have reviewed the triage vital signs and the nursing notes.  Pertinent labs & imaging results that were available during my care of the patient were reviewed by me and considered in my medical decision making (see chart for details).  Clinical Course as of 11/14/20 1051  Thu Nov 13, 2020  1608 Tetanus is up-to-date [MB]  1608 X-rays ordered interpreted by  me as left patellar fracture.  Otherwise no acute fractures. [MB]  1628 Discussed with PA covering Dr. Everardo Pacific who is asking patient to get a knee immobilizer and follow-up tomorrow in the office.  I reviewed this with the patient.  She is comfortable plan.  She has a walker at home that her daughter is bringing up. [MB]    Clinical Course User Index [MB] Terrilee Files, MD   MDM Rules/Calculators/A&P                          This patient complains of fall with injuries to face right elbow both knees; this involves an extensive number of treatment Options and is a complaint that carries with it a high risk of complications and Morbidity. The differential includes fracture, dislocation, contusion, intracranial bleed I ordered imaging studies which included CT head cervical spine max face along with x-rays of right knee left knee and right elbow and I independently    visualized and interpreted imaging which showed displaced patellar fracture, soft tissue swelling Additional history obtained from EMS Previous records obtained and reviewed in epic no recent admissions I consulted orthopedics  PA covering Dr. Everardo Pacific and discussed lab and imaging findings  After the interventions stated above, I reevaluated the patient and found patient be awake alert.  She is comfortable plan for knee immobilizer and outpatient follow-up with orthopedics tomorrow.  Reviewed my concern regarding her extensor mechanism being out and she may require surgery.  Return instructions discussed.  Patient's daughter coming to pick up patient  Final Clinical Impression(s) / ED Diagnoses Final diagnoses:  Closed sleeve fracture of left patella, initial encounter  Facial contusion, initial encounter  Contusion of right knee, initial encounter  Contusion of right elbow, initial encounter  Fall, initial encounter    Rx / DC Orders ED Discharge Orders     None        Terrilee Files, MD 11/14/20 1054

## 2020-11-13 NOTE — Discharge Instructions (Addendum)
You were seen in the emergency department for evaluation of injuries from a fall.  You had CAT scan of your head and face and neck along with x-rays of your right elbow and both knees.  You fractured your left patella and its possible that this may need surgery.  Orthopedics is recommending a knee immobilizer and following up with them tomorrow.  You should use ice to the affected areas and can use Tylenol as needed for pain.

## 2020-11-13 NOTE — ED Triage Notes (Signed)
Fell in parking lot [employer].  Arrived via EMS.  States tripped and fell hitting head, right elbow and left knee.  Denies LOC.  Noted abrasion side of right eye brow; lip and chin.  Abrasion to right elbow.  Full ROM. Abrasion and swelling to left knee. Limited ROM of left knee.  Abrasion to right knee

## 2020-11-14 ENCOUNTER — Encounter (HOSPITAL_BASED_OUTPATIENT_CLINIC_OR_DEPARTMENT_OTHER): Payer: Self-pay | Admitting: Orthopedic Surgery

## 2020-11-14 ENCOUNTER — Other Ambulatory Visit: Payer: Self-pay

## 2020-11-14 DIAGNOSIS — M25552 Pain in left hip: Secondary | ICD-10-CM | POA: Diagnosis not present

## 2020-11-14 NOTE — Progress Notes (Signed)
Patient's chart and recent imaging studies of chest reviewed with Dr Nance Pew, [place] for [facility].

## 2020-11-17 DIAGNOSIS — S82002D Unspecified fracture of left patella, subsequent encounter for closed fracture with routine healing: Secondary | ICD-10-CM | POA: Diagnosis not present

## 2020-11-17 DIAGNOSIS — M25562 Pain in left knee: Secondary | ICD-10-CM | POA: Diagnosis not present

## 2020-11-20 ENCOUNTER — Ambulatory Visit (HOSPITAL_COMMUNITY): Payer: Medicare Other

## 2020-11-20 ENCOUNTER — Ambulatory Visit (HOSPITAL_COMMUNITY)
Admission: RE | Admit: 2020-11-20 | Discharge: 2020-11-20 | Disposition: A | Discharge status: Home / Self Care | Payer: Medicare Other | Attending: Orthopedic Surgery | Admitting: Orthopedic Surgery

## 2020-11-20 ENCOUNTER — Other Ambulatory Visit: Payer: Self-pay

## 2020-11-20 ENCOUNTER — Other Ambulatory Visit (HOSPITAL_COMMUNITY): Payer: Self-pay | Admitting: Orthopedic Surgery

## 2020-11-20 ENCOUNTER — Ambulatory Visit (HOSPITAL_BASED_OUTPATIENT_CLINIC_OR_DEPARTMENT_OTHER): Payer: Medicare Other | Admitting: Anesthesiology

## 2020-11-20 ENCOUNTER — Encounter (HOSPITAL_BASED_OUTPATIENT_CLINIC_OR_DEPARTMENT_OTHER): Payer: Self-pay | Admitting: Orthopedic Surgery

## 2020-11-20 ENCOUNTER — Encounter (HOSPITAL_BASED_OUTPATIENT_CLINIC_OR_DEPARTMENT_OTHER)
Admission: RE | Disposition: A | Discharge status: Home / Self Care | Payer: Self-pay | Source: Home / Self Care | Attending: Orthopedic Surgery

## 2020-11-20 DIAGNOSIS — I10 Essential (primary) hypertension: Secondary | ICD-10-CM | POA: Diagnosis not present

## 2020-11-20 DIAGNOSIS — R52 Pain, unspecified: Secondary | ICD-10-CM

## 2020-11-20 DIAGNOSIS — Y92481 Parking lot as the place of occurrence of the external cause: Secondary | ICD-10-CM | POA: Diagnosis not present

## 2020-11-20 DIAGNOSIS — W19XXXA Unspecified fall, initial encounter: Secondary | ICD-10-CM | POA: Insufficient documentation

## 2020-11-20 DIAGNOSIS — S82002D Unspecified fracture of left patella, subsequent encounter for closed fracture with routine healing: Secondary | ICD-10-CM | POA: Diagnosis not present

## 2020-11-20 DIAGNOSIS — M25462 Effusion, left knee: Secondary | ICD-10-CM | POA: Diagnosis not present

## 2020-11-20 DIAGNOSIS — S82002A Unspecified fracture of left patella, initial encounter for closed fracture: Secondary | ICD-10-CM | POA: Insufficient documentation

## 2020-11-20 DIAGNOSIS — N390 Urinary tract infection, site not specified: Secondary | ICD-10-CM | POA: Diagnosis not present

## 2020-11-20 DIAGNOSIS — Z419 Encounter for procedure for purposes other than remedying health state, unspecified: Secondary | ICD-10-CM

## 2020-11-20 DIAGNOSIS — Z9181 History of falling: Secondary | ICD-10-CM | POA: Insufficient documentation

## 2020-11-20 DIAGNOSIS — G8918 Other acute postprocedural pain: Secondary | ICD-10-CM | POA: Diagnosis not present

## 2020-11-20 HISTORY — DX: Essential (primary) hypertension: I10

## 2020-11-20 HISTORY — PX: ORIF PATELLA: SHX5033

## 2020-11-20 HISTORY — DX: Unspecified osteoarthritis, unspecified site: M19.90

## 2020-11-20 HISTORY — DX: Unspecified fracture of unspecified patella, initial encounter for closed fracture: S82.009A

## 2020-11-20 LAB — POCT HEMOGLOBIN-HEMACUE: Hemoglobin: 10.2 g/dL — ABNORMAL LOW (ref 12.0–15.0)

## 2020-11-20 SURGERY — OPEN REDUCTION INTERNAL FIXATION (ORIF) PATELLA
Anesthesia: General | Site: Knee | Laterality: Left

## 2020-11-20 MED ORDER — ONDANSETRON HCL 4 MG/2ML IJ SOLN
INTRAMUSCULAR | Status: DC | PRN
  Administered 2020-11-20: 4 mg via INTRAVENOUS

## 2020-11-20 MED ORDER — FENTANYL CITRATE (PF) 100 MCG/2ML IJ SOLN
INTRAMUSCULAR | Status: DC | PRN
  Administered 2020-11-20 (×2): 25 ug via INTRAVENOUS

## 2020-11-20 MED ORDER — PROPOFOL 10 MG/ML IV BOLUS
INTRAVENOUS | Status: DC | PRN
  Administered 2020-11-20: 120 mg via INTRAVENOUS

## 2020-11-20 MED ORDER — FENTANYL CITRATE (PF) 100 MCG/2ML IJ SOLN
INTRAMUSCULAR | Status: AC
  Filled 2020-11-20: qty 2

## 2020-11-20 MED ORDER — ONDANSETRON HCL 4 MG/2ML IJ SOLN: 4.0000 mg | Freq: Once | INTRAMUSCULAR | Status: DC | PRN

## 2020-11-20 MED ORDER — DEXAMETHASONE SODIUM PHOSPHATE 10 MG/ML IJ SOLN
INTRAMUSCULAR | Status: DC | PRN
  Administered 2020-11-20: 4 mg via INTRAVENOUS

## 2020-11-20 MED ORDER — ACETAMINOPHEN 500 MG PO TABS: 1000.0000 mg | ORAL_TABLET | Freq: Three times a day (TID) | ORAL | 0 refills | Status: AC | PRN

## 2020-11-20 MED ORDER — OXYCODONE HCL 5 MG/5ML PO SOLN: 2.5000 mg | Freq: Once | ORAL | Status: DC | PRN

## 2020-11-20 MED ORDER — BUPIVACAINE HCL (PF) 0.5 % IJ SOLN
INTRAMUSCULAR | Status: DC | PRN
  Administered 2020-11-20: 20 mL via PERINEURAL

## 2020-11-20 MED ORDER — CEFAZOLIN SODIUM-DEXTROSE 2-4 GM/100ML-% IV SOLN
2.0000 g | INTRAVENOUS | Status: AC
  Administered 2020-11-20: 2 g via INTRAVENOUS

## 2020-11-20 MED ORDER — ACETAMINOPHEN 10 MG/ML IV SOLN
1000.0000 mg | Freq: Once | INTRAVENOUS | Status: DC | PRN
  Administered 2020-11-20: 1000 mg via INTRAVENOUS

## 2020-11-20 MED ORDER — FENTANYL CITRATE (PF) 100 MCG/2ML IJ SOLN
25.0000 ug | Freq: Once | INTRAMUSCULAR | Status: AC
  Administered 2020-11-20: 25 ug via INTRAVENOUS

## 2020-11-20 MED ORDER — APIXABAN 2.5 MG PO TABS: 2.5000 mg | ORAL_TABLET | Freq: Two times a day (BID) | ORAL | 0 refills | Status: DC

## 2020-11-20 MED ORDER — ACETAMINOPHEN 500 MG PO TABS: 1000.0000 mg | ORAL_TABLET | Freq: Once | ORAL | Status: DC

## 2020-11-20 MED ORDER — OXYCODONE HCL 5 MG PO TABS: 2.5000 mg | ORAL_TABLET | Freq: Once | ORAL | Status: DC | PRN

## 2020-11-20 MED ORDER — MIDAZOLAM HCL 2 MG/2ML IJ SOLN
INTRAMUSCULAR | Status: AC
  Filled 2020-11-20: qty 2

## 2020-11-20 MED ORDER — CEFAZOLIN SODIUM-DEXTROSE 2-4 GM/100ML-% IV SOLN
INTRAVENOUS | Status: AC
  Filled 2020-11-20: qty 100

## 2020-11-20 MED ORDER — HYDROCODONE-ACETAMINOPHEN 5-325 MG PO TABS: 0.5000 | ORAL_TABLET | ORAL | 0 refills | Status: AC | PRN

## 2020-11-20 MED ORDER — LACTATED RINGERS IV SOLN: INTRAVENOUS | Status: DC

## 2020-11-20 MED ORDER — FENTANYL CITRATE (PF) 100 MCG/2ML IJ SOLN
25.0000 ug | INTRAMUSCULAR | Status: DC | PRN
  Administered 2020-11-20 (×3): 25 ug via INTRAVENOUS

## 2020-11-20 MED ORDER — ACETAMINOPHEN 10 MG/ML IV SOLN
INTRAVENOUS | Status: AC
  Filled 2020-11-20: qty 100

## 2020-11-20 MED ORDER — PHENYLEPHRINE HCL (PRESSORS) 10 MG/ML IV SOLN
INTRAVENOUS | Status: DC | PRN
  Administered 2020-11-20 (×2): 80 ug via INTRAVENOUS

## 2020-11-20 SURGICAL SUPPLY — 67 items
APL PRP STRL LF DISP 70% ISPRP (MISCELLANEOUS) ×1
BANDAGE ESMARK 6X9 LF (GAUZE/BANDAGES/DRESSINGS) ×1 IMPLANT
BIT DRILL 2.6 CANN (BIT) ×2 IMPLANT
BLADE SURG 15 STRL LF DISP TIS (BLADE) ×2 IMPLANT
BLADE SURG 15 STRL SS (BLADE) ×4
BNDG CMPR 9X6 STRL LF SNTH (GAUZE/BANDAGES/DRESSINGS) ×1
BNDG COHESIVE 4X5 TAN ST LF (GAUZE/BANDAGES/DRESSINGS) ×2 IMPLANT
BNDG ELASTIC 4X5.8 VLCR STR LF (GAUZE/BANDAGES/DRESSINGS) ×2 IMPLANT
BNDG ELASTIC 6X5.8 VLCR STR LF (GAUZE/BANDAGES/DRESSINGS) ×2 IMPLANT
BNDG ESMARK 6X9 LF (GAUZE/BANDAGES/DRESSINGS) ×2
CHLORAPREP W/TINT 26 (MISCELLANEOUS) ×2 IMPLANT
COVER BACK TABLE 60X90IN (DRAPES) ×2 IMPLANT
CUFF TOURN SGL QUICK 24 (TOURNIQUET CUFF) ×2
CUFF TOURN SGL QUICK 34 (TOURNIQUET CUFF)
CUFF TRNQT CYL 24X4X16.5-23 (TOURNIQUET CUFF) ×1 IMPLANT
CUFF TRNQT CYL 34X4.125X (TOURNIQUET CUFF) IMPLANT
DECANTER SPIKE VIAL GLASS SM (MISCELLANEOUS) IMPLANT
DRAPE C-ARM 35X43 STRL (DRAPES) ×2 IMPLANT
DRAPE C-ARMOR (DRAPES) ×2 IMPLANT
DRAPE EXTREMITY T 121X128X90 (DISPOSABLE) ×2 IMPLANT
DRAPE IMP U-DRAPE 54X76 (DRAPES) ×2 IMPLANT
DRAPE U-SHAPE 47X51 STRL (DRAPES) ×2 IMPLANT
DRSG ADAPTIC 3X8 NADH LF (GAUZE/BANDAGES/DRESSINGS) ×2 IMPLANT
DRSG PAD ABDOMINAL 8X10 ST (GAUZE/BANDAGES/DRESSINGS) ×2 IMPLANT
ELECT REM PT RETURN 9FT ADLT (ELECTROSURGICAL) ×2
ELECTRODE REM PT RTRN 9FT ADLT (ELECTROSURGICAL) ×1 IMPLANT
FIBERTAPE 2 W/STRL NDL 17 (SUTURE) ×2 IMPLANT
GAUZE SPONGE 4X4 12PLY STRL (GAUZE/BANDAGES/DRESSINGS) ×2 IMPLANT
GLOVE SRG 8 PF TXTR STRL LF DI (GLOVE) ×1 IMPLANT
GLOVE SURG ENC MOIS LTX SZ8 (GLOVE) ×2 IMPLANT
GLOVE SURG ENC MOIS LTX SZ8.5 (GLOVE) ×2 IMPLANT
GLOVE SURG ORTHO LTX SZ8.5 (GLOVE) ×2 IMPLANT
GLOVE SURG UNDER POLY LF SZ7 (GLOVE) ×4 IMPLANT
GLOVE SURG UNDER POLY LF SZ8 (GLOVE) ×2
GOWN STRL REUS W/ TWL LRG LVL3 (GOWN DISPOSABLE) ×1 IMPLANT
GOWN STRL REUS W/TWL LRG LVL3 (GOWN DISPOSABLE) ×2
GOWN STRL REUS W/TWL XL LVL3 (GOWN DISPOSABLE) ×2 IMPLANT
GUIDEWIRE 1.35MM  DUAL TROCAR (WIRE) ×2
GUIDEWIRE 1.35MM DUAL TROCAR (WIRE) ×2 IMPLANT
NEEDLE HYPO 22GX1.5 SAFETY (NEEDLE) IMPLANT
NS IRRIG 1000ML POUR BTL (IV SOLUTION) ×2 IMPLANT
PACK BASIN DAY SURGERY FS (CUSTOM PROCEDURE TRAY) ×2 IMPLANT
PAD CAST 4YDX4 CTTN HI CHSV (CAST SUPPLIES) ×1 IMPLANT
PADDING CAST ABS 4INX4YD NS (CAST SUPPLIES) ×2
PADDING CAST ABS COTTON 4X4 ST (CAST SUPPLIES) ×2 IMPLANT
PADDING CAST COTTON 4X4 STRL (CAST SUPPLIES) ×2
PADDING CAST COTTON 6X4 STRL (CAST SUPPLIES) ×4 IMPLANT
PENCIL SMOKE EVACUATOR (MISCELLANEOUS) ×2 IMPLANT
SCREW CANN BLUNT TIP 4X50 LP (Screw) ×2 IMPLANT
SCREW CANN BLUNT TIP LP 4X42 (Screw) ×2 IMPLANT
SCREW CANN LP BT 4X44 (Screw) ×2 IMPLANT
SLEEVE SCD COMPRESS KNEE MED (STOCKING) ×2 IMPLANT
SPLINT FAST PLASTER 5X30 (CAST SUPPLIES)
SPLINT PLASTER CAST FAST 5X30 (CAST SUPPLIES) IMPLANT
SPONGE T-LAP 18X18 ~~LOC~~+RFID (SPONGE) ×2 IMPLANT
SUCTION FRAZIER HANDLE 10FR (MISCELLANEOUS) ×1
SUCTION TUBE FRAZIER 10FR DISP (MISCELLANEOUS) ×1 IMPLANT
SUT ETHILON 3 0 PS 1 (SUTURE) ×4 IMPLANT
SUT VIC AB 0 CT1 27 (SUTURE) ×4
SUT VIC AB 0 CT1 27XBRD ANBCTR (SUTURE) ×2 IMPLANT
SUT VIC AB 2-0 SH 27 (SUTURE) ×4
SUT VIC AB 2-0 SH 27XBRD (SUTURE) ×2 IMPLANT
SYR BULB EAR ULCER 3OZ GRN STR (SYRINGE) ×2 IMPLANT
SYR CONTROL 10ML LL (SYRINGE) IMPLANT
TOWEL GREEN STERILE FF (TOWEL DISPOSABLE) ×4 IMPLANT
TUBE CONNECTING 20X1/4 (TUBING) ×2 IMPLANT
UNDERPAD 30X36 HEAVY ABSORB (UNDERPADS AND DIAPERS) ×2 IMPLANT

## 2020-11-20 NOTE — Op Note (Signed)
11/20/2020  12:35 PM  PATIENT:  Stephanie Collier    PRE-OPERATIVE DIAGNOSIS:  LEFT PATELLA FRACTURE  POST-OPERATIVE DIAGNOSIS:  Same  PROCEDURE:  OPEN REDUCTION INTERNAL (ORIF) FIXATION PATELLA  SURGEON:  Shron Ozer A Soul Deveney, MD  PHYSICIAN ASSISTANT: none  ANESTHESIA:   General  PREOPERATIVE INDICATIONS:  Stephanie Collier is a  85 y.o. female with a diagnosis of LEFT PATELLA FRACTURE.  Given the fracture displacement and disruption of the extensor mechanism open reduction internal fixation was indicated.  Given her comorbidities including a abdominal aneurysm and history of DVT, PE I did discuss postoperative DVT prophylaxis with her vascular surgeon who felt she is safe for chemoprophylaxis at this time.  We will plan to use low-dose Eliquis postoperatively.  The risks benefits and alternatives were discussed with the patient preoperatively including but not limited to the risks of infection, bleeding, nerve injury, cardiopulmonary complications, the need for revision surgery, among others, and the patient was willing to proceed.  ESTIMATED BLOOD LOSS: 25cc  OPERATIVE IMPLANTS:  Implant Name Type Inv. Item Serial No. Manufacturer Lot No. LRB No. Used Action  SCREW CANN BLUNT TIP LP 4X42 - [id number] Screw SCREW CANN BLUNT TIP LP 4X42  [employer]  Left 1 Implanted  SCREW CANN LP BT 4X44 - [id number] Screw SCREW CANN LP BT 4X44  [employer]  Left 1 Implanted     OPERATIVE FINDINGS: Successful open reduction and internal fixation of left transverse patella fracture with partially-threaded cannulated bone screws and fiber tape tension band  OPERATIVE PROCEDURE:  The patient was brought to the operating room.  She had a preoperative adductor canal block.  General anesthesia was induced.  The left leg was prepped and draped in a sterile fashion.  2 g of Ancef were given.  Nonsterile tourniquet was inflated to 250 mmHg.  We made a medial parapatellar incision dissecting sharply through the skin  overlying the patella.  We encountered the fascia overlying patella and the retinaculum and immediately noted fracture as well as tears in the medial retinaculum and obvious hematoma.  Carried our dissection subcutaneously flaps exposing the retinacular tears achieving hemostasis as we progressed.  The fracture was a simple transverse fracture with some dorsal comminution.  We were able to excisionally debride hematoma from the fracture site and clear soft tissue to this facilitate and reduction.  At this point a tenaculum clamps was used to hold anatomic reduction which was checked on fluoroscopy.  We prioritized the articular reduction.  We were able to palpate the articular surface to verify that it was indeed anatomically reduced through the retinacular tears.  This point we used fluoroscopy to place 2 K wires from the [employer] patella fracture set checking position on orthogonal fluoroscopy.  Were happy with her position and proceeded to place 2 partially threaded screws that were appropriately measured.  We again checked our reduction and screw length and placement using fluoroscopy.  Were happy with our compression at the fracture site.  This point we used fiber tape and placed in a tension band fashion through the screws and tied this down with good tension on her sutures.    At this point the incision was thoroughly irrigated and retinacular repair was performed with interrupted 0 vicryl.  Skin was closed with subcutaneous 2-0 Vicryl and 3-0 interrupted nylon mattress sutures for the skin.  A sterile dressing with Adaptic, 4 x 4 gauze, ABD, and an Ace wrap were applied.  Patient was awoken from anesthesia and brought to the  PACU in stable condition.  Post op recs: WB: WBAT LLE in KI at all times. Dressing: keep intact until follow up, change PRN if soiled or saturated. DVT prophylaxis: eliquis 2.5BID starting POD1 x4 weeks Follow up: 2 weeks after surgery for a wound check with Dr. Blanchie Dessert  at [facility].  Address: [address], [place], [place] [address]  Office Phone: [phone]  Weber Cooks, MD Orthopaedic Surgery

## 2020-11-20 NOTE — Transfer of Care (Signed)
Immediate Anesthesia Transfer of Care Note  Patient: Adalene Gulotta  Procedure(s) Performed: OPEN REDUCTION INTERNAL (ORIF) FIXATION PATELLA (Left: Knee)  Patient Location: PACU  Anesthesia Type:General  Level of Consciousness: sedated  Airway & Oxygen Therapy: Patient Spontanous Breathing and Patient connected to face mask oxygen  Post-op Assessment: Report given to RN and Post -op Vital signs reviewed and stable  Post vital signs: Reviewed and stable  Last Vitals:  Vitals Value Taken Time  BP 147/67 11/20/20 1225  Temp    Pulse 61 11/20/20 1229  Resp 14 11/20/20 1229  SpO2 100 % 11/20/20 1229  Vitals shown include unvalidated device data.  Last Pain:  Vitals:   11/20/20 1018  TempSrc: Oral  PainSc: 5          Complications: No notable events documented.

## 2020-11-20 NOTE — H&P (Signed)
SUBJECTIVE:   She is an 85 year old female who was referred to me by my partner, Dr. Everardo Pacific for evaluation and treatment of left patella fracture.  She reports a fall in the supermarket parking lot on 11/13/2020, landing on her left knee.  She also injured her face and elbow during the fall.  She had no loss of consciousness.  She had significant pain and swelling about the left knee and x-rays in the emergency room demonstrated a transverse displaced patella fracture.  She was placed in a knee immobilizer and has been weightbearing as tolerated with the use of a walker and the knee immobilizer.  She reports baseline difficulties with balance and reports additional fall on 11/15/2020, but states she fell to her right side and did not land on the left knee.  There are no new injuries to the left knee.  She denies significant new pain in any other joints or extremities.  She denies distal numbness and tingling in the arms or legs.   Past medical history:  Thoracic aortic aneurysm status post vascular repair.  History of DVT, as well as PE, osteoporosis, scoliosis.   OBJECTIVE: She has a moderate effusion of the left knee.  She has a small superficial abrasion to the anterolateral aspect of the knee, but without any erythema or drainage.  Knee range of motion deferred given the known patella fracture.  She has moderate ecchymosis over the lower leg as well.  She is nontender to palpation about the lower leg.  Only tenderness to palpation diffusely about the knee.  She has intact active ankle dorsiflexion and plantarflexion.  She has normal sensation to light touch below both knees.  Feet are warm and well perfused.    X-RAYS: X-rays of the left knee from the ER demonstrate displaced transverse left patellar fracture.    IMPRESSION:  Left knee displaced patella fracture.    PLAN: Reviewing the imaging findings with the patient and her family, I discussed that she had a displaced patella fracture that  without fixation likely would not heal and would result in poor mobility given disruption of her extensor mechanism.  I recommended open reduction and internal fixation either with screws and tension band or a plate and screws on backup, depending on her bone quality and ability to obtain adequate fixation.  The risks of surgery including infection, prominent hardware, painful hardware, blood clot, post-traumatic arthritis were discussed with the patient.  Given her history of aneurysm, as well as DVT and PE, I sent a message to Dr. Myra Gianotti who said she is okay for chemoprophylaxis, and no need for a IVC filter prior to surgery. Will plan to anticoagulate with eliquis 2.5mg  x4 weeks postoperatively.

## 2020-11-20 NOTE — Anesthesia Preprocedure Evaluation (Addendum)
Anesthesia Evaluation  Patient identified by MRN, date of birth, ID band Patient awake    Reviewed: Allergy & Precautions, NPO status , Patient's Chart, lab work & pertinent test results  Airway Mallampati: II  TM Distance: >3 FB Neck ROM: Full    Dental no notable dental hx.    Pulmonary neg pulmonary ROS,    Pulmonary exam normal breath sounds clear to auscultation       Cardiovascular METS: 3 - Mets hypertension, Normal cardiovascular exam Rhythm:Regular Rate:Normal  H/o AAA s/p repair in 2016   Neuro/Psych negative neurological ROS  negative psych ROS   GI/Hepatic negative GI ROS, Neg liver ROS,   Endo/Other  Raynaud's  Renal/GU negative Renal ROS  negative genitourinary   Musculoskeletal  (+) Arthritis , Osteoarthritis,    Abdominal   Peds negative pediatric ROS (+)  Hematology negative hematology ROS (+)   Anesthesia Other Findings Left patellar fracture Diffuse bruising over body but primarily left leg/hip; right knee, and face Blue fingertips from raynaud's right worse than left  Reproductive/Obstetrics                          Anesthesia Physical Anesthesia Plan  ASA: 3  Anesthesia Plan: General and Regional   Post-op Pain Management:  Regional for Post-op pain   Induction: Intravenous  PONV Risk Score and Plan: 3  Airway Management Planned: LMA  Additional Equipment: None  Intra-op Plan:   Post-operative Plan: Extubation in OR  Informed Consent: I have reviewed the patients History and Physical, chart, labs and discussed the procedure including the risks, benefits and alternatives for the proposed anesthesia with the patient or authorized representative who has indicated his/her understanding and acceptance.     Dental advisory given  Plan Discussed with: Anesthesiologist and CRNA  Anesthesia Plan Comments: (Adductor canal block for postop pain control. GA/LMA.  Patient has not had an EKG since 2016. Will get one today. Will also check an Hgb. Discussed with patient and family. Patient received fentanyl in the ED while her aortic dissection was occurring in 2016. We discussed how the shortness of breath could be due to the actual dissection and not the fentanyl she received for pain. We agreed that it would be OK for Mrs. Golinski to receive fentanyl in small doses and monitored carefully. Tanna Furry, MD  )       Anesthesia Quick Evaluation

## 2020-11-20 NOTE — Anesthesia Procedure Notes (Signed)
Anesthesia Regional Block: Adductor canal block   Pre-Anesthetic Checklist: , timeout performed,  Correct Patient, Correct Site, Correct Laterality,  Correct Procedure, Correct Position, site marked,  Risks and benefits discussed,  Surgical consent,  Pre-op evaluation,  At surgeon's request and post-op pain management  Laterality: Left  Prep: chloraprep       Needles:  Injection technique: Single-shot  Needle Type: Echogenic Stimulator Needle     Needle Length: 10cm  Needle Gauge: 20     Additional Needles:   Procedures:,,,, ultrasound used (permanent image in chart),,    Narrative:  Start time: 11/20/2020 10:25 AM End time: 11/20/2020 10:30 AM Injection made incrementally with aspirations every 5 mL.  Performed by: Personally  Anesthesiologist: Mellody Dance, MD  Additional Notes: Functioning IV was confirmed and monitors were applied.  Sterile prep and drape,hand hygiene and sterile gloves were used. Ultrasound guidance: relevant anatomy identified, needle position confirmed, local anesthetic spread visualized around nerve(s)., vascular puncture avoided. Negative aspiration and negative test dose prior to incremental administration of local anesthetic. The patient tolerated the procedure well.

## 2020-11-20 NOTE — Anesthesia Procedure Notes (Signed)
Procedure Name: LMA Insertion Date/Time: 11/20/2020 10:56 AM Performed by: Burna Cash, CRNA Pre-anesthesia Checklist: Patient identified, Emergency Drugs available, Suction available and Patient being monitored Patient Re-evaluated:Patient Re-evaluated prior to induction Oxygen Delivery Method: Circle system utilized Preoxygenation: Pre-oxygenation with 100% oxygen Induction Type: IV induction Ventilation: Mask ventilation without difficulty LMA: LMA inserted LMA Size: 4.0 Number of attempts: 1 Airway Equipment and Method: Bite block Placement Confirmation: positive ETCO2 Tube secured with: Tape Dental Injury: Teeth and Oropharynx as per pre-operative assessment

## 2020-11-20 NOTE — Discharge Instructions (Addendum)
Diet: As you were doing prior to hospitalization   Shower:  Do not get the left leg wet until follow up. Keep your knee immobilizer and dressing clean and dry.  Dressing: If the dressing remains clean and dry it can also be left on until follow up. If you change the dressing replace with clean gauze and an ace wrap.    Activity:  Increase activity slowly as tolerated, but follow the weight bearing instructions below.  The rules on driving is that you can not be taking narcotics while you drive, and you must feel in control of the vehicle.    Weight Bearing:   Weight bearing as tolerated on the left leg with the knee immobilizer. No bending of the knee for 4 weeks.    Pain Medications: You may take tylenol and/or hydrocodone as needed for pain. Use the hydrocodone only for breakthrough pain. Hydrocodone medication also contains tylenol. Make sure your total amount of tylenol in a 24 hour period does not exceed 3000mg .  Blood clot prevention (DVT Prophylaxis): After surgery you are at an increased risk for a blood clot. you were prescribed a blood thinner, Eliquis 2.5mg , starting the day after surgery to be taken twice daily for a total of 4 weeks from surgery to help reduce your risk of getting a blood clot. This will help prevent a blood clot. Signs of a pulmonary embolus (blood clot in the lungs) include sudden short of breath, feeling lightheaded or dizzy, chest pain with a deep breath, rapid pulse rapid breathing. Signs of a blood clot in your arms or legs include new unexplained swelling and cramping, warm, red or darkened skin around the painful area. Please call the office or 911 right away if these signs or symptoms develop. To prevent constipation: you may use a stool softener such as -  Colace (over the counter) 100 mg by mouth twice a day  Drink plenty of fluids (prune juice may be helpful) and high fiber foods Miralax (over the counter) for constipation as needed.    Itching:  If you  experience itching with your medications, try taking only a single pain pill, or even half a pain pill at a time.  You may take up to 10 pain pills per day, and you can also use benadryl over the counter for itching or also to help with sleep.   Precautions:  If you experience chest pain or shortness of breath - call 911 immediately for transfer to the hospital emergency department!!   Call office [phone]) for the following: Temperature greater than 101F Persistent nausea and vomiting Severe uncontrolled pain Redness, tenderness, or signs of infection (pain, swelling, redness, odor or green/yellow discharge around the site) Difficulty breathing, headache or visual disturbances Hives Persistent dizziness or light-headedness Extreme fatigue Any other questions or concerns you may have after discharge  In an emergency, call 911 or go to an Emergency Department at a nearby hospital                                                Follow- Up Appointment:  Please call for an appointment to be seen in 2 weeks [facility] with your surgeon [facility] - [phone]     Post Anesthesia Home Care Instructions  Activity: Get plenty of rest for the remainder of the day. A responsible individual must  stay with you for 24 hours following the procedure.  For the next 24 hours, DO NOT: -Drive a car -Advertising copywriter -Drink alcoholic beverages -Take any medication unless instructed by your physician -Make any legal decisions or sign important papers.  Meals: Start with liquid foods such as gelatin or soup. Progress to regular foods as tolerated. Avoid greasy, spicy, heavy foods. If nausea and/or vomiting occur, drink only clear liquids until the nausea and/or vomiting subsides. Call your physician if vomiting continues.  Special Instructions/Symptoms: Your throat may feel dry or sore from the anesthesia or the breathing tube placed in your throat during surgery. If this causes  discomfort, gargle with warm salt water. The discomfort should disappear within 24 hours.  If you had a scopolamine patch placed behind your ear for the management of post- operative nausea and/or vomiting:  1. The medication in the patch is effective for 72 hours, after which it should be removed.  Wrap patch in a tissue and discard in the trash. Wash hands thoroughly with soap and water. 2. You may remove the patch earlier than 72 hours if you experience unpleasant side effects which may include dry mouth, dizziness or visual disturbances. 3. Avoid touching the patch. Wash your hands with soap and water after contact with the patch.    Regional Anesthesia Blocks  1. Numbness or the inability to move the "blocked" extremity may last from 3-48 hours after placement. The length of time depends on the medication injected and your individual response to the medication. If the numbness is not going away after 48 hours, call your surgeon.  2. The extremity that is blocked will need to be protected until the numbness is gone and the  Strength has returned. Because you cannot feel it, you will need to take extra care to avoid injury. Because it may be weak, you may have difficulty moving it or using it. You may not know what position it is in without looking at it while the block is in effect.  3. For blocks in the legs and feet, returning to weight bearing and walking needs to be done carefully. You will need to wait until the numbness is entirely gone and the strength has returned. You should be able to move your leg and foot normally before you try and bear weight or walk. You will need someone to be with you when you first try to ensure you do not fall and possibly risk injury.  4. Bruising and tenderness at the needle site are common side effects and will resolve in a few days.  5. Persistent numbness or new problems with movement should be communicated to the surgeon or the [facility]  [phone] [facility] [phone]).

## 2020-11-21 ENCOUNTER — Encounter (HOSPITAL_BASED_OUTPATIENT_CLINIC_OR_DEPARTMENT_OTHER): Payer: Self-pay | Admitting: Orthopedic Surgery

## 2020-11-21 NOTE — Anesthesia Postprocedure Evaluation (Signed)
Anesthesia Post Note  Patient: Stephanie Collier  Procedure(s) Performed: OPEN REDUCTION INTERNAL (ORIF) FIXATION PATELLA (Left: Knee)     Patient location during evaluation: PACU Anesthesia Type: General Level of consciousness: sedated Pain management: pain level controlled Vital Signs Assessment: post-procedure vital signs reviewed and stable Respiratory status: spontaneous breathing and respiratory function stable Cardiovascular status: stable Postop Assessment: no apparent nausea or vomiting Anesthetic complications: no   No notable events documented.  Last Vitals:  Vitals:   11/20/20 1500 11/20/20 1622  BP: (!) 159/64 (!) 168/63  Pulse: 69   Resp: 15 16  Temp:  36.7 C  SpO2: 97% 96%    Last Pain:  Vitals:   11/21/20 1011  TempSrc:   PainSc: 5                  Candra R Tami Blass

## 2020-11-24 DIAGNOSIS — M25562 Pain in left knee: Secondary | ICD-10-CM | POA: Diagnosis not present

## 2020-11-24 DIAGNOSIS — S82002D Unspecified fracture of left patella, subsequent encounter for closed fracture with routine healing: Secondary | ICD-10-CM | POA: Diagnosis not present

## 2020-11-27 ENCOUNTER — Encounter (HOSPITAL_BASED_OUTPATIENT_CLINIC_OR_DEPARTMENT_OTHER): Payer: Self-pay | Admitting: Orthopedic Surgery

## 2020-11-27 MED ORDER — 0.9 % SODIUM CHLORIDE (POUR BTL) OPTIME
TOPICAL | Status: DC | PRN
  Administered 2020-11-27: 12:00:00 300 mL

## 2020-12-08 DIAGNOSIS — S82002D Unspecified fracture of left patella, subsequent encounter for closed fracture with routine healing: Secondary | ICD-10-CM | POA: Diagnosis not present

## 2020-12-08 DIAGNOSIS — M25562 Pain in left knee: Secondary | ICD-10-CM | POA: Diagnosis not present

## 2020-12-22 DIAGNOSIS — S82002D Unspecified fracture of left patella, subsequent encounter for closed fracture with routine healing: Secondary | ICD-10-CM | POA: Diagnosis not present

## 2020-12-31 DIAGNOSIS — R269 Unspecified abnormalities of gait and mobility: Secondary | ICD-10-CM | POA: Diagnosis not present

## 2020-12-31 DIAGNOSIS — S82002D Unspecified fracture of left patella, subsequent encounter for closed fracture with routine healing: Secondary | ICD-10-CM | POA: Diagnosis not present

## 2020-12-31 DIAGNOSIS — M6281 Muscle weakness (generalized): Secondary | ICD-10-CM | POA: Diagnosis not present

## 2021-01-06 DIAGNOSIS — M6281 Muscle weakness (generalized): Secondary | ICD-10-CM | POA: Diagnosis not present

## 2021-01-06 DIAGNOSIS — S82002D Unspecified fracture of left patella, subsequent encounter for closed fracture with routine healing: Secondary | ICD-10-CM | POA: Diagnosis not present

## 2021-01-06 DIAGNOSIS — R269 Unspecified abnormalities of gait and mobility: Secondary | ICD-10-CM | POA: Diagnosis not present

## 2021-01-07 DIAGNOSIS — S82009A Unspecified fracture of unspecified patella, initial encounter for closed fracture: Secondary | ICD-10-CM | POA: Diagnosis not present

## 2021-01-07 DIAGNOSIS — M79672 Pain in left foot: Secondary | ICD-10-CM | POA: Diagnosis not present

## 2021-01-07 DIAGNOSIS — R269 Unspecified abnormalities of gait and mobility: Secondary | ICD-10-CM | POA: Diagnosis not present

## 2021-01-26 DIAGNOSIS — S82002D Unspecified fracture of left patella, subsequent encounter for closed fracture with routine healing: Secondary | ICD-10-CM | POA: Diagnosis not present

## 2021-02-13 DIAGNOSIS — E559 Vitamin D deficiency, unspecified: Secondary | ICD-10-CM | POA: Diagnosis not present

## 2021-02-13 DIAGNOSIS — M810 Age-related osteoporosis without current pathological fracture: Secondary | ICD-10-CM | POA: Diagnosis not present

## 2021-02-13 DIAGNOSIS — R5383 Other fatigue: Secondary | ICD-10-CM | POA: Diagnosis not present

## 2021-02-13 DIAGNOSIS — Z780 Asymptomatic menopausal state: Secondary | ICD-10-CM | POA: Diagnosis not present

## 2021-02-24 DIAGNOSIS — M810 Age-related osteoporosis without current pathological fracture: Secondary | ICD-10-CM | POA: Diagnosis not present

## 2021-03-06 DIAGNOSIS — M25551 Pain in right hip: Secondary | ICD-10-CM | POA: Diagnosis not present

## 2021-03-06 DIAGNOSIS — M25552 Pain in left hip: Secondary | ICD-10-CM | POA: Diagnosis not present

## 2021-03-06 DIAGNOSIS — M25561 Pain in right knee: Secondary | ICD-10-CM | POA: Diagnosis not present

## 2021-03-27 DIAGNOSIS — M25562 Pain in left knee: Secondary | ICD-10-CM | POA: Diagnosis not present

## 2021-03-30 ENCOUNTER — Other Ambulatory Visit (HOSPITAL_COMMUNITY): Payer: Self-pay | Admitting: Sports Medicine

## 2021-03-30 ENCOUNTER — Other Ambulatory Visit: Payer: Self-pay | Admitting: Sports Medicine

## 2021-03-30 DIAGNOSIS — M25562 Pain in left knee: Secondary | ICD-10-CM

## 2021-04-04 ENCOUNTER — Encounter (HOSPITAL_COMMUNITY): Payer: Self-pay

## 2021-04-04 ENCOUNTER — Ambulatory Visit (HOSPITAL_COMMUNITY): Payer: Medicare Other

## 2021-06-03 ENCOUNTER — Encounter (HOSPITAL_COMMUNITY): Payer: Self-pay

## 2021-06-03 ENCOUNTER — Inpatient Hospital Stay (HOSPITAL_COMMUNITY)
Admission: EM | Admit: 2021-06-03 | Discharge: 2021-06-09 | DRG: 480 | Disposition: A | Discharge status: Skilled Nursing Facility | Payer: Medicare Other | Attending: Internal Medicine | Admitting: Internal Medicine

## 2021-06-03 ENCOUNTER — Emergency Department (HOSPITAL_COMMUNITY): Payer: Medicare Other

## 2021-06-03 ENCOUNTER — Other Ambulatory Visit: Payer: Self-pay

## 2021-06-03 DIAGNOSIS — Z888 Allergy status to other drugs, medicaments and biological substances status: Secondary | ICD-10-CM

## 2021-06-03 DIAGNOSIS — D62 Acute posthemorrhagic anemia: Secondary | ICD-10-CM | POA: Diagnosis not present

## 2021-06-03 DIAGNOSIS — S72145S Nondisplaced intertrochanteric fracture of left femur, sequela: Secondary | ICD-10-CM | POA: Diagnosis not present

## 2021-06-03 DIAGNOSIS — G8918 Other acute postprocedural pain: Secondary | ICD-10-CM | POA: Diagnosis not present

## 2021-06-03 DIAGNOSIS — I7300 Raynaud's syndrome without gangrene: Secondary | ICD-10-CM | POA: Diagnosis present

## 2021-06-03 DIAGNOSIS — Z86711 Personal history of pulmonary embolism: Secondary | ICD-10-CM

## 2021-06-03 DIAGNOSIS — Y92000 Kitchen of unspecified non-institutional (private) residence as  the place of occurrence of the external cause: Secondary | ICD-10-CM

## 2021-06-03 DIAGNOSIS — S72002A Fracture of unspecified part of neck of left femur, initial encounter for closed fracture: Secondary | ICD-10-CM | POA: Diagnosis not present

## 2021-06-03 DIAGNOSIS — E871 Hypo-osmolality and hyponatremia: Secondary | ICD-10-CM | POA: Diagnosis not present

## 2021-06-03 DIAGNOSIS — I7121 Aneurysm of the ascending aorta, without rupture: Secondary | ICD-10-CM

## 2021-06-03 DIAGNOSIS — E43 Unspecified severe protein-calorie malnutrition: Secondary | ICD-10-CM | POA: Diagnosis present

## 2021-06-03 DIAGNOSIS — I672 Cerebral atherosclerosis: Secondary | ICD-10-CM | POA: Diagnosis not present

## 2021-06-03 DIAGNOSIS — W19XXXA Unspecified fall, initial encounter: Secondary | ICD-10-CM

## 2021-06-03 DIAGNOSIS — R9431 Abnormal electrocardiogram [ECG] [EKG]: Secondary | ICD-10-CM | POA: Diagnosis not present

## 2021-06-03 DIAGNOSIS — S0990XA Unspecified injury of head, initial encounter: Secondary | ICD-10-CM | POA: Diagnosis not present

## 2021-06-03 DIAGNOSIS — S72145A Nondisplaced intertrochanteric fracture of left femur, initial encounter for closed fracture: Secondary | ICD-10-CM | POA: Diagnosis not present

## 2021-06-03 DIAGNOSIS — R011 Cardiac murmur, unspecified: Secondary | ICD-10-CM | POA: Diagnosis not present

## 2021-06-03 DIAGNOSIS — J449 Chronic obstructive pulmonary disease, unspecified: Secondary | ICD-10-CM | POA: Diagnosis present

## 2021-06-03 DIAGNOSIS — Z885 Allergy status to narcotic agent status: Secondary | ICD-10-CM | POA: Diagnosis not present

## 2021-06-03 DIAGNOSIS — I712 Thoracic aortic aneurysm, without rupture, unspecified: Secondary | ICD-10-CM | POA: Diagnosis not present

## 2021-06-03 DIAGNOSIS — I351 Nonrheumatic aortic (valve) insufficiency: Secondary | ICD-10-CM | POA: Diagnosis not present

## 2021-06-03 DIAGNOSIS — Z6821 Body mass index (BMI) 21.0-21.9, adult: Secondary | ICD-10-CM | POA: Diagnosis not present

## 2021-06-03 DIAGNOSIS — M25562 Pain in left knee: Secondary | ICD-10-CM | POA: Diagnosis not present

## 2021-06-03 DIAGNOSIS — D649 Anemia, unspecified: Secondary | ICD-10-CM | POA: Diagnosis not present

## 2021-06-03 DIAGNOSIS — S72142A Displaced intertrochanteric fracture of left femur, initial encounter for closed fracture: Secondary | ICD-10-CM | POA: Diagnosis not present

## 2021-06-03 DIAGNOSIS — I70209 Unspecified atherosclerosis of native arteries of extremities, unspecified extremity: Secondary | ICD-10-CM | POA: Diagnosis not present

## 2021-06-03 DIAGNOSIS — W010XXA Fall on same level from slipping, tripping and stumbling without subsequent striking against object, initial encounter: Secondary | ICD-10-CM | POA: Diagnosis present

## 2021-06-03 DIAGNOSIS — K5900 Constipation, unspecified: Secondary | ICD-10-CM | POA: Diagnosis not present

## 2021-06-03 DIAGNOSIS — S79912A Unspecified injury of left hip, initial encounter: Secondary | ICD-10-CM | POA: Diagnosis not present

## 2021-06-03 DIAGNOSIS — Z881 Allergy status to other antibiotic agents status: Secondary | ICD-10-CM

## 2021-06-03 DIAGNOSIS — Z01818 Encounter for other preprocedural examination: Secondary | ICD-10-CM | POA: Diagnosis not present

## 2021-06-03 DIAGNOSIS — M25552 Pain in left hip: Secondary | ICD-10-CM | POA: Diagnosis not present

## 2021-06-03 DIAGNOSIS — M80051A Age-related osteoporosis with current pathological fracture, right femur, initial encounter for fracture: Secondary | ICD-10-CM | POA: Diagnosis not present

## 2021-06-03 DIAGNOSIS — M80052A Age-related osteoporosis with current pathological fracture, left femur, initial encounter for fracture: Secondary | ICD-10-CM | POA: Diagnosis present

## 2021-06-03 DIAGNOSIS — Y92009 Unspecified place in unspecified non-institutional (private) residence as the place of occurrence of the external cause: Secondary | ICD-10-CM

## 2021-06-03 DIAGNOSIS — M7989 Other specified soft tissue disorders: Secondary | ICD-10-CM | POA: Diagnosis not present

## 2021-06-03 DIAGNOSIS — I723 Aneurysm of iliac artery: Secondary | ICD-10-CM | POA: Diagnosis present

## 2021-06-03 DIAGNOSIS — M1990 Unspecified osteoarthritis, unspecified site: Secondary | ICD-10-CM | POA: Diagnosis not present

## 2021-06-03 DIAGNOSIS — Z7901 Long term (current) use of anticoagulants: Secondary | ICD-10-CM

## 2021-06-03 DIAGNOSIS — I10 Essential (primary) hypertension: Secondary | ICD-10-CM | POA: Diagnosis present

## 2021-06-03 DIAGNOSIS — S72142D Displaced intertrochanteric fracture of left femur, subsequent encounter for closed fracture with routine healing: Secondary | ICD-10-CM | POA: Diagnosis not present

## 2021-06-03 DIAGNOSIS — I71012 Dissection of descending thoracic aorta: Secondary | ICD-10-CM | POA: Diagnosis not present

## 2021-06-03 LAB — CBC WITH DIFFERENTIAL/PLATELET
Abs Immature Granulocytes: 0.04 10*3/uL (ref 0.00–0.07)
Basophils Absolute: 0 10*3/uL (ref 0.0–0.1)
Basophils Relative: 0 %
Eosinophils Absolute: 0 10*3/uL (ref 0.0–0.5)
Eosinophils Relative: 0 %
HCT: 36.6 % (ref 36.0–46.0)
Hemoglobin: 11.6 g/dL — ABNORMAL LOW (ref 12.0–15.0)
Immature Granulocytes: 0 %
Lymphocytes Relative: 9 %
Lymphs Abs: 0.9 10*3/uL (ref 0.7–4.0)
MCH: 30.1 pg (ref 26.0–34.0)
MCHC: 31.7 g/dL (ref 30.0–36.0)
MCV: 95.1 fL (ref 80.0–100.0)
Monocytes Absolute: 0.5 10*3/uL (ref 0.1–1.0)
Monocytes Relative: 5 %
Neutro Abs: 8.7 10*3/uL — ABNORMAL HIGH (ref 1.7–7.7)
Neutrophils Relative %: 86 %
Platelets: 145 10*3/uL — ABNORMAL LOW (ref 150–400)
RBC: 3.85 MIL/uL — ABNORMAL LOW (ref 3.87–5.11)
RDW: 14.7 % (ref 11.5–15.5)
WBC: 10.2 10*3/uL (ref 4.0–10.5)
nRBC: 0 % (ref 0.0–0.2)

## 2021-06-03 LAB — BASIC METABOLIC PANEL
Anion gap: 7 (ref 5–15)
BUN: 15 mg/dL (ref 8–23)
CO2: 25 mmol/L (ref 22–32)
Calcium: 8.3 mg/dL — ABNORMAL LOW (ref 8.9–10.3)
Chloride: 103 mmol/L (ref 98–111)
Creatinine, Ser: 0.52 mg/dL (ref 0.44–1.00)
GFR, Estimated: >60 mL/min (ref >60)
Glucose, Bld: 113 mg/dL — ABNORMAL HIGH (ref 70–99)
Potassium: 3.8 mmol/L (ref 3.5–5.1)
Sodium: 135 mmol/L (ref 135–145)

## 2021-06-03 MED ORDER — MORPHINE SULFATE (PF) 2 MG/ML IV SOLN
2.0000 mg | Freq: Once | INTRAVENOUS | Status: AC
  Administered 2021-06-03: 2 mg via INTRAVENOUS
  Filled 2021-06-03: qty 1

## 2021-06-03 NOTE — Assessment & Plan Note (Signed)
-   will monitor on tele avoid QT prolonging medications, rehydrate correct electrolytes ? ?

## 2021-06-03 NOTE — ED Provider Notes (Incomplete)
[facility] EMERGENCY DEPARTMENT Provider Note   CSN: [id number] Arrival date & time: 06/03/21  2057     History {Add pertinent medical, surgical, social history, OB history to HPI:1} Chief Complaint  Patient presents with  . Fall    Stephanie Collier is a 86 y.o. female.  86 year old female with prior history of left patellar fracture 11/22, uses a walker for ambulation, lost her balance while in the kitchen at home, resulting in a fall where she landed on her left knee and hip, as well as hitting the back of her head on the stover. No obvious external head trauma. She denies loss of consciousness. She is on eliquis. Distal pulse, sensation and movement present. Some outward rotation of foot noted. Patient with tenderness in knee, groin, upper femur and hip on the left.  The history is provided by the patient and medical records. No language interpreter was used.  Fall This is a new problem. The current episode started 1 to 2 hours ago. Pertinent negatives include no chest pain, no abdominal pain, no headaches and no shortness of breath.  Hip Pain This is a new problem. The current episode started 1 to 2 hours ago. The problem has not changed since onset.Pertinent negatives include no chest pain, no abdominal pain, no headaches and no shortness of breath.      Home Medications Prior to Admission medications   Medication Sig Start Date End Date Taking? Authorizing Provider  apixaban (ELIQUIS) 2.5 MG TABS tablet Take 1 tablet (2.5 mg total) by mouth 2 (two) times daily. 11/20/20   Joen Laura, MD      Allergies    Fentanyl, Asa [aspirin], Azithromycin, Fosamax [alendronate], and Labetalol hcl    Review of Systems   Review of Systems  Respiratory:  Negative for shortness of breath.   Cardiovascular:  Negative for chest pain.  Gastrointestinal:  Negative for abdominal pain.  Musculoskeletal:  Positive for arthralgias. Negative for neck pain.  Neurological:   Negative for headaches.  All other systems reviewed and are negative.  Physical Exam Updated Vital Signs BP (!) 179/67   Pulse 65   Temp (!) 97.4 F (36.3 C) (Oral)   Resp 18   Ht 5' (1.524 m)   Wt 49.9 kg   SpO2 100%   BMI 21.48 kg/m  Physical Exam Vitals and nursing note reviewed.  Constitutional:      Appearance: Normal appearance.  HENT:     Head: Atraumatic.     Nose: Nose normal.     Mouth/Throat:     Mouth: Mucous membranes are moist.  Eyes:     Extraocular Movements: Extraocular movements intact.     Pupils: Pupils are equal, round, and reactive to light.  Cardiovascular:     Rate and Rhythm: Normal rate.     Pulses: Normal pulses.  Pulmonary:     Effort: Pulmonary effort is normal.     Breath sounds: Normal breath sounds.  Abdominal:     Palpations: Abdomen is soft.  Musculoskeletal:        General: Tenderness and signs of injury present.     Cervical back: Normal range of motion and neck supple.  Skin:    General: Skin is warm and dry.  Neurological:     Mental Status: She is alert and oriented to person, place, and time.  Psychiatric:        Mood and Affect: Mood normal.  Behavior: Behavior normal.    ED Results / Procedures / Treatments   Labs (all labs ordered are listed, but only abnormal results are displayed) Labs Reviewed  CBC WITH DIFFERENTIAL/PLATELET - Abnormal; Notable for the following components:      Result Value   RBC 3.85 (*)    Hemoglobin 11.6 (*)    Platelets 145 (*)    Neutro Abs 8.7 (*)    All other components within normal limits  BASIC METABOLIC PANEL  CK  HEPATIC FUNCTION PANEL  MAGNESIUM  PHOSPHORUS  TSH  URINALYSIS, COMPLETE (UACMP) WITH MICROSCOPIC    EKG None  Radiology DG Knee 2 Views Left  Result Date: 06/03/2021 CLINICAL DATA:  Fall, left knee pain EXAM: LEFT KNEE - 1-2 VIEW COMPARISON:  None Available. FINDINGS: Osseous structures are diffusely osteopenic. Normal alignment. No acute fracture or  dislocation. 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 Patellar ORIF has been performed with 2 partially threaded log tendon screws noted. There is thickening of the quadriceps tendon which may be post inflammatory or posttraumatic in nature. Soft tissues are otherwise unremarkable. No effusion. IMPRESSION: No acute abnormality. Electronically Signed   By: Helyn Numbers M.D.   On: 06/03/2021 22:05   DG Hip Unilat W or Wo Pelvis 2-3 Views Left  Result Date: 06/03/2021 CLINICAL DATA:  Fall, left hip pain EXAM: DG HIP (WITH OR WITHOUT PELVIS) 2-3V LEFT COMPARISON:  None Available. FINDINGS: Osseous structures are diffusely osteopenic. There is an acute, mildly comminuted intratrochanteric fracture of the left hip with override and external rotation of the distal fracture fragment. Femoral head appears seated within the left acetabulum. Left hip joint space is preserved. Limited evaluation of the right hip is unremarkable. Vascular calcifications are noted. IMPRESSION: Acute mildly comminuted left intratrochanteric hip fracture. Osteopenia. Electronically Signed   By: Helyn Numbers M.D.   On: 06/03/2021 22:06    Procedures Procedures  {Document cardiac monitor, telemetry assessment procedure when appropriate:1}  Medications Ordered in ED Medications  morphine (PF) 2 MG/ML injection 2 mg (2 mg Intravenous Given 06/03/21 2338)    ED Course/ Medical Decision Making/ A&P                           Medical Decision Making Patient with fall at home, resulting in intertrochanteric fracture of left hip. Although listed in current medications, patient is not currently on eliquis (last dose December 2022).  Amount and/or Complexity of Data Reviewed Labs: ordered. Radiology: ordered.    Details: patient with intertrochanteric fracture of left hip Discussion of management or test interpretation with external provider(s): Discussed with orthopedics Sherilyn Dacosta). Admit to medicine,  will schedule surgery.    Discussed with hospitalist [facility]) for admission.  {Document critical care time when appropriate:1} {Document review of labs and clinical decision tools ie heart score, Chads2Vasc2 etc:1}  {Document your independent review of radiology images, and any outside records:1} {Document your discussion with family members, caretakers, and with consultants:1} {Document social determinants of health affecting pt's care:1} {Document your decision making why or why not admission, treatments were needed:1} Final Clinical Impression(s) / ED Diagnoses Final diagnoses:  Closed nondisplaced intertrochanteric fracture of left femur, initial encounter (HCC)    Rx / DC Orders ED Discharge Orders     None

## 2021-06-03 NOTE — Assessment & Plan Note (Signed)
Anesthesiology should be aware prior to OR of complex vascular history

## 2021-06-03 NOTE — ED Notes (Signed)
Pt off unit to CT

## 2021-06-03 NOTE — Assessment & Plan Note (Signed)
Restart home meds when able to tolerate PO

## 2021-06-03 NOTE — H&P (Signed)
Stephanie Collier ZOX:[id number] DOB: January 06, 1936 DOA: 06/03/2021     PCP: Marden Noble, MD   Outpatient Specialists:     Orthopedics: Dr. Harrie Jeans Surgery Nada Libman, MD Patient arrived to ER on 06/03/21 at 2057 Referred by Attending Ernie Avena, MD   Patient coming from:    home Lives  With family    Chief Complaint:   Chief Complaint  Patient presents with   Fall    HPI: Stephanie Collier is a 86 y.o. female with medical history significant of  Thoracic aortic aneurysm, COPD, Iliac artery aneurysm, bilateral     Presented with   fall Pt was cooking in the kitchen She had baseline uses a walker for ambulation lost her balance fell landed on her left knee and hip as well as hit the back of her head on the stove no LOC Patient is already on Eliquis. Severe pain unable to tolerate weightbearing Was brought into the ER  Reports has not been taking her Eliquis since December that was post operative after her knee surgery November 2022 Family now here and says they saw her fall, she was frying on the stove and grease popped made her jump and she fell backwards Does not smoke does not drink Never needed to be on oxygen No CP , able to walk around the block  Regarding pertinent Chronic problems:      HTN on Cozaar   COPD - not  followed by pulmonology   not  on baseline oxygen       HX of PE - not on anticoagulation anymore   Chronic anemia - baseline hg Hemoglobin & Hematocrit  Recent Labs    11/20/20 1023 06/03/21 2242  HGB 10.2* 11.6*     While in ER:    Imaging shows left hip intertrochanteric fracture ER discussed with orthopedics Dr. Sherilyn Dacosta who are asking for medicine to admit   Ordered HIP imaging Acute mildly comminuted left intratrochanteric hip fracture. CT HEAD *** NON acute  CXR - ***NON acute    Following Medications were ordered in ER: Medications - No data to display  _______________________________________________________ ER  Provider Called:  Orthopedics   Dr. Sherilyn Dacosta They Recommend admit to medicine   Will see in AM      ED Triage Vitals  Enc Vitals Group     BP 06/03/21 2100 (!) 179/67     Pulse Rate 06/03/21 2105 65     Resp 06/03/21 2105 18     Temp 06/03/21 2103 (!) 97.4 F (36.3 C)     Temp Source 06/03/21 2103 Oral     SpO2 06/03/21 2105 100 %     Weight 06/03/21 2112 110 lb (49.9 kg)     Height 06/03/21 2112 5' (1.524 m)     Head Circumference --      Peak Flow --      Pain Score 06/03/21 2111 6     Pain Loc --      Pain Edu? --      Excl. in GC? --   TMAX(24)@     _________________________________________ Significant initial  Findings: Abnormal Labs Reviewed  CBC WITH DIFFERENTIAL/PLATELET - Abnormal; Notable for the following components:      Result Value   RBC 3.85 (*)    Hemoglobin 11.6 (*)    Platelets 145 (*)    Neutro Abs 8.7 (*)    All other components within normal limits     _________________________ Troponin ***ordered  ECG: Ordered Personally reviewed by me showing: HR : 57 Rhythm:  Sinus tachycardia   LVH LAD and secondary repol abnormality QTC 533    The recent clinical data is shown below. Vitals:   06/03/21 2105 06/03/21 2112 06/03/21 2200 06/03/21 2300  BP:   (!) 174/55 (!) 159/52  Pulse: 65  67 64  Resp: 18  19 14   Temp:      TempSrc:      SpO2: 100%  100% 99%  Weight:  49.9 kg    Height:  5' (1.524 m)       WBC     Component Value Date/Time   WBC 10.2 06/03/2021 2242   LYMPHSABS 0.9 06/03/2021 2242   MONOABS 0.5 06/03/2021 2242   EOSABS 0.0 06/03/2021 2242   BASOSABS 0.0 06/03/2021 2242       UA *** no evidence of UTI  ***Pending ***not ordered   Urine analysis:    Component Value Date/Time   COLORURINE AMBER (A) 04/03/2014 1335   APPEARANCEUR CLOUDY (A) 04/03/2014 1335   LABSPEC 1.029 04/03/2014 1335   PHURINE 5.5 04/03/2014 1335   GLUCOSEU NEGATIVE 04/03/2014 1335   HGBUR MODERATE (A) 04/03/2014 1335   BILIRUBINUR NEGATIVE 04/03/2014  1335   KETONESUR NEGATIVE 04/03/2014 1335   PROTEINUR NEGATIVE 04/03/2014 1335   UROBILINOGEN 0.2 04/03/2014 1335   NITRITE NEGATIVE 04/03/2014 1335   LEUKOCYTESUR SMALL (A) 04/03/2014 1335    _______________________________________________ Hospitalist was called for admission for   Closed nondisplaced intertrochanteric fracture of left femur, initial encounter (HCC)    The following Work up has been ordered so far:  Orders Placed This Encounter  Procedures   CT Head Wo Contrast   DG Knee 2 Views Left   DG Hip Unilat W or Wo Pelvis 2-3 Views Left   CBC with Differential   Basic metabolic panel   Consult to orthopedic surgery   Consult to hospitalist   Saline lock IV     OTHER Significant initial  Findings:  labs showing:    No results for input(s): NA, K, CO2, GLUCOSE, BUN, CREATININE, CALCIUM, MG, PHOS in the last 168 hours.  Cr  * stable,  Up from baseline see below Lab Results  Component Value Date   CREATININE 0.8 10/29/2014   CREATININE 0.70 06/26/2014   CREATININE 0.43 (L) 04/10/2014    No results for input(s): AST, ALT, ALKPHOS, BILITOT, PROT, ALBUMIN in the last 168 hours. Lab Results  Component Value Date   CALCIUM 8.2 (L) 04/10/2014    Plt: Lab Results  Component Value Date   PLT 145 (L) 06/03/2021        Recent Labs  Lab 06/03/21 2242  WBC 10.2  NEUTROABS 8.7*  HGB 11.6*  HCT 36.6  MCV 95.1  PLT 145*    HG/HCT  stable,     Component Value Date/Time   HGB 11.6 (L) 06/03/2021 2242   HCT 36.6 06/03/2021 2242   MCV 95.1 06/03/2021 2242     Cardiac Panel (last 3 results) No results for input(s): CKTOTAL, CKMB, TROPONINI, RELINDX in the last 72 hours.  .car   Cultures:    Component Value Date/Time   SDES BLOOD RIGHT ARM 03/30/2014 0849   SPECREQUEST BOTTLES DRAWN AEROBIC AND ANAEROBIC 5CC 03/30/2014 0849   CULT  03/30/2014 0849    NO GROWTH 5 DAYS Note: Culture results may be compromised due to an excessive volume of blood received  in culture bottles. Performed at 04/01/2014  REPTSTATUS 04/05/2014 FINAL 03/30/2014 0849     Radiological Exams on Admission: DG Knee 2 Views Left  Result Date: 06/03/2021 CLINICAL DATA:  Fall, left knee pain EXAM: LEFT KNEE - 1-2 VIEW COMPARISON:  None Available. FINDINGS: Osseous structures are diffusely osteopenic. Normal alignment. No acute fracture or dislocation. 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 Patellar ORIF has been performed with 2 partially threaded log tendon screws noted. There is thickening of the quadriceps tendon which may be post inflammatory or posttraumatic in nature. Soft tissues are otherwise unremarkable. No effusion. IMPRESSION: No acute abnormality. Electronically Signed   By: Helyn Numbers M.D.   On: 06/03/2021 22:05   DG Hip Unilat W or Wo Pelvis 2-3 Views Left  Result Date: 06/03/2021 CLINICAL DATA:  Fall, left hip pain EXAM: DG HIP (WITH OR WITHOUT PELVIS) 2-3V LEFT COMPARISON:  None Available. FINDINGS: Osseous structures are diffusely osteopenic. There is an acute, mildly comminuted intratrochanteric fracture of the left hip with override and external rotation of the distal fracture fragment. Femoral head appears seated within the left acetabulum. Left hip joint space is preserved. Limited evaluation of the right hip is unremarkable. Vascular calcifications are noted. IMPRESSION: Acute mildly comminuted left intratrochanteric hip fracture. Osteopenia. Electronically Signed   By: Helyn Numbers M.D.   On: 06/03/2021 22:06   _______________________________________________________________________________________________________ Latest  Blood pressure (!) 159/52, pulse 64, temperature (!) 97.4 F (36.3 C), temperature source Oral, resp. rate 14, height 5' (1.524 m), weight 49.9 kg, SpO2 99 %.   Vitals  labs and radiology finding personally reviewed  Review of Systems:    Pertinent positives include: fall, hip  pain  Constitutional:  No weight loss, night sweats, Fevers, chills, fatigue, weight loss  HEENT:  No headaches, Difficulty swallowing,Tooth/dental problems,Sore throat,  No sneezing, itching, ear ache, nasal congestion, post nasal drip,  Cardio-vascular:  No chest pain, Orthopnea, PND, anasarca, dizziness, palpitations.no Bilateral lower extremity swelling  GI:  No heartburn, indigestion, abdominal pain, nausea, vomiting, diarrhea, change in bowel habits, loss of appetite, melena, blood in stool, hematemesis Resp:  no shortness of breath at rest. No dyspnea on exertion, No excess mucus, no productive cough, No non-productive cough, No coughing up of blood.No change in color of mucus.No wheezing. Skin:  no rash or lesions. No jaundice GU:  no dysuria, change in color of urine, no urgency or frequency. No straining to urinate.  No flank pain.  Musculoskeletal:  No joint pain or no joint swelling. No decreased range of motion. No back pain.  Psych:  No change in mood or affect. No depression or anxiety. No memory loss.  Neuro: no localizing neurological complaints, no tingling, no weakness, no double vision, no gait abnormality, no slurred speech, no confusion  All systems reviewed and apart from HOPI all are negative _______________________________________________________________________________________________ Past Medical History:   Past Medical History:  Diagnosis Date   AAA (abdominal aortic aneurysm) (HCC)    Arthritis    hip   Hypertension    no meds now   Osteoporosis    Patellar fracture    left   Raynaud's disease    Scoliosis       Past Surgical History:  Procedure Laterality Date   APPENDECTOMY     at age 20   BREAST BIOPSY Left    CHEST TUBE INSERTION Left 04/04/2014   Procedure: CHEST TUBE INSERTION;  Surgeon: Nada Libman, MD;  Location: [facility] OR;  Service: Vascular;  Laterality: Left;  by Dr. Collier Bullock  INSERTION OF VENA CAVA FILTER N/A 04/02/2014    Procedure: INSERTION OF VENA CAVA FILTER;  Surgeon: Nada Libman, MD;  Location: [facility] CATH LAB;  Service: Cardiovascular;  Laterality: N/A;   ORIF PATELLA Left 11/20/2020   Procedure: OPEN REDUCTION INTERNAL (ORIF) FIXATION PATELLA;  Surgeon: Joen Laura, MD;  Location: [facility];  Service: Orthopedics;  Laterality: Left;   PERIPHERAL VASCULAR CATHETERIZATION N/A 06/26/2014   Procedure: IVC Filter Removal;  Surgeon: Nada Libman, MD;  Location: [facility] INVASIVE CV LAB;  Service: Cardiovascular;  Laterality: N/A;   THORACIC AORTIC ENDOVASCULAR STENT GRAFT N/A 04/04/2014   Procedure: THORACIC AORTIC ENDOVASCULAR STENT GRAFT;  Surgeon: Nada Libman, MD;  Location: [facility] OR;  Service: Vascular;  Laterality: N/A;    Social History:  Ambulatory  walker        reports that she has never smoked. She has never used smokeless tobacco. She reports that she does not currently use alcohol. She reports that she does not use drugs.     Family History:   Family History  Problem Relation Age of Onset   Breast cancer Daughter 52   ______________________________________________________________________________________________ Allergies: Allergies  Allergen Reactions   Fentanyl Shortness Of Breath   Asa [Aspirin]     Per patient stomach cramps   Azithromycin     Other reaction(s): diarrhea   Fosamax [Alendronate]     Other reaction(s): indigestion   Labetalol Hcl     Other reaction(s): indigestion     Prior to Admission medications   Medication Sig Start Date End Date Taking? Authorizing Provider  apixaban (ELIQUIS) 2.5 MG TABS tablet Take 1 tablet (2.5 mg total) by mouth 2 (two) times daily. 11/20/20   Joen Laura, MD    ___________________________________________________________________________________________________ Physical Exam:    06/03/2021   11:00 PM 06/03/2021   10:00 PM 06/03/2021    9:12 PM  Vitals with BMI  Height     Weight   110 lbs   BMI   21.48  Systolic 159 174   Diastolic 52 55   Pulse 64 67      1. General:  in No  Acute distress   well   -appearing 2. Psychological: Alert and   Oriented 3. Head/ENT:   Dry Mucous Membranes                          Head Non traumatic, neck supple                          N Poor Dentition 4. SKIN:   decreased Skin turgor,  Skin clean Dry and intact no rash 5. Heart: Regular rate and rhythm no*** Murmur, no Rub or gallop 6. Lungs:  , no wheezes or crackles   7. Abdomen: Soft,  non-tender, Non distended  bowel sounds present 8. Lower extremities: no clubbing, cyanosis, no  edema 9. Neurologically Grossly intact, moving all 4 extremities equally *** strength 5 out of 5 in all 4 extremities cranial nerves II through XII intact 10. MSK: Normal range of motion  Limited in left hip  Chart has been reviewed  ______________________________________________________________________________________________  Assessment/Plan 86 y.o. female with medical history significant of  Thoracic aortic aneurysm, COPD, Iliac artery aneurysm, bilateral    Admitted for   Closed nondisplaced intertrochanteric fracture of left femur, initial encounter (HCC)     Present on Admission: **None**  No problem-specific Assessment & Plan notes found for this encounter.    Other plan as per orders.  DVT prophylaxis:  SCD    Code Status:    Code Status: Prior FULL CODE  as per patient / family  I had personally discussed CODE STATUS with patient and family     Family Communication:   Family   at  Bedside  plan of care was discussed  with  Daughter   Disposition Plan:     likely will need placement for rehabilitation                            Following barriers for discharge:                             Hip fracture repaired                             Pain controlled with PO medications                                                         Will need consultants to evaluate patient prior  to discharge    Consults called: orthopedics  Admission status:  ED Disposition     ED Disposition  Admit   Condition  --   Comment  [facility] [[id number]]  Level of Care: Telemetry Medical [[id number]]  [facility] if equivalent level of care is available:: No  Covid Evaluation: Asymptomatic - no recent exposure (last 10 days) testing not required  Diagnosis: Closed left hip fracture [facility](HCC) [[id number]]  Admitting Physician: Therisa DoyneUTOVA, Cheyene Hamric [[id number]]  Attending Physician: Therisa DoyneUTOVA, Lawerence Dery [[id number]]  Estimated length of stay: past midnight tomorrow  Certification:: I certify this patient will need inpatient services for at least 2 midnights             inpatient     I Expect 2 midnight stay secondary to severity of patient's current illness need for inpatient interventions justified by the following:     Severe lab/radiological/exam abnormalities including:    LEft hip frx and extensive comorbidities including:  COPD/asthma    That are currently affecting medical management.   I expect  patient to be hospitalized for 2 midnights requiring inpatient medical care.  Patient is at high risk for adverse outcome (such as loss of life or disability) if not treated.  Indication for inpatient stay as follows:    severe pain requiring acute inpatient management,  inability to maintain oral hydration    Need for operative/procedural  intervention     Need for   IV fluids, , IV pain medications,    Level of care     tele  For 12H    Idrees Quam 06/03/2021, 11:16 PM ***  [facility]     after 2 AM please page floor coverage PA If 7AM-7PM, please contact the day team taking care of the patient using [email or web address]   Patient was evaluated in the context of the global COVID-19 pandemic, which necessitated consideration that the patient might be at risk for infection with the SARS-CoV-2 virus that causes  COVID-19.  Institutional protocols and algorithms that pertain to the evaluation of patients at risk for COVID-19 are in a state of rapid change based on information released by regulatory bodies including the CDC and federal and state organizations. These policies and algorithms were followed during the patient's care.

## 2021-06-03 NOTE — Subjective & Objective (Signed)
Pt was cooking in the kitchen She had baseline uses a walker for ambulation lost her balance fell landed on her left knee and hip as well as hit the back of her head on the stove no LOC Patient is already on Eliquis. Severe pain unable to tolerate weightbearing Was brought into the ER

## 2021-06-03 NOTE — ED Provider Notes (Signed)
[facility] EMERGENCY DEPARTMENT Provider Note   CSN: [id number] Arrival date & time: 06/03/21  2057     History  Chief Complaint  Patient presents with   Lytle Michaels    Stephanie Collier is a 86 y.o. female.  86 year old female with prior history of left patellar fracture 11/22, uses a walker for ambulation, lost her balance while in the kitchen at home, resulting in a fall where she landed on her left knee and hip, as well as hitting the back of her head on the stover. No obvious external head trauma. She denies loss of consciousness. She is on eliquis. Distal pulse, sensation and movement present. Some outward rotation of foot noted. Patient with tenderness in knee, groin, upper femur and hip on the left.  The history is provided by the patient and medical records. No language interpreter was used.  Fall This is a new problem. The current episode started 1 to 2 hours ago. Pertinent negatives include no chest pain, no abdominal pain, no headaches and no shortness of breath.  Hip Pain This is a new problem. The current episode started 1 to 2 hours ago. The problem has not changed since onset.Pertinent negatives include no chest pain, no abdominal pain, no headaches and no shortness of breath.      Home Medications Prior to Admission medications   Medication Sig Start Date End Date Taking? Authorizing Provider  apixaban (ELIQUIS) 2.5 MG TABS tablet Take 1 tablet (2.5 mg total) by mouth 2 (two) times daily. 11/20/20   Willaim Sheng, MD      Allergies    Fentanyl, Asa [aspirin], Azithromycin, Fosamax [alendronate], and Labetalol hcl    Review of Systems   Review of Systems  Respiratory:  Negative for shortness of breath.   Cardiovascular:  Negative for chest pain.  Gastrointestinal:  Negative for abdominal pain.  Musculoskeletal:  Positive for arthralgias. Negative for neck pain.  Neurological:  Negative for headaches.  All other systems reviewed and are  negative.  Physical Exam Updated Vital Signs BP (!) 179/67   Pulse 65   Temp (!) 97.4 F (36.3 C) (Oral)   Resp 18   Ht 5' (1.524 m)   Wt 49.9 kg   SpO2 100%   BMI 21.48 kg/m  Physical Exam Vitals and nursing note reviewed.  Constitutional:      Appearance: Normal appearance.  HENT:     Head: Atraumatic.     Nose: Nose normal.     Mouth/Throat:     Mouth: Mucous membranes are moist.  Eyes:     Extraocular Movements: Extraocular movements intact.     Pupils: Pupils are equal, round, and reactive to light.  Cardiovascular:     Rate and Rhythm: Normal rate.     Pulses: Normal pulses.  Pulmonary:     Effort: Pulmonary effort is normal.     Breath sounds: Normal breath sounds.  Abdominal:     Palpations: Abdomen is soft.  Musculoskeletal:        General: Tenderness and signs of injury present.     Cervical back: Normal range of motion and neck supple.  Skin:    General: Skin is warm and dry.  Neurological:     Mental Status: She is alert and oriented to person, place, and time.  Psychiatric:        Mood and Affect: Mood normal.        Behavior: Behavior normal.    ED Results /  Procedures / Treatments   Labs (all labs ordered are listed, but only abnormal results are displayed) Labs Reviewed  CBC WITH DIFFERENTIAL/PLATELET - Abnormal; Notable for the following components:      Result Value   RBC 3.85 (*)    Hemoglobin 11.6 (*)    Platelets 145 (*)    Neutro Abs 8.7 (*)    All other components within normal limits  BASIC METABOLIC PANEL  CK  HEPATIC FUNCTION PANEL  MAGNESIUM  PHOSPHORUS  TSH  URINALYSIS, COMPLETE (UACMP) WITH MICROSCOPIC    EKG None  Radiology DG Knee 2 Views Left  Result Date: 06/03/2021 CLINICAL DATA:  Fall, left knee pain EXAM: LEFT KNEE - 1-2 VIEW COMPARISON:  None Available. FINDINGS: Osseous structures are diffusely osteopenic. Normal alignment. No acute fracture or dislocation. Mild tricompartmental degenerative arthritis with  joint space narrowing. Patellar ORIF has been performed with 2 partially threaded log tendon screws noted. There is thickening of the quadriceps tendon which may be post inflammatory or posttraumatic in nature. Soft tissues are otherwise unremarkable. No effusion. IMPRESSION: No acute abnormality. Electronically Signed   By: Fidela Salisbury M.D.   On: 06/03/2021 22:05   DG Hip Unilat W or Wo Pelvis 2-3 Views Left  Result Date: 06/03/2021 CLINICAL DATA:  Fall, left hip pain EXAM: DG HIP (WITH OR WITHOUT PELVIS) 2-3V LEFT COMPARISON:  None Available. FINDINGS: Osseous structures are diffusely osteopenic. There is an acute, mildly comminuted intratrochanteric fracture of the left hip with override and external rotation of the distal fracture fragment. Femoral head appears seated within the left acetabulum. Left hip joint space is preserved. Limited evaluation of the right hip is unremarkable. Vascular calcifications are noted. IMPRESSION: Acute mildly comminuted left intratrochanteric hip fracture. Osteopenia. Electronically Signed   By: Fidela Salisbury M.D.   On: 06/03/2021 22:06    Procedures Procedures    Medications Ordered in ED Medications  morphine (PF) 2 MG/ML injection 2 mg (2 mg Intravenous Given 06/03/21 2338)    ED Course/ Medical Decision Making/ A&P                           Medical Decision Making Patient with fall at home, resulting in intertrochanteric fracture of left hip. Although listed in current medications, patient is not currently on eliquis (last dose December 2022).  Amount and/or Complexity of Data Reviewed Labs: ordered. Radiology: ordered.    Details: patient with intertrochanteric fracture of left hip Discussion of management or test interpretation with external provider(s): Discussed with orthopedics Mable Fill). Admit to medicine, will schedule surgery.    Discussed with hospitalist [facility]) for admission.        Final Clinical Impression(s) / ED  Diagnoses Final diagnoses:  Closed nondisplaced intertrochanteric fracture of left femur, initial encounter [facility])    Rx / [place] Orders ED Discharge Orders     None         Etta Quill, NP 06/04/21 0005    Regan Lemming, MD 06/06/21 1008

## 2021-06-03 NOTE — ED Triage Notes (Signed)
Pt BIBA from home. Pt was cooking dinner twisted towards bad Knee side (L) and fell on L hip. Pt reporting hitting head on the back of the stove without LOC. Pt c/o L side hip pain. L foot externally rotated.

## 2021-06-03 NOTE — ED Notes (Signed)
Pts L leg splinted by EMS. NV intact distally. Pulses +2 in foot. Cap refill <2sec.

## 2021-06-04 ENCOUNTER — Encounter (HOSPITAL_COMMUNITY): Payer: Self-pay | Admitting: Internal Medicine

## 2021-06-04 ENCOUNTER — Inpatient Hospital Stay (HOSPITAL_COMMUNITY): Payer: Medicare Other

## 2021-06-04 ENCOUNTER — Encounter (HOSPITAL_COMMUNITY)
Admission: EM | Disposition: A | Discharge status: Skilled Nursing Facility | Payer: Self-pay | Source: Home / Self Care | Attending: Internal Medicine

## 2021-06-04 ENCOUNTER — Encounter (HOSPITAL_COMMUNITY): Payer: Self-pay | Admitting: Anesthesiology

## 2021-06-04 ENCOUNTER — Inpatient Hospital Stay (HOSPITAL_COMMUNITY): Payer: Medicare Other | Admitting: Anesthesiology

## 2021-06-04 ENCOUNTER — Other Ambulatory Visit: Payer: Self-pay

## 2021-06-04 DIAGNOSIS — D649 Anemia, unspecified: Secondary | ICD-10-CM | POA: Diagnosis present

## 2021-06-04 DIAGNOSIS — S72142A Displaced intertrochanteric fracture of left femur, initial encounter for closed fracture: Secondary | ICD-10-CM | POA: Diagnosis not present

## 2021-06-04 DIAGNOSIS — M1990 Unspecified osteoarthritis, unspecified site: Secondary | ICD-10-CM

## 2021-06-04 DIAGNOSIS — I10 Essential (primary) hypertension: Secondary | ICD-10-CM

## 2021-06-04 DIAGNOSIS — R011 Cardiac murmur, unspecified: Secondary | ICD-10-CM | POA: Diagnosis not present

## 2021-06-04 DIAGNOSIS — W19XXXA Unspecified fall, initial encounter: Secondary | ICD-10-CM

## 2021-06-04 DIAGNOSIS — G8918 Other acute postprocedural pain: Secondary | ICD-10-CM | POA: Diagnosis not present

## 2021-06-04 DIAGNOSIS — S72002A Fracture of unspecified part of neck of left femur, initial encounter for closed fracture: Secondary | ICD-10-CM

## 2021-06-04 HISTORY — PX: INTRAMEDULLARY (IM) NAIL INTERTROCHANTERIC: SHX5875

## 2021-06-04 LAB — ECHOCARDIOGRAM COMPLETE
AR max vel: 1.32 cm2
AV Area VTI: 1.42 cm2
AV Area mean vel: 1.31 cm2
AV Mean grad: 8 mmHg
AV Peak grad: 15.6 mmHg
Ao pk vel: 1.98 m/s
Area-P 1/2: 2.73 cm2
Calc EF: 58.2 %
Height: 60 in
MV VTI: 2.24 cm2
P 1/2 time: 407 msec
S' Lateral: 3.4 cm
Single Plane A2C EF: 63.4 %
Single Plane A4C EF: 54.1 %
Weight: 1760 oz

## 2021-06-04 LAB — HEPATIC FUNCTION PANEL
ALT: 12 U/L (ref 0–44)
AST: 21 U/L (ref 15–41)
Albumin: 3.3 g/dL — ABNORMAL LOW (ref 3.5–5.0)
Alkaline Phosphatase: 67 U/L (ref 38–126)
Bilirubin, Direct: 0.1 mg/dL (ref 0.0–0.2)
Indirect Bilirubin: 1.1 mg/dL — ABNORMAL HIGH (ref 0.3–0.9)
Total Bilirubin: 1.2 mg/dL (ref 0.3–1.2)
Total Protein: 5.9 g/dL — ABNORMAL LOW (ref 6.5–8.1)

## 2021-06-04 LAB — TSH: TSH: 4.276 u[IU]/mL (ref 0.350–4.500)

## 2021-06-04 LAB — RETICULOCYTES
Immature Retic Fract: 11.3 % (ref 2.3–15.9)
RBC.: 3.51 MIL/uL — ABNORMAL LOW (ref 3.87–5.11)
Retic Count, Absolute: 37.6 10*3/uL (ref 19.0–186.0)
Retic Ct Pct: 1.1 % (ref 0.4–3.1)

## 2021-06-04 LAB — CK: Total CK: 61 U/L (ref 38–234)

## 2021-06-04 LAB — PHOSPHORUS: Phosphorus: 3.2 mg/dL (ref 2.5–4.6)

## 2021-06-04 LAB — PROTIME-INR
INR: 1.2 (ref 0.8–1.2)
Prothrombin Time: 14.8 seconds (ref 11.4–15.2)

## 2021-06-04 LAB — TYPE AND SCREEN
ABO/RH(D): O POS
Antibody Screen: NEGATIVE

## 2021-06-04 LAB — FOLATE: Folate: 21.9 ng/mL (ref >5.9)

## 2021-06-04 LAB — IRON AND TIBC
Iron: 19 ug/dL — ABNORMAL LOW (ref 28–170)
Saturation Ratios: 8 % — ABNORMAL LOW (ref 10.4–31.8)
TIBC: 234 ug/dL — ABNORMAL LOW (ref 250–450)
UIBC: 215 ug/dL

## 2021-06-04 LAB — VITAMIN B12: Vitamin B-12: 178 pg/mL — ABNORMAL LOW (ref 180–914)

## 2021-06-04 LAB — TROPONIN I (HIGH SENSITIVITY): Troponin I (High Sensitivity): 9 ng/L (ref <18)

## 2021-06-04 LAB — SURGICAL PCR SCREEN
MRSA, PCR: NEGATIVE
Staphylococcus aureus: NEGATIVE

## 2021-06-04 LAB — VITAMIN D 25 HYDROXY (VIT D DEFICIENCY, FRACTURES): Vit D, 25-Hydroxy: 28.16 ng/mL — ABNORMAL LOW (ref 30–100)

## 2021-06-04 LAB — ALBUMIN: Albumin: 3.2 g/dL — ABNORMAL LOW (ref 3.5–5.0)

## 2021-06-04 LAB — MAGNESIUM: Magnesium: 1.8 mg/dL (ref 1.7–2.4)

## 2021-06-04 LAB — FERRITIN: Ferritin: 157 ng/mL (ref 11–307)

## 2021-06-04 SURGERY — FIXATION, FRACTURE, INTERTROCHANTERIC, WITH INTRAMEDULLARY ROD
Anesthesia: General | Laterality: Left

## 2021-06-04 MED ORDER — CEFAZOLIN SODIUM-DEXTROSE 2-4 GM/100ML-% IV SOLN
2.0000 g | INTRAVENOUS | Status: AC
  Administered 2021-06-04: 2 g via INTRAVENOUS

## 2021-06-04 MED ORDER — FENTANYL CITRATE (PF) 250 MCG/5ML IJ SOLN
INTRAMUSCULAR | Status: AC
  Filled 2021-06-04: qty 5

## 2021-06-04 MED ORDER — ACETAMINOPHEN 10 MG/ML IV SOLN
INTRAVENOUS | Status: AC
  Filled 2021-06-04: qty 100

## 2021-06-04 MED ORDER — PHENOL 1.4 % MT LIQD: 1.0000 | OROMUCOSAL | Status: DC | PRN

## 2021-06-04 MED ORDER — LIDOCAINE HCL (PF) 2 % IJ SOLN
INTRAMUSCULAR | Status: DC | PRN
  Administered 2021-06-04: 40 mg via PERINEURAL
  Administered 2021-06-04: 80 mg via PERINEURAL

## 2021-06-04 MED ORDER — EPHEDRINE SULFATE-NACL 50-0.9 MG/10ML-% IV SOSY
PREFILLED_SYRINGE | INTRAVENOUS | Status: DC | PRN
  Administered 2021-06-04: 5 mg via INTRAVENOUS

## 2021-06-04 MED ORDER — HYDROCODONE-ACETAMINOPHEN 5-325 MG PO TABS: 1.0000 | ORAL_TABLET | ORAL | Status: DC | PRN

## 2021-06-04 MED ORDER — ONDANSETRON HCL 4 MG PO TABS: 4.0000 mg | ORAL_TABLET | Freq: Four times a day (QID) | ORAL | Status: DC | PRN

## 2021-06-04 MED ORDER — MIDAZOLAM HCL 2 MG/2ML IJ SOLN
2.0000 mg | Freq: Once | INTRAMUSCULAR | Status: DC
Start: 2021-06-04 — End: 2021-06-04

## 2021-06-04 MED ORDER — MORPHINE SULFATE (PF) 2 MG/ML IV SOLN
0.5000 mg | INTRAVENOUS | Status: DC | PRN
  Administered 2021-06-05 – 2021-06-06 (×3): 1 mg via INTRAVENOUS
  Filled 2021-06-04 (×3): qty 1

## 2021-06-04 MED ORDER — ENSURE ENLIVE PO LIQD
237.0000 mL | Freq: Two times a day (BID) | ORAL | Status: DC
  Administered 2021-06-05 – 2021-06-09 (×5): 237 mL via ORAL
  Filled 2021-06-04 (×2): qty 237

## 2021-06-04 MED ORDER — CEFAZOLIN SODIUM-DEXTROSE 2-4 GM/100ML-% IV SOLN
INTRAVENOUS | Status: AC
  Filled 2021-06-04: qty 100

## 2021-06-04 MED ORDER — 0.9 % SODIUM CHLORIDE (POUR BTL) OPTIME
TOPICAL | Status: DC | PRN
  Administered 2021-06-04: 1000 mL

## 2021-06-04 MED ORDER — FENTANYL CITRATE (PF) 100 MCG/2ML IJ SOLN
INTRAMUSCULAR | Status: AC
  Administered 2021-06-04: 25 ug
  Filled 2021-06-04: qty 2

## 2021-06-04 MED ORDER — BUPIVACAINE-EPINEPHRINE (PF) 0.5% -1:200000 IJ SOLN
INTRAMUSCULAR | Status: DC | PRN
  Administered 2021-06-04: 20 mL via PERINEURAL

## 2021-06-04 MED ORDER — FENTANYL CITRATE (PF) 100 MCG/2ML IJ SOLN
25.0000 ug | INTRAMUSCULAR | Status: DC | PRN
  Administered 2021-06-04: 50 ug via INTRAVENOUS

## 2021-06-04 MED ORDER — DEXMEDETOMIDINE (PRECEDEX) IN NS 20 MCG/5ML (4 MCG/ML) IV SYRINGE
PREFILLED_SYRINGE | INTRAVENOUS | Status: DC | PRN
  Administered 2021-06-04: 12 ug via INTRAVENOUS

## 2021-06-04 MED ORDER — TRANEXAMIC ACID-NACL 1000-0.7 MG/100ML-% IV SOLN
1000.0000 mg | Freq: Once | INTRAVENOUS | Status: AC
  Administered 2021-06-04: 1000 mg via INTRAVENOUS
  Filled 2021-06-04: qty 100

## 2021-06-04 MED ORDER — ONDANSETRON HCL 4 MG/2ML IJ SOLN
INTRAMUSCULAR | Status: DC | PRN
Start: 2021-06-04 — End: 2021-06-04
  Administered 2021-06-04: 4 mg via INTRAVENOUS

## 2021-06-04 MED ORDER — DEXAMETHASONE SODIUM PHOSPHATE 10 MG/ML IJ SOLN
INTRAMUSCULAR | Status: DC | PRN
  Administered 2021-06-04: 10 mg via INTRAVENOUS

## 2021-06-04 MED ORDER — SODIUM CHLORIDE 0.9 % IV SOLN
INTRAVENOUS | Status: DC
Start: 2021-06-04 — End: 2021-06-05

## 2021-06-04 MED ORDER — ACETAMINOPHEN 325 MG PO TABS
325.0000 mg | ORAL_TABLET | Freq: Four times a day (QID) | ORAL | Status: DC | PRN
  Administered 2021-06-07: 650 mg via ORAL
  Filled 2021-06-04: qty 2

## 2021-06-04 MED ORDER — MORPHINE SULFATE (PF) 2 MG/ML IV SOLN
0.5000 mg | INTRAVENOUS | Status: DC | PRN
  Administered 2021-06-04 – 2021-06-06 (×8): 0.5 mg via INTRAVENOUS
  Filled 2021-06-04 (×8): qty 1

## 2021-06-04 MED ORDER — DOCUSATE SODIUM 100 MG PO CAPS
100.0000 mg | ORAL_CAPSULE | Freq: Two times a day (BID) | ORAL | Status: DC
  Administered 2021-06-07 – 2021-06-08 (×4): 100 mg via ORAL
  Filled 2021-06-04 (×7): qty 1

## 2021-06-04 MED ORDER — VANCOMYCIN HCL 1000 MG IV SOLR
INTRAVENOUS | Status: DC | PRN
  Administered 2021-06-04: 1000 mg

## 2021-06-04 MED ORDER — METHOCARBAMOL 500 MG PO TABS
500.0000 mg | ORAL_TABLET | Freq: Four times a day (QID) | ORAL | Status: DC | PRN
  Administered 2021-06-07: 500 mg via ORAL
  Filled 2021-06-04: qty 1

## 2021-06-04 MED ORDER — POVIDONE-IODINE 10 % EX SWAB
2.0000 "application " | Freq: Once | CUTANEOUS | Status: AC
  Administered 2021-06-04: 2 via TOPICAL

## 2021-06-04 MED ORDER — ROCURONIUM BROMIDE 10 MG/ML (PF) SYRINGE
PREFILLED_SYRINGE | INTRAVENOUS | Status: DC | PRN
  Administered 2021-06-04: 50 mg via INTRAVENOUS
  Administered 2021-06-04: 20 mg via INTRAVENOUS

## 2021-06-04 MED ORDER — HYDROCODONE-ACETAMINOPHEN 7.5-325 MG PO TABS: 1.0000 | ORAL_TABLET | ORAL | Status: DC | PRN

## 2021-06-04 MED ORDER — ACETAMINOPHEN 500 MG PO TABS
500.0000 mg | ORAL_TABLET | Freq: Four times a day (QID) | ORAL | Status: AC
  Filled 2021-06-04 (×2): qty 1

## 2021-06-04 MED ORDER — CHLORHEXIDINE GLUCONATE 4 % EX LIQD: 60.0000 mL | Freq: Once | CUTANEOUS | Status: DC

## 2021-06-04 MED ORDER — VANCOMYCIN HCL 1000 MG IV SOLR
INTRAVENOUS | Status: AC
  Filled 2021-06-04: qty 20

## 2021-06-04 MED ORDER — TRANEXAMIC ACID-NACL 1000-0.7 MG/100ML-% IV SOLN
INTRAVENOUS | Status: AC
  Filled 2021-06-04: qty 100

## 2021-06-04 MED ORDER — METHOCARBAMOL 1000 MG/10ML IJ SOLN: 500.0000 mg | Freq: Four times a day (QID) | INTRAVENOUS | Status: DC | PRN

## 2021-06-04 MED ORDER — FENTANYL CITRATE (PF) 100 MCG/2ML IJ SOLN
INTRAMUSCULAR | Status: AC
  Filled 2021-06-04: qty 2

## 2021-06-04 MED ORDER — ACETAMINOPHEN 160 MG/5ML PO SOLN: 1000.0000 mg | Freq: Once | ORAL | Status: DC | PRN

## 2021-06-04 MED ORDER — CHLORHEXIDINE GLUCONATE 0.12 % MT SOLN
OROMUCOSAL | Status: AC
  Administered 2021-06-04: 15 mL
  Filled 2021-06-04: qty 15

## 2021-06-04 MED ORDER — HYDROCODONE-ACETAMINOPHEN 5-325 MG PO TABS: 1.0000 | ORAL_TABLET | Freq: Four times a day (QID) | ORAL | Status: DC | PRN

## 2021-06-04 MED ORDER — ENOXAPARIN SODIUM 40 MG/0.4ML IJ SOSY
40.0000 mg | PREFILLED_SYRINGE | INTRAMUSCULAR | Status: DC
  Administered 2021-06-06 – 2021-06-09 (×4): 40 mg via SUBCUTANEOUS
  Filled 2021-06-04 (×5): qty 0.4

## 2021-06-04 MED ORDER — PROPOFOL 10 MG/ML IV BOLUS
INTRAVENOUS | Status: DC | PRN
  Administered 2021-06-04: 100 mg via INTRAVENOUS

## 2021-06-04 MED ORDER — PHENYLEPHRINE HCL-NACL 20-0.9 MG/250ML-% IV SOLN
INTRAVENOUS | Status: DC | PRN
  Administered 2021-06-04: 25 ug/min via INTRAVENOUS

## 2021-06-04 MED ORDER — TRANEXAMIC ACID-NACL 1000-0.7 MG/100ML-% IV SOLN
1000.0000 mg | INTRAVENOUS | Status: AC
  Administered 2021-06-04: 1000 mg via INTRAVENOUS

## 2021-06-04 MED ORDER — MENTHOL 3 MG MT LOZG: 1.0000 | LOZENGE | OROMUCOSAL | Status: DC | PRN

## 2021-06-04 MED ORDER — ONDANSETRON HCL 4 MG/2ML IJ SOLN
4.0000 mg | Freq: Four times a day (QID) | INTRAMUSCULAR | Status: DC | PRN
  Administered 2021-06-04: 4 mg via INTRAVENOUS
  Filled 2021-06-04: qty 2

## 2021-06-04 MED ORDER — ACETAMINOPHEN 500 MG PO TABS: 1000.0000 mg | ORAL_TABLET | Freq: Once | ORAL | Status: DC | PRN

## 2021-06-04 MED ORDER — CEFAZOLIN SODIUM-DEXTROSE 2-4 GM/100ML-% IV SOLN
2.0000 g | Freq: Four times a day (QID) | INTRAVENOUS | Status: AC
  Administered 2021-06-04 – 2021-06-05 (×2): 2 g via INTRAVENOUS
  Filled 2021-06-04 (×2): qty 100

## 2021-06-04 MED ORDER — ACETAMINOPHEN 10 MG/ML IV SOLN
1000.0000 mg | Freq: Once | INTRAVENOUS | Status: DC | PRN
  Administered 2021-06-04: 1000 mg via INTRAVENOUS

## 2021-06-04 SURGICAL SUPPLY — 42 items
BAG COUNTER SPONGE SURGICOUNT (BAG) ×2 IMPLANT
BIT DRILL CALIBRATED 4.2 (BIT) IMPLANT
BIT DRILL CANN 16 HIP (BIT) ×1 IMPLANT
BIT DRILL CANN STP 6/9 HIP (BIT) ×1 IMPLANT
CHLORAPREP W/TINT 26 (MISCELLANEOUS) ×2 IMPLANT
CLSR STERI-STRIP ANTIMIC 1/2X4 (GAUZE/BANDAGES/DRESSINGS) ×1 IMPLANT
COVER PERINEAL POST (MISCELLANEOUS) ×2 IMPLANT
COVER SURGICAL LIGHT HANDLE (MISCELLANEOUS) ×2 IMPLANT
DERMABOND ADVANCED (GAUZE/BANDAGES/DRESSINGS) ×2
DERMABOND ADVANCED .7 DNX12 (GAUZE/BANDAGES/DRESSINGS) ×2 IMPLANT
DRAPE C-ARM 42X72 X-RAY (DRAPES) ×2 IMPLANT
DRAPE C-ARMOR (DRAPES) ×2 IMPLANT
DRAPE STERI IOBAN 125X83 (DRAPES) ×2 IMPLANT
DRILL BIT CALIBRATED 4.2 (BIT) ×2
DRSG AQUACEL AG ADV 3.5X14 (GAUZE/BANDAGES/DRESSINGS) ×1 IMPLANT
ELECT REM PT RETURN 9FT ADLT (ELECTROSURGICAL) ×2
ELECTRODE REM PT RTRN 9FT ADLT (ELECTROSURGICAL) ×1 IMPLANT
GLOVE BIOGEL PI IND STRL 8 (GLOVE) ×2 IMPLANT
GLOVE BIOGEL PI INDICATOR 8 (GLOVE) ×2
GLOVE ECLIPSE 7.5 STRL STRAW (GLOVE) ×4 IMPLANT
GLOVE SURG ORTHO LTX SZ7.5 (GLOVE) ×4 IMPLANT
GOWN STRL REUS W/ TWL LRG LVL3 (GOWN DISPOSABLE) ×1 IMPLANT
GOWN STRL REUS W/ TWL XL LVL3 (GOWN DISPOSABLE) ×1 IMPLANT
GOWN STRL REUS W/TWL LRG LVL3 (GOWN DISPOSABLE) ×1
GOWN STRL REUS W/TWL XL LVL3 (GOWN DISPOSABLE) ×1
GUIDEWIRE 3.2X400 (WIRE) ×2 IMPLANT
KIT BASIN OR (CUSTOM PROCEDURE TRAY) ×2 IMPLANT
NAIL TROCH FIX 11X235 LT 130 (Nail) ×1 IMPLANT
NS IRRIG 1000ML POUR BTL (IV SOLUTION) ×2 IMPLANT
PACK GENERAL/GYN (CUSTOM PROCEDURE TRAY) ×2 IMPLANT
PAD ARMBOARD 7.5X6 YLW CONV (MISCELLANEOUS) ×4 IMPLANT
SCREW FENES TFNA 100 ST (Screw) ×1 IMPLANT
SCREW LOCK IM TI 5X30 (Screw) ×1 IMPLANT
SPONGE T-LAP 18X18 ~~LOC~~+RFID (SPONGE) ×4 IMPLANT
SUT MNCRL+ AB 3-0 CT1 36 (SUTURE) ×2 IMPLANT
SUT MON AB 2-0 CT1 36 (SUTURE) ×4 IMPLANT
SUT MONOCRYL AB 3-0 CT1 36IN (SUTURE) ×2
SUT PDS AB 1 CT  36 (SUTURE) ×2
SUT PDS AB 1 CT 36 (SUTURE) ×2 IMPLANT
SUT VIC AB 0 CT1 27 (SUTURE) ×2
SUT VIC AB 0 CT1 27XBRD ANBCTR (SUTURE) ×2 IMPLANT
TOWEL GREEN STERILE (TOWEL DISPOSABLE) ×4 IMPLANT

## 2021-06-04 NOTE — Anesthesia Preprocedure Evaluation (Signed)
Anesthesia Evaluation  Patient identified by MRN, date of birth, ID band Patient awake    Reviewed: Allergy & Precautions, NPO status , Patient's Chart, lab work & pertinent test results  Airway Mallampati: II  TM Distance: >3 FB Neck ROM: Full    Dental no notable dental hx.    Pulmonary neg pulmonary ROS,    Pulmonary exam normal breath sounds clear to auscultation       Cardiovascular METS: 3 - Mets hypertension, Normal cardiovascular exam Rhythm:Regular Rate:Normal  H/o AAA s/p repair in 2016   Neuro/Psych negative neurological ROS  negative psych ROS   GI/Hepatic negative GI ROS, Neg liver ROS,   Endo/Other  Raynaud's  Renal/GU negative Renal ROS  negative genitourinary   Musculoskeletal  (+) Arthritis , Osteoarthritis,    Abdominal   Peds negative pediatric ROS (+)  Hematology negative hematology ROS (+)   Anesthesia Other Findings   Reproductive/Obstetrics                             Anesthesia Physical  Anesthesia Plan  ASA: 3  Anesthesia Plan: Regional   Post-op Pain Management:  Regional for Post-op pain and Regional block*   Induction: Intravenous  PONV Risk Score and Plan: 2 and Treatment may vary due to age or medical condition  Airway Management Planned:   Additional Equipment: None  Intra-op Plan:   Post-operative Plan:   Informed Consent: I have reviewed the patients History and Physical, chart, labs and discussed the procedure including the risks, benefits and alternatives for the proposed anesthesia with the patient or authorized representative who has indicated his/her understanding and acceptance.       Plan Discussed with:   Anesthesia Plan Comments: ( )        Anesthesia Quick Evaluation

## 2021-06-04 NOTE — Anesthesia Procedure Notes (Signed)
Procedure Name: Intubation Date/Time: 06/04/2021 1:30 PM Performed by: Minerva Ends, CRNA Pre-anesthesia Checklist: Patient identified, Emergency Drugs available, Suction available and Patient being monitored Patient Re-evaluated:Patient Re-evaluated prior to induction Oxygen Delivery Method: Circle system utilized Preoxygenation: Pre-oxygenation with 100% oxygen Induction Type: IV induction Laryngoscope Size: Mac and 3 Grade View: Grade I Tube type: Oral Tube size: 6.5 mm Number of attempts: 1 Airway Equipment and Method: Stylet and Oral airway Placement Confirmation: ETT inserted through vocal cords under direct vision, positive ETCO2 and breath sounds checked- equal and bilateral Secured at: 21 cm Tube secured with: Tape Dental Injury: Teeth and Oropharynx as per pre-operative assessment

## 2021-06-04 NOTE — Progress Notes (Addendum)
  Transition of Care [facility]) Screening Note   Patient Details  Name: Stephanie Collier Date of Birth: 1935-02-13   Transition of Care [facility]) CM/SW Contact:    Epifanio Lesches, RN Phone Number: [phone] 06/04/2021, 8:16 AM       Pt while cooking fell and suffered L femur  fracture. From home with family. Ortho consult pending...  Plan : hip fx repair Consult received for SNF placement. TOC team will f/u post surgical procedure.  Transition of Care Department [facility]) has reviewed patient and following for TOC needs. We will continue to monitor patient advancement through interdisciplinary progression rounds. If new patient transition needs arise, please place a TOC consult.

## 2021-06-04 NOTE — Anesthesia Procedure Notes (Signed)
Anesthesia Regional Block: Peng block   Pre-Anesthetic Checklist: , timeout performed,  Correct Patient, Correct Site, Correct Laterality,  Correct Procedure, Correct Position, site marked,  Risks and benefits discussed,  Surgical consent,  Pre-op evaluation,  At surgeon's request and post-op pain management  Laterality: Left and Lower  Prep: chloraprep       Needles:  Injection technique: Single-shot      Needle Length: 9cm  Needle Gauge: 22     Additional Needles: Arrow StimuQuik ECHO Echogenic Stimulating PNB Needle  Procedures:,,,, ultrasound used (permanent image in chart),,    Narrative:  Start time: 06/04/2021 12:50 PM End time: 06/04/2021 12:58 PM Injection made incrementally with aspirations every 5 mL.  Performed by: Personally  Anesthesiologist: Val Eagle, MD

## 2021-06-04 NOTE — Anesthesia Preprocedure Evaluation (Signed)
Anesthesia Evaluation  Patient identified by MRN, date of birth, ID band Patient awake    Reviewed: Allergy & Precautions, NPO status , Patient's Chart, lab work & pertinent test results  Airway Mallampati: II  TM Distance: >3 FB Neck ROM: Full    Dental  (+) Dental Advisory Given   Pulmonary neg pulmonary ROS,    breath sounds clear to auscultation       Cardiovascular hypertension, (-) angina Rhythm:Regular  H/o AAA s/p repair in 2016   Neuro/Psych negative neurological ROS  negative psych ROS   GI/Hepatic negative GI ROS, Neg liver ROS,   Endo/Other  Raynaud's  Renal/GU negative Renal ROSLab Results      Component                Value               Date                      CREATININE               0.52                06/03/2021                Musculoskeletal  (+) Arthritis , Osteoarthritis,    Abdominal   Peds negative pediatric ROS (+)  Hematology  (+) Blood dyscrasia, anemia , Lab Results      Component                Value               Date                      WBC                      10.2                06/03/2021                HGB                      11.6 (L)            06/03/2021                HCT                      36.6                06/03/2021                MCV                      95.1                06/03/2021                PLT                      145 (L)             06/03/2021              Anesthesia Other Findings   Reproductive/Obstetrics  Anesthesia Physical Anesthesia Plan  ASA: 3  Anesthesia Plan: General   Post-op Pain Management: Regional block*   Induction: Intravenous  PONV Risk Score and Plan: 3 and Ondansetron and Dexamethasone  Airway Management Planned: Oral ETT  Additional Equipment: None  Intra-op Plan:   Post-operative Plan: Extubation in OR  Informed Consent: I have reviewed the patients History and  Physical, chart, labs and discussed the procedure including the risks, benefits and alternatives for the proposed anesthesia with the patient or authorized representative who has indicated his/her understanding and acceptance.     Dental advisory given  Plan Discussed with: CRNA  Anesthesia Plan Comments:         Anesthesia Quick Evaluation

## 2021-06-04 NOTE — Consult Note (Signed)
Reason for Consult:Left hip fx Referring Physician: Maurilio Lovely Time called: 0762 Time at bedside: 0956   Stephanie Collier is an 86 y.o. female.  HPI: Stephanie Collier was at home making dinner. She heard something pop on the stove, turned around quickly, lost her balance and fell. She had immediate left hip pain and could not get up. She was brought to the ED where x-rays showed a left hip fx and orthopedic surgery was consulted. She lives with her daughter and uses a RW to ambulate.  Past Medical History:  Diagnosis Date   AAA (abdominal aortic aneurysm) (HCC)    Arthritis    hip   Hypertension    no meds now   Osteoporosis    Patellar fracture    left   Raynaud's disease    Scoliosis     Past Surgical History:  Procedure Laterality Date   APPENDECTOMY     at age 26   BREAST BIOPSY Left    CHEST TUBE INSERTION Left 04/04/2014   Procedure: CHEST TUBE INSERTION;  Surgeon: Nada Libman, MD;  Location[facility];  Service: Vascular;  Laterality: Left;  by Dr. Collier Bullock   INSERTION OF VENA CAVA FILTER N/A 04/02/2014   Procedure: INSERTION OF VENA CAVA FILTER;  Surgeon: Nada Libman, MD;  Location: [facility] CATH LAB;  Service: Cardiovascular;  Laterality: N/A;   ORIF PATELLA Left 11/20/2020   Procedure: OPEN REDUCTION INTERNAL (ORIF) FIXATION PATELLA;  Surgeon: Joen Laura, MD;  Location: [facility];  Service: Orthopedics;  Laterality: Left;   PERIPHERAL VASCULAR CATHETERIZATION N/A 06/26/2014   Procedure: IVC Filter Removal;  Surgeon: Nada Libman, MD;  Location: [facility] INVASIVE CV LAB;  Service: Cardiovascular;  Laterality: N/A;   THORACIC AORTIC ENDOVASCULAR STENT GRAFT N/A 04/04/2014   Procedure: THORACIC AORTIC ENDOVASCULAR STENT GRAFT;  Surgeon: Nada Libman, MD;  Location: [facility] OR;  Service: Vascular;  Laterality: N/A;    Family History  Problem Relation Age of Onset   Breast cancer Daughter 90    Social History:  reports that she has never smoked. She has never used  smokeless tobacco. She reports that she does not currently use alcohol. She reports that she does not use drugs.  Allergies:  Allergies  Allergen Reactions   Fentanyl Shortness Of Breath   Asa [Aspirin]     Per patient stomach cramps   Azithromycin     Other reaction(s): diarrhea   Fosamax [Alendronate]     Other reaction(s): indigestion   Labetalol Hcl     Other reaction(s): indigestion    Medications: I have reviewed the patient's current medications.  Results for orders placed or performed during the hospital encounter of 06/03/21 (from the past 48 hour(s))  CBC with Differential     Status: Abnormal   Collection Time: 06/03/21 10:42 PM  Result Value Ref Range   WBC 10.2 4.0 - 10.5 K/uL   RBC 3.85 (L) 3.87 - 5.11 MIL/uL   Hemoglobin 11.6 (L) 12.0 - 15.0 g/dL   HCT 26.3 33.5 - 45.6 %   MCV 95.1 80.0 - 100.0 fL   MCH 30.1 26.0 - 34.0 pg   MCHC 31.7 30.0 - 36.0 g/dL   RDW 25.6 38.9 - 37.3 %   Platelets 145 (L) 150 - 400 K/uL   nRBC 0.0 0.0 - 0.2 %   Neutrophils Relative % 86 %   Neutro Abs 8.7 (H) 1.7 - 7.7 K/uL   Lymphocytes Relative 9 %   Lymphs  Abs 0.9 0.7 - 4.0 K/uL   Monocytes Relative 5 %   Monocytes Absolute 0.5 0.1 - 1.0 K/uL   Eosinophils Relative 0 %   Eosinophils Absolute 0.0 0.0 - 0.5 K/uL   Basophils Relative 0 %   Basophils Absolute 0.0 0.0 - 0.1 K/uL   Immature Granulocytes 0 %   Abs Immature Granulocytes 0.04 0.00 - 0.07 K/uL    Comment: Performed at [facility], [address], [place], [place] [address]  Basic metabolic panel     Status: Abnormal   Collection Time: 06/03/21 10:42 PM  Result Value Ref Range   Sodium 135 135 - 145 mmol/L   Potassium 3.8 3.5 - 5.1 mmol/L   Chloride 103 98 - 111 mmol/L   CO2 25 22 - 32 mmol/L   Glucose, Bld 113 (H) 70 - 99 mg/dL    Comment: Glucose reference range applies only to samples taken after fasting for at least 8 hours.   BUN 15 8 - 23 mg/dL   Creatinine, Ser 6.04 0.44 - 1.00 mg/dL   Calcium 8.3  (L) 8.9 - 10.3 mg/dL   GFR, Estimated >54 >09 mL/min    Comment: (NOTE) Calculated using the CKD-EPI Creatinine Equation (2021)    Anion gap 7 5 - 15    Comment: Performed at [facility], [address], [place], [place] [address]  CK     Status: None   Collection Time: 06/03/21 10:42 PM  Result Value Ref Range   Total CK 61 38 - 234 U/L    Comment: Performed at [facility], [address], [place], [place] [address]  Hepatic function panel     Status: Abnormal   Collection Time: 06/03/21 10:42 PM  Result Value Ref Range   Total Protein 5.9 (L) 6.5 - 8.1 g/dL   Albumin 3.3 (L) 3.5 - 5.0 g/dL   AST 21 15 - 41 U/L   ALT 12 0 - 44 U/L   Alkaline Phosphatase 67 38 - 126 U/L   Total Bilirubin 1.2 0.3 - 1.2 mg/dL   Bilirubin, Direct 0.1 0.0 - 0.2 mg/dL   Indirect Bilirubin 1.1 (H) 0.3 - 0.9 mg/dL    Comment: Performed at [facility] Lab, [address], [place], [place] [address]  Magnesium     Status: None   Collection Time: 06/03/21 10:42 PM  Result Value Ref Range   Magnesium 1.8 1.7 - 2.4 mg/dL    Comment: Performed at [facility], [address], [place], [place] [address]  Phosphorus     Status: None   Collection Time: 06/03/21 10:42 PM  Result Value Ref Range   Phosphorus 3.2 2.5 - 4.6 mg/dL    Comment: Performed at [facility], [address], [place], [place] [address]  Troponin I (High Sensitivity)     Status: None   Collection Time: 06/03/21 10:42 PM  Result Value Ref Range   Troponin I (High Sensitivity) 9 <18 ng/L    Comment: (NOTE) Elevated high sensitivity troponin I (hsTnI) values and significant  changes across serial measurements may suggest ACS but many other  chronic and acute conditions are known to elevate hsTnI results.  Refer to the "Links" section for chest pain algorithms and additional  guidance. Performed at [facility] Lab, [address], [place], [place] [address]   TSH     Status: None   Collection Time:  06/03/21 11:58 PM  Result  Value Ref Range   TSH 4.276 0.350 - 4.500 uIU/mL    Comment: Performed by a 3rd Generation assay with a functional sensitivity of <=0.01 uIU/mL. Performed at [facility], [address] [place], [place] [address]   Type and screen     Status: None   Collection Time: 06/04/21  2:45 AM  Result Value Ref Range   ABO/RH(D) O POS    Antibody Screen NEG    Sample Expiration      06/07/2021,2359 Performed at [facility], [address] [place], [place] [address]   Vitamin B12     Status: Abnormal   Collection Time: 06/04/21  2:46 AM  Result Value Ref Range   Vitamin B-12 178 (L) 180 - 914 pg/mL    Comment: (NOTE) This assay is not validated for testing neonatal or myeloproliferative syndrome specimens for Vitamin B12 levels. Performed at [facility], [address] [place], [place] [address]   Folate     Status: None   Collection Time: 06/04/21  2:46 AM  Result Value Ref Range   Folate 21.9 >5.9 ng/mL    Comment: Performed at [facility], [address] [place], [place] [address]  Iron and TIBC     Status: Abnormal   Collection Time: 06/04/21  2:46 AM  Result Value Ref Range   Iron 19 (L) 28 - 170 ug/dL   TIBC 295234 (L) 621250 - 308450 ug/dL   Saturation Ratios 8 (L) 10.4 - 31.8 %   UIBC 215 ug/dL    Comment: Performed at [facility], [address] [place], [place] [address]  Ferritin     Status: None   Collection Time: 06/04/21  2:46 AM  Result Value Ref Range   Ferritin 157 11 - 307 ng/mL    Comment: Performed at [facility], [address] [place], [place] [address]  Reticulocytes     Status: Abnormal   Collection Time: 06/04/21  2:46 AM  Result Value Ref Range   Retic Ct Pct 1.1 0.4 - 3.1 %   RBC. 3.51 (L) 3.87 - 5.11 MIL/uL   Retic Count, Absolute 37.6 19.0 - 186.0 K/uL   Immature Retic Fract 11.3 2.3 - 15.9 %    Comment: Performed at [facility], [address] [place], [place] [address]   Protime-INR     Status: None   Collection Time: 06/04/21  2:46 AM  Result Value Ref Range   Prothrombin Time 14.8 11.4 - 15.2 seconds   INR 1.2 0.8 - 1.2    Comment: (NOTE) INR goal varies based on device and disease states. Performed at [facility], [address] [place], [place] [address]   Albumin     Status: Abnormal   Collection Time: 06/04/21  2:46 AM  Result Value Ref Range   Albumin 3.2 (L) 3.5 - 5.0 g/dL    Comment: Performed at [facility]  Lab, [address] [place], [place] [address]  VITAMIN D 25 Hydroxy (Vit-D Deficiency, Fractures)     Status: Abnormal   Collection Time: 06/04/21  2:46 AM  Result Value Ref Range   Vit D, 25-Hydroxy 28.16 (L) 30 - 100 ng/mL    Comment: (NOTE) Vitamin D deficiency has been defined by the Institute of Medicine  and an Endocrine Society practice guideline as a level of serum 25-OH  vitamin D less than 20 ng/mL (1,2). The Endocrine Society went  on to  further define vitamin D insufficiency as a level between 21 and 29  ng/mL (2).  1. IOM (Institute of Medicine). 2010. Dietary reference intakes for  calcium and D. [place]: [employer]. 2. Holick MF, Binkley [place], Bischoff-Ferrari HA, et al. Evaluation,  treatment, and prevention of vitamin D deficiency: an Endocrine  Society clinical practice guideline, JCEM. 2011 Jul; 96(7): 1911-30.  Performed at [facility], [address] [place], [place] [address]     DG Knee 2 Views Left  Result Date: 06/03/2021 CLINICAL DATA:  Fall, left knee pain EXAM: LEFT KNEE - 1-2 VIEW COMPARISON:  None Available. FINDINGS: Osseous structures are diffusely osteopenic. Normal alignment. No acute fracture or dislocation. 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 Patellar ORIF has been performed with 2 partially threaded log tendon screws noted. There is thickening of the quadriceps tendon which may be post inflammatory or posttraumatic  in nature. Soft tissues are otherwise unremarkable. No effusion. IMPRESSION: No acute abnormality. Electronically Signed   By: Helyn Numbers M.D.   On: 06/03/2021 22:05   CT Head Wo Contrast  Result Date: 06/04/2021 CLINICAL DATA:  Larey Seat hit head, elderly EXAM: CT HEAD WITHOUT CONTRAST TECHNIQUE: Contiguous axial images were obtained from the base of the skull through the vertex without intravenous contrast. RADIATION DOSE REDUCTION: This exam was performed according to the departmental dose-optimization program which includes automated exposure control, adjustment of the mA and/or kV according to patient size and/or use of iterative reconstruction technique. COMPARISON:  11/13/2020 FINDINGS: Brain: No evidence of acute infarction, hemorrhage, cerebral edema, mass, mass effect, or midline shift. No hydrocephalus or extra-axial fluid collection. Periventricular white matter changes, likely the sequela of chronic small vessel ischemic disease. Degree of cerebral atrophy is within normal limits for age. Vascular: No hyperdense vessel. Atherosclerotic calcifications in the intracranial carotid and vertebral arteries. Skull: Normal. Negative for fracture or focal lesion. Sinuses/Orbits: No acute finding. Other: The mastoid air cells are well aerated. IMPRESSION: No acute intracranial process. Electronically Signed   By: Wiliam Ke M.D.   On: 06/04/2021 00:07   DG CHEST PORT 1 VIEW  Result Date: 06/04/2021 CLINICAL DATA:  Preop chest, hip fracture EXAM: PORTABLE CHEST 1 VIEW COMPARISON:  04/27/2017 FINDINGS: Lungs are clear.  No pleural effusion or pneumothorax. The heart is top-normal in size. Descending thoracic aortic stent graft. IMPRESSION: No evidence of acute cardiopulmonary disease. Electronically Signed   By: Charline Bills M.D.   On: 06/04/2021 00:16   DG Hip Unilat W or Wo Pelvis 2-3 Views Left  Result Date: 06/03/2021 CLINICAL DATA:  Fall, left hip pain EXAM: DG HIP (WITH OR WITHOUT PELVIS)  2-3V LEFT COMPARISON:  None Available. FINDINGS: Osseous structures are diffusely osteopenic. There is an acute, mildly comminuted intratrochanteric fracture of the left hip with override and external rotation of the distal fracture fragment. Femoral head appears seated within the left acetabulum. Left hip joint space is preserved. Limited evaluation of the right hip is unremarkable. Vascular calcifications are noted. IMPRESSION: Acute mildly comminuted left intratrochanteric hip fracture. Osteopenia. Electronically Signed   By: Helyn Numbers M.D.   On: 06/03/2021 22:06    Review of Systems  HENT:  Negative for ear discharge, ear pain, hearing loss and tinnitus.   Eyes:  Negative for photophobia and pain.  Respiratory:  Negative for cough and shortness of breath.   Cardiovascular:  Negative for chest pain.  Gastrointestinal:  Negative for abdominal pain, nausea and  vomiting.  Genitourinary:  Negative for dysuria, flank pain, frequency and urgency.  Musculoskeletal:  Positive for arthralgias (Left hip and knee). Negative for back pain, myalgias and neck pain.  Neurological:  Negative for dizziness and headaches.  Hematological:  Does not bruise/bleed easily.  Psychiatric/Behavioral:  The patient is not nervous/anxious.   Blood pressure (!) 160/58, pulse 64, temperature 98.3 F (36.8 C), temperature source Oral, resp. rate (!) 21, height 5' (1.524 m), weight 49.9 kg, SpO2 98 %. Physical Exam Constitutional:      General: She is not in acute distress.    Appearance: She is well-developed. She is not diaphoretic.  HENT:     Head: Normocephalic and atraumatic.  Eyes:     General: No scleral icterus.       Right eye: No discharge.        Left eye: No discharge.     Conjunctiva/sclera: Conjunctivae normal.  Cardiovascular:     Rate and Rhythm: Normal rate and regular rhythm.  Pulmonary:     Effort: Pulmonary effort is normal. No respiratory distress.  Musculoskeletal:     Cervical back:  Normal range of motion.     Comments: LLE No traumatic wounds, ecchymosis, or rash  Mod TTP hip and knee  No knee or ankle effusion  Knee stable to varus/ valgus and anterior/posterior stress  Sens DPN, SPN, TN intact  Motor EHL, ext, flex, evers 5/5  DP 1+, PT 1+, No significant edema  Skin:    General: Skin is warm and dry.  Neurological:     Mental Status: She is alert.  Psychiatric:        Mood and Affect: Mood normal.        Behavior: Behavior normal.    Assessment/Plan: Left hip fx -- Plan IMN today with Dr. Sherilyn Dacosta. Please keep NPO.    Freeman Caldron, PA-C Orthopedic Surgery [phone] 06/04/2021, 10:29 AM

## 2021-06-04 NOTE — Progress Notes (Signed)
PROGRESS NOTE    Stephanie Collier  KVQ:[id number] DOB: February 22, 1935 DOA: 06/03/2021 PCP: Marden Noble, MD   Brief Narrative:    Stephanie Collier is a 86 y.o. female with medical history significant of  Thoracic aortic aneurysm, COPD, and iliac artery aneurysm. Presented with a fall and is now noted to have a left nondisplaced intertrochanteric fracture.  She is noted to have a cardiac murmur with 2D echocardiogram pending.  Assessment & Plan:   Principal Problem:   Closed left hip fracture (HCC) Active Problems:   Prolonged QT interval   HTN (hypertension)   Aortic aneurysm, thoracic (HCC)   Cardiac murmur   Fall at home, initial encounter   Normocytic anemia  Assessment and Plan:  Prolonged QT interval - will monitor on tele avoid QT prolonging medications, rehydrate correct electrolytes     HTN (hypertension) Pt is no longer takes Cozaar Will readdress BP after pain is controlled   Aortic aneurysm, thoracic [facility]) Anesthesiology should be aware prior to OR of complex vascular history   Closed left hip fracture (HCC)  - management as per orthopedics,  plan to operate  in  a.m.     Keep nothing by mouth post midnight. Patient   not on anticoagulation or antiplatelet agents   Ordered type and screen,  order a vitamin D level   Patient at baseline   able to walk   100 feet  But does get joint pain that limits her   Patient denies any chest pain or shortness of breath currently and/or with exertion,    ECG showing LVH and prolonged QTC  no known history of coronary artery disease,  But has mild COPD  On exam noted cardiac murmur Given advanced age patient is at least moderate  Risk    will order echo repeat ECG in AM If ECHO is severely abnormal may need Cardiology consult for further pre-op clearance       Cardiac murmur Order echogram, currently pending   Fall at home, initial encounter Given pt hitting her head CT head has been ordered and still pending PT OT eval prior to  dc     DVT prophylaxis: SCDs Code Status: Full Family Communication: None at bedside Disposition Plan:  Status is: Inpatient Remains inpatient appropriate because: Needs IV medications and surgery   Consultants:  Orthopedics  Procedures:  See below  Antimicrobials:  None  Subjective: Patient seen and evaluated today with ongoing left-sided hip pain. No acute concerns or events noted overnight.  Objective: Vitals:   06/04/21 0326 06/04/21 0424 06/04/21 0525 06/04/21 0814  BP: (!) 164/55 (!) 161/58 (!) 159/58 (!) 160/58  Pulse: 60 62 63 64  Resp: 20 20 20  (!) 21  Temp:    98.3 F (36.8 C)  TempSrc:    Oral  SpO2: 97% 99% 98% 98%  Weight:      Height:       No intake or output data in the 24 hours ending 06/04/21 0848 Filed Weights   06/03/21 2112  Weight: 49.9 kg    Examination:  General exam: Appears calm and comfortable  Respiratory system: Clear to auscultation. Respiratory effort normal. Cardiovascular system: S1 & S2 heard, RRR.  Gastrointestinal system: Abdomen is soft Central nervous system: Alert and awake Extremities: No edema Skin: No significant lesions noted Psychiatry: Flat affect.    Data Reviewed: I have personally reviewed following labs and imaging studies  CBC: Recent Labs  Lab 06/03/21 2242  WBC 10.2  NEUTROABS 8.7*  HGB 11.6*  HCT 36.6  MCV 95.1  PLT 145*   Basic Metabolic Panel: Recent Labs  Lab 06/03/21 2242  NA 135  K 3.8  CL 103  CO2 25  GLUCOSE 113*  BUN 15  CREATININE 0.52  CALCIUM 8.3*  MG 1.8  PHOS 3.2   GFR: Estimated Creatinine Clearance: 37.6 mL/min (by C-G formula based on SCr of 0.52 mg/dL). Liver Function Tests: Recent Labs  Lab 06/03/21 2242 06/04/21 0246  AST 21  --   ALT 12  --   ALKPHOS 67  --   BILITOT 1.2  --   PROT 5.9*  --   ALBUMIN 3.3* 3.2*   No results for input(s): LIPASE, AMYLASE in the last 168 hours. No results for input(s): AMMONIA in the last 168 hours. Coagulation  Profile: Recent Labs  Lab 06/04/21 0246  INR 1.2   Cardiac Enzymes: Recent Labs  Lab 06/03/21 2242  CKTOTAL 61   BNP (last 3 results) No results for input(s): PROBNP in the last 8760 hours. HbA1C: No results for input(s): HGBA1C in the last 72 hours. CBG: No results for input(s): GLUCAP in the last 168 hours. Lipid Profile: No results for input(s): CHOL, HDL, LDLCALC, TRIG, CHOLHDL, LDLDIRECT in the last 72 hours. Thyroid Function Tests: Recent Labs    06/03/21 2358  TSH 4.276   Anemia Panel: Recent Labs    06/04/21 0246  VITAMINB12 178*  FOLATE 21.9  FERRITIN 157  TIBC 234*  IRON 19*  RETICCTPCT 1.1   Sepsis Labs: No results for input(s): PROCALCITON, LATICACIDVEN in the last 168 hours.  No results found for this or any previous visit (from the past 240 hour(s)).       Radiology Studies: DG Knee 2 Views Left  Result Date: 06/03/2021 CLINICAL DATA:  Fall, left knee pain EXAM: LEFT KNEE - 1-2 VIEW COMPARISON:  None Available. FINDINGS: Osseous structures are diffusely osteopenic. Normal alignment. No acute fracture or dislocation. 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 Patellar ORIF has been performed with 2 partially threaded log tendon screws noted. There is thickening of the quadriceps tendon which may be post inflammatory or posttraumatic in nature. Soft tissues are otherwise unremarkable. No effusion. IMPRESSION: No acute abnormality. Electronically Signed   By: Helyn Numbers M.D.   On: 06/03/2021 22:05   CT Head Wo Contrast  Result Date: 06/04/2021 CLINICAL DATA:  Larey Seat hit head, elderly EXAM: CT HEAD WITHOUT CONTRAST TECHNIQUE: Contiguous axial images were obtained from the base of the skull through the vertex without intravenous contrast. RADIATION DOSE REDUCTION: This exam was performed according to the departmental dose-optimization program which includes automated exposure control, adjustment of the mA and/or kV  according to patient size and/or use of iterative reconstruction technique. COMPARISON:  11/13/2020 FINDINGS: Brain: No evidence of acute infarction, hemorrhage, cerebral edema, mass, mass effect, or midline shift. No hydrocephalus or extra-axial fluid collection. Periventricular white matter changes, likely the sequela of chronic small vessel ischemic disease. Degree of cerebral atrophy is within normal limits for age. Vascular: No hyperdense vessel. Atherosclerotic calcifications in the intracranial carotid and vertebral arteries. Skull: Normal. Negative for fracture or focal lesion. Sinuses/Orbits: No acute finding. Other: The mastoid air cells are well aerated. IMPRESSION: No acute intracranial process. Electronically Signed   By: Wiliam Ke M.D.   On: 06/04/2021 00:07   DG CHEST PORT 1 VIEW  Result Date: 06/04/2021 CLINICAL DATA:  Preop chest, hip fracture EXAM: PORTABLE CHEST 1 VIEW COMPARISON:  04/27/2017 FINDINGS:  Lungs are clear.  No pleural effusion or pneumothorax. The heart is top-normal in size. Descending thoracic aortic stent graft. IMPRESSION: No evidence of acute cardiopulmonary disease. Electronically Signed   By: Charline BillsSriyesh  Krishnan M.D.   On: 06/04/2021 00:16   DG Hip Unilat W or Wo Pelvis 2-3 Views Left  Result Date: 06/03/2021 CLINICAL DATA:  Fall, left hip pain EXAM: DG HIP (WITH OR WITHOUT PELVIS) 2-3V LEFT COMPARISON:  None Available. FINDINGS: Osseous structures are diffusely osteopenic. There is an acute, mildly comminuted intratrochanteric fracture of the left hip with override and external rotation of the distal fracture fragment. Femoral head appears seated within the left acetabulum. Left hip joint space is preserved. Limited evaluation of the right hip is unremarkable. Vascular calcifications are noted. IMPRESSION: Acute mildly comminuted left intratrochanteric hip fracture. Osteopenia. Electronically Signed   By: Helyn NumbersAshesh  Parikh M.D.   On: 06/03/2021 22:06         Scheduled Meds:  Continuous Infusions:  sodium chloride 75 mL/hr at 06/04/21 0149   methocarbamol (ROBAXIN) IV       LOS: 1 day    Time spent: 35 minutes    Cortnie Ringel Hoover Brunette Quetzal Meany, DO [facility]  If 7PM-7AM, please contact night-coverage [email or web address] 06/04/2021, 8:48 AM

## 2021-06-04 NOTE — Assessment & Plan Note (Signed)
No indication for transfusion at this Time check anemia panel for evaluation of B12 and folate given severe fatigue.

## 2021-06-04 NOTE — Progress Notes (Signed)
SLP Cancellation Note  Patient Details Name: Stephanie Collier MRN: [id number] DOB: [birth year]   Cancelled treatment:       Reason Eval/Treat Not Completed: Patient at procedure or test/unavailable. Tentative plan for surgery, time is pending and pt is NPO. Will defer eval until after surgery.    Wessley Emert, Riley Nearing 06/04/2021, 8:57 AM

## 2021-06-04 NOTE — Plan of Care (Signed)
Problem: Clinical Measurements: Goal: Ability to maintain clinical measurements within normal limits will improve Outcome: Progressing Goal: Diagnostic test results will improve Outcome: Progressing Goal: Respiratory complications will improve Outcome: Progressing   Problem: Activity: Goal: Risk for activity intolerance will decrease Outcome: Progressing   Problem: Elimination: Goal: Will not experience complications related to bowel motility Outcome: Progressing   Problem: Pain Managment: Goal: General experience of comfort will improve Outcome: Progressing   Problem: Safety: Goal: Ability to remain free from injury will improve Outcome: Progressing   Problem: Skin Integrity: Goal: Risk for impaired skin integrity will decrease Outcome: Progressing   

## 2021-06-04 NOTE — Consult Note (Signed)
Orthopaedic Consult  Date/Time: 06/04/21 12:56 PM  Patient Name: Stephanie Collier  Attending Physician: Rodena Goldmann, DO    ASSESSMENT & PLAN  Orthopaedic Assessment: 86 y.o. female with left intertrochanteric hip fracture after a mechanical fall.  Reductions/Procedures/Splinting/Anesthesia Performed: Reductions: None Splinting/casting: None Procedure(s): None Anesthesia: N/A  Plan: Given the patient's previous ambulatory status and level of function and the fact that she was living independently and her desire to return to her previous level of function, she recommended to proceed with fixation of the intertrochanteric fracture.  Risks of the proposed surgical treatment were discussed with the patient, including bleeding, wound healing complications, infection, damage to surrounding structures, persistent pain, stiffness, lack of improvement, potential for subsequent arthritis or worsening of pre-existing arthritis, nonunion, malunion, and need for further surgery, as well as complications related to anesthesia, cardiovascular complications, and death.  All questions were answered to the patient's satisfaction.  She understands all of this and wishes to proceed with surgery.     Georgeanna Harrison M.D. Orthopaedic Surgery [facility]   Medical Decision Making  Amount/complexity of data: Is there a pathologic fracture (e.g. neoplastic, osteoporotic insufficiency fracture)? Yes Independent interpretation of radiographic studies: Yes Review of radiology results (e.g. reports): Yes Tests ordered (e.g. additional radiographic studies, labs): Yes Lab results reviewed: Yes Reviewed old records: Yes History from another source (independent historian, e.g. family/friend/etc.): No Risk: Patient receiving IV controlled substances for pain: Yes Fracture requiring manipulation: No Urgent or emergent (non-elective) surgery likely this admission: Yes Presence of medical  comorbidities and/or surgical risk factors (e.g. current smoker, CAD, diabetes, COPD, CKD, etc.): Yes Closed fracture management WITHOUT manipulation: No Urgent minor procedure (e.g. joint aspiration, compartment pressure measurement, etc.): No Will likely need surgery as an outpatient: No     HPI Bedie Collier is a 86 y.o. female. Orthopaedic consultation has specifically been requested to address this patient's current musculoskeletal presentation. She sustained a fall and had immediate pain in the left hip/thigh.  Pain is sharp and severe.  Pain is worse movement and better with rest and immobilization.  Since the fall the patient was not able to ambulate or bear weight due to pain.  At baseline the patient is ambulatory with a walker.  She has recent history of ipsilateral patella fracture ORIF, and had been using a walker after that injury.  Otherwise she lives largely independently.   PMH Past Medical History:  Diagnosis Date   AAA (abdominal aortic aneurysm) (HCC)    Arthritis    hip   Hypertension    no meds now   Osteoporosis    Patellar fracture    left   Raynaud's disease    Scoliosis      PSH Past Surgical History:  Procedure Laterality Date   APPENDECTOMY     at age 33   BREAST BIOPSY Left    CHEST TUBE INSERTION Left 04/04/2014   Procedure: CHEST TUBE INSERTION;  Surgeon: Serafina Mitchell, MD;  Location: [facility];  Service: Vascular;  Laterality: Left;  by Dr. Vernetta Honey   INSERTION OF VENA CAVA FILTER N/A 04/02/2014   Procedure: INSERTION OF VENA CAVA FILTER;  Surgeon: Serafina Mitchell, MD;  Location: [facility] CATH LAB;  Service: Cardiovascular;  Laterality: N/A;   ORIF PATELLA Left 11/20/2020   Procedure: OPEN REDUCTION INTERNAL (ORIF) FIXATION PATELLA;  Surgeon: Willaim Sheng, MD;  Location: [facility];  Service: Orthopedics;  Laterality: Left;   PERIPHERAL VASCULAR CATHETERIZATION N/A 06/26/2014  Procedure: IVC Filter Removal;  Surgeon: Serafina Mitchell, MD;   Location: [facility] [place] CV LAB;  Service: Cardiovascular;  Laterality: N/A;   THORACIC AORTIC ENDOVASCULAR STENT GRAFT N/A 04/04/2014   Procedure: THORACIC AORTIC ENDOVASCULAR STENT GRAFT;  Surgeon: Serafina Mitchell, MD;  Location: [facility];  Service: Vascular;  Laterality: N/A;   Home Medications Prior to Admission medications   Medication Sig Start Date End Date Taking? Authorizing Provider  cholecalciferol (VITAMIN D) 25 MCG (1000 UNIT) tablet Take 1,000 Units by mouth daily.   Yes [provider]  Multiple Vitamin (MULTI-VITAMINS) TABS Take 1 tablet by mouth every other day.   Yes [provider]  apixaban (ELIQUIS) 2.5 MG TABS tablet Take 1 tablet (2.5 mg total) by mouth 2 (two) times daily. Patient not taking: Reported on 06/04/2021 11/20/20   Willaim Sheng, MD     Allergies Allergies  Allergen Reactions   Fentanyl Shortness Of Breath   Asa [Aspirin]     Per patient stomach cramps   Azithromycin     Other reaction(s): diarrhea   Fosamax [Alendronate]     Other reaction(s): indigestion   Labetalol Hcl     Other reaction(s): indigestion     Family History Family History  Problem Relation Age of Onset   Breast cancer Daughter 60    Social History Social History   Socioeconomic History   Marital status: Divorced    Spouse name: Not on file   Number of children: Not on file   Years of education: Not on file   Highest education level: Not on file  Occupational History   Not on file  Tobacco Use   Smoking status: Never   Smokeless tobacco: Never  Vaping Use   Vaping Use: Never used  Substance and Sexual Activity   Alcohol use: Not Currently    Comment: rare wine   Drug use: No   Sexual activity: Not Currently    Birth control/protection: Post-menopausal  Other Topics Concern   Not on file  Social History Narrative   Not on file   Social Determinants of Health   Financial Resource Strain: Not on file  Food Insecurity: Not on file   Transportation Needs: Not on file  Physical Activity: Not on file  Stress: Not on file  Social Connections: Not on file  Intimate Partner Violence: Not on file     Review of Systems MSK: As noted per HPI above GI: No current Nausea/vomiting ENT: Denies sore throat, epistaxis CV: Denies chest pain  Resp: No current shortness of breath  Other than mentioned above, there are no Constitutional, Neurological, Psychiatric, ENT, Ophthalmological, Cardiovascular, Respiratory, GI, GU, Musculoskeletal, Integumentary, Lymphatic, Endocrine or Allergic issues.     Imaging  Independent interpretation of orthopaedic-relevant films: Multiple radiographic views of the left hip and pelvis demonstrate left intertrochanteric fracture. AP and lateral views of the left knee demonstrate stable appearance of patella fracture s/p internal fixation with 2 screws.  No new fractures or acute osseous abnormalities appreciated.  Radiographic results: DG Knee 2 Views Left  Result Date: 06/03/2021 CLINICAL DATA:  Fall, left knee pain EXAM: LEFT KNEE - 1-2 VIEW COMPARISON:  None Available. FINDINGS: Osseous structures are diffusely osteopenic. Normal alignment. No acute fracture or dislocation. 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 Patellar ORIF has been performed with 2 partially threaded log tendon screws noted. There is thickening of the quadriceps tendon which may be post inflammatory or posttraumatic in nature. Soft tissues are otherwise  unremarkable. No effusion. IMPRESSION: No acute abnormality. Electronically Signed   By: Fidela Salisbury M.D.   On: 06/03/2021 22:05   CT Head Wo Contrast  Result Date: 06/04/2021 CLINICAL DATA:  [place] hit head, elderly EXAM: CT HEAD WITHOUT CONTRAST TECHNIQUE: Contiguous axial images were obtained from the base of the skull through the vertex without intravenous contrast. RADIATION DOSE REDUCTION: This exam was performed according to the  departmental dose-optimization program which includes automated exposure control, adjustment of the mA and/or kV according to patient size and/or use of iterative reconstruction technique. COMPARISON:  11/13/2020 FINDINGS: Brain: No evidence of acute infarction, hemorrhage, cerebral edema, mass, mass effect, or midline shift. No hydrocephalus or extra-axial fluid collection. Periventricular white matter changes, likely the sequela of chronic small vessel ischemic disease. Degree of cerebral atrophy is within normal limits for age. Vascular: No hyperdense vessel. Atherosclerotic calcifications in the intracranial carotid and vertebral arteries. Skull: Normal. Negative for fracture or focal lesion. Sinuses/Orbits: No acute finding. Other: The mastoid air cells are well aerated. IMPRESSION: No acute intracranial process. Electronically Signed   By: Merilyn Baba M.D.   On: 06/04/2021 00:07   DG CHEST PORT 1 VIEW  Result Date: 06/04/2021 CLINICAL DATA:  Preop chest, hip fracture EXAM: PORTABLE CHEST 1 VIEW COMPARISON:  04/27/2017 FINDINGS: Lungs are clear.  No pleural effusion or pneumothorax. The heart is top-normal in size. Descending thoracic aortic stent graft. IMPRESSION: No evidence of acute cardiopulmonary disease. Electronically Signed   By: Julian Hy M.D.   On: 06/04/2021 00:16   ECHOCARDIOGRAM COMPLETE  Result Date: 06/04/2021    ECHOCARDIOGRAM REPORT   Patient Name:   SERENIA SMARR Date of Exam: 06/04/2021 Medical Rec #:  [id number]  Height:       60.0 in Accession #:    [id number] Weight:       110.0 lb Date of Birth:  [birth year]  BSA:          1.448 m Patient Age:    33 years   BP:           155/55 mmHg Patient Gender: F          HR:           67 bpm. Exam Location:  Inpatient Procedure: 2D Echo, Color Doppler, Cardiac Doppler and Strain Analysis Indications:    murmur  History:        Patient has no prior history of Echocardiogram examinations.                 Signs/Symptoms:Murmur; Risk  Factors:Hypertension.  Sonographer:    Luisa Hart RDCS Referring Phys: [id number] ANASTASSIA DOUTOVA IMPRESSIONS  1. LV apical false tendon (normal variant). Left ventricular ejection fraction, by estimation, is 60 to 65%. Left ventricular ejection fraction by PLAX is 60 %. The left ventricle has normal function. The left ventricle has no regional wall motion abnormalities. Left ventricular diastolic parameters are consistent with Grade I diastolic dysfunction (impaired relaxation).  2. Right ventricular systolic function is normal. The right ventricular size is normal.  3. Left atrial size was mildly dilated.  4. The mitral valve is abnormal. Trivial mitral valve regurgitation.  5. The aortic valve is tricuspid. There is moderate calcification of the aortic valve. Aortic valve regurgitation is moderate. Mild to moderate aortic valve stenosis. Aortic regurgitation PHT measures 407 msec. Aortic valve area, by VTI measures 1.42 cm. Aortic valve mean gradient measures 8.0 mmHg. Aortic valve Vmax measures 1.98 m/s. Peak gradient 15.6 mmHg. Sharma Covert  is 0.45. Comparison(s): No prior Echocardiogram. FINDINGS  Left Ventricle: LV apical false tendon (normal variant). Left ventricular ejection fraction, by estimation, is 60 to 65%. Left ventricular ejection fraction by PLAX is 60 %. The left ventricle has normal function. The left ventricle has no regional wall  motion abnormalities. The left ventricular internal cavity size was normal in size. There is no left ventricular hypertrophy. Left ventricular diastolic parameters are consistent with Grade I diastolic dysfunction (impaired relaxation). Normal left ventricular filling pressure. Right Ventricle: The right ventricular size is normal. No increase in right ventricular wall thickness. Right ventricular systolic function is normal. Left Atrium: Left atrial size was mildly dilated. Right Atrium: Right atrial size was normal in size. Pericardium: There is no evidence of pericardial  effusion. Mitral Valve: The mitral valve is abnormal. There is mild thickening of the anterior and posterior mitral valve leaflet(s). Trivial mitral valve regurgitation. MV peak gradient, 5.1 mmHg. The mean mitral valve gradient is 2.0 mmHg. Tricuspid Valve: The tricuspid valve is grossly normal. Tricuspid valve regurgitation is trivial. Aortic Valve: The aortic valve is tricuspid. There is moderate calcification of the aortic valve. Aortic valve regurgitation is moderate. Aortic regurgitation PHT measures 407 msec. Mild to moderate aortic stenosis is present. Aortic valve mean gradient measures 8.0 mmHg. Aortic valve peak gradient measures 15.6 mmHg. Aortic valve area, by VTI measures 1.42 cm. Pulmonic Valve: The pulmonic valve was normal in structure. Pulmonic valve regurgitation is not visualized. Aorta: The aortic root and ascending aorta are structurally normal, with no evidence of dilitation. Venous: The inferior vena cava was not well visualized. IAS/Shunts: No atrial level shunt detected by color flow Doppler.  LEFT VENTRICLE PLAX 2D LV EF:         Left            Diastology                ventricular     LV e' medial:    4.67 cm/s                ejection        LV E/e' medial:  7.6                fraction by     LV e' lateral:   5.08 cm/s                PLAX is 60      LV E/e' lateral: 7.0                %. LVIDd:         5.00 cm LVIDs:         3.40 cm LV PW:         1.30 cm LV IVS:        1.00 cm LVOT diam:     2.00 cm LV SV:         58 LV SV Index:   40 LVOT Area:     3.14 cm  LV Volumes (MOD) LV vol d, MOD    82.7 ml A2C: LV vol d, MOD    83.3 ml A4C: LV vol s, MOD    30.3 ml A2C: LV vol s, MOD    38.2 ml A4C: LV SV MOD A2C:   52.4 ml LV SV MOD A4C:   83.3 ml LV SV MOD BP:    48.4 ml RIGHT VENTRICLE RV Basal diam:  3.50 cm RV Mid diam:  2.00 cm RV S prime:     13.20 cm/s TAPSE (M-mode): 2.2 cm LEFT ATRIUM             Index        RIGHT ATRIUM           Index LA Vol (A2C):   33.1 ml 22.86 ml/m  RA  Area:     15.80 cm LA Vol (A4C):   49.9 ml 34.47 ml/m  RA Volume:   38.50 ml  26.59 ml/m LA Biplane Vol: 43.9 ml 30.32 ml/m  AORTIC VALVE                     PULMONIC VALVE AV Area (Vmax):    1.32 cm      PV Vmax:       1.15 m/s AV Area (Vmean):   1.31 cm      PV Vmean:      74.800 cm/s AV Area (VTI):     1.42 cm      PV VTI:        0.239 m AV Vmax:           197.50 cm/s   PV Peak grad:  5.3 mmHg AV Vmean:          129.000 cm/s  PV Mean grad:  3.0 mmHg AV VTI:            0.410 m AV Peak Grad:      15.6 mmHg AV Mean Grad:      8.0 mmHg LVOT Vmax:         82.90 cm/s LVOT Vmean:        53.800 cm/s LVOT VTI:          0.185 m LVOT/AV VTI ratio: 0.45 AI PHT:            407 msec  AORTA Ao Root diam: 3.60 cm Ao Asc diam:  3.70 cm MITRAL VALVE               TRICUSPID VALVE MV Area (PHT): 2.73 cm    TR Peak grad:   33.6 mmHg MV Area VTI:   2.24 cm    TR Vmax:        290.00 cm/s MV Peak grad:  5.1 mmHg MV Mean grad:  2.0 mmHg    SHUNTS MV Vmax:       1.13 m/s    Systemic VTI:  0.18 m MV Vmean:      55.9 cm/s   Systemic Diam: 2.00 cm MV Decel Time: 278 msec MV E velocity: 35.40 cm/s MV A velocity: 58.20 cm/s MV E/A ratio:  0.61 Lyman Bishop MD Electronically signed by Lyman Bishop MD Signature Date/Time: 06/04/2021/11:14:53 AM    Final    DG Hip Unilat W or Wo Pelvis 2-3 Views Left  Result Date: 06/03/2021 CLINICAL DATA:  Fall, left hip pain EXAM: DG HIP (WITH OR WITHOUT PELVIS) 2-3V LEFT COMPARISON:  None Available. FINDINGS: Osseous structures are diffusely osteopenic. There is an acute, mildly comminuted intratrochanteric fracture of the left hip with override and external rotation of the distal fracture fragment. Femoral head appears seated within the left acetabulum. Left hip joint space is preserved. Limited evaluation of the right hip is unremarkable. Vascular calcifications are noted. IMPRESSION: Acute mildly comminuted left intratrochanteric hip fracture. Osteopenia. Electronically Signed   By: Fidela Salisbury M.D.   On: 06/03/2021 22:06   Labs  Recent Labs    06/03/21 2242  WBC 10.2  HGB 11.6*  HCT 36.6  PLT 145*   Recent Labs    06/03/21 2242  NA 135  K 3.8  CL 103  CO2 25  BUN 15  CREATININE 0.52  GLUCOSE 113*  CALCIUM 8.3*   Lab Results  Component Value Date   INR 1.2 06/04/2021   INR 1.26 04/04/2014   INR 1.02 08/10/2012        Physical Examination  Patient is a 86 y.o. year old female who is alert, well appearing, and in no distress, mood is calm.  Orientation: oriented to person, place, time, and general circumstances  Vital Signs: BP (!) 160/58 (BP Location: Right Arm)   Pulse 64   Temp 98.3 F (36.8 C) (Oral)   Resp (!) 21   Ht 5' (1.524 m)   Wt 49.9 kg   SpO2 98%   BMI 21.48 kg/m    Gait: Unable to ambulate due to injury.  Supine on stretcher.  Heart: Normal rate Lungs: Non-labored breathing Abdomen: Soft, Non-tender   Right Upper Extremity: Inspection: Atraumatic Palpation: Nontender ROM: Full, painless Joint Stability: No instability Strength: Normal Skin: Intact Peripheral Vascular: Well perfused Reflexes: No pathologic Sensation: Intact to light touch distally Lymph Nodes: None Palpable Coordination: Intact, normal   Left Upper Extremity: Inspection: Atraumatic Palpation: Nontender ROM: Full, painless Joint Stability: No instability Strength: Normal Skin: Intact Peripheral Vascular: Well perfused Reflexes: No pathologic Sensation: Intact to light touch distally Lymph Nodes: None Palpable Coordination: Intact, normal    Right Lower Extremity: Inspection: Atraumatic Palpation: Nontender ROM: Full, painless Joint Stability: No instability Strength: Normal Skin: Intact Peripheral Vascular: Well perfused Reflexes: No pathologic Sensation: Intact to light touch distally Lymph Nodes: None Palpable Coordination: Intact, normal   Left Lower Extremity: Inspection: Hip held in flexion and slight external  rotation Palpation: Tender to palpation over hip and region of known intertrochanteric fracture ROM: Hip range of motion severely limited due to injury; knee range of motion likewise limited by recent history of patellar ORIF Joint Stability: No knee or ankle instability Strength: Normal dorsiflexion, plantarflexion, and EHL strength and function Skin: Intact Peripheral Vascular: Normal DP pulse, warm and well-perfused distally Reflexes: No pathologic Sensation: Diminished to light touch in the superficial peroneal distribution, baseline, related to neuropathy: Intact in the deep peroneal and tibial distributions distally Lymph Nodes: None Palpable Coordination: Limited due to injury    Pelvis: Skin: Intact Palpation: No tenderness Stability: No instability      The review of the patient's medications does not in any way constitute an endorsement, by this clinician,  of their use, dosage, indications, route, efficacy, interactions, or other clinical parameters.  This note was generated within the [employer] EMR using Dragon medical speech recognition software and may contain inherent errors or omissions not intended by the user. Grammatical and punctuation errors, random word insertions, deletions, pronoun errors and incomplete sentences are occasional consequences of this technology due to software limitations. Not all errors are caught or corrected.  Although every attempt is made to root out erroneus and incomplete transcription, the note may still not fully represent the intent or opinion of the author. If there are questions or concerns about the content of this note or information contained within the body of this dictation they should be addressed directly with the author for clarification.

## 2021-06-04 NOTE — Assessment & Plan Note (Signed)
Given pt hitting her head CT head has been ordered and still pending PT OT eval prior to dc

## 2021-06-04 NOTE — TOC CAGE-AID Note (Signed)
Transition of Care [facility]) - CAGE-AID Screening   Patient Details  Name: Stephanie Collier MRN: [id number] Date of Birth: 1935/03/01  Transition of Care [facility]) CM/SW Contact:    Revecca Nachtigal C Tarpley-Carter, [place] Phone Number: 06/04/2021, 1:32 PM   Clinical Narrative: Pt is unable to participate in Cage Aid. Pt is currently absent from room.  CSW will assess pt at a better time.  Pius Byrom Tarpley-Carter, MSW, LCSW-A Pronouns:  She/Her/Hers [facility] HealthTransitions of Care Clinical Social Worker Direct Number:  [phone] [email or web address]  CAGE-AID Screening: Substance Abuse Screening unable to be completed due to: : Patient unable to participate

## 2021-06-04 NOTE — Progress Notes (Signed)
Initial Nutrition Assessment  DOCUMENTATION CODES:   Severe malnutrition in context of chronic illness  INTERVENTION:   Ensure Enlive po BID, each supplement provides 350 kcal and 20 grams of protein.  Magic cup TID with meals, each supplement provides 290 kcal and 9 grams of protein.  MVI with minerals daily.  NUTRITION DIAGNOSIS:   Severe Malnutrition related to chronic illness (decreased activity and appetite since patellar fx) as evidenced by severe muscle depletion, severe fat depletion.  GOAL:   Patient will meet greater than or equal to 90% of their needs  MONITOR:   PO intake, Supplement acceptance  REASON FOR ASSESSMENT:   Consult Hip fracture protocol  ASSESSMENT:   86 yo female admitted with L hip fracture. PMH includes osteoporosis, scoliosis, Raynaud's disease, AAA, HTN, arthritis, L patellar fx.  Patient states that she doesn't tolerate a lot of milk. She does eat cheese and ice cream, but not every day. When she has milk, it's 1% or skim. She typically eats 3 meals at home. Breakfast: Special K with skim or 1% milk or old fashioned oats with berries and an English muffin. Lunch: salad. Dinner: vegetables, such as baked white or sweet potato and fish, chicken, or [place]. She states that she does not get enough protein, so she tries to buy high protein versions of the foods she eats (cereal, oat bread). She does not like processed foods and doesn't go out to eat very often. She avoids sugary foods because she has a family history of diabetes. She will have ice cream on occasion. She has been small since she was a child. She gained 40 lbs with each of her pregnancies. Usual weight is ~125 lbs, but she started losing weight during the pandemic and lost even more after her patellar fracture. She has never tried Ensure, but willing to try one after surgery today. Will also try magic cups with meals.  Labs reviewed. Medications reviewed.  Plans for surgery this  afternoon to repair L hip fracture.    Weight history reviewed.  Current stated weight is 110 lbs, unsure if this is accurate. If stated weight is correct, she has had an 8% weight loss over the past year, which is not significant.   NUTRITION - FOCUSED PHYSICAL EXAM:  Flowsheet Row Most Recent Value  Orbital Region Severe depletion  Upper Arm Region Severe depletion  Thoracic and Lumbar Region Severe depletion  Buccal Region Severe depletion  Temple Region Severe depletion  Clavicle Bone Region Severe depletion  Clavicle and Acromion Bone Region Severe depletion  Scapular Bone Region Severe depletion  Dorsal Hand Severe depletion  Patellar Region Moderate depletion  Anterior Thigh Region Moderate depletion  Posterior Calf Region Moderate depletion  Edema (RD Assessment) Mild  Hair Reviewed  Eyes Reviewed  Mouth Reviewed  Skin Reviewed  Nails Reviewed       Diet Order:   Diet Order             Diet NPO time specified  Diet effective ____                   EDUCATION NEEDS:   Education needs have been addressed  Skin:  Skin Assessment: Reviewed RN Assessment  Last BM:  5/24  Height:   Ht Readings from Last 1 Encounters:  06/03/21 5' (1.524 m)    Weight:   Wt Readings from Last 1 Encounters:  06/03/21 49.9 kg     BMI:  Body mass index is 21.48 kg/m.  Estimated Nutritional Needs:   Kcal:  1500-1700  Protein:  70-80 gm  Fluid:  >/= 1.5 L    Lucas Mallow RD, LDN, CNSC Please refer to Amion for contact information.

## 2021-06-04 NOTE — Transfer of Care (Signed)
Immediate Anesthesia Transfer of Care Note  Patient: Nieves Chapa  Procedure(s) Performed: INTRAMEDULLARY (IM) NAIL INTERTROCHANTRIC (Left)  Patient Location: PACU  Anesthesia Type:MAC combined with regional for post-op pain  Level of Consciousness: awake  Airway & Oxygen Therapy: Patient Spontanous Breathing  Post-op Assessment: Report given to RN and Post -op Vital signs reviewed and stable  Post vital signs: Reviewed and stable  Last Vitals:  Vitals Value Taken Time  BP    Temp    Pulse    Resp    SpO2      Last Pain:  Vitals:   06/04/21 1310  TempSrc:   PainSc: 0-No pain      Patients Stated Pain Goal: 3 (98/26/41 5830)  Complications: No notable events documented.

## 2021-06-04 NOTE — Anesthesia Postprocedure Evaluation (Signed)
Anesthesia Post Note  Patient: Stephanie Collier  Procedure(s) Performed: INTRAMEDULLARY (IM) NAIL INTERTROCHANTRIC (Left)     Patient location during evaluation: PACU Anesthesia Type: General Level of consciousness: patient cooperative and sedated Pain management: pain level controlled Vital Signs Assessment: post-procedure vital signs reviewed and stable Respiratory status: spontaneous breathing, nonlabored ventilation, respiratory function stable and patient connected to nasal cannula oxygen Cardiovascular status: blood pressure returned to baseline and stable Postop Assessment: no apparent nausea or vomiting Anesthetic complications: no   No notable events documented.  Last Vitals:  Vitals:   06/04/21 1600 06/04/21 1626  BP: (!) 150/55 (!) 159/50  Pulse: (!) 58 (!) 57  Resp: (!) 8 20  Temp:  36.7 C  SpO2: 94% 100%    Last Pain:  Vitals:   06/04/21 1626  TempSrc: Oral  PainSc: 6                  Terease Marcotte,E. Lydiana Milley

## 2021-06-04 NOTE — Op Note (Signed)
OPERATIVE NOTE  Stephanie Collier female 86 y.o. 06/04/2021  PREOPERATIVE DIAGNOSIS: Left displaced intertrochanteric fracture Osteoporosis with current insufficiency fracture of left proximal femur  POSTOPERATIVE DIAGNOSIS: Left displaced intertrochanteric fracture (Z61.096) Osteoporosis with current insufficiency fracture of left proximal femur (M80.051)  PROCEDURE(S): Left displaced intertrochanteric fracture reduction and cephalomedullary nail fixation (04540) Operative use of fluoroscopy for above procedure(s) (98119)   SURGEON: Ernestina Columbia, M.D.  ASSISTANT(S): None  ANESTHESIA: Choice  FINDINGS: Preoperative Examination: Left Lower Extremity: Inspection: Hip held in flexion and slight external rotation Palpation: Tender to palpation over hip and region of known intertrochanteric fracture Strength: Normal dorsiflexion, plantarflexion, and EHL strength and function Peripheral Vascular: Normal DP pulse, warm and well-perfused distally Sensation: Diminished to light touch in the superficial peroneal distribution, baseline, related to neuropathy; intact in the deep peroneal and tibial distributions distally  Operative Findings: Displaced left intertrochanteric fracture.  Reduction with restoration of neck-shaft alignment achieved with traction and ligamentotaxis on Hana table.  Reduction confirmed on orthogonal AP and lateral fluoroscopic views.  Appropriate placement of cephalomedullary nail fixation with stable reduction of fracture confirmed on orthogonal AP and lateral fluoroscopic views.  IMPLANTS: Implant Name Type Inv. Item Serial No. Manufacturer Lot No. LRB No. Used Action  NAIL TROCH FIX [id number] LT 130 - [id number] Nail NAIL TROCH FIX [id number] LT 130  [employer] [id number] Left 1 Implanted  SCREW FENES TFNA 100 ST - [id number] Screw SCREW FENES TFNA 100 ST  [employer] [id number] Left 1 Implanted  SCREW LOCK IM TI 5X30 - [id number] Screw SCREW LOCK IM TI 5X30  [employer]PU[employer]  ORTHOPA[employer]  Left 1 Implanted    INDICATIONS:  The patient is a 86 y.o. female who sustained a mechanical fall in her home.  She is unable to bear weight on the left lower extremity after the injury.  Prior to the injury, she was ambulatory with a walker, but was fairly independent otherwise.  Given the morbidity associated with nonoperative treatment and in order to restore her to previous level of function, she was recommended to undergo surgical treatment with cephalomedullary nail fixation of the intertrochanteric fracture.  She understood the risks, benefits and alternatives to surgery which include but are not limited to bleeding, wound healing complications, infection, damage to surrounding structures, persistent pain, stiffness, lack of improvement, potential for subsequent arthritis or worsening of pre-existing arthritis, nonunion, malunion, and need for further surgery, as well as complications related to anesthesia, cardiovascular complications, and death.  She also understood the potential for continued pain, and that there were no guarantees of acceptable outcome.  After weighing these risks the patient opted to proceed with surgery.  TECHNIQUE: Patient was identified in the preoperative holding area.  The left hip was marked by myself.  Consent was signed by myself and the patient.   FI block was performed by anesthesia in the preoperative holding area.  Patient was taken to the operative suite and placed supine on the operative table.  Anesthesia was induced by the anesthesia team.  The patient was positioned appropriately for the procedure and all bony prominences were well padded.  A tourniquet was not used.  Preoperative antibiotics were given. The extremity was prepped and draped in the usual sterile fashion and surgical timeout was performed.  A closed reduction was performed on the Hana table with ligamentotaxis, and reduction was confirmed on orthogonal AP and lateral fluoroscopic  views.  Bony surface anatomy was marked out on the skin, and the short curvilinear incision proximal to the  tip of the trochanter was marked out laterally.  Skin was incised sharply.  Underlying subcutaneous fat was dissected with Bovie electrocautery down to the fascia layer.  Fascia was split sharply in line with fibers, and underlying muscular tissue was bluntly dissected in line with fibers and Cobb.  The trochanteric starting point was identified fluoroscopically, and the starting wire was positioned at the trochanteric starting point and advanced into the intramedullary canal of the proximal femur.  Appropriate intramedullary position was confirmed on orthogonal AP and lateral fluoroscopic views.  The proximal femur was then cannulated at the trochanteric entry point with the starting reamer, and the starting reamer and wire were withdrawn.  An 11 mm x 235 mm cephalomedullary nail implant was mounted through the jig and impacted into appropriate position under fluoroscopic guidance, confirming intramedullary position on orthogonal AP and lateral fluoroscopic views.  Lateral entry point for the head screw was noted on the skin, and skin and underlying subcutaneous tissues and fascia were sharply dissected down onto the lateral femoral cortex.  The guide sleeve for the head screw was advanced into position on the lateral femoral cortex, and a guidewire for the head screw was advanced proximally along the femoral calcar, avoiding subchondral penetration in the femoral head.  Measurement for the head screw was obtained, and cannulated drill was used to create a path for the screw over the guidewire.  A derotation wire was placed through the jig.  Screw was then advanced into position of the guidewire and statically locked down.  Derotation wire was removed.  Through the same lateral distal incision a distal interlocking screw was placed through the nail.  The jig assembly was removed and final fluoroscopic images  were obtained which demonstrated maintenance of fracture reduction with appropriate placement of cephalomedullary nail and interlock screw.  Wounds were copiously irrigated and hemostasis obtained.  1 g of vancomycin powder was placed through all layers throughout the incision.  Fascial layer was closed with interrupted #1 PDS figure-of-eight sutures, followed by simple inverted interrupted 0 Vicryl in the deep fat, followed by simple inverted interrupted 2-0 Monocryl deep dermal, followed by running 3 Monocryl subcuticular.  Skin was sealed with Dermabond and tails were secured with Steri-Strips.  Aquacel dressing was placed over the wound.  Patient was awakened from anesthesia and transferred to PACU in stable condition.  She tolerated the procedure well.  There were no complications.  POST OPERATIVE INSTRUCTIONS: Mobility: Out of bed with PT/OT Pain control: Continue to wean/titrate to appropriate oral regimen DVT Prophylaxis: Lovenox x6 weeks postoperatively Further surgical plans: None RUE: Weightbearing as tolerated, no restrictions LUE: Weightbearing as tolerated, no restrictions RLE: Weightbearing as tolerated, no restrictions LLE: Weightbearing as tolerated, no restrictions Disposition: Per primary team as medically appropriate Dressing care: Keep AQUACEL on and dry for up to 14 days.  Do not allow surgical area to get wet before that.  Remove AQUACEL dressing after 14 days and allow area to get wet in shower but DO NOT SUBMERGE until wound is evaluated in clinic.  In most cases skin glue is used and no additional dressing is necessary.  Follow-up: Please call [facility] [phone]) to schedule follow-op appointment for 2 weeks after surgery.  TOURNIQUET TIME: * No tourniquets in log *  BLOOD LOSS: 200 mL         DRAINS: none         SPECIMEN: none       COMPLICATIONS:  * No complications  entered in OR log *         DISPOSITION: PACU -  hemodynamically stable.         CONDITION: stable   Ernestina Columbia M.D. Orthopaedic Surgery [facility]   Portions of the record have been created with voice recognition software.  Grammatical and punctuation errors, random word insertions, wrong-word or "sound-a-like" substitutions, pronoun errors (inaccuracies and/or substitutions), and/or incomplete sentences may have occurred due to the inherent limitations of voice recognition software.  Not all errors are caught or corrected.  Although every attempt is made to root out erroneous and incomplete transcription, the note may still not fully represent the intent or opinion of the author.  Read the chart carefully and recognize, using context, where errors/substitutions have occurred.  Any questions or concerns about the content of this note or information contained within the body of this dictation should be addressed directly with the author for clarification.

## 2021-06-04 NOTE — Assessment & Plan Note (Signed)
-   management as per orthopedics,  plan to operate  in  a.m.     Keep nothing by mouth post midnight. Patient   not on anticoagulation or antiplatelet agents   Ordered type and screen,  order a vitamin D level  Patient at baseline   able to walk   100 feet  But does get joint pain that limits her   Patient denies any chest pain or shortness of breath currently and/or with exertion,    ECG showing LVH and prolonged QTC  no known history of coronary artery disease,  But has mild COPD  On exam noted cardiac murmur Given advanced age patient is at least moderate  Risk    will order echo repeat ECG in AM If ECHO is severely abnormal may need Cardiology consult for further pre-op clearance

## 2021-06-04 NOTE — Assessment & Plan Note (Signed)
Order echogram 

## 2021-06-04 NOTE — Discharge Instructions (Signed)
Discharge instructions for Dr. Ernestina Columbia, M.D., Orthopaedic Surgeon, [facility]:  Diet: As you were doing prior to hospitalization, unless instructed otherwise by medical team, dietary/nutrition team, etc. Dressing:  Keep AQUACEL on and dry for up to 14 days.  Do not allow surgical area to get wet before that.  Remove AQUACEL dressing after 14 days and allow area to get wet in shower but DO NOT SUBMERGE until wound is evaluated in clinic.  In most cases skin glue is used and no additional dressing is necessary.  Shower:  Unless otherwise specified, may shower but keep the wounds dry, use an occlusive plastic wrap, NO SOAKING IN TUB.  If the bandage gets wet, change with a clean dry gauze.  Unless otherwise specified, after 5 days dressing(s) should be removed (7 days for PREVENA dressings) and wound(s) may get wet in the shower by allowing water to gently run over.  Again, no soaking in tub, and do NOT submerge for at least 2 weeks!!! Activity:  Increase activity slowly as tolerated.  If you right leg is injured or immobilized, no driving for 6 weeks or until discussed with your surgeon.  If you have an injury or immobilization of the left lower extremity you may not operate a clutch. Please note that driving with any kind of immobilization for the upper extremity (sling, shoulder brace, splint, cast, etc.) may also be considered impaired driving and should not be attempted. Weight Bearing: WEIGHTBEARING AS TOLERATED (WBAT) on the LEFT LOWER EXTREMITY. To prevent constipation: You may use over-the-counter stool softener(s) such as Colace (over the counter) 100 mg by mouth twice a day and/or Miralax (over the counter) for constipation as needed.  Drink plenty of fluids (prune juice may be helpful) and high fiber foods.  Itching:  If you experience itching with your medications, try taking only a single pain pill, or even half a pain pill at a time.  You can also use  benadryl over the counter for itching or also to help with sleep.  Precautions:  If you experience chest pain or shortness of breath - call [phone] immediately for transfer to the hospital emergency department!! Medications: Please contact the clinic during office hours (Monday through Friday, 0800 to 1600) if you need a refill on any medications.  Please monitor medications and allow 24 to 48 hours to process refill request!!!!  Please note that only medications directly related to the surgery can be prescribed.  For other medications (e.g. blood pressure medicines, sleeping medicines, etc.), please contact the prescribing physician or your primary care provider. DVT Prophylaxis: Lovenox x6 weeks postoperatively.  If you develop a fever greater that 101.1 deg F, purulent drainage from wound, increased redness or drainage from wound, or calf pain -- Call the office at [phone].

## 2021-06-05 ENCOUNTER — Inpatient Hospital Stay (HOSPITAL_COMMUNITY): Payer: Medicare Other

## 2021-06-05 ENCOUNTER — Encounter (HOSPITAL_COMMUNITY): Payer: Self-pay | Admitting: Orthopedic Surgery

## 2021-06-05 DIAGNOSIS — S72002A Fracture of unspecified part of neck of left femur, initial encounter for closed fracture: Secondary | ICD-10-CM | POA: Diagnosis not present

## 2021-06-05 LAB — CBC
HCT: 28.3 % — ABNORMAL LOW (ref 36.0–46.0)
Hemoglobin: 9 g/dL — ABNORMAL LOW (ref 12.0–15.0)
MCH: 30.4 pg (ref 26.0–34.0)
MCHC: 31.8 g/dL (ref 30.0–36.0)
MCV: 95.6 fL (ref 80.0–100.0)
Platelets: 103 10*3/uL — ABNORMAL LOW (ref 150–400)
RBC: 2.96 MIL/uL — ABNORMAL LOW (ref 3.87–5.11)
RDW: 14.6 % (ref 11.5–15.5)
WBC: 5.1 10*3/uL (ref 4.0–10.5)
nRBC: 0 % (ref 0.0–0.2)

## 2021-06-05 LAB — BASIC METABOLIC PANEL
Anion gap: 4 — ABNORMAL LOW (ref 5–15)
BUN: 9 mg/dL (ref 8–23)
CO2: 22 mmol/L (ref 22–32)
Calcium: 7.7 mg/dL — ABNORMAL LOW (ref 8.9–10.3)
Chloride: 105 mmol/L (ref 98–111)
Creatinine, Ser: 0.52 mg/dL (ref 0.44–1.00)
GFR, Estimated: >60 mL/min (ref >60)
Glucose, Bld: 146 mg/dL — ABNORMAL HIGH (ref 70–99)
Potassium: 3.7 mmol/L (ref 3.5–5.1)
Sodium: 131 mmol/L — ABNORMAL LOW (ref 135–145)

## 2021-06-05 LAB — MAGNESIUM: Magnesium: 1.6 mg/dL — ABNORMAL LOW (ref 1.7–2.4)

## 2021-06-05 MED ORDER — SODIUM CHLORIDE 0.9 % IV SOLN
250.0000 mg | Freq: Every day | INTRAVENOUS | Status: AC
  Administered 2021-06-05 – 2021-06-06 (×2): 250 mg via INTRAVENOUS
  Filled 2021-06-05 (×2): qty 20

## 2021-06-05 MED ORDER — ENOXAPARIN SODIUM 40 MG/0.4ML IJ SOSY: 40.0000 mg | PREFILLED_SYRINGE | INTRAMUSCULAR | 0 refills | Status: DC

## 2021-06-05 MED ORDER — HYDROCODONE-ACETAMINOPHEN 5-325 MG PO TABS
1.0000 | ORAL_TABLET | Freq: Four times a day (QID) | ORAL | 0 refills | Status: DC | PRN
Start: 2021-06-05 — End: 2022-02-08

## 2021-06-05 MED ORDER — MAGNESIUM SULFATE 2 GM/50ML IV SOLN
2.0000 g | Freq: Once | INTRAVENOUS | Status: AC
  Administered 2021-06-05: 2 g via INTRAVENOUS
  Filled 2021-06-05: qty 50

## 2021-06-05 MED ORDER — HYDROCODONE-ACETAMINOPHEN 5-325 MG PO TABS
1.0000 | ORAL_TABLET | Freq: Four times a day (QID) | ORAL | 0 refills | Status: DC | PRN
Start: 2021-06-05 — End: 2021-06-05

## 2021-06-05 MED ORDER — SODIUM CHLORIDE 0.9 % IV SOLN
INTRAVENOUS | Status: AC
Start: 2021-06-05 — End: 2021-06-05

## 2021-06-05 NOTE — Progress Notes (Signed)
Pt due to void at 1900. Pt has purewick and has not voided. Bladder scanned, 181 noted. mIVF infusing. Pt stated she does not feel the urge to "pee" and she will let us know when she does.

## 2021-06-05 NOTE — Progress Notes (Signed)
This RN tried to remove patient foley but she refused and said she is not able to get out bed to [facility], she is requesting to have a purewick before we can remove foley but charge nurse said we're not allow to use it on hip fx. So patient said she will keep the foley until she talk to someone. Patient educated in infection risk. We continue to monitor.

## 2021-06-05 NOTE — Evaluation (Signed)
Clinical/Bedside Swallow Evaluation Patient Details  Name: Stephanie Collier MRN: [id number] Date of Birth: [birth year]  Today's Date: 06/05/2021 Time: SLP Start Time (ACUTE ONLY): 1119 SLP Stop Time (ACUTE ONLY): 1130 SLP Time Calculation (min) (ACUTE ONLY): 11 min  Past Medical History:  Past Medical History:  Diagnosis Date   AAA (abdominal aortic aneurysm) (HCC)    Arthritis    hip   Hypertension    no meds now   Osteoporosis    Patellar fracture    left   Raynaud's disease    Scoliosis    Past Surgical History:  Past Surgical History:  Procedure Laterality Date   APPENDECTOMY     at age 73   BREAST BIOPSY Left    CHEST TUBE INSERTION Left 04/04/2014   Procedure: CHEST TUBE INSERTION;  Surgeon: Nada Libman, MD;  Location: [facility] OR;  Service: Vascular;  Laterality: Left;  by Dr. Collier Bullock   INSERTION OF VENA CAVA FILTER N/A 04/02/2014   Procedure: INSERTION OF VENA CAVA FILTER;  Surgeon: Nada Libman, MD;  Location: [facility] CATH LAB;  Service: Cardiovascular;  Laterality: N/A;   INTRAMEDULLARY (IM) NAIL INTERTROCHANTERIC Left 06/04/2021   Procedure: INTRAMEDULLARY (IM) NAIL INTERTROCHANTRIC;  Surgeon: Ernestina Columbia, MD;  Location: [facility] OR;  Service: Orthopedics;  Laterality: Left;   ORIF PATELLA Left 11/20/2020   Procedure: OPEN REDUCTION INTERNAL (ORIF) FIXATION PATELLA;  Surgeon: Joen Laura, MD;  Location: [facility];  Service: Orthopedics;  Laterality: Left;   PERIPHERAL VASCULAR CATHETERIZATION N/A 06/26/2014   Procedure: IVC Filter Removal;  Surgeon: Nada Libman, MD;  Location: [facility] INVASIVE CV LAB;  Service: Cardiovascular;  Laterality: N/A;   THORACIC AORTIC ENDOVASCULAR STENT GRAFT N/A 04/04/2014   Procedure: THORACIC AORTIC ENDOVASCULAR STENT GRAFT;  Surgeon: Nada Libman, MD;  Location: [facility] OR;  Service: Vascular;  Laterality: N/A;   HPI:  Stephanie Collier is a 86 y.o. female with medical history significant of  Thoracic aortic aneurysm, COPD, and iliac  artery aneurysm. Presented with a fall and is now noted to have a left nondisplaced intertrochanteric fracture.  She is noted to have a cardiac murmur with 2D echocardiogram pending. Patient is s/p ORIF L hip 06/04/21. Recent patella ORIF on left as well.    Assessment / Plan / Recommendation  Clinical Impression  Pt demonstrates so signs of aspiration or dysphagia. Recommend pt continue a regular diet and thin liquids.      Aspiration Risk       Diet Recommendation Regular;Thin liquid   Liquid Administration via: Cup;Straw Medication Administration: Whole meds with liquid Supervision: Patient able to self feed    Other  Recommendations      Recommendations for follow up therapy are one component of a multi-disciplinary discharge planning process, led by the attending physician.  Recommendations may be updated based on patient status, additional functional criteria and insurance authorization.  Follow up Recommendations No SLP follow up      Assistance Recommended at Discharge    Functional Status Assessment    Frequency and Duration            Prognosis        Swallow Study   General HPI: Stephanie Collier is a 86 y.o. female with medical history significant of  Thoracic aortic aneurysm, COPD, and iliac artery aneurysm. Presented with a fall and is now noted to have a left nondisplaced intertrochanteric fracture.  She is noted to have a cardiac murmur with 2D echocardiogram pending. Patient  is s/p ORIF L hip 06/04/21. Recent patella ORIF on left as well. Type of Study: Bedside Swallow Evaluation Diet Prior to this Study: Regular;Thin liquids Temperature Spikes Noted: No Respiratory Status: Room air History of Recent Intubation: No Behavior/Cognition: Alert;Cooperative;Pleasant mood Oral Cavity Assessment: Within Functional Limits Oral Care Completed by SLP: No Oral Cavity - Dentition: Adequate natural dentition Vision: Functional for self-feeding Self-Feeding Abilities: Able to  feed self Patient Positioning: Upright in chair Baseline Vocal Quality: Normal Volitional Cough: Strong Volitional Swallow: Able to elicit    Oral/Motor/Sensory Function Overall Oral Motor/Sensory Function: Within functional limits   Ice Chips     Thin Liquid Thin Liquid: Within functional limits    Nectar Thick     Honey Thick     Puree Puree: Within functional limits   Solid     Solid: Within functional limits      Stephanie Collier, Riley Nearing 06/05/2021,2:38 PM

## 2021-06-05 NOTE — Progress Notes (Signed)
Pt refused ensure and colace this shift. Pt educated on both and verbalized understanding. Pt stated, " I am probably going to leave against medical advice tomorrow. I can do more at home than I am doing here. How do I start the process?" Pt educated on AMA and educated on forfeiting treatment. Pt stated, " I will get whatever I need from my PCP."

## 2021-06-05 NOTE — TOC CAGE-AID Note (Signed)
Transition of Care [facility]) - CAGE-AID Screening   Patient Details  Name: Stephanie Collier MRN: [id number] Date of Birth: 27-Apr-1935  Transition of Care [facility]) CM/SW Contact:    Shayli Altemose C Tarpley-Carter, [place] Phone Number: 06/05/2021, 3:23 PM   Clinical Narrative: Pt participated in [place].  Pt stated she does not use substance or ETOH.  Pt was not offered resources, due to no usage of substance or ETOH.    Sagan Wurzel Tarpley-Carter, MSW, LCSW-A Pronouns:  She/Her/Hers [facility] Clinical Social Worker Direct Number:  [phone] [email or web address]   CAGE-AID Screening: Substance Abuse Screening unable to be completed due to: : Patient unable to participate  Have You Ever Felt You Ought to Cut Down on Your Drinking or Drug Use?: No Have People Annoyed You By [employer] Your Drinking Or Drug Use?: No Have You Felt Bad Or Guilty About Your Drinking Or Drug Use?: No Have You Ever Had a Drink or Used Drugs First Thing In The Morning to Steady Your Nerves or to Get Rid of a Hangover?: No CAGE-AID Score: 0  Substance Abuse Education Offered: No

## 2021-06-05 NOTE — Care Management Important Message (Signed)
Important Message  Patient Details  Name: Stephanie Collier MRN: [id number] Date of Birth: April 19, 1935   Medicare Important Message Given:  Yes     Drequan Ironside Stefan Church 06/05/2021, 3:39 PM

## 2021-06-05 NOTE — Evaluation (Signed)
Physical Therapy Evaluation Patient Details Name: Stephanie Collier MRN: [id number] DOB: 05-14-1935 Today's Date: 06/05/2021  History of Present Illness  Stephanie Collier is a 86 y.o. female with medical history significant of  Thoracic aortic aneurysm, COPD, and iliac artery aneurysm. Presented with a fall and is now noted to have a left nondisplaced intertrochanteric fracture.  She is noted to have a cardiac murmur with 2D echocardiogram pending. Patient is s/p ORIF L hip 06/04/21. Recent patella ORIF on left as well.   Clinical Impression  Patient received in bed, requires encouragement to participate with therapy. Wants to rest. She requires mod A for all mobility at this time. Unable to take functional steps due to pain/fatigue. She will continue to benefit from skilled PT while here to improve functional independence and safety with mobility. She may benefit from SNF upon discharge if mobility does not improve significantly.         Recommendations for follow up therapy are one component of a multi-disciplinary discharge planning process, led by the attending physician.  Recommendations may be updated based on patient status, additional functional criteria and insurance authorization.  Follow Up Recommendations Skilled nursing-short term rehab (<3 hours/day)    Assistance Recommended at Discharge Frequent or constant Supervision/Assistance  Patient can return home with the following  A lot of help with walking and/or transfers;A lot of help with bathing/dressing/bathroom;Help with stairs or ramp for entrance;Assist for transportation;Assistance with cooking/housework    Equipment Recommendations None recommended by PT  Recommendations for Other Services       Functional Status Assessment Patient has had a recent decline in their functional status and demonstrates the ability to make significant improvements in function in a reasonable and predictable amount of time.     Precautions / Restrictions  Precautions Precautions: Fall Restrictions Weight Bearing Restrictions: Yes LLE Weight Bearing: Weight bearing as tolerated      Mobility  Bed Mobility Overal bed mobility: Needs Assistance Bed Mobility: Supine to Sit     Supine to sit: Mod assist, HOB elevated     General bed mobility comments: cues and assist needed to come to edge of bed due to pain.    Transfers Overall transfer level: Needs assistance Equipment used: Rolling walker (2 wheels) Transfers: Sit to/from Stand, Bed to chair/wheelchair/BSC Sit to Stand: Mod assist, From elevated surface Stand pivot transfers: Mod assist         General transfer comment: cues and mod assist to come to standing, upright posture.    Ambulation/Gait               General Gait Details: unable to take functional steps. Pivoted from bed to recliner. Cues needed to keep both hands on walker, stand upright  Stairs            Wheelchair Mobility    Modified Rankin (Stroke Patients Only)       Balance Overall balance assessment: Needs assistance Sitting-balance support: Feet supported Sitting balance-Leahy Scale: Fair     Standing balance support: Bilateral upper extremity supported, During functional activity, Reliant on assistive device for balance Standing balance-Leahy Scale: Poor Standing balance comment: reliant on B UE support, min/mod assist for safety                             Pertinent Vitals/Pain Pain Assessment Pain Assessment: Faces Faces Pain Scale: Hurts whole lot Pain Location: L le with mobility, declined additional pain medicine prior to  session. Pain Descriptors / Indicators: Discomfort, Sore, Grimacing, Guarding Pain Intervention(s): Monitored during session, Repositioned    Home Living Family/patient expects to be discharged to:: Private residence Living Arrangements: Children Available Help at Discharge: Family;Available 24 hours/day Type of Home: House Home  Access: Stairs to enter Entrance Stairs-Rails: Left Entrance Stairs-Number of Steps: 3   Home Layout: One level Home Equipment: Agricultural consultant (2 wheels);Rollator (4 wheels)      Prior Function Prior Level of Function : Independent/Modified Independent;History of Falls (last six months)             Mobility Comments: uses rollator for mobility or RW ADLs Comments: mod independent     Hand Dominance        Extremity/Trunk Assessment   Upper Extremity Assessment Upper Extremity Assessment: Defer to OT evaluation    Lower Extremity Assessment Lower Extremity Assessment: LLE deficits/detail;Generalized weakness LLE: Unable to fully assess due to pain LLE Coordination: decreased gross motor    Cervical / Trunk Assessment Cervical / Trunk Assessment: Kyphotic  Communication   Communication: No difficulties  Cognition Arousal/Alertness: Awake/alert Behavior During Therapy: WFL for tasks assessed/performed, Agitated Overall Cognitive Status: Within Functional Limits for tasks assessed                                 General Comments: slightly agitated/uncooperative at times. Requires encouragement.        General Comments      Exercises     Assessment/Plan    PT Assessment Patient needs continued PT services  PT Problem List Decreased strength;Decreased mobility;Decreased safety awareness;Decreased range of motion;Decreased coordination;Decreased activity tolerance;Decreased balance;Pain;Decreased knowledge of use of DME;Decreased cognition;Decreased skin integrity       PT Treatment Interventions DME instruction;Therapeutic exercise;Gait training;Balance training;Stair training;Functional mobility training;Therapeutic activities;Patient/family education    PT Goals (Current goals can be found in the Care Plan section)  Acute Rehab PT Goals Patient Stated Goal: to get rest PT Goal Formulation: With patient Time For Goal Achievement:  06/10/21 Potential to Achieve Goals: Fair    Frequency Min 3X/week     Co-evaluation               AM-PAC PT "6 Clicks" Mobility  Outcome Measure Help needed turning from your back to your side while in a flat bed without using bedrails?: A Lot Help needed moving from lying on your back to sitting on the side of a flat bed without using bedrails?: A Lot Help needed moving to and from a bed to a chair (including a wheelchair)?: A Lot Help needed standing up from a chair using your arms (e.g., wheelchair or bedside chair)?: A Lot Help needed to walk in hospital room?: Total Help needed climbing 3-5 steps with a railing? : Total 6 Click Score: 10    End of Session Equipment Utilized During Treatment: Gait belt Activity Tolerance: Patient limited by pain Patient left: in chair;with call bell/phone within reach;with chair alarm set Nurse Communication: Mobility status PT Visit Diagnosis: Other abnormalities of gait and mobility (R26.89);Difficulty in walking, not elsewhere classified (R26.2);Repeated falls (R29.6);Muscle weakness (generalized) (M62.81);Pain;Unsteadiness on feet (R26.81) Pain - Right/Left: Left Pain - part of body: Leg;Knee    Time: 6948-5462 PT Time Calculation (min) (ACUTE ONLY): 28 min   Charges:   PT Evaluation $PT Eval Moderate Complexity: 1 Mod PT Treatments $Therapeutic Activity: 8-22 mins        Beverly Suriano, PT, GCS 06/05/21,11:20  AM

## 2021-06-05 NOTE — TOC Initial Note (Signed)
Transition of Care [facility]) - Initial/Assessment Note    Patient Details  Name: Stephanie Collier MRN: [id number] Date of Birth: 01-11-1936  Transition of Care [facility]) CM/SW Contact:    [name], LCSW Phone Number: 06/05/2021, 2:16 PM  Clinical Narrative:  CSW spoke with pt regarding DC recommendation for SNF.  Pt lives with daughter [name], permission given to speak with [name].  Pt reports she would rather discharge back home "and do this on my own."  Pt reports prior surgery where she declined PT and did exercises herself, discussed that currently pt is requiring significant help and would need this from her daughter if she were to go straight home.  Pt reports daughter works from home and she would like to discuss with her, but she believes they could make it work.  Pt is vaccinated for covid with 2 boosters.    CSW spoke with daughter [name] by phone, she confirms that she is available to help, works from home.  She would like to meet with PT for some specifics on what pt would need and how to do it, but she is in agreement that she would prefer for pt to come home with [facility] and she thinks they could make this work.                Expected Discharge Plan: Home w Home Health Services Barriers to Discharge: Continued Medical Work up   Patient Goals and CMS Choice Patient states their goals for this hospitalization and ongoing recovery are:: "as good as I was"      Expected Discharge Plan and Services Expected Discharge Plan: Home w Home Health Services In-house Referral: Clinical Social Work   Post Acute Care Choice: Home Health Living arrangements for the past 2 months: Single Family Home                                      Prior Living Arrangements/Services Living arrangements for the past 2 months: Single Family Home Lives with:: Adult Children (daughter [name]) Patient language and need for interpreter reviewed:: Yes Do you feel safe going back to the place  where you live?: Yes      Need for Family Participation in Patient Care: Yes (Comment) Care giver support system in place?: Yes (comment) Current home services: Other (comment) (none) Criminal Activity/Legal Involvement Pertinent to Current Situation/Hospitalization: No - Comment as needed  Activities of Daily Living Home Assistive Devices/Equipment: Eyeglasses, Grab bars in shower, Hand-held shower hose, Hearing aid, Shower chair with back, Walker (specify type) ADL Screening (condition at time of admission) Patient's cognitive ability adequate to safely complete daily activities?: Yes Is the patient deaf or have difficulty hearing?: Yes Does the patient have difficulty seeing, even when wearing glasses/contacts?: Yes Does the patient have difficulty concentrating, remembering, or making decisions?: Yes Patient able to express need for assistance with ADLs?: Yes Does the patient have difficulty dressing or bathing?: Yes Independently performs ADLs?: No Communication: Independent Dressing (OT): Needs assistance Is this a change from baseline?: Change from baseline, expected to last >3 days Grooming: Needs assistance Is this a change from baseline?: Change from baseline, expected to last >3 days Feeding: Needs assistance Is this a change from baseline?: Change from baseline, expected to last >3 days Bathing: Needs assistance Is this a change from baseline?: Change from baseline, expected to last >3 days Toileting: Needs assistance Is this a change  from baseline?: Change from baseline, expected to last >3days In/Out Bed: Needs assistance Is this a change from baseline?: Change from baseline, expected to last >3 days Walks in Home: Needs assistance Is this a change from baseline?: Change from baseline, expected to last >3 days Does the patient have difficulty walking or climbing stairs?: Yes Weakness of Legs: Both Weakness of Arms/Hands: Both  Permission Sought/Granted Permission  sought to share information with : Family Supports Permission granted to share information with : Yes, Verbal Permission Granted  Share Information with NAME: daughter [name]           Emotional Assessment Appearance:: Appears stated age Attitude/Demeanor/Rapport: Engaged Affect (typically observed): Appropriate, Pleasant Orientation: : Oriented to Self, Oriented to Place, Oriented to  Time, Oriented to Situation Alcohol / Substance Use: Not Applicable Psych Involvement: No (comment)  Admission diagnosis:  Closed left hip fracture (HCC) [S72.002A] Closed nondisplaced intertrochanteric fracture of left femur, initial encounter (HCC) [S72.145A] Patient Active Problem List   Diagnosis Date Noted   Cardiac murmur 06/04/2021   Fall at home, initial encounter 06/04/2021   Normocytic anemia 06/04/2021   Closed left hip fracture (HCC) 06/03/2021   Prolonged QT interval 06/03/2021   Family history of breast cancer 11/13/2018   Malnutrition of moderate degree (HCC) 04/09/2014   Aortic aneurysm, thoracic (HCC) 04/04/2014   Acute bronchitis 03/31/2014   HTN (hypertension) 03/31/2014   Descending thoracic aortic dissection (HCC)    UTI (lower urinary tract infection)    PCP:  Marden Noble, MD Pharmacy:   [facility] [phone] - [place], [place] - [address] AT [facility] [address] [place] [address] Phone: [phone] Fax: [phone]     Social Determinants of Health (SDOH) Interventions    Readmission Risk Interventions     View : No data to display.

## 2021-06-05 NOTE — Progress Notes (Signed)
Orthopaedics Daily Progress Note   06/05/2021   8:03 AM  Stephanie Collier is a 86 y.o. female 1 Day Post-Op s/p INTRAMEDULLARY (IM) NAIL INTERTROCHANTRIC  Subjective Pain appropriately controlled.  Denies nausea, vomiting, or fevers.  Still complaining of knee pain and difficulty moving knee.  Recall that she does have a recent history of patella fracture s/p ORIF with Dr. Blanchie Dessert.    Objective Vitals:   06/04/21 2012 06/05/21 0549  BP:  (!) 140/55  Pulse:  72  Resp: 15 16  Temp:  98.1 F (36.7 C)  SpO2: 100% 96%    Intake/Output Summary (Last 24 hours) at 06/05/2021 0037 Last data filed at 06/04/2021 1856 Gross per 24 hour  Intake 1500 ml  Output 600 ml  Net 900 ml   Physical Exam LLE: Dressing clean, dry, and intact +DF/PF/EHL Diminished to light touch in SP baseline; intact DP/T; no changes from preop +DP/PT and WWP distally Unable to maintain SLR Global TTP about knee No palpable defects in quad or patellar tendons, and no focal TTP in these areas  Assessment 86 y.o. female s/p Procedure(s) (LRB): INTRAMEDULLARY (IM) NAIL INTERTROCHANTRIC (Left)  Plan Obtain dedicated XR L knee Mobility: Out of bed with PT/OT Pain control: Continue to wean/titrate to appropriate oral regimen Foley catheter: Not indicated from ortho perspective DVT Prophylaxis: Lovenox x6 weeks postoperatively Further surgical plans: None RUE: Weightbearing as tolerated, no restrictions LUE: Weightbearing as tolerated, no restrictions RLE: Weightbearing as tolerated, no restrictions LLE: Weightbearing as tolerated, no restrictions Disposition: Per primary team as medically appropriate Dressing care: Keep AQUACEL on and dry for up to 14 days.  Do not allow surgical area to get wet before that.  Remove AQUACEL dressing after 14 days and allow area to get wet in shower but DO NOT SUBMERGE until wound is evaluated in clinic.  In most cases skin glue is used and no additional dressing is necessary.   Follow-up: Please call [facility] [phone]) to schedule follow-op appointment for 2 weeks after surgery.  I have verified that my discharge instructions and follow-up information have been entered in the Discharge Navigator in Epic.  These should automatically populate in the AVS.  Please print the AVS in its entirety and ensure that the patient or a responsible party has a complete copy of the AVS before they are discharged.  If there are questions regarding discharge instructions or follow-up before the AVS is generated, please check the Discharge Navigator before attempting to contact the surgeon/office.  If unsure how to access the Discharge Navigator or the information contained in the Discharge Navigator, or how to generate/print the AVS, please contact the appropriate Proofreader.   The patient's postoperative prescriptions (e.g. pain medication, anticoagulation) have been printed, signed, and placed on the patient's hard chart.    Ernestina Columbia M.D. Orthopaedic Surgery [facility]

## 2021-06-05 NOTE — Progress Notes (Signed)
PROGRESS NOTE    Stephanie Collier  [id number] DOB: 03-05-1935 DOA: 06/03/2021 PCP: Josetta Huddle, MD   Brief Narrative:    Stephanie Collier is a 86 y.o. female with medical history significant of  Thoracic aortic aneurysm, COPD, and iliac artery aneurysm. Presented with a fall and is now noted to have a left nondisplaced intertrochanteric fracture.  She is now status post left IM nail on 5/25 and PT evaluation is pending.  She has a cardiac murmur and 2D echocardiogram demonstrates moderate aortic valve regurgitation.  This can be further followed up in the outpatient setting.  Assessment & Plan:   Principal Problem:   Closed left hip fracture (HCC) Active Problems:   Prolonged QT interval   HTN (hypertension)   Aortic aneurysm, thoracic (HCC)   Cardiac murmur   Fall at home, initial encounter   Normocytic anemia  Assessment and Plan:  Fall at home with Closed left hip fracture [facility])  -Status post IM nail 5/25  -PT evaluation pending today with likely need for placement -Lovenox for 6 weeks as recommended per orthopedics -Keep Aquacel dressing and remove after 14 days -Follow-up with orthopedics in 2 weeks -Dedicated x-ray to left knee due to pain in this area -Continue pain management -CT head with no acute findings on admission  Cardiac murmur 2D echocardiogram done 5/25 with LVEF 60-65% with grade 1 diastolic dysfunction and moderate AV regurgitation -Can be followed outpatient  Prolonged QT interval - will monitor on tele avoid QT prolonging medications, rehydrate correct magnesium and follow  Hypomagnesemia -Replete and recheck in am  Hyponatremia Maintain on some IV normal saline today and monitor  Anemia Likely worsening due to some postoperative blood loss with iron deficiency noted Plan to give IV iron today Continue to monitor in a.m. and transfuse as needed    HTN (hypertension) Pt is no longer takes Cozaar Will readdress BP after pain is  controlled Currently stable   Aortic aneurysm, thoracic ([place]) No active issues     DVT prophylaxis: Lovenox Code Status: Full Family Communication: None at bedside Disposition Plan:  Status is: Inpatient Remains inpatient appropriate because: Needs IV medications and surgery     Consultants:  Orthopedics   Procedures:  IM nail to left hip 5/25   Antimicrobials:  Anti-infectives (From admission, onward)    Start     Dose/Rate Route Frequency Ordered Stop   06/05/21 0600  ceFAZolin (ANCEF) IVPB 2g/100 mL premix        2 g 200 mL/hr over 30 Minutes Intravenous On call to O.R. 06/04/21 1233 06/05/21 0707   06/04/21 2000  ceFAZolin (ANCEF) IVPB 2g/100 mL premix        2 g 200 mL/hr over 30 Minutes Intravenous Every 6 hours 06/04/21 1848 06/05/21 0128   06/04/21 1409  vancomycin (VANCOCIN) powder  Status:  Discontinued          As needed 06/04/21 1409 06/04/21 1528   06/04/21 1238  ceFAZolin (ANCEF) 2-4 GM/100ML-% IVPB       Note to Pharmacy: Roosvelt Maser N: cabinet override      06/04/21 1238 06/04/21 1355       Subjective: Patient seen and evaluated today and was refusing to take some of her medications this morning.  She is hesitant to get up and do PT today.  No acute overnight events noted.  Objective: Vitals:   06/04/21 2011 06/04/21 2012 06/05/21 0549 06/05/21 0813  BP: (!) 143/57  (!) 140/55 (!) 147/59  Pulse: 64  72 74  Resp: (!) 23 15 16 19   Temp: 97.6 F (36.4 C)  98.1 F (36.7 C) 98.8 F (37.1 C)  TempSrc: Oral  Oral Oral  SpO2: 95% 100% 96% 96%  Weight:      Height:        Intake/Output Summary (Last 24 hours) at 06/05/2021 0913 Last data filed at 06/04/2021 1856 Gross per 24 hour  Intake 1500 ml  Output 600 ml  Net 900 ml   Filed Weights   06/03/21 2112 06/04/21 1320  Weight: 49.9 kg 49.9 kg    Examination:  General exam: Appears calm and comfortable  Respiratory system: Clear to auscultation. Respiratory effort  normal. Cardiovascular system: S1 & S2 heard, RRR.  Gastrointestinal system: Abdomen is soft Central nervous system: Alert and awake Extremities: No edema Skin: No significant lesions noted Psychiatry: Flat affect. Foley with clear, yellow urine output    Data Reviewed: I have personally reviewed following labs and imaging studies  CBC: Recent Labs  Lab 06/03/21 2242 06/05/21 0116  WBC 10.2 5.1  NEUTROABS 8.7*  --   HGB 11.6* 9.0*  HCT 36.6 28.3*  MCV 95.1 95.6  PLT 145* [id number]*   Basic Metabolic Panel: Recent Labs  Lab 06/03/21 2242 06/05/21 0116  NA 135 131*  K 3.8 3.7  CL 103 105  CO2 25 22  GLUCOSE 113* 146*  BUN 15 9  CREATININE 0.52 0.52  CALCIUM 8.3* 7.7*  MG 1.8 1.6*  PHOS 3.2  --    GFR: Estimated Creatinine Clearance: 37.6 mL/min (by C-G formula based on SCr of 0.52 mg/dL). Liver Function Tests: Recent Labs  Lab 06/03/21 2242 06/04/21 0246  AST 21  --   ALT 12  --   ALKPHOS 67  --   BILITOT 1.2  --   PROT 5.9*  --   ALBUMIN 3.3* 3.2*   No results for input(s): LIPASE, AMYLASE in the last 168 hours. No results for input(s): AMMONIA in the last 168 hours. Coagulation Profile: Recent Labs  Lab 06/04/21 0246  INR 1.2   Cardiac Enzymes: Recent Labs  Lab 06/03/21 2242  CKTOTAL 61   BNP (last 3 results) No results for input(s): PROBNP in the last 8760 hours. HbA1C: No results for input(s): HGBA1C in the last 72 hours. CBG: No results for input(s): GLUCAP in the last 168 hours. Lipid Profile: No results for input(s): CHOL, HDL, LDLCALC, TRIG, CHOLHDL, LDLDIRECT in the last 72 hours. Thyroid Function Tests: Recent Labs    06/03/21 2358  TSH 4.276   Anemia Panel: Recent Labs    06/04/21 0246  VITAMINB12 178*  FOLATE 21.9  FERRITIN 157  TIBC 234*  IRON 19*  RETICCTPCT 1.1   Sepsis Labs: No results for input(s): PROCALCITON, LATICACIDVEN in the last 168 hours.  Recent Results (from the past 240 hour(s))  Surgical pcr screen      Status: None   Collection Time: 06/04/21  1:30 AM   Specimen: Nasal Mucosa; Nasal Swab  Result Value Ref Range Status   MRSA, PCR NEGATIVE NEGATIVE Final   Staphylococcus aureus NEGATIVE NEGATIVE Final    Comment: (NOTE) The Xpert SA Assay (FDA approved for NASAL specimens in patients 51 years of age and older), is one component of a comprehensive surveillance program. It is not intended to diagnose infection nor to guide or monitor treatment. Performed at [facility], [place] [address] [place], [place] [address]          Radiology  Studies: DG Knee 2 Views Left  Result Date: 06/03/2021 CLINICAL DATA:  Fall, left knee pain EXAM: LEFT KNEE - 1-2 VIEW COMPARISON:  None Available. FINDINGS: Osseous structures are diffusely osteopenic. Normal alignment. No acute fracture or dislocation. 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 Patellar ORIF has been performed with 2 partially threaded log tendon screws noted. There is thickening of the quadriceps tendon which may be post inflammatory or posttraumatic in nature. Soft tissues are otherwise unremarkable. No effusion. IMPRESSION: No acute abnormality. Electronically Signed   By: Fidela Salisbury M.D.   On: 06/03/2021 22:05   CT Head Wo Contrast  Result Date: 06/04/2021 CLINICAL DATA:  [place] hit head, elderly EXAM: CT HEAD WITHOUT CONTRAST TECHNIQUE: Contiguous axial images were obtained from the base of the skull through the vertex without intravenous contrast. RADIATION DOSE REDUCTION: This exam was performed according to the departmental dose-optimization program which includes automated exposure control, adjustment of the mA and/or kV according to patient size and/or use of iterative reconstruction technique. COMPARISON:  11/13/2020 FINDINGS: Brain: No evidence of acute infarction, hemorrhage, cerebral edema, mass, mass effect, or midline shift. No hydrocephalus or extra-axial fluid collection.  Periventricular white matter changes, likely the sequela of chronic small vessel ischemic disease. Degree of cerebral atrophy is within normal limits for age. Vascular: No hyperdense vessel. Atherosclerotic calcifications in the intracranial carotid and vertebral arteries. Skull: Normal. Negative for fracture or focal lesion. Sinuses/Orbits: No acute finding. Other: The mastoid air cells are well aerated. IMPRESSION: No acute intracranial process. Electronically Signed   By: Merilyn Baba M.D.   On: 06/04/2021 00:07   DG CHEST PORT 1 VIEW  Result Date: 06/04/2021 CLINICAL DATA:  Preop chest, hip fracture EXAM: PORTABLE CHEST 1 VIEW COMPARISON:  04/27/2017 FINDINGS: Lungs are clear.  No pleural effusion or pneumothorax. The heart is top-normal in size. Descending thoracic aortic stent graft. IMPRESSION: No evidence of acute cardiopulmonary disease. Electronically Signed   By: Julian Hy M.D.   On: 06/04/2021 00:16   ECHOCARDIOGRAM COMPLETE  Result Date: 06/04/2021    ECHOCARDIOGRAM REPORT   Patient Name:   CATON WOHLWEND Date of Exam: 06/04/2021 Medical Rec #:  [id number]  Height:       60.0 in Accession #:    [id number] Weight:       110.0 lb Date of Birth:  07-17-1935  BSA:          1.448 m Patient Age:    30 years   BP:           155/55 mmHg Patient Gender: F          HR:           67 bpm. Exam Location:  Inpatient Procedure: 2D Echo, Color Doppler, Cardiac Doppler and Strain Analysis Indications:    murmur  History:        Patient has no prior history of Echocardiogram examinations.                 Signs/Symptoms:Murmur; Risk Factors:Hypertension.  Sonographer:    Luisa Hart RDCS Referring Phys: [id number] ANASTASSIA DOUTOVA IMPRESSIONS  1. LV apical false tendon (normal variant). Left ventricular ejection fraction, by estimation, is 60 to 65%. Left ventricular ejection fraction by PLAX is 60 %. The left ventricle has normal function. The left ventricle has no regional wall motion abnormalities. Left  ventricular diastolic parameters are consistent with Grade I diastolic dysfunction (impaired relaxation).  2. Right ventricular systolic function is normal. The right  ventricular size is normal.  3. Left atrial size was mildly dilated.  4. The mitral valve is abnormal. Trivial mitral valve regurgitation.  5. The aortic valve is tricuspid. There is moderate calcification of the aortic valve. Aortic valve regurgitation is moderate. Mild to moderate aortic valve stenosis. Aortic regurgitation PHT measures 407 msec. Aortic valve area, by VTI measures 1.42 cm. Aortic valve mean gradient measures 8.0 mmHg. Aortic valve Vmax measures 1.98 m/s. Peak gradient 15.6 mmHg. Sharma Covert is 0.45. Comparison(s): No prior Echocardiogram. FINDINGS  Left Ventricle: LV apical false tendon (normal variant). Left ventricular ejection fraction, by estimation, is 60 to 65%. Left ventricular ejection fraction by PLAX is 60 %. The left ventricle has normal function. The left ventricle has no regional wall  motion abnormalities. The left ventricular internal cavity size was normal in size. There is no left ventricular hypertrophy. Left ventricular diastolic parameters are consistent with Grade I diastolic dysfunction (impaired relaxation). Normal left ventricular filling pressure. Right Ventricle: The right ventricular size is normal. No increase in right ventricular wall thickness. Right ventricular systolic function is normal. Left Atrium: Left atrial size was mildly dilated. Right Atrium: Right atrial size was normal in size. Pericardium: There is no evidence of pericardial effusion. Mitral Valve: The mitral valve is abnormal. There is mild thickening of the anterior and posterior mitral valve leaflet(s). Trivial mitral valve regurgitation. MV peak gradient, 5.1 mmHg. The mean mitral valve gradient is 2.0 mmHg. Tricuspid Valve: The tricuspid valve is grossly normal. Tricuspid valve regurgitation is trivial. Aortic Valve: The aortic valve is  tricuspid. There is moderate calcification of the aortic valve. Aortic valve regurgitation is moderate. Aortic regurgitation PHT measures 407 msec. Mild to moderate aortic stenosis is present. Aortic valve mean gradient measures 8.0 mmHg. Aortic valve peak gradient measures 15.6 mmHg. Aortic valve area, by VTI measures 1.42 cm. Pulmonic Valve: The pulmonic valve was normal in structure. Pulmonic valve regurgitation is not visualized. Aorta: The aortic root and ascending aorta are structurally normal, with no evidence of dilitation. Venous: The inferior vena cava was not well visualized. IAS/Shunts: No atrial level shunt detected by color flow Doppler.  LEFT VENTRICLE PLAX 2D LV EF:         Left            Diastology                ventricular     LV e' medial:    4.67 cm/s                ejection        LV E/e' medial:  7.6                fraction by     LV e' lateral:   5.08 cm/s                PLAX is 60      LV E/e' lateral: 7.0                %. LVIDd:         5.00 cm LVIDs:         3.40 cm LV PW:         1.30 cm LV IVS:        1.00 cm LVOT diam:     2.00 cm LV SV:         58 LV SV Index:   40 LVOT Area:     3.14 cm  LV Volumes (MOD) LV vol d, MOD    82.7 ml A2C: LV vol d, MOD    83.3 ml A4C: LV vol s, MOD    30.3 ml A2C: LV vol s, MOD    38.2 ml A4C: LV SV MOD A2C:   52.4 ml LV SV MOD A4C:   83.3 ml LV SV MOD BP:    48.4 ml RIGHT VENTRICLE RV Basal diam:  3.50 cm RV Mid diam:    2.00 cm RV S prime:     13.20 cm/s TAPSE (M-mode): 2.2 cm LEFT ATRIUM             Index        RIGHT ATRIUM           Index LA Vol (A2C):   33.1 ml 22.86 ml/m  RA Area:     15.80 cm LA Vol (A4C):   49.9 ml 34.47 ml/m  RA Volume:   38.50 ml  26.59 ml/m LA Biplane Vol: 43.9 ml 30.32 ml/m  AORTIC VALVE                     PULMONIC VALVE AV Area (Vmax):    1.32 cm      PV Vmax:       1.15 m/s AV Area (Vmean):   1.31 cm      PV Vmean:      74.800 cm/s AV Area (VTI):     1.42 cm      PV VTI:        0.239 m AV Vmax:           197.50  cm/s   PV Peak grad:  5.3 mmHg AV Vmean:          129.000 cm/s  PV Mean grad:  3.0 mmHg AV VTI:            0.410 m AV Peak Grad:      15.6 mmHg AV Mean Grad:      8.0 mmHg LVOT Vmax:         82.90 cm/s LVOT Vmean:        53.800 cm/s LVOT VTI:          0.185 m LVOT/AV VTI ratio: 0.45 AI PHT:            407 msec  AORTA Ao Root diam: 3.60 cm Ao Asc diam:  3.70 cm MITRAL VALVE               TRICUSPID VALVE MV Area (PHT): 2.73 cm    TR Peak grad:   33.6 mmHg MV Area VTI:   2.24 cm    TR Vmax:        290.00 cm/s MV Peak grad:  5.1 mmHg MV Mean grad:  2.0 mmHg    SHUNTS MV Vmax:       1.13 m/s    Systemic VTI:  0.18 m MV Vmean:      55.9 cm/s   Systemic Diam: 2.00 cm MV Decel Time: 278 msec MV E velocity: 35.40 cm/s MV A velocity: 58.20 cm/s MV E/A ratio:  0.61 Lyman Bishop MD Electronically signed by Lyman Bishop MD Signature Date/Time: 06/04/2021/11:14:53 AM    Final    DG Hip Unilat W or Wo Pelvis 2-3 Views Left  Result Date: 06/03/2021 CLINICAL DATA:  Fall, left hip pain EXAM: DG HIP (WITH OR WITHOUT PELVIS) 2-3V LEFT COMPARISON:  None Available. FINDINGS: Osseous structures are diffusely osteopenic. There  is an acute, mildly comminuted intratrochanteric fracture of the left hip with override and external rotation of the distal fracture fragment. Femoral head appears seated within the left acetabulum. Left hip joint space is preserved. Limited evaluation of the right hip is unremarkable. Vascular calcifications are noted. IMPRESSION: Acute mildly comminuted left intratrochanteric hip fracture. Osteopenia. Electronically Signed   By: Fidela Salisbury M.D.   On: 06/03/2021 22:06   DG FEMUR MIN 2 VIEWS LEFT  Result Date: 06/04/2021 CLINICAL DATA:  Postoperative check. EXAM: LEFT FEMUR 2 VIEWS COMPARISON:  Left hip x-ray 06/03/2021. FINDINGS: There is a new left femoral intramedullary nail and hip screw fixating intratrochanteric fracture. Alignment is anatomic. No dislocation. There is lateral soft tissue  swelling and air compatible with recent surgery. There are 2 orthopedic screws visualized in the patella. IMPRESSION: 1. ORIF left femoral intratrochanteric fracture. Alignment is anatomic. 2. Recent postoperative changes. Electronically Signed   By: Ronney Asters M.D.   On: 06/04/2021 19:19   DG FEMUR MIN 2 VIEWS LEFT  Result Date: 06/04/2021 CLINICAL DATA:  Left femur nail fixation EXAM: LEFT FEMUR 2 VIEWS COMPARISON:  None Available. FLUOROSCOPY: Air kerma 27.2 mGy FINDINGS: Intraoperative fluoroscopic images of the left femur demonstrate intramedullary nail fixation of intratrochanteric fractures. IMPRESSION: Intraoperative fluoroscopic images of the left femur demonstrate intramedullary nail fixation of intratrochanteric fractures. Electronically Signed   By: Delanna Ahmadi M.D.   On: 06/04/2021 15:15        Scheduled Meds:  acetaminophen  500 mg Oral Q6H   docusate sodium  100 mg Oral BID   enoxaparin (LOVENOX) injection  40 mg Subcutaneous Q24H   feeding supplement  237 mL Oral BID BM   Continuous Infusions:  sodium chloride     magnesium sulfate bolus IVPB     methocarbamol (ROBAXIN) IV       LOS: 2 days    Time spent: 35 minutes    Kaide Gage Darleen Crocker, DO [facility]  If 7PM-7AM, please contact night-coverage [email or web address] 06/05/2021, 9:13 AM

## 2021-06-05 NOTE — Evaluation (Signed)
Occupational Therapy Evaluation Patient Details Name: Stephanie Collier MRN: [id number] DOB: 11-24-1935 Today's Date: 06/05/2021   History of Present Illness Stephanie Collier is a 86 y.o. female with medical history significant of  Thoracic aortic aneurysm, COPD, and iliac artery aneurysm. Presented with a fall and is now noted to have a left nondisplaced intertrochanteric fracture.  She is noted to have a cardiac murmur with 2D echocardiogram pending. Patient is s/p ORIF L hip 06/04/21. Recent patella ORIF on left as well.   Clinical Impression   Patient admitted for the diagnosis and subsequent procedure above.  L leg pain and weakness are significant deficits impacting independence at this point.  She is currently needing heavy Mod A for bed mobility, and Max A for lower body ADL completion at bedlevel.  Although she lives with her daughter, the patient currently would not have the appropriate level of assist to transition home safely.  She would need 24 hour +2 assist.  OT will follow in the acute setting, but SNF is recommended for post acute rehab prior to returning home.       Recommendations for follow up therapy are one component of a multi-disciplinary discharge planning process, led by the attending physician.  Recommendations may be updated based on patient status, additional functional criteria and insurance authorization.   Follow Up Recommendations  Skilled nursing-short term rehab (<3 hours/day)    Assistance Recommended at Discharge Frequent or constant Supervision/Assistance  Patient can return home with the following A lot of help with bathing/dressing/bathroom;Two people to help with walking and/or transfers;Help with stairs or ramp for entrance;Assist for transportation;Assistance with cooking/housework    Functional Status Assessment  Patient has had a recent decline in their functional status and demonstrates the ability to make significant improvements in function in a reasonable and  predictable amount of time.  Equipment Recommendations  Hospital bed;BSC/3in1;Wheelchair (measurements OT);Wheelchair cushion (measurements OT)    Recommendations for Other Services       Precautions / Restrictions Precautions Precautions: Fall Restrictions Weight Bearing Restrictions: Yes LLE Weight Bearing: Weight bearing as tolerated      Mobility Bed Mobility Overal bed mobility: Needs Assistance Bed Mobility: Supine to Sit     Supine to sit: Mod assist, HOB elevated          Transfers                          Balance Overall balance assessment: Needs assistance Sitting-balance support: Feet supported Sitting balance-Leahy Scale: Fair   Postural control: Posterior lean, Right lateral lean                                 ADL either performed or assessed with clinical judgement   ADL       Grooming: Wash/dry hands;Wash/dry face;Set up;Bed level   Upper Body Bathing: Minimal assistance;Bed level   Lower Body Bathing: Bed level;Maximal assistance   Upper Body Dressing : Minimal assistance;Bed level   Lower Body Dressing: Maximal assistance;Bed level   Toilet Transfer: Total assistance                   Vision Patient Visual Report: No change from baseline       Perception Perception Perception: Not tested   Praxis Praxis Praxis: Not tested    Pertinent Vitals/Pain Pain Assessment Pain Assessment: Faces Faces Pain Scale: Hurts even more Pain Location: L  leg Pain Descriptors / Indicators: Discomfort, Sore, Grimacing, Guarding Pain Intervention(s): Monitored during session, Premedicated before session     Hand Dominance Right   Extremity/Trunk Assessment Upper Extremity Assessment Upper Extremity Assessment: Overall WFL for tasks assessed   Lower Extremity Assessment Lower Extremity Assessment: Defer to PT evaluation       Communication Communication Communication: No difficulties   Cognition  Arousal/Alertness: Awake/alert Behavior During Therapy: WFL for tasks assessed/performed, Agitated Overall Cognitive Status: Within Functional Limits for tasks assessed                                                        Home Living Family/patient expects to be discharged to:: Private residence Living Arrangements: Children Available Help at Discharge: Family;Available 24 hours/day Type of Home: House Home Access: Stairs to enter [employer] of Steps: 3 Entrance Stairs-Rails: Left Home Layout: One level     Bathroom Shower/Tub: Tub/shower unit;Walk-in shower   Bathroom Toilet: Standard Bathroom Accessibility: Yes How Accessible: Accessible via walker Home Equipment: Rolling Walker (2 wheels);Rollator (4 wheels)          Prior Functioning/Environment Prior Level of Function : Independent/Modified Independent;History of Falls (last six months)             Mobility Comments: uses rollator for mobility or RW ADLs Comments: mod independent for ADL and light home management at RW level        OT Problem List: Decreased strength;Decreased range of motion;Decreased activity tolerance;Impaired balance (sitting and/or standing);Pain      OT Treatment/Interventions: Therapeutic exercise;Self-care/ADL training;Therapeutic activities;Balance training;Patient/family education    OT Goals(Current goals can be found in the care plan section) Acute Rehab OT Goals Patient Stated Goal: Go home OT Goal Formulation: With patient Time For Goal Achievement: 06/19/21 Potential to Achieve Goals: Good ADL Goals Pt Will Perform Grooming: with set-up;sitting Pt Will Perform Upper Body Dressing: with set-up;sitting Pt Will Perform Lower Body Dressing: with mod assist;sit to/from stand;with adaptive equipment Pt Will Transfer to Toilet: with mod assist;stand pivot transfer;bedside commode  OT Frequency: Min 2X/week    Co-evaluation               AM-PAC OT "6 Clicks" Daily Activity     Outcome Measure Help from another person eating meals?: None Help from another person taking care of personal grooming?: None Help from another person toileting, which includes using toliet, bedpan, or urinal?: A Lot Help from another person bathing (including washing, rinsing, drying)?: A Lot Help from another person to put on and taking off regular upper body clothing?: A Little Help from another person to put on and taking off regular lower body clothing?: A Lot 6 Click Score: 17   End of Session Equipment Utilized During Treatment: Gait belt Nurse Communication: Mobility status  Activity Tolerance: Patient limited by pain Patient left: in bed;with call bell/phone within reach  OT Visit Diagnosis: Unsteadiness on feet (R26.81);Pain Pain - Right/Left: Left Pain - part of body: Leg                Time: 6759-1638 OT Time Calculation (min): 19 min Charges:  OT General Charges $OT Visit: 1 Visit OT Evaluation $OT Eval Moderate Complexity: 1 Mod  06/05/2021  RP, OTR/L  Acute Rehabilitation Services  Office:  [phone]   Suzanna Obey 06/05/2021, 4:26  PM

## 2021-06-06 DIAGNOSIS — E43 Unspecified severe protein-calorie malnutrition: Secondary | ICD-10-CM | POA: Insufficient documentation

## 2021-06-06 DIAGNOSIS — I10 Essential (primary) hypertension: Secondary | ICD-10-CM | POA: Diagnosis not present

## 2021-06-06 DIAGNOSIS — R011 Cardiac murmur, unspecified: Secondary | ICD-10-CM | POA: Diagnosis not present

## 2021-06-06 DIAGNOSIS — S72002A Fracture of unspecified part of neck of left femur, initial encounter for closed fracture: Secondary | ICD-10-CM | POA: Diagnosis not present

## 2021-06-06 DIAGNOSIS — D649 Anemia, unspecified: Secondary | ICD-10-CM

## 2021-06-06 DIAGNOSIS — I7121 Aneurysm of the ascending aorta, without rupture: Secondary | ICD-10-CM | POA: Diagnosis not present

## 2021-06-06 LAB — MAGNESIUM: Magnesium: 2 mg/dL (ref 1.7–2.4)

## 2021-06-06 LAB — CBC
HCT: 25.1 % — ABNORMAL LOW (ref 36.0–46.0)
Hemoglobin: 8.2 g/dL — ABNORMAL LOW (ref 12.0–15.0)
MCH: 30.8 pg (ref 26.0–34.0)
MCHC: 32.7 g/dL (ref 30.0–36.0)
MCV: 94.4 fL (ref 80.0–100.0)
Platelets: 104 10*3/uL — ABNORMAL LOW (ref 150–400)
RBC: 2.66 MIL/uL — ABNORMAL LOW (ref 3.87–5.11)
RDW: 14.6 % (ref 11.5–15.5)
WBC: 5.8 10*3/uL (ref 4.0–10.5)
nRBC: 0 % (ref 0.0–0.2)

## 2021-06-06 LAB — BASIC METABOLIC PANEL
Anion gap: 5 (ref 5–15)
BUN: 10 mg/dL (ref 8–23)
CO2: 24 mmol/L (ref 22–32)
Calcium: 7.7 mg/dL — ABNORMAL LOW (ref 8.9–10.3)
Chloride: 104 mmol/L (ref 98–111)
Creatinine, Ser: 0.45 mg/dL (ref 0.44–1.00)
GFR, Estimated: >60 mL/min (ref >60)
Glucose, Bld: 116 mg/dL — ABNORMAL HIGH (ref 70–99)
Potassium: 3.6 mmol/L (ref 3.5–5.1)
Sodium: 133 mmol/L — ABNORMAL LOW (ref 135–145)

## 2021-06-06 MED ORDER — POLYETHYLENE GLYCOL 3350 17 G PO PACK
17.0000 g | PACK | Freq: Every day | ORAL | Status: DC
  Administered 2021-06-06 – 2021-06-08 (×3): 17 g via ORAL
  Filled 2021-06-06 (×4): qty 1

## 2021-06-06 NOTE — TOC Progression Note (Signed)
Transition of Care [facility]) - Progression Note    Patient Details  Name: Stephanie Collier MRN: [id number] Date of Birth: 09/15/1935  Transition of Care [facility]) CM/SW Contact  Ina Homes, [place] Phone Number: 06/06/2021, 12:24 PM  Clinical Narrative:     SW spoke with [name] [phone]) via phone. [name] currently with pt at bedside. Pt participated in conversation. [name] reports health issues that limit her ability to lift heavy. [name] reports if she is able to safely transfer pt and has the necessary DME at home (I.e [facility], w//c if qualifies) she plans to bring pt home. [name] reports if not would prefer rehab. [name] inquired about CIR, SW explained pt would need to be able to tolerate 3 hours of therapy /day, pt laughed in background, confirmed she could not do that. SW informed of recs for SNF, which is typically therapy 30 mins to 1 hour of therapy 5 days a week. (Varies per facility, pt tolerance and insurance). SW read closest facilities and their ratings from [facility] site to [place], as she would prefer 4-5 star facilities. SW explained [facility] is 4 and [facility] is 3, remainder of facilities with 4-5 stars are outside of [place]. [name] reports she cannot drive that far and would not want pt that far from home. [name] plans to speak with [name] to assist with showing appropriate ways to transfer.  Update 145pm SW met with pt and daughter [name] at bedside. SW provided physical copy of [facility] SNF list. [name] reports agreeable to SNF further out, she will just ask family for money for ubers if she can't drive. [name] reports pt's other daughter will be here this week for a couple days to assist. [name] reports if she is able to transfer pt, will want to d/c home with HHPT/OT and r/w, BSC. Current r/w is broken and was received 5+ years ago. SW explained difficulty getting in touch with admissions offices over holiday weekend but will send out SNF referrals.  [name] in agreement.   Expected Discharge Plan: [place] Barriers to Discharge: Continued Medical Work up  Expected Discharge Plan and Services Expected Discharge Plan: [place] In-house Referral: Clinical Social Work   Post Acute Care Choice: [place] arrangements for the past 2 months: Single Family Home                                       Social Determinants of Health (SDOH) Interventions    Readmission Risk Interventions     View : No data to display.

## 2021-06-06 NOTE — Progress Notes (Signed)
Subjective: 2 Days Post-Op Procedure(s) (LRB): INTRAMEDULLARY (IM) NAIL INTERTROCHANTRIC (Left) Patient reports pain as moderate.  She complains of some pain in her left knee.  She was out of bed to the chair yesterday.  No dizziness or shortness of breath.  Objective: Vital signs in last 24 hours: Temp:  [98.4 F (36.9 C)-98.8 F (37.1 C)] 98.8 F (37.1 C) (05/27 0451) Pulse Rate:  [74-75] 75 (05/27 0451) Resp:  [13-16] 13 (05/27 0451) BP: (139-140)/(56-74) 139/74 (05/27 0451) SpO2:  [96 %-100 %] 96 % (05/27 0451)  Intake/Output from previous day: 05/26 0701 - 05/27 0700 In: 511.3 [P.O.:240; IV Piggyback:271.3] Out: 400 [Urine:400] Intake/Output this shift: No intake/output data recorded.  Recent Labs    06/03/21 2242 06/05/21 0116 06/06/21 0226  HGB 11.6* 9.0* 8.2*   Recent Labs    06/05/21 0116 06/06/21 0226  WBC 5.1 5.8  RBC 2.96* 2.66*  HCT 28.3* 25.1*  PLT 103* 104*   Recent Labs    06/05/21 0116 06/06/21 0226  NA 131* 133*  K 3.7 3.6  CL 105 104  CO2 22 24  BUN 9 10  CREATININE 0.52 0.45  GLUCOSE 146* 116*  CALCIUM 7.7* 7.7*   Recent Labs    06/04/21 0246  INR 1.2  X-rays including AP, lateral, and sunrise views of the left knee show hardware fixation with 2 screws.  Patella fracture looks completely healed.  No adverse features noted  Left hip exam: Aquacel dressing is clean and dry.  She has some mild swelling in her left thigh.  Her left calf is soft and nontender.  She has excellent ankle plantar and dorsiflexor strength. Left knee exam: She has a midline well-healed scar over her patella.  No effusion.  Some parapatellar tenderness.  No ligamentous instability.     Assessment/Plan: 2 Days Post-Op Procedure(s) (LRB): INTRAMEDULLARY (IM) NAIL INTERTROCHANTRIC (Left) Postoperative blood loss anemia.  Currently asymptomatic. Plan: Up with therapy weight-bearing as tolerated on left lower extremity. Continue subcu Lovenox 40 mg daily x6  weeks postop for DVT prophylaxis. If left knee continues to hurt in the future can get a left knee injection with cortisone on an outpatient basis. Patient's daughter is asking about getting a CIR consult. Most likely patient will need SNF. Check CBC in a.m. Will follow.   Matthew Folks 06/06/2021, 12:18 PM

## 2021-06-06 NOTE — Progress Notes (Signed)
Physical Therapy Treatment Patient Details Name: Stephanie CarboJanice Mikles MRN: [id number] DOB: September 03, 1935 Today's Date: 06/06/2021   History of Present Illness Stephanie Collier is a 86 y.o. female with medical history significant of  Thoracic aortic aneurysm, COPD, and iliac artery aneurysm. Presented with a fall and is now noted to have a left nondisplaced intertrochanteric fracture.  She is noted to have a cardiac murmur with 2D echocardiogram pending. Patient is s/p ORIF L hip 06/04/21. Recent patella ORIF on left as well.    PT Comments    PT works with pt and daughter on functional mobility, providing education and a chance for daughter to trial assisting patient to determine if home is a viable option or if pt needs to go to rehab.  Pt and daughter work well together to get to EOB, but pt becomes frustrated with attempts to stand with daughter's assist and states she does not want to transfer to the chair today.  Pt has inc c/o pain despite being premedicated.  Pt expresses that she feels rehab prior to D/C home is necessary and would prefer to work on therex today than transfers to chair.  Pt is educated and demos good tolerance to initiation of seated therex with AA.   Recommendations for follow up therapy are one component of a multi-disciplinary discharge planning process, led by the attending physician.  Recommendations may be updated based on patient status, additional functional criteria and insurance authorization.  Follow Up Recommendations  Skilled nursing-short term rehab (<3 hours/day)     Assistance Recommended at Discharge Frequent or constant Supervision/Assistance  Patient can return home with the following A lot of help with walking and/or transfers;A lot of help with bathing/dressing/bathroom;Assistance with cooking/housework;Help with stairs or ramp for entrance;Assist for transportation   Equipment Recommendations  Rolling walker (2 wheels)    Recommendations for Other Services        Precautions / Restrictions Precautions Precautions: Fall Restrictions Weight Bearing Restrictions: Yes LLE Weight Bearing: Weight bearing as tolerated     Mobility  Bed Mobility Overal bed mobility: Needs Assistance Bed Mobility: Supine to Sit     Supine to sit: Min assist     General bed mobility comments: Pt's daughter assists with L LE negotiation off of EOB.  Pt req's HHA to sit upright.  Pt demos difficulty scooting forward and use of chuck pad is required to bring pelvis over to EOB.  Pt c/o inc LE pain in sitting and is educated to rest foot on floor for support.  Demos good sitting balance.  Returns back to bed with daughter assisting with L LE.  Mod A to scoot up in bed with use of chuck pad. Patient Response: Cooperative  Transfers Overall transfer level: Needs assistance Equipment used: Rolling walker (2 wheels) Transfers: Sit to/from Stand             General transfer comment: Attempts sit > stand x 2 with daughter wanting to assist pt.  PT provides education on gait belt, safety with hand placement, etc.  Pt and daughter attempt to work together but pt becomes increasingly frustrated and c/o pain with activity and does not wish to continue.  PT offers to show them another transfer technique with daughter and/or PT standing in front of the pt without the use of RW, but pt/daughter decline at this time.  They both agree that rehab would be a better option.  Discuss the difference between AIR and SNF and pt/daughter want to go to SNF.  Pt requests to not do transfer at this time secondary to pain and asks if she can do LE therex instead.    Ambulation/Gait                   Stairs             Wheelchair Mobility    Modified Rankin (Stroke Patients Only)       Balance   Sitting-balance support: Bilateral upper extremity supported, Feet supported Sitting balance-Leahy Scale: Fair                                      Cognition  Arousal/Alertness: Awake/alert Behavior During Therapy: WFL for tasks assessed/performed Overall Cognitive Status: Within Functional Limits for tasks assessed                                 General Comments: Pt is supine in bed when PT arrives.  Daughter present in room.  Daughter and pt would like to try to see if they can transfer to chair just with PT guidance to determine if they can return home vs needing rehab.        Exercises General Exercises - Lower Extremity Ankle Circles/Pumps: AROM, Both, 20 reps Long Arc Quad: AAROM, Left, 20 reps Hip Flexion/Marching: AAROM, Left, 20 reps    General Comments        Pertinent Vitals/Pain Pain Assessment Pain Assessment: 0-10 Pain Score: 5  Pain Location: L LE Pain Descriptors / Indicators: Aching, Tender, Discomfort Pain Intervention(s): Premedicated before session, Limited activity within patient's tolerance, Monitored during session    Home Living                          Prior Function            PT Goals (current goals can now be found in the care plan section) Progress towards PT goals: Not progressing toward goals - comment (Pt experiencing inc pain and becomes increasingly frustrated.  Does not stand or transfer today.  Pt requests to work on therex instead.)    Frequency    Min 3X/week      PT Plan Current plan remains appropriate    Co-evaluation              AM-PAC PT "6 Clicks" Mobility   Outcome Measure  Help needed turning from your back to your side while in a flat bed without using bedrails?: A Little Help needed moving from lying on your back to sitting on the side of a flat bed without using bedrails?: A Little Help needed moving to and from a bed to a chair (including a wheelchair)?: A Lot Help needed standing up from a chair using your arms (e.g., wheelchair or bedside chair)?: A Lot Help needed to walk in hospital room?: Total Help needed climbing 3-5 steps with  a railing? : Total 6 Click Score: 12    End of Session Equipment Utilized During Treatment: Gait belt Activity Tolerance: Patient limited by pain;Treatment limited secondary to agitation Patient left: in bed;with bed alarm set;with call bell/phone within reach Nurse Communication: Mobility status       Time: 3419-6222 PT Time Calculation (min) (ACUTE ONLY): 40 min  Charges:  $Therapeutic Exercise: 8-22 mins $Therapeutic Activity: 23-37 mins  Ezriel Boffa A. Vijay Durflinger, PT, DPT Acute Rehabilitation Services Office: [phone]    Raisa Ditto A Karmen Altamirano 06/06/2021, 2:59 PM

## 2021-06-06 NOTE — Progress Notes (Signed)
Inpatient Rehab Admissions Coordinator:   Per provider request,  patient was screened for CIR candidacy by Megan Salon, MS, CCC-SLP . At this time, Pt. does not appear to demonstrate medical necessity to justify in hospital rehabilitation/CIR. will not pursue a rehab consult for this Pt. Additionally, Pt.'s payor will not approve for this dx.  Recommend other rehab venues to be pursued.  Please contact me with any questions.  Megan Salon, MS, CCC-SLP Rehab Admissions Coordinator  [phone] (celll) [phone] (office)

## 2021-06-06 NOTE — Progress Notes (Signed)
PROGRESS NOTE    Stephanie Collier  IRJ:[id number] DOB: February 01, 1935 DOA: 06/03/2021 PCP: Marden Noble, MD   Brief Narrative:  Stephanie Collier is a 86 y.o. female with medical history significant of  Thoracic aortic aneurysm, COPD, and iliac artery aneurysm. Presented with a fall and is now noted to have a left nondisplaced intertrochanteric fracture.  She is now status post left IM nail on 5/25 and PT evaluation is pending.  She has a cardiac murmur and 2D echocardiogram demonstrates moderate aortic valve regurgitation.  This can be further followed up in the outpatient setting.  Assessment & Plan:  Fall at home with Closed left hip fracture [facility]) -Status post IM nail 5/25 -Ortho recommended up with therapy weightbearing as tolerated on left lower extremity. -Lovenox 40 mg daily for 6 weeks postop for DVT prophylaxis as recommended per orthopedics -Keep Aquacel dressing and remove after 14 days -Follow-up with orthopedics in 2 weeks -Continue pain management  Cardiac murmur 2D echocardiogram done 5/25 with LVEF 60-65% with grade 1 diastolic dysfunction and moderate AV regurgitation -Can be followed outpatient   Prolonged QT interval -will monitor on tele avoid QT prolonging medications, rehydrate  -Magnesium replenished   Hypomagnesemia -Resolved   Hyponatremia -Improved from 131-133.  Continue to monitor   Acute blood loss anemia: -H&H dropped from 11.6-8.2. -Status post IV iron infusion x2. -Monitor H&H closely.  Transfuse as needed.   HTN (hypertension) -Currently stable.  Not on any medications at home.  Aortic aneurysm, thoracic (HCC) -Stable at baseline.  Disposition: Patient was screened for CIR-patient is not a candidate for CIR at this time.  PT OT recommended SNF.  Patient and her daughter like to go home with home health services.  TOC aware.  DVT prophylaxis: Lovenox Code Status: Full code Family Communication:  None present at bedside.  Plan of care discussed with  patient in length and she verbalized understanding and agreed with it. Disposition Plan: SNF  Consultants:  Orthopedic surgery  Procedures:  IM nail to left hip on 5/25  Antimicrobials:  Cefazolin  Status is: Inpatient     Subjective: Patient seen and examined.  Sitting comfortably on the bed.  Reports that she feels better this morning has mild pain 3 out of 10.  No fever.  No acute event overnight.    Objective: Vitals:   06/05/21 0549 06/05/21 0813 06/05/21 2028 06/06/21 0451  BP: (!) 140/55 (!) 147/59 (!) 140/56 139/74  Pulse: 72 74 74 75  Resp: 16 19 16 13   Temp: 98.1 F (36.7 C) 98.8 F (37.1 C) 98.4 F (36.9 C) 98.8 F (37.1 C)  TempSrc: Oral Oral Oral Oral  SpO2: 96% 96% 100% 96%  Weight:      Height:        Intake/Output Summary (Last 24 hours) at 06/06/2021 1247 Last data filed at 06/06/2021 06/08/2021 Gross per 24 hour  Intake 511.26 ml  Output 400 ml  Net 111.26 ml   Filed Weights   06/03/21 2112 06/04/21 1320  Weight: 49.9 kg 49.9 kg    Examination:  General exam: Appears calm and comfortable, on room air, communicating well Respiratory system: Clear to auscultation. Respiratory effort normal. Cardiovascular system: S1 & S2 heard, RRR. No JVD, No pedal edema. Gastrointestinal system: Abdomen is nondistended, soft and nontender. No organomegaly or masses felt. Normal bowel sounds heard. Central nervous system: Alert and oriented. No focal neurological deficits. Extremities: Left hip: No signs of active bleeding or discharge seen.  Dressing dry and intact  psychiatry: Judgement and insight appear normal. Mood & affect appropriate.    Data Reviewed: I have personally reviewed following labs and imaging studies  CBC: Recent Labs  Lab 06/03/21 2242 06/05/21 0116 06/06/21 0226  WBC 10.2 5.1 5.8  NEUTROABS 8.7*  --   --   HGB 11.6* 9.0* 8.2*  HCT 36.6 28.3* 25.1*  MCV 95.1 95.6 94.4  PLT 145* 103* 104*   Basic Metabolic Panel: Recent Labs   Lab 06/03/21 2242 06/05/21 0116 06/06/21 0226  NA 135 131* 133*  K 3.8 3.7 3.6  CL 103 105 104  CO2 25 22 24   GLUCOSE 113* 146* 116*  BUN 15 9 10   CREATININE 0.52 0.52 0.45  CALCIUM 8.3* 7.7* 7.7*  MG 1.8 1.6* 2.0  PHOS 3.2  --   --    GFR: Estimated Creatinine Clearance: 37.6 mL/min (by C-G formula based on SCr of 0.45 mg/dL). Liver Function Tests: Recent Labs  Lab 06/03/21 2242 06/04/21 0246  AST 21  --   ALT 12  --   ALKPHOS 67  --   BILITOT 1.2  --   PROT 5.9*  --   ALBUMIN 3.3* 3.2*   No results for input(s): LIPASE, AMYLASE in the last 168 hours. No results for input(s): AMMONIA in the last 168 hours. Coagulation Profile: Recent Labs  Lab 06/04/21 0246  INR 1.2   Cardiac Enzymes: Recent Labs  Lab 06/03/21 2242  CKTOTAL 61   BNP (last 3 results) No results for input(s): PROBNP in the last 8760 hours. HbA1C: No results for input(s): HGBA1C in the last 72 hours. CBG: No results for input(s): GLUCAP in the last 168 hours. Lipid Profile: No results for input(s): CHOL, HDL, LDLCALC, TRIG, CHOLHDL, LDLDIRECT in the last 72 hours. Thyroid Function Tests: Recent Labs    06/03/21 2358  TSH 4.276   Anemia Panel: Recent Labs    06/04/21 0246  VITAMINB12 178*  FOLATE 21.9  FERRITIN 157  TIBC 234*  IRON 19*  RETICCTPCT 1.1   Sepsis Labs: No results for input(s): PROCALCITON, LATICACIDVEN in the last 168 hours.  Recent Results (from the past 240 hour(s))  Surgical pcr screen     Status: None   Collection Time: 06/04/21  1:30 AM   Specimen: Nasal Mucosa; Nasal Swab  Result Value Ref Range Status   MRSA, PCR NEGATIVE NEGATIVE Final   Staphylococcus aureus NEGATIVE NEGATIVE Final    Comment: (NOTE) The Xpert SA Assay (FDA approved for NASAL specimens in patients 84 years of age and older), is one component of a comprehensive surveillance program. It is not intended to diagnose infection nor to guide or monitor treatment. Performed at [facility], [address] [place], [place] [address]       Radiology Studies: DG FEMUR MIN 2 VIEWS LEFT  Result Date: 06/04/2021 CLINICAL DATA:  Postoperative check. EXAM: LEFT FEMUR 2 VIEWS COMPARISON:  Left hip x-ray 06/03/2021. FINDINGS: There is a new left femoral intramedullary nail and hip screw fixating intratrochanteric fracture. Alignment is anatomic. No dislocation. There is lateral soft tissue swelling and air compatible with recent surgery. There are 2 orthopedic screws visualized in the patella. IMPRESSION: 1. ORIF left femoral intratrochanteric fracture. Alignment is anatomic. 2. Recent postoperative changes. Electronically Signed   By: Darliss CheneyAmy  Guttmann M.D.   On: 06/04/2021 19:19   DG FEMUR MIN 2 VIEWS LEFT  Result Date: 06/04/2021 CLINICAL DATA:  Left femur nail fixation EXAM: LEFT FEMUR 2 VIEWS COMPARISON:  None Available. FLUOROSCOPY: Air kerma 27.2 mGy FINDINGS: Intraoperative fluoroscopic images of the left femur demonstrate intramedullary nail fixation of intratrochanteric fractures. IMPRESSION: Intraoperative fluoroscopic images of the left femur demonstrate intramedullary nail fixation of intratrochanteric fractures. Electronically Signed   By: Jearld Lesch M.D.   On: 06/04/2021 15:15   DG Knee AP/LAT W/Sunrise Left  Result Date: 06/05/2021 CLINICAL DATA:  Fall, pain EXAM: LEFT KNEE 3 VIEWS COMPARISON:  06/03/2021 FINDINGS: Status post screw reduction of patellar fracture. No evidence of acute fracture, dislocation, or joint effusion. No evidence of arthropathy or other focal bone abnormality. Soft tissues are unremarkable. IMPRESSION: Status post screw reduction of patellar fracture. No evidence of acute fracture or dislocation of the left knee. Electronically Signed   By: Jearld Lesch M.D.   On: 06/05/2021 10:43    Scheduled Meds:  docusate sodium  100 mg Oral BID   enoxaparin (LOVENOX) injection  40 mg Subcutaneous Q24H   feeding supplement  237 mL Oral BID BM    polyethylene glycol  17 g Oral Daily   Continuous Infusions:  ferric gluconate (FERRLECIT) IVPB 250 mg (06/06/21 1118)   methocarbamol (ROBAXIN) IV       LOS: 3 days   Time spent: 35 minutes   Averianna Brugger Estill Cotta, MD [facility]  If 7PM-7AM, please contact night-coverage [email or web address] 06/06/2021, 12:47 PM

## 2021-06-06 NOTE — [place] FL2 (Signed)
[place] MEDICAID FL2 LEVEL OF CARE SCREENING TOOL     IDENTIFICATION  Patient Name: Stephanie Collier Birthdate: [birth year] Sex: female Admission Date (Current Location): 06/03/2021  [facility] and [place] Number:  Producer, television/film/video and Address:  [facility] [place], [address], [place], [place] [address]      Provider Number: [id number]  Attending Physician Name and Address:  Ollen Bowl, MD  Relative Name and Phone Number:  Vesper, Trant  [phone]    Current Level of Care: Hospital Recommended Level of Care: Skilled Nursing Facility Prior Approval Number:    Date Approved/Denied:   PASRR Number: [id number]  Discharge Plan: SNF    Current Diagnoses: Patient Active Problem List   Diagnosis Date Noted   Protein-calorie malnutrition, severe 06/06/2021   Cardiac murmur 06/04/2021   Fall at home, initial encounter 06/04/2021   Normocytic anemia 06/04/2021   Closed left hip fracture (HCC) 06/03/2021   Prolonged QT interval 06/03/2021   Family history of breast cancer 11/13/2018   Malnutrition of moderate degree (HCC) 04/09/2014   Aortic aneurysm, thoracic (HCC) 04/04/2014   Acute bronchitis 03/31/2014   HTN (hypertension) 03/31/2014   Descending thoracic aortic dissection (HCC)    UTI (lower urinary tract infection)     Orientation RESPIRATION BLADDER Height & Weight     Self, Time, Situation, Place  Normal Continent Weight: 110 lb 0.2 oz (49.9 kg) Height:  5' (152.4 cm)  BEHAVIORAL SYMPTOMS/MOOD NEUROLOGICAL BOWEL NUTRITION STATUS      Continent Diet (see discharge summary)  AMBULATORY STATUS COMMUNICATION OF NEEDS Skin   Extensive Assist Verbally Other (Comment), Surgical wounds (left thigh lateral incision)                       Personal Care Assistance Level of Assistance  Bathing, Dressing Bathing Assistance: Limited assistance   Dressing Assistance: Limited assistance     Functional Limitations Info              SPECIAL CARE FACTORS FREQUENCY  PT (By licensed PT), OT (By licensed OT)     PT Frequency: per facility OT Frequency: per facility            Contractures      Additional Factors Info  Code Status Code Status Info: FULL             Current Medications (06/06/2021):  This is the current hospital active medication list Current Facility-Administered Medications  Medication Dose Route Frequency Provider Last Rate Last Admin   acetaminophen (TYLENOL) tablet 325-650 mg  325-650 mg Oral Q6H PRN Ernestina Columbia, MD       docusate sodium (COLACE) capsule 100 mg  100 mg Oral BID Ernestina Columbia, MD       enoxaparin (LOVENOX) injection 40 mg  40 mg Subcutaneous Q24H Ernestina Columbia, MD   40 mg at 06/06/21 1213   feeding supplement (ENSURE ENLIVE / ENSURE PLUS) liquid 237 mL  237 mL Oral BID BM Shah, Pratik D, DO   237 mL at 06/06/21 1118   HYDROcodone-acetaminophen (NORCO) 7.5-325 MG per tablet 1-2 tablet  1-2 tablet Oral Q4H PRN Ernestina Columbia, MD       HYDROcodone-acetaminophen (NORCO/VICODIN) 5-325 MG per tablet 1-2 tablet  1-2 tablet Oral Q6H PRN Therisa Doyne, MD       HYDROcodone-acetaminophen (NORCO/VICODIN) 5-325 MG per tablet 1-2 tablet  1-2 tablet Oral Q4H PRN Ernestina Columbia, MD       menthol-cetylpyridinium (CEPACOL) lozenge  3 mg  1 lozenge Oral PRN Ernestina Columbia, MD       Or   phenol (CHLORASEPTIC) mouth spray 1 spray  1 spray Mouth/Throat PRN Ernestina Columbia, MD       methocarbamol (ROBAXIN) tablet 500 mg  500 mg Oral Q6H PRN Therisa Doyne, MD       Or   methocarbamol (ROBAXIN) 500 mg in dextrose 5 % 50 mL IVPB  500 mg Intravenous Q6H PRN Doutova, Anastassia, MD       morphine (PF) 2 MG/ML injection 0.5 mg  0.5 mg Intravenous Q2H PRN Doutova, Anastassia, MD   0.5 mg at 06/06/21 1241   morphine (PF) 2 MG/ML injection 0.5-1 mg  0.5-1 mg Intravenous Q2H PRN Ernestina Columbia, MD   1 mg at 06/06/21 0537   ondansetron (ZOFRAN) tablet 4 mg  4 mg Oral Q6H PRN Ernestina Columbia, MD       Or   ondansetron [facility]) injection 4 mg  4 mg Intravenous Q6H PRN Ernestina Columbia, MD   4 mg at 06/04/21 1830   polyethylene glycol (MIRALAX / GLYCOLAX) packet 17 g  17 g Oral Daily Pahwani, Rinka R, MD   17 g at 06/06/21 1212     Discharge Medications: Please see discharge summary for a list of discharge medications.  Relevant Imaging Results:  Relevant Lab Results:   Additional Information    Marya Landry Isa Hitz, LCSWA

## 2021-06-07 DIAGNOSIS — S72002A Fracture of unspecified part of neck of left femur, initial encounter for closed fracture: Secondary | ICD-10-CM | POA: Diagnosis not present

## 2021-06-07 LAB — BASIC METABOLIC PANEL
Anion gap: 3 — ABNORMAL LOW (ref 5–15)
BUN: 13 mg/dL (ref 8–23)
CO2: 27 mmol/L (ref 22–32)
Calcium: 7.8 mg/dL — ABNORMAL LOW (ref 8.9–10.3)
Chloride: 105 mmol/L (ref 98–111)
Creatinine, Ser: 0.47 mg/dL (ref 0.44–1.00)
GFR, Estimated: >60 mL/min (ref >60)
Glucose, Bld: 105 mg/dL — ABNORMAL HIGH (ref 70–99)
Potassium: 3.9 mmol/L (ref 3.5–5.1)
Sodium: 135 mmol/L (ref 135–145)

## 2021-06-07 LAB — CBC
HCT: 24.7 % — ABNORMAL LOW (ref 36.0–46.0)
Hemoglobin: 8 g/dL — ABNORMAL LOW (ref 12.0–15.0)
MCH: 30.7 pg (ref 26.0–34.0)
MCHC: 32.4 g/dL (ref 30.0–36.0)
MCV: 94.6 fL (ref 80.0–100.0)
Platelets: 122 10*3/uL — ABNORMAL LOW (ref 150–400)
RBC: 2.61 MIL/uL — ABNORMAL LOW (ref 3.87–5.11)
RDW: 15 % (ref 11.5–15.5)
WBC: 5.2 10*3/uL (ref 4.0–10.5)
nRBC: 0 % (ref 0.0–0.2)

## 2021-06-07 LAB — MAGNESIUM: Magnesium: 1.8 mg/dL (ref 1.7–2.4)

## 2021-06-07 MED ORDER — TRAMADOL HCL 50 MG PO TABS
50.0000 mg | ORAL_TABLET | Freq: Four times a day (QID) | ORAL | Status: DC | PRN
  Administered 2021-06-08: 50 mg via ORAL
  Filled 2021-06-07: qty 1

## 2021-06-07 MED ORDER — FERROUS SULFATE 325 (65 FE) MG PO TABS
325.0000 mg | ORAL_TABLET | Freq: Every day | ORAL | Status: DC
  Administered 2021-06-08 – 2021-06-09 (×2): 325 mg via ORAL
  Filled 2021-06-07 (×3): qty 1

## 2021-06-07 MED ORDER — BISACODYL 10 MG RE SUPP
10.0000 mg | Freq: Once | RECTAL | Status: AC
  Administered 2021-06-07: 10 mg via RECTAL
  Filled 2021-06-07: qty 1

## 2021-06-07 NOTE — Progress Notes (Signed)
PROGRESS NOTE    Stephanie Collier  SVX:[id number] DOB: 1935-03-17 DOA: 06/03/2021 PCP: Marden Noble, MD   Brief Narrative:  Stephanie Collier is a 86 y.o. female with medical history significant of  Thoracic aortic aneurysm, COPD, and iliac artery aneurysm. Presented with a fall and is now noted to have a left nondisplaced intertrochanteric fracture.  She is now status post left IM nail on 5/25 and PT evaluation is pending.  She has a cardiac murmur and 2D echocardiogram demonstrates moderate aortic valve regurgitation.  This can be further followed up in the outpatient setting.  Assessment & Plan:  Fall at home with Closed left hip fracture [facility]) -Status post IM nail 5/25 -Ortho recommended up with therapy weightbearing as tolerated on left lower extremity. -Lovenox 40 mg daily for 6 weeks postop for DVT prophylaxis as recommended per orthopedics -Keep Aquacel dressing and remove after 14 days -Follow-up with orthopedics in 2 weeks -Continue pain management -PT OT recommended SNF  Cardiac murmur 2D echocardiogram done 5/25 with LVEF 60-65% with grade 1 diastolic dysfunction and moderate AV regurgitation -Can be followed outpatient   Prolonged QT interval -will monitor on tele avoid QT prolonging medications, rehydrate  -Magnesium replenished   Acute blood loss anemia: -H&H dropped from 11.6-8.0. -Status post IV iron infusion x2.  Continue iron supplements daily -Monitor H&H closely.  Transfuse as needed.   HTN (hypertension) -Currently stable.  Not on any medications at home.  Aortic aneurysm, thoracic (HCC) -Stable at baseline.  Disposition: Patient was screened for CIR-patient is not a candidate for CIR at this time.  PT OT recommended SNF.  TOC consulted.  Patient and her daughter agreed to go to SNF-await bed availability  Hypomagnesemia -Resolved   Hyponatremia -Resolved  DVT prophylaxis: Lovenox Code Status: Full code Family Communication:  None present at bedside.  Plan  of care discussed with patient in length and she verbalized understanding and agreed with it.  I called patient's daughter on the phone and we discussed plan of care.  She agreed for the short-term rehab.  Disposition Plan: SNF  Consultants:  Orthopedic surgery  Procedures:  IM nail to left hip on 5/25  Antimicrobials:  Cefazolin  Status is: Inpatient     Subjective: Patient seen and examined.  Sitting comfortably on the bed and eating breakfast.  She denies any complaints today.  No fever.  No melena.  No acute events overnight.  Objective: Vitals:   06/05/21 2028 06/06/21 0451 06/06/21 2025 06/07/21 0431  BP: (!) 140/56 139/74 (!) 155/60 (!) 143/65  Pulse: 74 75 74 78  Resp: 16 13 16  (!) 22  Temp: 98.4 F (36.9 C) 98.8 F (37.1 C) 98.4 F (36.9 C) 97.8 F (36.6 C)  TempSrc: Oral Oral Oral Oral  SpO2: 100% 96% 96% 96%  Weight:      Height:        Intake/Output Summary (Last 24 hours) at 06/07/2021 1330 Last data filed at 06/07/2021 0459 Gross per 24 hour  Intake 271.86 ml  Output 1200 ml  Net -928.14 ml    Filed Weights   06/03/21 2112 06/04/21 1320  Weight: 49.9 kg 49.9 kg    Examination:  General exam: Appears calm and comfortable, on room air, communicating well Respiratory system: Clear to auscultation. Respiratory effort normal. Cardiovascular system: S1 & S2 heard, RRR. No JVD, No pedal edema. Gastrointestinal system: Abdomen is nondistended, soft and nontender. No organomegaly or masses felt. Normal bowel sounds heard. Central nervous system: Alert and oriented.  No focal neurological deficits. Extremities: Left hip: No signs of active bleeding or discharge seen.  Dressing dry and intact  psychiatry: Judgement and insight appear normal. Mood & affect appropriate.    Data Reviewed: I have personally reviewed following labs and imaging studies  CBC: Recent Labs  Lab 06/03/21 2242 06/05/21 0116 06/06/21 0226 06/07/21 0300  WBC 10.2 5.1 5.8  5.2  NEUTROABS 8.7*  --   --   --   HGB 11.6* 9.0* 8.2* 8.0*  HCT 36.6 28.3* 25.1* 24.7*  MCV 95.1 95.6 94.4 94.6  PLT 145* 103* 104* 122*    Basic Metabolic Panel: Recent Labs  Lab 06/03/21 2242 06/05/21 0116 06/06/21 0226 06/07/21 0300  NA 135 131* 133* 135  K 3.8 3.7 3.6 3.9  CL 103 105 104 105  CO2 25 22 24 27   GLUCOSE 113* 146* 116* 105*  BUN 15 9 10 13   CREATININE 0.52 0.52 0.45 0.47  CALCIUM 8.3* 7.7* 7.7* 7.8*  MG 1.8 1.6* 2.0 1.8  PHOS 3.2  --   --   --     GFR: Estimated Creatinine Clearance: 37.6 mL/min (by C-G formula based on SCr of 0.47 mg/dL). Liver Function Tests: Recent Labs  Lab 06/03/21 2242 06/04/21 0246  AST 21  --   ALT 12  --   ALKPHOS 67  --   BILITOT 1.2  --   PROT 5.9*  --   ALBUMIN 3.3* 3.2*    No results for input(s): LIPASE, AMYLASE in the last 168 hours. No results for input(s): AMMONIA in the last 168 hours. Coagulation Profile: Recent Labs  Lab 06/04/21 0246  INR 1.2    Cardiac Enzymes: Recent Labs  Lab 06/03/21 2242  CKTOTAL 61    BNP (last 3 results) No results for input(s): PROBNP in the last 8760 hours. HbA1C: No results for input(s): HGBA1C in the last 72 hours. CBG: No results for input(s): GLUCAP in the last 168 hours. Lipid Profile: No results for input(s): CHOL, HDL, LDLCALC, TRIG, CHOLHDL, LDLDIRECT in the last 72 hours. Thyroid Function Tests: No results for input(s): TSH, T4TOTAL, FREET4, T3FREE, THYROIDAB in the last 72 hours.  Anemia Panel: No results for input(s): VITAMINB12, FOLATE, FERRITIN, TIBC, IRON, RETICCTPCT in the last 72 hours.  Sepsis Labs: No results for input(s): PROCALCITON, LATICACIDVEN in the last 168 hours.  Recent Results (from the past 240 hour(s))  Surgical pcr screen     Status: None   Collection Time: 06/04/21  1:30 AM   Specimen: Nasal Mucosa; Nasal Swab  Result Value Ref Range Status   MRSA, PCR NEGATIVE NEGATIVE Final   Staphylococcus aureus NEGATIVE NEGATIVE Final     Comment: (NOTE) The Xpert SA Assay (FDA approved for NASAL specimens in patients 61 years of age and older), is one component of a comprehensive surveillance program. It is not intended to diagnose infection nor to guide or monitor treatment. Performed at [facility], [place] [address], [place], [place] [address]        Radiology Studies: No results found.  Scheduled Meds:  docusate sodium  100 mg Oral BID   enoxaparin (LOVENOX) injection  40 mg Subcutaneous Q24H   feeding supplement  237 mL Oral BID BM   [START ON 06/08/2021] ferrous sulfate  325 mg Oral Q breakfast   polyethylene glycol  17 g Oral Daily   Continuous Infusions:  methocarbamol (ROBAXIN) IV       LOS: 4 days   Time spent: 35  minutes   Mckinley Jewel, MD [facility]  If 7PM-7AM, please contact night-coverage [email or web address] 06/07/2021, 1:30 PM

## 2021-06-07 NOTE — Progress Notes (Addendum)
Subjective: 3 Days Post-Op Procedure(s) (LRB): INTRAMEDULLARY (IM) NAIL INTERTROCHANTRIC (Left) Patient reports pain as moderate.  Slow progress with physical therapy due to left hip pain.  Denies chest pain, shortness of breath, or dizziness.  Taking by mouth and voiding okay.  Daughter at bedside.  Objective: Vital signs in last 24 hours: Temp:  [97.8 F (36.6 C)-98.4 F (36.9 C)] 97.8 F (36.6 C) (05/28 0431) Pulse Rate:  [74-78] 78 (05/28 0431) Resp:  [16-22] 22 (05/28 0431) BP: (143-155)/(60-65) 143/65 (05/28 0431) SpO2:  [96 %] 96 % (05/28 0431)  Intake/Output from previous day: 05/27 0701 - 05/28 0700 In: 271.9 [IV Piggyback:271.9] Out: 1200 [Urine:1200] Intake/Output this shift: No intake/output data recorded.  Recent Labs    06/05/21 0116 06/06/21 0226 06/07/21 0300  HGB 9.0* 8.2* 8.0*   Recent Labs    06/06/21 0226 06/07/21 0300  WBC 5.8 5.2  RBC 2.66* 2.61*  HCT 25.1* 24.7*  PLT 104* 122*   Recent Labs    06/06/21 0226 06/07/21 0300  NA 133* 135  K 3.6 3.9  CL 104 105  CO2 24 27  BUN 10 13  CREATININE 0.45 0.47  GLUCOSE 116* 105*  CALCIUM 7.7* 7.8*   No results for input(s): LABPT, INR in the last 72 hours. Left hip exam: Neurovascular intact Sensation intact distally Dorsiflexion/Plantar flexion intact Incision: dressing C/D/I No cellulitis present   Assessment/Plan: 3 Days Post-Op Procedure(s) (LRB): INTRAMEDULLARY (IM) NAIL INTERTROCHANTRIC (Left) Up with therapy Postop blood loss anemia. Plan: Continue physical therapy weightbearing as tolerated on left lower extremity.  I encouraged the patient to get out of bed to the chair daily. Continue subcu Lovenox 40 mg daily for DVT prophylaxis.  Need to watch for any signs of active bleeding. I will order an incentive spirometer. I spoke with the daughter at length about pain management.  Will mostly use Tylenol and then tramadol for more acute pain. Ferrous sulfate 325 mg daily. Check  CBC in a.m. Will need skilled nursing facility. We will follow   Matthew Folks 06/07/2021, 12:56 PM

## 2021-06-08 DIAGNOSIS — R9431 Abnormal electrocardiogram [ECG] [EKG]: Secondary | ICD-10-CM | POA: Diagnosis not present

## 2021-06-08 DIAGNOSIS — I10 Essential (primary) hypertension: Secondary | ICD-10-CM | POA: Diagnosis not present

## 2021-06-08 DIAGNOSIS — I7121 Aneurysm of the ascending aorta, without rupture: Secondary | ICD-10-CM | POA: Diagnosis not present

## 2021-06-08 DIAGNOSIS — E43 Unspecified severe protein-calorie malnutrition: Secondary | ICD-10-CM

## 2021-06-08 DIAGNOSIS — S72002A Fracture of unspecified part of neck of left femur, initial encounter for closed fracture: Secondary | ICD-10-CM | POA: Diagnosis not present

## 2021-06-08 LAB — CBC WITH DIFFERENTIAL/PLATELET
Abs Immature Granulocytes: 0.03 10*3/uL (ref 0.00–0.07)
Basophils Absolute: 0 10*3/uL (ref 0.0–0.1)
Basophils Relative: 1 %
Eosinophils Absolute: 0.1 10*3/uL (ref 0.0–0.5)
Eosinophils Relative: 2 %
HCT: 26.5 % — ABNORMAL LOW (ref 36.0–46.0)
Hemoglobin: 8.3 g/dL — ABNORMAL LOW (ref 12.0–15.0)
Immature Granulocytes: 1 %
Lymphocytes Relative: 14 %
Lymphs Abs: 0.8 10*3/uL (ref 0.7–4.0)
MCH: 30.3 pg (ref 26.0–34.0)
MCHC: 31.3 g/dL (ref 30.0–36.0)
MCV: 96.7 fL (ref 80.0–100.0)
Monocytes Absolute: 0.5 10*3/uL (ref 0.1–1.0)
Monocytes Relative: 9 %
Neutro Abs: 4 10*3/uL (ref 1.7–7.7)
Neutrophils Relative %: 73 %
Platelets: 168 10*3/uL (ref 150–400)
RBC: 2.74 MIL/uL — ABNORMAL LOW (ref 3.87–5.11)
RDW: 14.9 % (ref 11.5–15.5)
WBC: 5.5 10*3/uL (ref 4.0–10.5)
nRBC: 0 % (ref 0.0–0.2)

## 2021-06-08 MED ORDER — HYDRALAZINE HCL 20 MG/ML IJ SOLN: 10.0000 mg | Freq: Four times a day (QID) | INTRAMUSCULAR | Status: DC | PRN

## 2021-06-08 NOTE — Progress Notes (Signed)
Subjective: 4 Days Post-Op Procedure(s) (LRB): INTRAMEDULLARY (IM) NAIL INTERTROCHANTRIC (Left) Patient reports pain as moderate.  Has not gotten out of bed to the chair.  Basically has just sat on the side of the bed.  Taking by mouth and voiding okay.  Daughter at bedside.  Objective: Vital signs in last 24 hours: Temp:  [97.8 F (36.6 C)-98.3 F (36.8 C)] 97.8 F (36.6 C) (05/29 0811) Pulse Rate:  [75-80] 76 (05/29 0811) Resp:  [19-22] 20 (05/29 0811) BP: (129-156)/(51-62) 150/62 (05/29 0811) SpO2:  [94 %-99 %] 98 % (05/29 0811)  Intake/Output from previous day: 05/28 0701 - 05/29 0700 In: -  Out: 200 [Urine:200] Intake/Output this shift: No intake/output data recorded.  Recent Labs    06/06/21 0226 06/07/21 0300 06/08/21 0156  HGB 8.2* 8.0* 8.3*   Recent Labs    06/07/21 0300 06/08/21 0156  WBC 5.2 5.5  RBC 2.61* 2.74*  HCT 24.7* 26.5*  PLT 122* 168   Recent Labs    06/06/21 0226 06/07/21 0300  NA 133* 135  K 3.6 3.9  CL 104 105  CO2 24 27  BUN 10 13  CREATININE 0.45 0.47  GLUCOSE 116* 105*  CALCIUM 7.7* 7.8*   No results for input(s): LABPT, INR in the last 72 hours. Left hip exam: Aquacel dressing clean and dry.  Minimal swelling in left thigh.  Left calf is soft and nontender.  Moves foot/ankle without difficulty.  Normal sensation distally.   Assessment/Plan: 4 Days Post-Op Procedure(s) (LRB): INTRAMEDULLARY (IM) NAIL INTERTROCHANTRIC (Left) Acute blood loss anemia, stable. Plan: Continue physical therapy weightbearing as tolerated on the left. Continue Lovenox subcu daily x6 weeks postop for DVT prophylaxis. Encouraged the patient to get out of bed to the chair today with PT. Starts to SNF hopefully tomorrow. Follow-up with Dr. Mable Fill at [facility] in 2 weeks   Erlene Senters 06/08/2021, 10:59 AM

## 2021-06-08 NOTE — TOC Progression Note (Addendum)
Transition of Care [facility]) - Progression Note    Patient Details  Name: Stephanie Collier MRN: [id number] Date of Birth: 08-11-1935  [facility] CM/SW Contact  [name], LCSW Phone Number: 06/08/2021, 11:19 AM  Clinical Narrative: CSW presented bed offers to pt and daughter [name] in room.  They want to discuss and are also requesting a response from [place].  CSW reached out to [name], unsure if she is in the office on Memorial day.     1345: Daughter [name] arrives at hospital, would like to accept offer at [facility].  Auth request submitted in [place].    Expected Discharge Plan: Home w Home Health Services Barriers to Discharge: Continued Medical Work up  Expected Discharge Plan and Services Expected Discharge Plan: Home w Home Health Services In-house Referral: Clinical Social Work   Post Acute Care Choice: Home Health Living arrangements for the past 2 months: Single Family Home                                       Social Determinants of Health (SDOH) Interventions    Readmission Risk Interventions     View : No data to display.

## 2021-06-08 NOTE — Progress Notes (Signed)
Patient stating has moderate 6/10 pain when moving.  RN educated about need for pain management and ice packs on L hip.  Patient agreeable to taking Tramadol at this time and RN placed fresh ice pack on L hip.  RN educated patient about need for movement & patient agreeable to get to the chair with PT today.Will continue to monitor.

## 2021-06-08 NOTE — Plan of Care (Signed)
  Problem: Education: Goal: Knowledge of General Education information will improve Description: Including pain rating scale, medication(s)/side effects and non-pharmacologic comfort measures Outcome: Progressing   Problem: Health Behavior/Discharge Planning: Goal: Ability to manage health-related needs will improve Outcome: Progressing   Problem: Clinical Measurements: Goal: Ability to maintain clinical measurements within normal limits will improve Outcome: Progressing Goal: Diagnostic test results will improve Outcome: Progressing Goal: Respiratory complications will improve Outcome: Progressing   Problem: Activity: Goal: Risk for activity intolerance will decrease Outcome: Progressing   Problem: Elimination: Goal: Will not experience complications related to bowel motility Outcome: Progressing   Problem: Pain Managment: Goal: General experience of comfort will improve Outcome: Progressing   Problem: Safety: Goal: Ability to remain free from injury will improve Outcome: Progressing   Problem: Skin Integrity: Goal: Risk for impaired skin integrity will decrease Outcome: Progressing

## 2021-06-08 NOTE — Progress Notes (Addendum)
PROGRESS NOTE    Stephanie Collier  [id number] DOB: June 29, 1935 DOA: 06/03/2021 PCP: Josetta Huddle, MD   Brief Narrative:  Stephanie Collier is a 86 y.o. female with medical history significant of  Thoracic aortic aneurysm, COPD, and iliac artery aneurysm. Presented with a fall and is now noted to have a left nondisplaced intertrochanteric fracture.  She is now status post left IM nail on 5/25 and PT evaluation is pending.  She has a cardiac murmur and 2D echocardiogram demonstrates moderate aortic valve regurgitation.  This can be further followed up in the outpatient setting.  Assessment & Plan:  Fall at home with Closed left hip fracture ([place]) -Status post IM nail 5/25 -Ortho recommended up with therapy weightbearing as tolerated on left lower extremity. -Lovenox 40 mg daily for 6 weeks postop for DVT prophylaxis as recommended per orthopedics -Keep Aquacel dressing and remove after 14 days -Follow-up with orthopedics in 2 weeks -Continue pain management -PT OT recommended SNF-TOC consulted  Mild to moderate AV stenosis:  -2D echocardiogram done 5/25 with LVEF 60-65% with grade 1 diastolic dysfunction and moderate AV regurgitation -Can be followed outpatient   Prolonged QT interval -will monitor on tele avoid QT prolonging medications, rehydrate  -Magnesium replenished   Acute blood loss anemia: -H&H dropped from 11.6-8.0. -Status post IV iron infusion x2.  Continue iron supplements daily -Monitor H&H closely.  Transfuse as needed.  -H & H remained stable.   HTN (hypertension) -Not on any medications at home. BP elevated this am-added Hydralazine as needed  Aortic aneurysm, thoracic (HCC) -Stable at baseline.  Hypomagnesemia -Resolved   Hyponatremia -Resolved  Disposition: Patient was screened for CIR-patient is not a candidate for CIR at this time.  PT OT recommended SNF.  TOC consulted.  Patient is  medically stable for the discharge.  Pending SNF bed offer.  DVT  prophylaxis: Lovenox Code Status: Full code Family Communication:  None present at bedside.  Plan of care discussed with patient in length and she verbalized understanding and agreed with it.  I called patient's daughter on the phone on 06/07/21 and we discussed plan of care.  She agreed for the short-term rehab.  Disposition Plan: SNF  Consultants:  Orthopedic surgery  Procedures:  IM nail to left hip on 5/25  Antimicrobials:  Cefazolin  Status is: Inpatient     Subjective: Patient seen and examined.  Sitting comfortably on the bed and eating breakfast.  She denies any complaints today.  No fever.  No melena.  No acute events overnight.  Objective: Vitals:   06/07/21 1626 06/07/21 1936 06/08/21 0412 06/08/21 0811  BP: (!) 129/51 (!) 156/61 (!) 152/56 (!) 150/62  Pulse: 80 78 75 76  Resp: 19 (!) 22 (!) 21 20  Temp: 98.3 F (36.8 C) 98.1 F (36.7 C) 98.2 F (36.8 C) 97.8 F (36.6 C)  TempSrc: Oral Oral Oral Oral  SpO2: 98% 99% 94% 98%  Weight:      Height:        Intake/Output Summary (Last 24 hours) at 06/08/2021 1232 Last data filed at 06/08/2021 [id number] Gross per 24 hour  Intake --  Output 200 ml  Net -200 ml    Filed Weights   06/03/21 2112 06/04/21 1320  Weight: 49.9 kg 49.9 kg    Examination:  General exam: Appears calm and comfortable, on room air, communicating well Respiratory system: Clear to auscultation. Respiratory effort normal. Cardiovascular system: S1 & S2 heard,systolic mumrmur. No JVD, No pedal edema. Gastrointestinal system: Abdomen  is nondistended, soft and nontender. No organomegaly or masses felt. Normal bowel sounds heard. Central nervous system: Alert and oriented. No focal neurological deficits. Extremities: Left hip: No signs of active bleeding or discharge seen.  Dressing dry and intact  psychiatry: Judgement and insight appear normal. Mood & affect appropriate.    Data Reviewed: I have personally reviewed following labs and  imaging studies  CBC: Recent Labs  Lab 06/03/21 2242 06/05/21 0116 06/06/21 0226 06/07/21 0300 06/08/21 0156  WBC 10.2 5.1 5.8 5.2 5.5  NEUTROABS 8.7*  --   --   --  4.0  HGB 11.6* 9.0* 8.2* 8.0* 8.3*  HCT 36.6 28.3* 25.1* 24.7* 26.5*  MCV 95.1 95.6 94.4 94.6 96.7  PLT 145* 103* 104* 122* [id number]    Basic Metabolic Panel: Recent Labs  Lab 06/03/21 2242 06/05/21 0116 06/06/21 0226 06/07/21 0300  NA 135 131* 133* 135  K 3.8 3.7 3.6 3.9  CL 103 105 104 105  CO2 25 22 24 27   GLUCOSE 113* 146* 116* 105*  BUN 15 9 10 13   CREATININE 0.52 0.52 0.45 0.47  CALCIUM 8.3* 7.7* 7.7* 7.8*  MG 1.8 1.6* 2.0 1.8  PHOS 3.2  --   --   --     GFR: Estimated Creatinine Clearance: 37.6 mL/min (by C-G formula based on SCr of 0.47 mg/dL). Liver Function Tests: Recent Labs  Lab 06/03/21 2242 06/04/21 0246  AST 21  --   ALT 12  --   ALKPHOS 67  --   BILITOT 1.2  --   PROT 5.9*  --   ALBUMIN 3.3* 3.2*    No results for input(s): LIPASE, AMYLASE in the last 168 hours. No results for input(s): AMMONIA in the last 168 hours. Coagulation Profile: Recent Labs  Lab 06/04/21 0246  INR 1.2    Cardiac Enzymes: Recent Labs  Lab 06/03/21 2242  CKTOTAL 61    BNP (last 3 results) No results for input(s): PROBNP in the last 8760 hours. HbA1C: No results for input(s): HGBA1C in the last 72 hours. CBG: No results for input(s): GLUCAP in the last 168 hours. Lipid Profile: No results for input(s): CHOL, HDL, LDLCALC, TRIG, CHOLHDL, LDLDIRECT in the last 72 hours. Thyroid Function Tests: No results for input(s): TSH, T4TOTAL, FREET4, T3FREE, THYROIDAB in the last 72 hours.  Anemia Panel: No results for input(s): VITAMINB12, FOLATE, FERRITIN, TIBC, IRON, RETICCTPCT in the last 72 hours.  Sepsis Labs: No results for input(s): PROCALCITON, LATICACIDVEN in the last 168 hours.  Recent Results (from the past 240 hour(s))  Surgical pcr screen     Status: None   Collection Time: 06/04/21   1:30 AM   Specimen: Nasal Mucosa; Nasal Swab  Result Value Ref Range Status   MRSA, PCR NEGATIVE NEGATIVE Final   Staphylococcus aureus NEGATIVE NEGATIVE Final    Comment: (NOTE) The Xpert SA Assay (FDA approved for NASAL specimens in patients 21 years of age and older), is one component of a comprehensive surveillance program. It is not intended to diagnose infection nor to guide or monitor treatment. Performed at [facility], [place] [address] [place], [place] [address]        Radiology Studies: No results found.  Scheduled Meds:  docusate sodium  100 mg Oral BID   enoxaparin (LOVENOX) injection  40 mg Subcutaneous Q24H   feeding supplement  237 mL Oral BID BM   ferrous sulfate  325 mg Oral Q breakfast   polyethylene glycol  17  g Oral Daily   Continuous Infusions:  methocarbamol (ROBAXIN) IV       LOS: 5 days   Time spent: 35 minutes   Nerea Bordenave Loann Quill, MD [facility]  If 7PM-7AM, please contact night-coverage [email or web address] 06/08/2021, 12:32 PM

## 2021-06-08 NOTE — Progress Notes (Signed)
Physical Therapy Treatment Patient Details Name: Stephanie Collier MRN: [id number] DOB: 1935/02/16 Today's Date: 06/08/2021   History of Present Illness Stephanie Collier is a 86 y.o. female with medical history significant of  Thoracic aortic aneurysm, COPD, and iliac artery aneurysm. Presented with a fall and is now noted to have a left nondisplaced intertrochanteric fracture.  She is noted to have a cardiac murmur with 2D echocardiogram pending. Patient is s/p ORIF L hip 06/04/21. Recent patella ORIF on left as well.    PT Comments    Pt requiring minA for bed mobility and modA for transfers. Pt requires encouragement to engage with therapy this session but demonstrates increased level of functional mobility. Pt remains hesitant to weight bear through LLE and therapists were unable to progress to ambulation due to patient requesting to get back to bed. Pt limited by fatigue and nausea throughout this session. Recommendation for SNF upon discharge remains appropriate due to pts required level of assistance and decreased functional mobility.    Recommendations for follow up therapy are one component of a multi-disciplinary discharge planning process, led by the attending physician.  Recommendations may be updated based on patient status, additional functional criteria and insurance authorization.  Follow Up Recommendations  Skilled nursing-short term rehab (<3 hours/day)     Assistance Recommended at Discharge Frequent or constant Supervision/Assistance  Patient can return home with the following A lot of help with walking and/or transfers;A lot of help with bathing/dressing/bathroom;Assistance with cooking/housework;Help with stairs or ramp for entrance;Assist for transportation   Equipment Recommendations  Rolling walker (2 wheels)    Recommendations for Other Services       Precautions / Restrictions Precautions Precautions: Fall Restrictions Weight Bearing Restrictions: Yes LLE Weight Bearing:  Weight bearing as tolerated     Mobility  Bed Mobility Overal bed mobility: Needs Assistance Bed Mobility: Supine to Sit, Sit to Supine     Supine to sit: Min assist Sit to supine: Min assist   General bed mobility comments: Assist for LLE for sit <> supine, increased time, HOB elevated cues for using bed rails    Transfers Overall transfer level: Needs assistance Equipment used: Rolling walker (2 wheels) Transfers: Sit to/from Stand, Bed to chair/wheelchair/BSC Sit to Stand: Mod assist, From elevated surface   Step pivot transfers: Mod assist       General transfer comment: Cues for hand placement and upright posture, cues to shift weight to R when stepping with LLE    Ambulation/Gait                   Stairs             Wheelchair Mobility    Modified Rankin (Stroke Patients Only)       Balance Overall balance assessment: Needs assistance Sitting-balance support: Bilateral upper extremity supported, Feet supported Sitting balance-Leahy Scale: Fair Sitting balance - Comments: R lateral lean when sitting EOB Postural control: Posterior lean, Right lateral lean Standing balance support: Bilateral upper extremity supported, During functional activity, Reliant on assistive device for balance Standing balance-Leahy Scale: Poor Standing balance comment: heavy reliance on RW, flexed posture, gait belt support to maintain balance                            Cognition Arousal/Alertness: Awake/alert Behavior During Therapy: WFL for tasks assessed/performed Overall Cognitive Status: Within Functional Limits for tasks assessed  Exercises General Exercises - Lower Extremity Heel Slides: AAROM, 10 reps    General Comments        Pertinent Vitals/Pain Pain Assessment Pain Assessment: Faces Faces Pain Scale: Hurts little more (pt reports no pain at rest but pain increases with  movement) Pain Location: L hip Pain Descriptors / Indicators: Guarding, Grimacing Pain Intervention(s): Limited activity within patient's tolerance, Monitored during session    Home Living                          Prior Function            PT Goals (current goals can now be found in the care plan section) Acute Rehab PT Goals Patient Stated Goal: to get rest PT Goal Formulation: With patient Time For Goal Achievement: 06/10/21 Potential to Achieve Goals: Fair Progress towards PT goals: Progressing toward goals    Frequency    Min 3X/week      PT Plan Current plan remains appropriate    Co-evaluation              AM-PAC PT "6 Clicks" Mobility   Outcome Measure  Help needed turning from your back to your side while in a flat bed without using bedrails?: A Little Help needed moving from lying on your back to sitting on the side of a flat bed without using bedrails?: A Little Help needed moving to and from a bed to a chair (including a wheelchair)?: A Lot Help needed standing up from a chair using your arms (e.g., wheelchair or bedside chair)?: A Lot Help needed to walk in hospital room?: A Lot Help needed climbing 3-5 steps with a railing? : Total 6 Click Score: 13    End of Session Equipment Utilized During Treatment: Gait belt Activity Tolerance: Treatment limited secondary to agitation;Patient limited by fatigue Patient left: in bed;with call bell/phone within reach;with family/visitor present Nurse Communication: Mobility status PT Visit Diagnosis: Other abnormalities of gait and mobility (R26.89);Difficulty in walking, not elsewhere classified (R26.2);Repeated falls (R29.6);Muscle weakness (generalized) (M62.81);Pain;Unsteadiness on feet (R26.81) Pain - Right/Left: Left Pain - part of body: Hip     Time: 1749-4496 PT Time Calculation (min) (ACUTE ONLY): 39 min  Charges:  $Gait Training: 8-22 mins $Therapeutic Activity: 23-37 mins                      Davina Poke, SPT Acute Rehabilitation Services  Office: [phone]    Davina Poke 06/08/2021, 3:41 PM

## 2021-06-09 DIAGNOSIS — G4700 Insomnia, unspecified: Secondary | ICD-10-CM | POA: Diagnosis not present

## 2021-06-09 DIAGNOSIS — K5900 Constipation, unspecified: Secondary | ICD-10-CM | POA: Diagnosis not present

## 2021-06-09 DIAGNOSIS — E43 Unspecified severe protein-calorie malnutrition: Secondary | ICD-10-CM | POA: Diagnosis not present

## 2021-06-09 DIAGNOSIS — R2689 Other abnormalities of gait and mobility: Secondary | ICD-10-CM | POA: Diagnosis not present

## 2021-06-09 DIAGNOSIS — I351 Nonrheumatic aortic (valve) insufficiency: Secondary | ICD-10-CM | POA: Diagnosis not present

## 2021-06-09 DIAGNOSIS — I71012 Dissection of descending thoracic aorta: Secondary | ICD-10-CM | POA: Diagnosis not present

## 2021-06-09 DIAGNOSIS — D500 Iron deficiency anemia secondary to blood loss (chronic): Secondary | ICD-10-CM | POA: Diagnosis not present

## 2021-06-09 DIAGNOSIS — S72142D Displaced intertrochanteric fracture of left femur, subsequent encounter for closed fracture with routine healing: Secondary | ICD-10-CM | POA: Diagnosis not present

## 2021-06-09 DIAGNOSIS — I70209 Unspecified atherosclerosis of native arteries of extremities, unspecified extremity: Secondary | ICD-10-CM | POA: Diagnosis not present

## 2021-06-09 DIAGNOSIS — S72002D Fracture of unspecified part of neck of left femur, subsequent encounter for closed fracture with routine healing: Secondary | ICD-10-CM | POA: Diagnosis not present

## 2021-06-09 DIAGNOSIS — Z9181 History of falling: Secondary | ICD-10-CM | POA: Diagnosis not present

## 2021-06-09 DIAGNOSIS — M25552 Pain in left hip: Secondary | ICD-10-CM | POA: Diagnosis not present

## 2021-06-09 DIAGNOSIS — D649 Anemia, unspecified: Secondary | ICD-10-CM | POA: Diagnosis not present

## 2021-06-09 DIAGNOSIS — S72142A Displaced intertrochanteric fracture of left femur, initial encounter for closed fracture: Secondary | ICD-10-CM | POA: Diagnosis not present

## 2021-06-09 DIAGNOSIS — M80052A Age-related osteoporosis with current pathological fracture, left femur, initial encounter for fracture: Secondary | ICD-10-CM | POA: Diagnosis not present

## 2021-06-09 DIAGNOSIS — S72002A Fracture of unspecified part of neck of left femur, initial encounter for closed fracture: Secondary | ICD-10-CM | POA: Diagnosis not present

## 2021-06-09 DIAGNOSIS — S72145S Nondisplaced intertrochanteric fracture of left femur, sequela: Secondary | ICD-10-CM | POA: Diagnosis not present

## 2021-06-09 DIAGNOSIS — M6281 Muscle weakness (generalized): Secondary | ICD-10-CM | POA: Diagnosis not present

## 2021-06-09 DIAGNOSIS — R011 Cardiac murmur, unspecified: Secondary | ICD-10-CM | POA: Diagnosis not present

## 2021-06-09 DIAGNOSIS — I10 Essential (primary) hypertension: Secondary | ICD-10-CM | POA: Diagnosis not present

## 2021-06-09 DIAGNOSIS — Y92009 Unspecified place in unspecified non-institutional (private) residence as the place of occurrence of the external cause: Secondary | ICD-10-CM | POA: Diagnosis not present

## 2021-06-09 LAB — CBC
HCT: 26 % — ABNORMAL LOW (ref 36.0–46.0)
Hemoglobin: 8.3 g/dL — ABNORMAL LOW (ref 12.0–15.0)
MCH: 30.7 pg (ref 26.0–34.0)
MCHC: 31.9 g/dL (ref 30.0–36.0)
MCV: 96.3 fL (ref 80.0–100.0)
Platelets: 180 10*3/uL (ref 150–400)
RBC: 2.7 MIL/uL — ABNORMAL LOW (ref 3.87–5.11)
RDW: 15.2 % (ref 11.5–15.5)
WBC: 6.8 10*3/uL (ref 4.0–10.5)
nRBC: 0 % (ref 0.0–0.2)

## 2021-06-09 LAB — BASIC METABOLIC PANEL
Anion gap: 7 (ref 5–15)
BUN: 10 mg/dL (ref 8–23)
CO2: 24 mmol/L (ref 22–32)
Calcium: 8.3 mg/dL — ABNORMAL LOW (ref 8.9–10.3)
Chloride: 100 mmol/L (ref 98–111)
Creatinine, Ser: 0.45 mg/dL (ref 0.44–1.00)
GFR, Estimated: >60 mL/min (ref >60)
Glucose, Bld: 91 mg/dL (ref 70–99)
Potassium: 4 mmol/L (ref 3.5–5.1)
Sodium: 131 mmol/L — ABNORMAL LOW (ref 135–145)

## 2021-06-09 MED ORDER — METHOCARBAMOL 500 MG PO TABS: 500.0000 mg | ORAL_TABLET | Freq: Four times a day (QID) | ORAL | Status: DC | PRN

## 2021-06-09 MED ORDER — DOCUSATE SODIUM 100 MG PO CAPS: 100.0000 mg | ORAL_CAPSULE | Freq: Two times a day (BID) | ORAL | 0 refills | Status: DC

## 2021-06-09 MED ORDER — ENSURE ENLIVE PO LIQD: 237.0000 mL | Freq: Two times a day (BID) | ORAL | 12 refills | Status: DC

## 2021-06-09 MED ORDER — POLYETHYLENE GLYCOL 3350 17 G PO PACK: 17.0000 g | PACK | Freq: Every day | ORAL | 0 refills | Status: DC

## 2021-06-09 MED ORDER — FERROUS SULFATE 325 (65 FE) MG PO TABS: 325.0000 mg | ORAL_TABLET | Freq: Every day | ORAL | 3 refills | Status: DC

## 2021-06-09 MED ORDER — ACETAMINOPHEN 325 MG PO TABS: 325.0000 mg | ORAL_TABLET | Freq: Four times a day (QID) | ORAL | Status: DC | PRN

## 2021-06-09 NOTE — Progress Notes (Signed)
PTAR in the unit, patient discharged to [facility].

## 2021-06-09 NOTE — TOC Progression Note (Signed)
Transition of Care [facility]) - Progression Note    Patient Details  Name: Stephanie Collier MRN: [id number] Date of Birth: Sep 03, 1935  Transition of Care [facility]) CM/SW Contact  [name], LCSW Phone Number: 06/09/2021, 8:28 AM  Clinical Narrative:   Auth approved in [place]: [id number], 3 days: 5/30-6/1.  MD informed.    Expected Discharge Plan: Home w Home Health Services Barriers to Discharge: Continued Medical Work up  Expected Discharge Plan and Services Expected Discharge Plan: Home w Home Health Services In-house Referral: Clinical Social Work   Post Acute Care Choice: Home Health Living arrangements for the past 2 months: Single Family Home                                       Social Determinants of Health (SDOH) Interventions    Readmission Risk Interventions     View : No data to display.

## 2021-06-09 NOTE — TOC Transition Note (Signed)
Transition of Care [facility]) - CM/SW Discharge Note   Patient Details  Name: Stephanie Collier MRN: [id number] Date of Birth: March 07, 1935  Transition of Care [facility]) CM/SW Contact:  Joanne Chars, LCSW Phone Number: 06/09/2021, 10:57 AM   Clinical Narrative:   Pt discharging to [facility], room 606.  RN call report to [phone].     Final next level of care: Skilled Nursing Facility Barriers to Discharge: Barriers Resolved   Patient Goals and CMS Choice Patient states their goals for this hospitalization and ongoing recovery are:: "as good as I was"      Discharge Placement              Patient chooses bed at:  [facility]) Patient to be transferred to facility by: [place] Name of family member notified: daughter [name] in room Patient and family notified of of transfer: 06/09/21  Discharge Plan and Services In-house Referral: Clinical Social Work   Post Acute Care Choice: Home Health                               Social Determinants of Health (SDOH) Interventions     Readmission Risk Interventions     View : No data to display.

## 2021-06-09 NOTE — Progress Notes (Signed)
Physical Therapy Treatment Patient Details Name: Stephanie Collier MRN: [id number] DOB: 03/16/1935 Today's Date: 06/09/2021   History of Present Illness Stephanie Collier is a 86 y.o. female with medical history significant of  Thoracic aortic aneurysm, COPD, and iliac artery aneurysm. Presented with a fall and is now noted to have a left nondisplaced intertrochanteric fracture.  She is noted to have a cardiac murmur with 2D echocardiogram pending. Patient is s/p ORIF L hip 06/04/21. Recent patella ORIF on left as well.    PT Comments    Bed level tx for pt due to her incontinence of bowels, but is motivated to do bed ex.  Pt is in room with her daughter who is trying to make arrangements for needs pt will have after rehab is done.  Follow up with her if stay permits, but currently awaiting transport to her rehab destination.  Follow up for POC with acute PT goals.   Recommendations for follow up therapy are one component of a multi-disciplinary discharge planning process, led by the attending physician.  Recommendations may be updated based on patient status, additional functional criteria and insurance authorization.  Follow Up Recommendations  Skilled nursing-short term rehab (<3 hours/day)     Assistance Recommended at Discharge Frequent or constant Supervision/Assistance  Patient can return home with the following A lot of help with walking and/or transfers;A lot of help with bathing/dressing/bathroom;Assistance with cooking/housework;Help with stairs or ramp for entrance;Assist for transportation   Equipment Recommendations  Rolling walker (2 wheels)    Recommendations for Other Services       Precautions / Restrictions Precautions Precautions: Fall Restrictions Weight Bearing Restrictions: Yes LLE Weight Bearing: Weight bearing as tolerated     Mobility  Bed Mobility               General bed mobility comments: declined OOB    Transfers                   General  transfer comment: declined OOB    Ambulation/Gait                   Stairs             Wheelchair Mobility    Modified Rankin (Stroke Patients Only)       Balance                                            Cognition Arousal/Alertness: Awake/alert Behavior During Therapy: WFL for tasks assessed/performed Overall Cognitive Status: Impaired/Different from baseline Area of Impairment: Memory, Safety/judgement, Awareness, Problem solving                     Memory: Decreased recall of precautions, Decreased short-term memory   Safety/Judgement: Decreased awareness of deficits, Decreased awareness of safety Awareness: Intellectual Problem Solving: Slow processing, Requires verbal cues, Requires tactile cues General Comments: in bed and talking about being fully able to get into her own bed at home        Exercises General Exercises - Lower Extremity Ankle Circles/Pumps: AAROM, 5 reps Quad Sets: AAROM, 10 reps Gluteal Sets: AAROM, 10 reps Heel Slides: AAROM, 10 reps Hip ABduction/ADduction: AAROM, 10 reps    General Comments General comments (skin integrity, edema, etc.): ptis in bed and refused OOB but agreed to ther ex      Pertinent Vitals/Pain Pain  Assessment Pain Assessment: Faces Faces Pain Scale: Hurts a little bit Pain Location: L hip Pain Descriptors / Indicators: Guarding, Grimacing    Home Living                          Prior Function            PT Goals (current goals can now be found in the care plan section) Acute Rehab PT Goals Patient Stated Goal: get home to her own bed    Frequency    Min 3X/week      PT Plan Current plan remains appropriate    Co-evaluation              AM-PAC PT "6 Clicks" Mobility   Outcome Measure  Help needed turning from your back to your side while in a flat bed without using bedrails?: A Little Help needed moving from lying on your back to  sitting on the side of a flat bed without using bedrails?: A Little Help needed moving to and from a bed to a chair (including a wheelchair)?: A Little Help needed standing up from a chair using your arms (e.g., wheelchair or bedside chair)?: A Lot Help needed to walk in hospital room?: A Lot Help needed climbing 3-5 steps with a railing? : A Lot 6 Click Score: 15    End of Session   Activity Tolerance: Patient limited by fatigue;Treatment limited secondary to medical complications (Comment) Patient left: in bed;with call bell/phone within reach;with bed alarm set;with family/visitor present Nurse Communication: Mobility status PT Visit Diagnosis: Other abnormalities of gait and mobility (R26.89);Difficulty in walking, not elsewhere classified (R26.2);Repeated falls (R29.6);Muscle weakness (generalized) (M62.81);Pain;Unsteadiness on feet (R26.81) Pain - Right/Left: Left Pain - part of body: Hip     Time: [id number] PT Time Calculation (min) (ACUTE ONLY): 33 min  Charges:  $Therapeutic Exercise: 23-37 mins   Ramond Dial 06/09/2021, 2:35 PM  Mee Hives, PT PhD Acute Rehab Dept. Number: [place] [facility] [place]

## 2021-06-09 NOTE — Discharge Summary (Signed)
Physician Discharge Summary   Patient: Stephanie Collier MRN: [id number] DOB: 07-12-1935  Admit date:     06/03/2021  Discharge date: 06/09/21  Discharge Physician: Pennie Banter   PCP: Marden Noble, MD   Recommendations at discharge:    Follow-up with orthopedic surgery in 2 weeks Follow up with primary care in 1-2 weeks Repeat CBC, BMP, Mg in 1-2 weeks  Discharge Diagnoses: Principal Problem:   Closed left hip fracture (HCC) Active Problems:   Prolonged QT interval   HTN (hypertension)   Aortic aneurysm, thoracic (HCC)   Cardiac murmur   Fall at home, initial encounter   Normocytic anemia   Protein-calorie malnutrition, severe  Resolved Problems:   * No resolved hospital problems. *  Hospital Course: "Stephanie Collier is a 86 y.o. female with medical history significant of  Thoracic aortic aneurysm, COPD, and iliac artery aneurysm. Presented with a fall and is now noted to have a left nondisplaced intertrochanteric fracture.  She is now status post left IM nail on 5/25 and PT evaluation is pending.  She has a cardiac murmur and 2D echocardiogram demonstrates moderate aortic valve regurgitation.  This can be further followed up in the outpatient setting."  Assessment and Plan: Fall at home with Closed left hip fracture [facility]) -Status post IM nail 5/25 -Per ortho: up with therapy weightbearing as tolerated on left lower extremity. -Lovenox 40 mg daily for 6 weeks postop for DVT prophylaxis as recommended per orthopedics -Keep Aquacel dressing and remove after 14 days -Follow-up with orthopedics in 2 weeks -Continue pain management -PT OT recommended SNF-bed available today, pt medically stable for discharge    Mild to moderate AV stenosis:  -2D echocardiogram done 5/25 with LVEF 60-65% with grade 1 diastolic dysfunction and moderate AV regurgitation -Can be followed outpatient   Prolonged QT interval -avoid QT prolonging medications, rehydrate  -magnesium replenished    Acute  blood loss anemia: -H&H dropped from 11.6-8.0. -Status post IV iron infusion x2.   -Continue iron supplements daily -Repeat Hbg in 1 week -H & H remained stable.   HTN (hypertension) -Not on any medications at home. BP elevated this am-added Hydralazine as needed   Aortic aneurysm, thoracic (HCC) -Stable at baseline.   Hypomagnesemia -Resolved   Hyponatremia -Resolved   Disposition: To SNF for ongoing rehab today        Consultants: Orthopedic surgery Procedures performed: Left displaced intertrochanteric fracture reduction and cephalomedullary nail fixation on 06/05/21  Disposition: Skilled nursing facility Diet recommendation:  Regular diet DISCHARGE MEDICATION: Allergies as of 06/09/2021       Reactions   Fentanyl Shortness Of Breath   Asa [aspirin]    Per patient stomach cramps   Azithromycin    Other reaction(s): diarrhea   Fosamax [alendronate]    Other reaction(s): indigestion   Labetalol Hcl    Other reaction(s): indigestion        Medication List     STOP taking these medications    apixaban 2.5 MG Tabs tablet Commonly known as: Eliquis       TAKE these medications    acetaminophen 325 MG tablet Commonly known as: TYLENOL Take 1-2 tablets (325-650 mg total) by mouth every 6 (six) hours as needed for mild pain (pain score 1-3 or temp > 100.5).   cholecalciferol 25 MCG (1000 UNIT) tablet Commonly known as: VITAMIN D Take 1,000 Units by mouth daily.   docusate sodium 100 MG capsule Commonly known as: COLACE Take 1 capsule (100 mg total) by  mouth 2 (two) times daily.   enoxaparin 40 MG/0.4ML injection Commonly known as: LOVENOX Inject 0.4 mLs (40 mg total) into the skin daily.   feeding supplement Liqd Take 237 mLs by mouth 2 (two) times daily between meals.   ferrous sulfate 325 (65 FE) MG tablet Take 1 tablet (325 mg total) by mouth daily with breakfast. Start taking on: Jun 10, 2021   HYDROcodone-acetaminophen 5-325 MG  tablet Commonly known as: NORCO/VICODIN Take 1-2 tablets by mouth every 6 (six) hours as needed for moderate pain.   methocarbamol 500 MG tablet Commonly known as: ROBAXIN Take 1 tablet (500 mg total) by mouth every 6 (six) hours as needed for muscle spasms.   Multi-Vitamins Tabs Take 1 tablet by mouth every other day.   polyethylene glycol 17 g packet Commonly known as: MIRALAX / GLYCOLAX Take 17 g by mouth daily. Start taking on: Jun 10, 2021               Discharge Care Instructions  (From admission, onward)           Start     Ordered   06/09/21 0000  Leave dressing on - Keep it clean, dry, and intact until clinic visit        06/09/21 0957            Follow-up Information     Ernestina Columbia, MD. Schedule an appointment as soon as possible for a visit in 2 week(s).   Specialty: Orthopedic Surgery Contact information: [address] [place] [address] [phone]                Discharge Exam: Ceasar Mons Weights   06/03/21 2112 06/04/21 1320  Weight: 49.9 kg 49.9 kg   General exam: awake, alert, no acute distress HEENT: atraumatic, clear conjunctiva, anicteric sclera, moist mucus membranes Respiratory system: CTAB, no wheezes, rales or rhonchi, normal respiratory effort. Cardiovascular system: normal S1/S2, RRR, no pedal edema.   Gastrointestinal system: soft, NT, ND, no HSM felt, +bowel sounds. Central nervous system: A&O x. no gross focal neurologic deficits, normal speech Extremities: Aquacell rressing left lateral hip, normal tone Skin: dry, intact, normal temperature Psychiatry: normal mood, congruent affect   Condition at discharge: stable  The results of significant diagnostics from this hospitalization (including imaging, microbiology, ancillary and laboratory) are listed below for reference.   Imaging Studies: DG Knee 2 Views Left  Result Date: 06/03/2021 CLINICAL DATA:  Fall, left knee pain EXAM: LEFT KNEE - 1-2 VIEW  COMPARISON:  None Available. FINDINGS: Osseous structures are diffusely osteopenic. Normal alignment. No acute fracture or dislocation. 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 Patellar ORIF has been performed with 2 partially threaded log tendon screws noted. There is thickening of the quadriceps tendon which may be post inflammatory or posttraumatic in nature. Soft tissues are otherwise unremarkable. No effusion. IMPRESSION: No acute abnormality. Electronically Signed   By: Helyn Numbers M.D.   On: 06/03/2021 22:05   CT Head Wo Contrast  Result Date: 06/04/2021 CLINICAL DATA:  Larey Seat hit head, elderly EXAM: CT HEAD WITHOUT CONTRAST TECHNIQUE: Contiguous axial images were obtained from the base of the skull through the vertex without intravenous contrast. RADIATION DOSE REDUCTION: This exam was performed according to the departmental dose-optimization program which includes automated exposure control, adjustment of the mA and/or kV according to patient size and/or use of iterative reconstruction technique. COMPARISON:  11/13/2020 FINDINGS: Brain: No evidence of acute infarction, hemorrhage, cerebral edema, mass, mass  effect, or midline shift. No hydrocephalus or extra-axial fluid collection. Periventricular white matter changes, likely the sequela of chronic small vessel ischemic disease. Degree of cerebral atrophy is within normal limits for age. Vascular: No hyperdense vessel. Atherosclerotic calcifications in the intracranial carotid and vertebral arteries. Skull: Normal. Negative for fracture or focal lesion. Sinuses/Orbits: No acute finding. Other: The mastoid air cells are well aerated. IMPRESSION: No acute intracranial process. Electronically Signed   By: Wiliam KeAlison  Vasan M.D.   On: 06/04/2021 00:07   DG CHEST PORT 1 VIEW  Result Date: 06/04/2021 CLINICAL DATA:  Preop chest, hip fracture EXAM: PORTABLE CHEST 1 VIEW COMPARISON:  04/27/2017 FINDINGS: Lungs are clear.  No  pleural effusion or pneumothorax. The heart is top-normal in size. Descending thoracic aortic stent graft. IMPRESSION: No evidence of acute cardiopulmonary disease. Electronically Signed   By: Charline BillsSriyesh  Krishnan M.D.   On: 06/04/2021 00:16   ECHOCARDIOGRAM COMPLETE  Result Date: 06/04/2021    ECHOCARDIOGRAM REPORT   Patient Name:   West CarboJANICE Laine Date of Exam: 06/04/2021 Medical Rec #:  [id number]  Height:       60.0 in Accession #:    [id number] Weight:       110.0 lb Date of Birth:  07-16-1935  BSA:          1.448 m Patient Age:    86 years   BP:           155/55 mmHg Patient Gender: F          HR:           67 bpm. Exam Location:  Inpatient Procedure: 2D Echo, Color Doppler, Cardiac Doppler and Strain Analysis Indications:    murmur  History:        Patient has no prior history of Echocardiogram examinations.                 Signs/Symptoms:Murmur; Risk Factors:Hypertension.  Sonographer:    Neomia DearAMARA CROWN RDCS Referring Phys: [id number] ANASTASSIA DOUTOVA IMPRESSIONS  1. LV apical false tendon (normal variant). Left ventricular ejection fraction, by estimation, is 60 to 65%. Left ventricular ejection fraction by PLAX is 60 %. The left ventricle has normal function. The left ventricle has no regional wall motion abnormalities. Left ventricular diastolic parameters are consistent with Grade I diastolic dysfunction (impaired relaxation).  2. Right ventricular systolic function is normal. The right ventricular size is normal.  3. Left atrial size was mildly dilated.  4. The mitral valve is abnormal. Trivial mitral valve regurgitation.  5. The aortic valve is tricuspid. There is moderate calcification of the aortic valve. Aortic valve regurgitation is moderate. Mild to moderate aortic valve stenosis. Aortic regurgitation PHT measures 407 msec. Aortic valve area, by VTI measures 1.42 cm. Aortic valve mean gradient measures 8.0 mmHg. Aortic valve Vmax measures 1.98 m/s. Peak gradient 15.6 mmHg. Judi CongDi is 0.45. Comparison(s): No prior  Echocardiogram. FINDINGS  Left Ventricle: LV apical false tendon (normal variant). Left ventricular ejection fraction, by estimation, is 60 to 65%. Left ventricular ejection fraction by PLAX is 60 %. The left ventricle has normal function. The left ventricle has no regional wall  motion abnormalities. The left ventricular internal cavity size was normal in size. There is no left ventricular hypertrophy. Left ventricular diastolic parameters are consistent with Grade I diastolic dysfunction (impaired relaxation). Normal left ventricular filling pressure. Right Ventricle: The right ventricular size is normal. No increase in right ventricular wall thickness. Right ventricular systolic function is normal. Left Atrium: Left atrial  size was mildly dilated. Right Atrium: Right atrial size was normal in size. Pericardium: There is no evidence of pericardial effusion. Mitral Valve: The mitral valve is abnormal. There is mild thickening of the anterior and posterior mitral valve leaflet(s). Trivial mitral valve regurgitation. MV peak gradient, 5.1 mmHg. The mean mitral valve gradient is 2.0 mmHg. Tricuspid Valve: The tricuspid valve is grossly normal. Tricuspid valve regurgitation is trivial. Aortic Valve: The aortic valve is tricuspid. There is moderate calcification of the aortic valve. Aortic valve regurgitation is moderate. Aortic regurgitation PHT measures 407 msec. Mild to moderate aortic stenosis is present. Aortic valve mean gradient measures 8.0 mmHg. Aortic valve peak gradient measures 15.6 mmHg. Aortic valve area, by VTI measures 1.42 cm. Pulmonic Valve: The pulmonic valve was normal in structure. Pulmonic valve regurgitation is not visualized. Aorta: The aortic root and ascending aorta are structurally normal, with no evidence of dilitation. Venous: The inferior vena cava was not well visualized. IAS/Shunts: No atrial level shunt detected by color flow Doppler.  LEFT VENTRICLE PLAX 2D LV EF:         Left             Diastology                ventricular     LV e' medial:    4.67 cm/s                ejection        LV E/e' medial:  7.6                fraction by     LV e' lateral:   5.08 cm/s                PLAX is 60      LV E/e' lateral: 7.0                %. LVIDd:         5.00 cm LVIDs:         3.40 cm LV PW:         1.30 cm LV IVS:        1.00 cm LVOT diam:     2.00 cm LV SV:         58 LV SV Index:   40 LVOT Area:     3.14 cm  LV Volumes (MOD) LV vol d, MOD    82.7 ml A2C: LV vol d, MOD    83.3 ml A4C: LV vol s, MOD    30.3 ml A2C: LV vol s, MOD    38.2 ml A4C: LV SV MOD A2C:   52.4 ml LV SV MOD A4C:   83.3 ml LV SV MOD BP:    48.4 ml RIGHT VENTRICLE RV Basal diam:  3.50 cm RV Mid diam:    2.00 cm RV S prime:     13.20 cm/s TAPSE (M-mode): 2.2 cm LEFT ATRIUM             Index        RIGHT ATRIUM           Index LA Vol (A2C):   33.1 ml 22.86 ml/m  RA Area:     15.80 cm LA Vol (A4C):   49.9 ml 34.47 ml/m  RA Volume:   38.50 ml  26.59 ml/m LA Biplane Vol: 43.9 ml 30.32 ml/m  AORTIC VALVE  PULMONIC VALVE AV Area (Vmax):    1.32 cm      PV Vmax:       1.15 m/s AV Area (Vmean):   1.31 cm      PV Vmean:      74.800 cm/s AV Area (VTI):     1.42 cm      PV VTI:        0.239 m AV Vmax:           197.50 cm/s   PV Peak grad:  5.3 mmHg AV Vmean:          129.000 cm/s  PV Mean grad:  3.0 mmHg AV VTI:            0.410 m AV Peak Grad:      15.6 mmHg AV Mean Grad:      8.0 mmHg LVOT Vmax:         82.90 cm/s LVOT Vmean:        53.800 cm/s LVOT VTI:          0.185 m LVOT/AV VTI ratio: 0.45 AI PHT:            407 msec  AORTA Ao Root diam: 3.60 cm Ao Asc diam:  3.70 cm MITRAL VALVE               TRICUSPID VALVE MV Area (PHT): 2.73 cm    TR Peak grad:   33.6 mmHg MV Area VTI:   2.24 cm    TR Vmax:        290.00 cm/s MV Peak grad:  5.1 mmHg MV Mean grad:  2.0 mmHg    SHUNTS MV Vmax:       1.13 m/s    Systemic VTI:  0.18 m MV Vmean:      55.9 cm/s   Systemic Diam: 2.00 cm MV Decel Time: 278 msec MV E velocity:  35.40 cm/s MV A velocity: 58.20 cm/s MV E/A ratio:  0.61 Zoila Shutter MD Electronically signed by Zoila Shutter MD Signature Date/Time: 06/04/2021/11:14:53 AM    Final    DG Hip Unilat W or Wo Pelvis 2-3 Views Left  Result Date: 06/03/2021 CLINICAL DATA:  Fall, left hip pain EXAM: DG HIP (WITH OR WITHOUT PELVIS) 2-3V LEFT COMPARISON:  None Available. FINDINGS: Osseous structures are diffusely osteopenic. There is an acute, mildly comminuted intratrochanteric fracture of the left hip with override and external rotation of the distal fracture fragment. Femoral head appears seated within the left acetabulum. Left hip joint space is preserved. Limited evaluation of the right hip is unremarkable. Vascular calcifications are noted. IMPRESSION: Acute mildly comminuted left intratrochanteric hip fracture. Osteopenia. Electronically Signed   By: Helyn Numbers M.D.   On: 06/03/2021 22:06   DG FEMUR MIN 2 VIEWS LEFT  Result Date: 06/04/2021 CLINICAL DATA:  Postoperative check. EXAM: LEFT FEMUR 2 VIEWS COMPARISON:  Left hip x-ray 06/03/2021. FINDINGS: There is a new left femoral intramedullary nail and hip screw fixating intratrochanteric fracture. Alignment is anatomic. No dislocation. There is lateral soft tissue swelling and air compatible with recent surgery. There are 2 orthopedic screws visualized in the patella. IMPRESSION: 1. ORIF left femoral intratrochanteric fracture. Alignment is anatomic. 2. Recent postoperative changes. Electronically Signed   By: Darliss Cheney M.D.   On: 06/04/2021 19:19   DG FEMUR MIN 2 VIEWS LEFT  Result Date: 06/04/2021 CLINICAL DATA:  Left femur nail fixation EXAM: LEFT FEMUR 2 VIEWS COMPARISON:  None Available. FLUOROSCOPY: Air kerma 27.2  mGy FINDINGS: Intraoperative fluoroscopic images of the left femur demonstrate intramedullary nail fixation of intratrochanteric fractures. IMPRESSION: Intraoperative fluoroscopic images of the left femur demonstrate intramedullary nail  fixation of intratrochanteric fractures. Electronically Signed   By: Jearld Lesch M.D.   On: 06/04/2021 15:15   DG Knee AP/LAT W/Sunrise Left  Result Date: 06/05/2021 CLINICAL DATA:  Fall, pain EXAM: LEFT KNEE 3 VIEWS COMPARISON:  06/03/2021 FINDINGS: Status post screw reduction of patellar fracture. No evidence of acute fracture, dislocation, or joint effusion. No evidence of arthropathy or other focal bone abnormality. Soft tissues are unremarkable. IMPRESSION: Status post screw reduction of patellar fracture. No evidence of acute fracture or dislocation of the left knee. Electronically Signed   By: Jearld Lesch M.D.   On: 06/05/2021 10:43    Microbiology: Results for orders placed or performed during the hospital encounter of 06/03/21  Surgical pcr screen     Status: None   Collection Time: 06/04/21  1:30 AM   Specimen: Nasal Mucosa; Nasal Swab  Result Value Ref Range Status   MRSA, PCR NEGATIVE NEGATIVE Final   Staphylococcus aureus NEGATIVE NEGATIVE Final    Comment: (NOTE) The Xpert SA Assay (FDA approved for NASAL specimens in patients 28 years of age and older), is one component of a comprehensive surveillance program. It is not intended to diagnose infection nor to guide or monitor treatment. Performed at [facility], [address] [place], [place] [address]     Labs: CBC: Recent Labs  Lab 06/03/21 2242 06/05/21 0116 06/06/21 0226 06/07/21 0300 06/08/21 0156 06/09/21 0313  WBC 10.2 5.1 5.8 5.2 5.5 6.8  NEUTROABS 8.7*  --   --   --  4.0  --   HGB 11.6* 9.0* 8.2* 8.0* 8.3* 8.3*  HCT 36.6 28.3* 25.1* 24.7* 26.5* 26.0*  MCV 95.1 95.6 94.4 94.6 96.7 96.3  PLT 145* 103* 104* 122* 168 180   Basic Metabolic Panel: Recent Labs  Lab 06/03/21 2242 06/05/21 0116 06/06/21 0226 06/07/21 0300 06/09/21 0313  NA 135 131* 133* 135 131*  K 3.8 3.7 3.6 3.9 4.0  CL 103 105 104 105 100  CO2 GLUCOSE 113* 146* 116* 105* 91  BUN CREATININE 0.52 0.52 0.45 0.47 0.45  CALCIUM 8.3* 7.7* 7.7* 7.8* 8.3*  MG 1.8 1.6* 2.0 1.8  --   PHOS 3.2  --   --   --   --    Liver Function Tests: Recent Labs  Lab 06/03/21 2242 06/04/21 0246  AST 21  --   ALT 12  --   ALKPHOS 67  --   BILITOT 1.2  --   PROT 5.9*  --   ALBUMIN 3.3* 3.2*   CBG: No results for input(s): GLUCAP in the last 168 hours.  Discharge time spent: less than 30 minutes.  Signed: Pennie Banter, DO [facility] 06/09/2021

## 2021-06-09 NOTE — Progress Notes (Signed)
Report called to [facility], report given to [employer].  Patient is waiting for PTAR.

## 2021-06-12 DIAGNOSIS — M25552 Pain in left hip: Secondary | ICD-10-CM | POA: Diagnosis not present

## 2021-06-12 DIAGNOSIS — G4700 Insomnia, unspecified: Secondary | ICD-10-CM | POA: Diagnosis not present

## 2021-06-16 DIAGNOSIS — I10 Essential (primary) hypertension: Secondary | ICD-10-CM | POA: Diagnosis not present

## 2021-06-16 DIAGNOSIS — D500 Iron deficiency anemia secondary to blood loss (chronic): Secondary | ICD-10-CM | POA: Diagnosis not present

## 2021-06-16 DIAGNOSIS — S72002D Fracture of unspecified part of neck of left femur, subsequent encounter for closed fracture with routine healing: Secondary | ICD-10-CM | POA: Diagnosis not present

## 2021-06-16 DIAGNOSIS — I351 Nonrheumatic aortic (valve) insufficiency: Secondary | ICD-10-CM | POA: Diagnosis not present

## 2021-06-17 DIAGNOSIS — R2689 Other abnormalities of gait and mobility: Secondary | ICD-10-CM | POA: Diagnosis not present

## 2021-06-17 DIAGNOSIS — Z9181 History of falling: Secondary | ICD-10-CM | POA: Diagnosis not present

## 2021-06-17 DIAGNOSIS — M6281 Muscle weakness (generalized): Secondary | ICD-10-CM | POA: Diagnosis not present

## 2021-06-18 ENCOUNTER — Other Ambulatory Visit: Payer: Self-pay | Admitting: Internal Medicine

## 2021-06-18 DIAGNOSIS — R5381 Other malaise: Secondary | ICD-10-CM

## 2021-06-18 DIAGNOSIS — S72142A Displaced intertrochanteric fracture of left femur, initial encounter for closed fracture: Secondary | ICD-10-CM | POA: Diagnosis not present

## 2021-06-18 DIAGNOSIS — M80052A Age-related osteoporosis with current pathological fracture, left femur, initial encounter for fracture: Secondary | ICD-10-CM | POA: Diagnosis not present

## 2021-06-30 ENCOUNTER — Other Ambulatory Visit: Payer: Self-pay | Admitting: Internal Medicine

## 2021-06-30 DIAGNOSIS — M816 Localized osteoporosis [Lequesne]: Secondary | ICD-10-CM

## 2021-07-01 DIAGNOSIS — I723 Aneurysm of iliac artery: Secondary | ICD-10-CM | POA: Diagnosis not present

## 2021-07-01 DIAGNOSIS — E43 Unspecified severe protein-calorie malnutrition: Secondary | ICD-10-CM | POA: Diagnosis not present

## 2021-07-01 DIAGNOSIS — I10 Essential (primary) hypertension: Secondary | ICD-10-CM | POA: Diagnosis not present

## 2021-07-01 DIAGNOSIS — J449 Chronic obstructive pulmonary disease, unspecified: Secondary | ICD-10-CM | POA: Diagnosis not present

## 2021-07-01 DIAGNOSIS — I350 Nonrheumatic aortic (valve) stenosis: Secondary | ICD-10-CM | POA: Diagnosis not present

## 2021-07-01 DIAGNOSIS — S72145D Nondisplaced intertrochanteric fracture of left femur, subsequent encounter for closed fracture with routine healing: Secondary | ICD-10-CM | POA: Diagnosis not present

## 2021-07-01 DIAGNOSIS — I712 Thoracic aortic aneurysm, without rupture, unspecified: Secondary | ICD-10-CM | POA: Diagnosis not present

## 2021-07-01 DIAGNOSIS — Z9181 History of falling: Secondary | ICD-10-CM | POA: Diagnosis not present

## 2021-07-01 DIAGNOSIS — G4700 Insomnia, unspecified: Secondary | ICD-10-CM | POA: Diagnosis not present

## 2021-07-01 DIAGNOSIS — D62 Acute posthemorrhagic anemia: Secondary | ICD-10-CM | POA: Diagnosis not present

## 2021-07-30 DIAGNOSIS — S72145D Nondisplaced intertrochanteric fracture of left femur, subsequent encounter for closed fracture with routine healing: Secondary | ICD-10-CM | POA: Diagnosis not present

## 2021-08-07 DIAGNOSIS — I723 Aneurysm of iliac artery: Secondary | ICD-10-CM | POA: Diagnosis not present

## 2021-08-07 DIAGNOSIS — I719 Aortic aneurysm of unspecified site, without rupture: Secondary | ICD-10-CM | POA: Diagnosis not present

## 2021-08-07 DIAGNOSIS — E559 Vitamin D deficiency, unspecified: Secondary | ICD-10-CM | POA: Diagnosis not present

## 2021-08-07 DIAGNOSIS — D649 Anemia, unspecified: Secondary | ICD-10-CM | POA: Diagnosis not present

## 2021-08-07 DIAGNOSIS — I700 Atherosclerosis of aorta: Secondary | ICD-10-CM | POA: Diagnosis not present

## 2021-08-07 DIAGNOSIS — Z1331 Encounter for screening for depression: Secondary | ICD-10-CM | POA: Diagnosis not present

## 2021-08-07 DIAGNOSIS — R634 Abnormal weight loss: Secondary | ICD-10-CM | POA: Diagnosis not present

## 2021-08-07 DIAGNOSIS — Z0000 Encounter for general adult medical examination without abnormal findings: Secondary | ICD-10-CM | POA: Diagnosis not present

## 2021-09-23 DIAGNOSIS — S72145D Nondisplaced intertrochanteric fracture of left femur, subsequent encounter for closed fracture with routine healing: Secondary | ICD-10-CM | POA: Diagnosis not present

## 2021-09-23 DIAGNOSIS — M25562 Pain in left knee: Secondary | ICD-10-CM | POA: Diagnosis not present

## 2021-10-08 DIAGNOSIS — R350 Frequency of micturition: Secondary | ICD-10-CM | POA: Diagnosis not present

## 2021-10-08 DIAGNOSIS — R1904 Left lower quadrant abdominal swelling, mass and lump: Secondary | ICD-10-CM | POA: Diagnosis not present

## 2021-10-08 DIAGNOSIS — R634 Abnormal weight loss: Secondary | ICD-10-CM | POA: Diagnosis not present

## 2021-10-09 DIAGNOSIS — R1904 Left lower quadrant abdominal swelling, mass and lump: Secondary | ICD-10-CM | POA: Diagnosis not present

## 2021-10-30 DIAGNOSIS — Z23 Encounter for immunization: Secondary | ICD-10-CM | POA: Diagnosis not present

## 2021-11-09 DIAGNOSIS — H5310 Unspecified subjective visual disturbances: Secondary | ICD-10-CM | POA: Diagnosis not present

## 2021-11-15 DIAGNOSIS — Z23 Encounter for immunization: Secondary | ICD-10-CM | POA: Diagnosis not present

## 2021-12-18 DIAGNOSIS — M546 Pain in thoracic spine: Secondary | ICD-10-CM | POA: Diagnosis not present

## 2021-12-22 ENCOUNTER — Telehealth: Payer: Self-pay

## 2021-12-22 NOTE — Telephone Encounter (Signed)
Pt called requesting the date of her last MRI to check the stents in her aorta ordered by Dr. Myra Gianotti.  Reviewed pt's chart, returned call for clarification, two identifiers used. Informed pt of last MRI was done on 4/22. Confirmed understanding.

## 2021-12-23 ENCOUNTER — Other Ambulatory Visit: Payer: Self-pay | Admitting: Orthopedic Surgery

## 2021-12-23 DIAGNOSIS — M546 Pain in thoracic spine: Secondary | ICD-10-CM

## 2022-01-01 DIAGNOSIS — M25552 Pain in left hip: Secondary | ICD-10-CM | POA: Diagnosis not present

## 2022-01-13 ENCOUNTER — Other Ambulatory Visit: Payer: Medicare Other

## 2022-01-14 ENCOUNTER — Ambulatory Visit
Admission: RE | Admit: 2022-01-14 | Discharge: 2022-01-14 | Disposition: A | Discharge status: Home / Self Care | Payer: Medicare Other | Source: Ambulatory Visit | Attending: Orthopedic Surgery | Admitting: Orthopedic Surgery

## 2022-01-14 DIAGNOSIS — M40204 Unspecified kyphosis, thoracic region: Secondary | ICD-10-CM | POA: Diagnosis not present

## 2022-01-14 DIAGNOSIS — M549 Dorsalgia, unspecified: Secondary | ICD-10-CM | POA: Diagnosis not present

## 2022-01-14 DIAGNOSIS — M546 Pain in thoracic spine: Secondary | ICD-10-CM

## 2022-01-14 DIAGNOSIS — M47814 Spondylosis without myelopathy or radiculopathy, thoracic region: Secondary | ICD-10-CM | POA: Diagnosis not present

## 2022-01-15 DIAGNOSIS — I10 Essential (primary) hypertension: Secondary | ICD-10-CM | POA: Diagnosis not present

## 2022-01-15 DIAGNOSIS — I700 Atherosclerosis of aorta: Secondary | ICD-10-CM | POA: Diagnosis not present

## 2022-01-15 DIAGNOSIS — M546 Pain in thoracic spine: Secondary | ICD-10-CM | POA: Diagnosis not present

## 2022-01-15 DIAGNOSIS — M419 Scoliosis, unspecified: Secondary | ICD-10-CM | POA: Diagnosis not present

## 2022-01-16 ENCOUNTER — Other Ambulatory Visit: Payer: Medicare Other

## 2022-01-18 DIAGNOSIS — R351 Nocturia: Secondary | ICD-10-CM | POA: Diagnosis not present

## 2022-01-22 DIAGNOSIS — M546 Pain in thoracic spine: Secondary | ICD-10-CM | POA: Diagnosis not present

## 2022-01-23 ENCOUNTER — Other Ambulatory Visit: Payer: Medicare Other

## 2022-02-06 ENCOUNTER — Encounter (HOSPITAL_COMMUNITY): Payer: Self-pay

## 2022-02-06 ENCOUNTER — Inpatient Hospital Stay (HOSPITAL_COMMUNITY)
Admission: EM | Admit: 2022-02-06 | Discharge: 2022-02-12 | DRG: 477 | Disposition: A | Discharge status: Skilled Nursing Facility | Payer: Medicare Other | Attending: Internal Medicine | Admitting: Internal Medicine

## 2022-02-06 DIAGNOSIS — K5900 Constipation, unspecified: Secondary | ICD-10-CM | POA: Diagnosis not present

## 2022-02-06 DIAGNOSIS — Z886 Allergy status to analgesic agent status: Secondary | ICD-10-CM

## 2022-02-06 DIAGNOSIS — Z888 Allergy status to other drugs, medicaments and biological substances status: Secondary | ICD-10-CM | POA: Diagnosis not present

## 2022-02-06 DIAGNOSIS — M810 Age-related osteoporosis without current pathological fracture: Secondary | ICD-10-CM | POA: Diagnosis present

## 2022-02-06 DIAGNOSIS — S22000S Wedge compression fracture of unspecified thoracic vertebra, sequela: Secondary | ICD-10-CM | POA: Diagnosis not present

## 2022-02-06 DIAGNOSIS — Z8679 Personal history of other diseases of the circulatory system: Secondary | ICD-10-CM | POA: Diagnosis not present

## 2022-02-06 DIAGNOSIS — R54 Age-related physical debility: Secondary | ICD-10-CM | POA: Diagnosis not present

## 2022-02-06 DIAGNOSIS — S32010A Wedge compression fracture of first lumbar vertebra, initial encounter for closed fracture: Secondary | ICD-10-CM | POA: Diagnosis not present

## 2022-02-06 DIAGNOSIS — M40204 Unspecified kyphosis, thoracic region: Secondary | ICD-10-CM | POA: Diagnosis not present

## 2022-02-06 DIAGNOSIS — R410 Disorientation, unspecified: Secondary | ICD-10-CM | POA: Diagnosis not present

## 2022-02-06 DIAGNOSIS — I70209 Unspecified atherosclerosis of native arteries of extremities, unspecified extremity: Secondary | ICD-10-CM | POA: Diagnosis not present

## 2022-02-06 DIAGNOSIS — M4854XA Collapsed vertebra, not elsewhere classified, thoracic region, initial encounter for fracture: Secondary | ICD-10-CM | POA: Diagnosis not present

## 2022-02-06 DIAGNOSIS — I712 Thoracic aortic aneurysm, without rupture, unspecified: Secondary | ICD-10-CM | POA: Diagnosis present

## 2022-02-06 DIAGNOSIS — Z1152 Encounter for screening for COVID-19: Secondary | ICD-10-CM

## 2022-02-06 DIAGNOSIS — Z66 Do not resuscitate: Secondary | ICD-10-CM | POA: Diagnosis present

## 2022-02-06 DIAGNOSIS — I739 Peripheral vascular disease, unspecified: Secondary | ICD-10-CM | POA: Diagnosis present

## 2022-02-06 DIAGNOSIS — I7143 Infrarenal abdominal aortic aneurysm, without rupture: Secondary | ICD-10-CM | POA: Diagnosis not present

## 2022-02-06 DIAGNOSIS — M549 Dorsalgia, unspecified: Secondary | ICD-10-CM | POA: Diagnosis not present

## 2022-02-06 DIAGNOSIS — M546 Pain in thoracic spine: Secondary | ICD-10-CM | POA: Diagnosis not present

## 2022-02-06 DIAGNOSIS — Z803 Family history of malignant neoplasm of breast: Secondary | ICD-10-CM

## 2022-02-06 DIAGNOSIS — Z681 Body mass index (BMI) 19 or less, adult: Secondary | ICD-10-CM

## 2022-02-06 DIAGNOSIS — M4856XA Collapsed vertebra, not elsewhere classified, lumbar region, initial encounter for fracture: Secondary | ICD-10-CM | POA: Diagnosis present

## 2022-02-06 DIAGNOSIS — Z79899 Other long term (current) drug therapy: Secondary | ICD-10-CM | POA: Diagnosis not present

## 2022-02-06 DIAGNOSIS — I7300 Raynaud's syndrome without gangrene: Secondary | ICD-10-CM | POA: Diagnosis present

## 2022-02-06 DIAGNOSIS — R918 Other nonspecific abnormal finding of lung field: Secondary | ICD-10-CM | POA: Diagnosis not present

## 2022-02-06 DIAGNOSIS — I469 Cardiac arrest, cause unspecified: Secondary | ICD-10-CM | POA: Diagnosis not present

## 2022-02-06 DIAGNOSIS — R64 Cachexia: Secondary | ICD-10-CM | POA: Diagnosis not present

## 2022-02-06 DIAGNOSIS — M6281 Muscle weakness (generalized): Secondary | ICD-10-CM | POA: Diagnosis not present

## 2022-02-06 DIAGNOSIS — R1310 Dysphagia, unspecified: Secondary | ICD-10-CM

## 2022-02-06 DIAGNOSIS — S72145S Nondisplaced intertrochanteric fracture of left femur, sequela: Secondary | ICD-10-CM | POA: Diagnosis not present

## 2022-02-06 DIAGNOSIS — J90 Pleural effusion, not elsewhere classified: Secondary | ICD-10-CM | POA: Diagnosis not present

## 2022-02-06 DIAGNOSIS — R011 Cardiac murmur, unspecified: Secondary | ICD-10-CM | POA: Diagnosis not present

## 2022-02-06 DIAGNOSIS — I723 Aneurysm of iliac artery: Secondary | ICD-10-CM | POA: Diagnosis not present

## 2022-02-06 DIAGNOSIS — S32000A Wedge compression fracture of unspecified lumbar vertebra, initial encounter for closed fracture: Secondary | ICD-10-CM | POA: Diagnosis present

## 2022-02-06 DIAGNOSIS — M898X8 Other specified disorders of bone, other site: Secondary | ICD-10-CM | POA: Diagnosis not present

## 2022-02-06 DIAGNOSIS — J449 Chronic obstructive pulmonary disease, unspecified: Secondary | ICD-10-CM | POA: Diagnosis present

## 2022-02-06 DIAGNOSIS — E43 Unspecified severe protein-calorie malnutrition: Secondary | ICD-10-CM | POA: Diagnosis present

## 2022-02-06 DIAGNOSIS — I71012 Dissection of descending thoracic aorta: Secondary | ICD-10-CM | POA: Diagnosis not present

## 2022-02-06 DIAGNOSIS — M5136 Other intervertebral disc degeneration, lumbar region: Secondary | ICD-10-CM | POA: Diagnosis not present

## 2022-02-06 DIAGNOSIS — M419 Scoliosis, unspecified: Secondary | ICD-10-CM | POA: Diagnosis not present

## 2022-02-06 DIAGNOSIS — S72002A Fracture of unspecified part of neck of left femur, initial encounter for closed fracture: Secondary | ICD-10-CM | POA: Diagnosis not present

## 2022-02-06 DIAGNOSIS — I10 Essential (primary) hypertension: Secondary | ICD-10-CM | POA: Diagnosis present

## 2022-02-06 DIAGNOSIS — M48061 Spinal stenosis, lumbar region without neurogenic claudication: Secondary | ICD-10-CM | POA: Diagnosis not present

## 2022-02-06 DIAGNOSIS — D649 Anemia, unspecified: Secondary | ICD-10-CM | POA: Diagnosis not present

## 2022-02-06 DIAGNOSIS — Z7401 Bed confinement status: Secondary | ICD-10-CM | POA: Diagnosis not present

## 2022-02-06 DIAGNOSIS — S32040A Wedge compression fracture of fourth lumbar vertebra, initial encounter for closed fracture: Secondary | ICD-10-CM | POA: Diagnosis not present

## 2022-02-06 DIAGNOSIS — R2689 Other abnormalities of gait and mobility: Secondary | ICD-10-CM | POA: Diagnosis not present

## 2022-02-06 DIAGNOSIS — S32020D Wedge compression fracture of second lumbar vertebra, subsequent encounter for fracture with routine healing: Secondary | ICD-10-CM | POA: Diagnosis not present

## 2022-02-06 DIAGNOSIS — K5730 Diverticulosis of large intestine without perforation or abscess without bleeding: Secondary | ICD-10-CM | POA: Diagnosis not present

## 2022-02-06 DIAGNOSIS — R52 Pain, unspecified: Secondary | ICD-10-CM | POA: Diagnosis not present

## 2022-02-06 DIAGNOSIS — M8588 Other specified disorders of bone density and structure, other site: Secondary | ICD-10-CM | POA: Diagnosis not present

## 2022-02-06 DIAGNOSIS — S32030A Wedge compression fracture of third lumbar vertebra, initial encounter for closed fracture: Secondary | ICD-10-CM | POA: Diagnosis not present

## 2022-02-06 DIAGNOSIS — M47814 Spondylosis without myelopathy or radiculopathy, thoracic region: Secondary | ICD-10-CM | POA: Diagnosis not present

## 2022-02-06 DIAGNOSIS — M545 Low back pain, unspecified: Secondary | ICD-10-CM | POA: Diagnosis not present

## 2022-02-06 DIAGNOSIS — Y92009 Unspecified place in unspecified non-institutional (private) residence as the place of occurrence of the external cause: Secondary | ICD-10-CM | POA: Diagnosis not present

## 2022-02-06 DIAGNOSIS — R296 Repeated falls: Secondary | ICD-10-CM | POA: Diagnosis not present

## 2022-02-06 DIAGNOSIS — S22000A Wedge compression fracture of unspecified thoracic vertebra, initial encounter for closed fracture: Secondary | ICD-10-CM | POA: Diagnosis present

## 2022-02-06 DIAGNOSIS — R278 Other lack of coordination: Secondary | ICD-10-CM | POA: Diagnosis not present

## 2022-02-06 DIAGNOSIS — S22050A Wedge compression fracture of T5-T6 vertebra, initial encounter for closed fracture: Secondary | ICD-10-CM | POA: Diagnosis not present

## 2022-02-06 DIAGNOSIS — I714 Abdominal aortic aneurysm, without rupture, unspecified: Secondary | ICD-10-CM | POA: Diagnosis not present

## 2022-02-06 LAB — RESP PANEL BY RT-PCR (RSV, FLU A&B, COVID)  RVPGX2
Influenza A by PCR: NEGATIVE
Influenza B by PCR: NEGATIVE
Resp Syncytial Virus by PCR: NEGATIVE
SARS Coronavirus 2 by RT PCR: NEGATIVE

## 2022-02-06 LAB — I-STAT CHEM 8, ED
BUN: 22 mg/dL (ref 8–23)
Calcium, Ion: 1.16 mmol/L (ref 1.15–1.40)
Chloride: 97 mmol/L — ABNORMAL LOW (ref 98–111)
Creatinine, Ser: 0.5 mg/dL (ref 0.44–1.00)
Glucose, Bld: 126 mg/dL — ABNORMAL HIGH (ref 70–99)
HCT: 39 % (ref 36.0–46.0)
Hemoglobin: 13.3 g/dL (ref 12.0–15.0)
Potassium: 3.7 mmol/L (ref 3.5–5.1)
Sodium: 138 mmol/L (ref 135–145)
TCO2: 30 mmol/L (ref 22–32)

## 2022-02-06 LAB — CBC
HCT: 42.2 % (ref 36.0–46.0)
Hemoglobin: 13.7 g/dL (ref 12.0–15.0)
MCH: 31.3 pg (ref 26.0–34.0)
MCHC: 32.5 g/dL (ref 30.0–36.0)
MCV: 96.3 fL (ref 80.0–100.0)
Platelets: 170 10*3/uL (ref 150–400)
RBC: 4.38 MIL/uL (ref 3.87–5.11)
RDW: 13.6 % (ref 11.5–15.5)
WBC: 6.3 10*3/uL (ref 4.0–10.5)
nRBC: 0 % (ref 0.0–0.2)

## 2022-02-06 MED ORDER — ONDANSETRON HCL 4 MG/2ML IJ SOLN
4.0000 mg | Freq: Once | INTRAMUSCULAR | Status: AC
  Administered 2022-02-06: 4 mg via INTRAVENOUS

## 2022-02-06 MED ORDER — ONDANSETRON HCL 4 MG/2ML IJ SOLN
4.0000 mg | Freq: Once | INTRAMUSCULAR | Status: AC
  Administered 2022-02-06: 4 mg via INTRAVENOUS
  Filled 2022-02-06: qty 2

## 2022-02-06 MED ORDER — MORPHINE SULFATE (PF) 4 MG/ML IV SOLN
4.0000 mg | Freq: Once | INTRAVENOUS | Status: AC
  Administered 2022-02-06: 4 mg via INTRAVENOUS
  Filled 2022-02-06: qty 1

## 2022-02-06 MED ORDER — SODIUM CHLORIDE 0.9 % IV BOLUS (SEPSIS)
500.0000 mL | Freq: Once | INTRAVENOUS | Status: AC
  Administered 2022-02-06: 500 mL via INTRAVENOUS

## 2022-02-06 MED ORDER — ONDANSETRON HCL 4 MG/2ML IJ SOLN
INTRAMUSCULAR | Status: AC
  Filled 2022-02-06: qty 2

## 2022-02-06 MED ORDER — SODIUM CHLORIDE 0.9 % IV SOLN
1000.0000 mL | INTRAVENOUS | Status: DC
  Administered 2022-02-06 – 2022-02-07 (×2): 1000 mL via INTRAVENOUS

## 2022-02-06 NOTE — ED Triage Notes (Signed)
Pt has chronic pain d/t Osteoporosis in thoracic and lower back.  Pt states she has "compression fractures"  Since Tuesday she has not been able to use her walker or sit up d/t to increasing pain.  She had an appt with Ortho but could not go d/t pain. - Dr Jonni Sanger.  Dr  Mable Fill referred her to him.

## 2022-02-06 NOTE — ED Provider Notes (Signed)
[place] [facility] AT [facility] Provider Note   CSN: [id number] Arrival date & time: 02/06/22  2106     History  Chief Complaint  Patient presents with   Back Pain    Stephanie Collier is a 87 y.o. female.   Back Pain  Patient has history of multiple medical problems including osteoporosis, aortic aneurysm, scoliosis, hypertension compression fractures, thoracic aortic endovascular stent.  Patient states he has been having some trouble with upper back pain for several months.  She has history of osteoporosis and was diagnosed with compression fractures in her thoracic spine.  Patient has been following up with orthopedics.  She ended up getting an MRI recently.  Patient states she was post to have a follow-up appointment with her orthopedic doctor this past Friday.  However over the previous few days patient was not sleeping well and she started having increasing pain in her back.  Patient feels that the pain is primarily in her lower back now.  Feels better when she is still and at rest but it gets worse when she moves around.  Family states she has been coughing a little bit.  She has not been eating or drinking as well.  She did not go to her doctor's appointment on Friday but she called them and they suggested she come to the ED to be evaluated.    Home Medications Prior to Admission medications   Medication Sig Start Date End Date Taking? Authorizing Provider  acetaminophen (TYLENOL) 325 MG tablet Take 1-2 tablets (325-650 mg total) by mouth every 6 (six) hours as needed for mild pain (pain score 1-3 or temp > 100.5). 06/09/21   Pennie Banter, DO  cholecalciferol (VITAMIN D) 25 MCG (1000 UNIT) tablet Take 1,000 Units by mouth daily.    [provider]  docusate sodium (COLACE) 100 MG capsule Take 1 capsule (100 mg total) by mouth 2 (two) times daily. 06/09/21   Esaw Grandchild A, DO  enoxaparin (LOVENOX) 40 MG/0.4ML injection Inject 0.4 mLs (40 mg total)  into the skin daily. 06/05/21 07/17/21  Ernestina Columbia, MD  feeding supplement (ENSURE ENLIVE / ENSURE PLUS) LIQD Take 237 mLs by mouth 2 (two) times daily between meals. 06/09/21   Esaw Grandchild A, DO  ferrous sulfate 325 (65 FE) MG tablet Take 1 tablet (325 mg total) by mouth daily with breakfast. 06/10/21   Pennie Banter, DO  HYDROcodone-acetaminophen (NORCO/VICODIN) 5-325 MG tablet Take 1-2 tablets by mouth every 6 (six) hours as needed for moderate pain. 06/05/21   Ernestina Columbia, MD  methocarbamol (ROBAXIN) 500 MG tablet Take 1 tablet (500 mg total) by mouth every 6 (six) hours as needed for muscle spasms. 06/09/21   Pennie Banter, DO  Multiple Vitamin (MULTI-VITAMINS) TABS Take 1 tablet by mouth every other day.    [provider]  polyethylene glycol (MIRALAX / GLYCOLAX) 17 g packet Take 17 g by mouth daily. 06/10/21   Pennie Banter, DO      Allergies    Fentanyl, Asa [aspirin], Azithromycin, Fosamax [alendronate], and Labetalol hcl    Review of Systems   Review of Systems  Musculoskeletal:  Positive for back pain.    Physical Exam Updated Vital Signs BP (!) 156/75   Pulse 80   Temp 97.9 F (36.6 C) (Oral)   Resp 18   Ht 1.524 m (5')   Wt 44 kg   SpO2 95%   BMI 18.94 kg/m  Physical Exam Vitals and  nursing note reviewed.  Constitutional:      Appearance: She is well-developed. She is not diaphoretic.     Comments: Elderly, frail  HENT:     Head: Normocephalic and atraumatic.     Right Ear: External ear normal.     Left Ear: External ear normal.  Eyes:     General: No scleral icterus.       Right eye: No discharge.        Left eye: No discharge.     Conjunctiva/sclera: Conjunctivae normal.  Neck:     Trachea: No tracheal deviation.  Cardiovascular:     Rate and Rhythm: Normal rate and regular rhythm.  Pulmonary:     Effort: Pulmonary effort is normal. No respiratory distress.     Breath sounds: Normal breath sounds. No stridor. No wheezing or  rales.  Abdominal:     General: Bowel sounds are normal. There is no distension.     Palpations: Abdomen is soft.     Tenderness: There is no abdominal tenderness. There is no guarding or rebound.     Comments: No abdominal distention, no focal tenderness  Musculoskeletal:        General: No tenderness or deformity.     Cervical back: Neck supple.     Comments: Kyphotic, tenderness palpation thoracic and lumbar spine,  Skin:    General: Skin is warm and dry.     Findings: No rash.  Neurological:     General: No focal deficit present.     Mental Status: She is alert.     Cranial Nerves: No cranial nerve deficit, dysarthria or facial asymmetry.     Sensory: No sensory deficit.     Motor: No abnormal muscle tone or seizure activity.     Coordination: Coordination normal.  Psychiatric:        Mood and Affect: Mood normal.     ED Results / Procedures / Treatments   Labs (all labs ordered are listed, but only abnormal results are displayed) Labs Reviewed  I-STAT CHEM 8, ED - Abnormal; Notable for the following components:      Result Value   Chloride 97 (*)    Glucose, Bld 126 (*)    All other components within normal limits  RESP PANEL BY RT-PCR (RSV, FLU A&B, COVID)  RVPGX2  CBC  COMPREHENSIVE METABOLIC PANEL  LIPASE, BLOOD  URINALYSIS, ROUTINE W REFLEX MICROSCOPIC    EKG None  Radiology No results found.  Procedures Procedures    Medications Ordered in ED Medications  sodium chloride 0.9 % bolus 500 mL (0 mLs Intravenous Stopped 02/06/22 2250)    Followed by  0.9 %  sodium chloride infusion (1,000 mLs Intravenous New Bag/Given 02/06/22 2221)  morphine (PF) 4 MG/ML injection 4 mg (4 mg Intravenous Given 02/06/22 2219)  ondansetron (ZOFRAN) injection 4 mg (4 mg Intravenous Given 02/06/22 2219)  ondansetron (ZOFRAN) injection 4 mg (4 mg Intravenous Given 02/06/22 2332)    ED Course/ Medical Decision Making/ A&P Clinical Course as of 02/06/22 2333  Sat Feb 06, 2022   2331 CBC normal.  Metabolic panel normal.  COVID flu influenza negative. [JK]  2333 No signs of dehydration on laboratory testing [JK]    Clinical Course User Index [JK] Dorie Rank, MD                             Medical Decision Making Differential diagnosis includes but not limited to  musculoskeletal back pain, compression fractures, vascular etiology considering her history of aneurysms  Amount and/or Complexity of Data Reviewed Labs: ordered. Radiology: ordered.  Risk Prescription drug management.   Patient presented with acute pain in her back.  Recently being treated for compression fractures.  Patient does have history of aortic aneurysm as well as vascular stents.  Symptoms sound most likely to be musculoskeletal in nature however with her nausea and an episode of vomiting we will proceed with CT scans of her chest abdomen pelvis to make sure there is no acute vascular complications.  Will also obtain imaging of her thoracic and lumbar spine.  Patient has been given IV fluids and IV pain medications.  Imaging tests pending.   Care turned over to Dr Dolly Rias        Final Clinical Impression(s) / ED Diagnoses Final diagnoses:  None    Rx / DC Orders ED Discharge Orders     None         Dorie Rank, MD 02/06/22 2333

## 2022-02-06 NOTE — ED Provider Notes (Signed)
11:22 PM Assumed care from Dr. Tomi Bamberger, please see their note for full history, physical and decision making until this point. In brief this is a 87 y.o. year old female who presented to the ED tonight with Back Pain     87 year old female with multiple medical problems to include compression fractures, aortic pathology, hypertension amongst others that presents the ER today with multiple symptoms.  7 notably worse lower back pain.  But she has also had some other GI type nausea decreased appetite and also some difficulty sleeping.  She has an appointment for follow-up this coming week however symptoms got worse prompted evaluation here.  Pain meds and nausea meds were provided and has already had some fluids her labs are reassuring pending a CT scan of her aorta along with T and L reformats to ensure no new pathology and likely discharged to follow-up with outpatient care team if everything is reassuring.  Discharge instructions, including strict return precautions for new or worsening symptoms, given. Patient and/or family verbalized understanding and agreement with the plan as described.   Labs, studies and imaging reviewed by myself and considered in medical decision making if ordered. Imaging interpreted by radiology.  Labs Reviewed  I-STAT CHEM 8, ED - Abnormal; Notable for the following components:      Result Value   Chloride 97 (*)    Glucose, Bld 126 (*)    All other components within normal limits  RESP PANEL BY RT-PCR (RSV, FLU A&B, COVID)  RVPGX2  CBC  COMPREHENSIVE METABOLIC PANEL  LIPASE, BLOOD  URINALYSIS, ROUTINE W REFLEX MICROSCOPIC    CT Angio Chest/Abd/Pel for Dissection W and/or Wo Contrast    (Results Pending)  CT L-SPINE NO CHARGE    (Results Pending)  CT T-SPINE NO CHARGE    (Results Pending)    No follow-ups on file.

## 2022-02-07 ENCOUNTER — Observation Stay (HOSPITAL_COMMUNITY): Payer: Medicare Other

## 2022-02-07 ENCOUNTER — Emergency Department (HOSPITAL_COMMUNITY): Payer: Medicare Other

## 2022-02-07 ENCOUNTER — Other Ambulatory Visit (HOSPITAL_COMMUNITY): Payer: Medicare Other

## 2022-02-07 ENCOUNTER — Other Ambulatory Visit: Payer: Self-pay

## 2022-02-07 DIAGNOSIS — I7143 Infrarenal abdominal aortic aneurysm, without rupture: Secondary | ICD-10-CM | POA: Diagnosis present

## 2022-02-07 DIAGNOSIS — M419 Scoliosis, unspecified: Secondary | ICD-10-CM | POA: Diagnosis present

## 2022-02-07 DIAGNOSIS — R1310 Dysphagia, unspecified: Secondary | ICD-10-CM | POA: Diagnosis present

## 2022-02-07 DIAGNOSIS — R54 Age-related physical debility: Secondary | ICD-10-CM | POA: Diagnosis present

## 2022-02-07 DIAGNOSIS — M4854XA Collapsed vertebra, not elsewhere classified, thoracic region, initial encounter for fracture: Secondary | ICD-10-CM | POA: Diagnosis present

## 2022-02-07 DIAGNOSIS — Z79899 Other long term (current) drug therapy: Secondary | ICD-10-CM | POA: Diagnosis not present

## 2022-02-07 DIAGNOSIS — M546 Pain in thoracic spine: Secondary | ICD-10-CM | POA: Diagnosis not present

## 2022-02-07 DIAGNOSIS — J449 Chronic obstructive pulmonary disease, unspecified: Secondary | ICD-10-CM | POA: Diagnosis present

## 2022-02-07 DIAGNOSIS — I10 Essential (primary) hypertension: Secondary | ICD-10-CM | POA: Diagnosis present

## 2022-02-07 DIAGNOSIS — Z888 Allergy status to other drugs, medicaments and biological substances status: Secondary | ICD-10-CM | POA: Diagnosis not present

## 2022-02-07 DIAGNOSIS — Z803 Family history of malignant neoplasm of breast: Secondary | ICD-10-CM | POA: Diagnosis not present

## 2022-02-07 DIAGNOSIS — S22000A Wedge compression fracture of unspecified thoracic vertebra, initial encounter for closed fracture: Secondary | ICD-10-CM | POA: Diagnosis present

## 2022-02-07 DIAGNOSIS — Z1152 Encounter for screening for COVID-19: Secondary | ICD-10-CM | POA: Diagnosis not present

## 2022-02-07 DIAGNOSIS — R64 Cachexia: Secondary | ICD-10-CM | POA: Diagnosis present

## 2022-02-07 DIAGNOSIS — S32000A Wedge compression fracture of unspecified lumbar vertebra, initial encounter for closed fracture: Secondary | ICD-10-CM | POA: Diagnosis present

## 2022-02-07 DIAGNOSIS — M48061 Spinal stenosis, lumbar region without neurogenic claudication: Secondary | ICD-10-CM | POA: Diagnosis not present

## 2022-02-07 DIAGNOSIS — M810 Age-related osteoporosis without current pathological fracture: Secondary | ICD-10-CM | POA: Diagnosis present

## 2022-02-07 DIAGNOSIS — E43 Unspecified severe protein-calorie malnutrition: Secondary | ICD-10-CM | POA: Diagnosis present

## 2022-02-07 DIAGNOSIS — I739 Peripheral vascular disease, unspecified: Secondary | ICD-10-CM | POA: Diagnosis present

## 2022-02-07 DIAGNOSIS — Z681 Body mass index (BMI) 19 or less, adult: Secondary | ICD-10-CM | POA: Diagnosis not present

## 2022-02-07 DIAGNOSIS — M40204 Unspecified kyphosis, thoracic region: Secondary | ICD-10-CM | POA: Diagnosis not present

## 2022-02-07 DIAGNOSIS — Z66 Do not resuscitate: Secondary | ICD-10-CM | POA: Diagnosis present

## 2022-02-07 DIAGNOSIS — M549 Dorsalgia, unspecified: Secondary | ICD-10-CM | POA: Diagnosis present

## 2022-02-07 DIAGNOSIS — I712 Thoracic aortic aneurysm, without rupture, unspecified: Secondary | ICD-10-CM | POA: Diagnosis present

## 2022-02-07 DIAGNOSIS — I7300 Raynaud's syndrome without gangrene: Secondary | ICD-10-CM | POA: Diagnosis present

## 2022-02-07 DIAGNOSIS — R918 Other nonspecific abnormal finding of lung field: Secondary | ICD-10-CM | POA: Diagnosis present

## 2022-02-07 DIAGNOSIS — Z8679 Personal history of other diseases of the circulatory system: Secondary | ICD-10-CM | POA: Diagnosis not present

## 2022-02-07 DIAGNOSIS — M4856XA Collapsed vertebra, not elsewhere classified, lumbar region, initial encounter for fracture: Secondary | ICD-10-CM | POA: Diagnosis present

## 2022-02-07 DIAGNOSIS — M5136 Other intervertebral disc degeneration, lumbar region: Secondary | ICD-10-CM | POA: Diagnosis not present

## 2022-02-07 DIAGNOSIS — Z886 Allergy status to analgesic agent status: Secondary | ICD-10-CM | POA: Diagnosis not present

## 2022-02-07 LAB — COMPREHENSIVE METABOLIC PANEL
ALT: 13 U/L (ref 0–44)
AST: 23 U/L (ref 15–41)
Albumin: 3.2 g/dL — ABNORMAL LOW (ref 3.5–5.0)
Alkaline Phosphatase: 68 U/L (ref 38–126)
Anion gap: 10 (ref 5–15)
BUN: 18 mg/dL (ref 8–23)
CO2: 27 mmol/L (ref 22–32)
Calcium: 8.7 mg/dL — ABNORMAL LOW (ref 8.9–10.3)
Chloride: 99 mmol/L (ref 98–111)
Creatinine, Ser: 0.59 mg/dL (ref 0.44–1.00)
GFR, Estimated: >60 mL/min (ref >60)
Glucose, Bld: 126 mg/dL — ABNORMAL HIGH (ref 70–99)
Potassium: 3.4 mmol/L — ABNORMAL LOW (ref 3.5–5.1)
Sodium: 136 mmol/L (ref 135–145)
Total Bilirubin: 1.5 mg/dL — ABNORMAL HIGH (ref 0.3–1.2)
Total Protein: 5.9 g/dL — ABNORMAL LOW (ref 6.5–8.1)

## 2022-02-07 LAB — URINALYSIS, ROUTINE W REFLEX MICROSCOPIC
Bilirubin Urine: NEGATIVE
Glucose, UA: NEGATIVE mg/dL
Hgb urine dipstick: NEGATIVE
Ketones, ur: 15 mg/dL — AB
Leukocytes,Ua: NEGATIVE
Nitrite: NEGATIVE
Protein, ur: NEGATIVE mg/dL
Specific Gravity, Urine: 1.025 (ref 1.005–1.030)
pH: 5.5 (ref 5.0–8.0)

## 2022-02-07 LAB — LIPASE, BLOOD: Lipase: 33 U/L (ref 11–51)

## 2022-02-07 MED ORDER — CALCITONIN (SALMON) 200 UNIT/ACT NA SOLN
1.0000 | Freq: Every day | NASAL | Status: DC
  Administered 2022-02-07 – 2022-02-12 (×6): 1 via NASAL
  Filled 2022-02-07: qty 3.7

## 2022-02-07 MED ORDER — OXYCODONE HCL 5 MG PO TABS
5.0000 mg | ORAL_TABLET | ORAL | Status: DC | PRN
  Administered 2022-02-08 – 2022-02-11 (×5): 5 mg via ORAL
  Filled 2022-02-07 (×6): qty 1

## 2022-02-07 MED ORDER — OXYCODONE-ACETAMINOPHEN 5-325 MG PO TABS
1.0000 | ORAL_TABLET | Freq: Once | ORAL | Status: DC
  Filled 2022-02-07: qty 1

## 2022-02-07 MED ORDER — POLYETHYLENE GLYCOL 3350 17 G PO PACK: 17.0000 g | PACK | Freq: Every day | ORAL | Status: DC | PRN

## 2022-02-07 MED ORDER — DOCUSATE SODIUM 100 MG PO CAPS
100.0000 mg | ORAL_CAPSULE | Freq: Two times a day (BID) | ORAL | Status: DC
  Administered 2022-02-07 – 2022-02-10 (×6): 100 mg via ORAL
  Filled 2022-02-07 (×11): qty 1

## 2022-02-07 MED ORDER — BISACODYL 5 MG PO TBEC: 5.0000 mg | DELAYED_RELEASE_TABLET | Freq: Every day | ORAL | Status: DC | PRN

## 2022-02-07 MED ORDER — HYDRALAZINE HCL 20 MG/ML IJ SOLN: 5.0000 mg | INTRAMUSCULAR | Status: DC | PRN

## 2022-02-07 MED ORDER — NYSTATIN 100000 UNIT/ML MT SUSP
5.0000 mL | Freq: Four times a day (QID) | OROMUCOSAL | Status: DC
  Administered 2022-02-07 – 2022-02-11 (×9): 500000 [IU] via ORAL
  Filled 2022-02-07 (×16): qty 5

## 2022-02-07 MED ORDER — LACTATED RINGERS IV SOLN: INTRAVENOUS | Status: DC

## 2022-02-07 MED ORDER — PANTOPRAZOLE SODIUM 40 MG IV SOLR: 40.0000 mg | Freq: Two times a day (BID) | INTRAVENOUS | Status: DC

## 2022-02-07 MED ORDER — ONDANSETRON HCL 4 MG PO TABS: 4.0000 mg | ORAL_TABLET | Freq: Four times a day (QID) | ORAL | Status: DC | PRN

## 2022-02-07 MED ORDER — MORPHINE SULFATE (PF) 2 MG/ML IV SOLN
2.0000 mg | INTRAVENOUS | Status: DC | PRN
  Administered 2022-02-08 (×2): 2 mg via INTRAVENOUS
  Filled 2022-02-07 (×3): qty 1

## 2022-02-07 MED ORDER — HYDROMORPHONE HCL 1 MG/ML IJ SOLN
0.5000 mg | Freq: Once | INTRAMUSCULAR | Status: AC
  Administered 2022-02-07: 0.5 mg via INTRAVENOUS
  Filled 2022-02-07: qty 1

## 2022-02-07 MED ORDER — ACETAMINOPHEN 325 MG PO TABS
650.0000 mg | ORAL_TABLET | Freq: Four times a day (QID) | ORAL | Status: DC | PRN
  Administered 2022-02-11: 650 mg via ORAL
  Filled 2022-02-07 (×2): qty 2

## 2022-02-07 MED ORDER — LIDOCAINE 5 % EX PTCH
1.0000 | MEDICATED_PATCH | CUTANEOUS | Status: DC
  Administered 2022-02-07 – 2022-02-12 (×5): 1 via TRANSDERMAL
  Filled 2022-02-07 (×2): qty 1
  Filled 2022-02-07: qty 2
  Filled 2022-02-07 (×3): qty 1

## 2022-02-07 MED ORDER — ONDANSETRON HCL 4 MG/2ML IJ SOLN
4.0000 mg | Freq: Once | INTRAMUSCULAR | Status: DC
  Filled 2022-02-07: qty 2

## 2022-02-07 MED ORDER — ACETAMINOPHEN 650 MG RE SUPP: 650.0000 mg | Freq: Four times a day (QID) | RECTAL | Status: DC | PRN

## 2022-02-07 MED ORDER — ONDANSETRON HCL 4 MG/2ML IJ SOLN
4.0000 mg | Freq: Four times a day (QID) | INTRAMUSCULAR | Status: DC | PRN
  Administered 2022-02-07: 4 mg via INTRAVENOUS
  Filled 2022-02-07: qty 2

## 2022-02-07 MED ORDER — IOHEXOL 350 MG/ML SOLN
75.0000 mL | Freq: Once | INTRAVENOUS | Status: AC | PRN
  Administered 2022-02-07: 75 mL via INTRAVENOUS

## 2022-02-07 NOTE — ED Provider Notes (Signed)
Care assumed from Dr. Dayna Barker at shift change.  The patient with a history of osteoporosis and chronic compression fractures here with worsening back pain.  She also has multiple vascular abnormalities including previous aortic aneurysm and thoracic aortic endovascular stent.  She has diffuse pain as well as difficulty getting around due to pain and difficulty eating and drinking.  She was not able to make it to her doctor on Friday.  Imaging is remarkable for multiple vascular abnormalities including enlarging aortic aneurysm and bilateral iliac artery aneurysms with thrombosis.  Dr. Dayna Barker discussed with Dr. Donzetta Matters of vascular surgery who will arrange for outpatient follow-up.  MRI is ordered to further assess multiple compression fractures throughout thoracic and lumbar spine.  Patient also with difficulty with swallowing.  Per discussion with GI they recommend esophagram and outpatient follow-up if reassuring.  Patient unable to ambulate.  Having persistent pain.  Poor p.o. intake.  Will plan admission to the hospital which patient is agreeable to. D/w Dr. Lorin Mercy.   Ezequiel Essex, MD 02/07/22 0800

## 2022-02-07 NOTE — Discharge Instructions (Addendum)
Vascular surgery will call you with follow up for your multiple vascular aneurysms, otherwise no new recommendations.  You need to follow up with your back doctor regarding fractures in your back.  I have listed the findings from your CT scans here for their reference.  You also need to follow up with [facility] when possible if you don't see them in the hospital regarding your swallowing difficulty. IMPRESSION: 1. No evidence of acute aortic syndrome. 2. Cardiomegaly with prominent superior pulmonary veins and minimal pleural effusions. No appreciable edema. 3. Chronic lung changes with a new 6 mm posterior right apical nodule. Per Fleischner Society Guidelines, recommend a non-contrast Chest CT at 6-12 months. If patient is high risk for malignancy, consider an additional non-contrast Chest CT at 18-24 months. If patient is low risk for malignancy, non-contrast Chest CT at 18-24 months is optional. These guidelines do not apply to immunocompromised patients and patients with cancer. Follow up in patients with significant comorbidities as clinically warranted. For lung cancer screening, adhere to Lung-RADS guidelines. Reference: Radiology. 2017; 284(1):228-43. 4. Increasingly patulous appearance and fluid filling the upper half of the thoracic esophagus. This could be due to reflux or dysmotility. No wall thickening or hiatal hernia. 5. Dilated aortic root at 4.2 cm, previously 4 cm. Recommend annual imaging followup by CTA or MRA. This recommendation follows 2010 ACCF/AHA/AATS/ACR/ASA/SCA/SCAI/SIR/STS/SVM Guidelines for the Diagnosis and Management of Patients with Thoracic Aortic Disease. Circulation. 2010; 121: K539-J673. Aortic aneurysm NOS (ICD10-I71.9). Stable ectasia in the ascending aorta, 3.9 cm. 6. 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 7. Increasing size of infrarenal fusiform AAA now 4.2 x 4.1  cm, previously 3.3 x 3.3 cm with moderate thrombosis along its wall. Vascular surgery referral recommended unless already none. 8. 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 9. 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 10. 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 11. Age-indeterminate but subacute moderate compression fractures of T5-7, all having marrow edema on the recent MRI. 12. Age-indeterminate moderate wedge compression fractures of L3 and L4, with slight retropulsion. Moderate to severe subacute compression fractures of T5, T6, and T7 with slight retropulsion. Unchanged from 01/14/2022 MRI. 3. Mild central compression fracture of the T10 vertebral body, new from 01/14/2022. 4. Age-indeterminate superior endplate compression fractures of L3 and L4, with slight retropulsion at L3 and L4. The gross appearance suggests they are probably nonacute, most likely subacute or chronic.

## 2022-02-07 NOTE — H&P (Signed)
History and Physical    Patient: Stephanie Collier EPP:[id number] DOB: 13-Nov-1935 DOA: 02/06/2022 DOS: the patient was seen and examined on 02/07/2022 PCP: Leeroy Cha  Patient coming from: Home - lives with daughter; NOK: Daughter, Stephanie Collier, [phone]   Chief Complaint: back pain  HPI: Stephanie Collier is a 87 y.o. female with medical history significant of AAA, HTN, thoracic aortic aneurysm, COPD, and Raynaud's presenting with back pain.  She reports that she had a patella fracture in 11/2020 and recovered fairly well until a hip fracture in 05/2021.  She was again recovering ok until she began having back pain.  She has been seeing orthopedics (Dr. Mable Fill) and was referred to Dr. Lynann Bologna for possible kyphoplasty but this has not been approved by insurance yet or scheduled.  Meanwhile, for the last week she has been increasingly less mobile.  For the last few days, she has been unable to get out of bed.  As a result, she is not eating and drinking and is noticing some dysphagia and a "watery" sensation in her throat.      ER Course:  Worsening back pain, difficulty eating/drinking.  MRI with compression fractures earlier in the month.  CTA ordered with worsening vascular issues that need outpatient f/u (per vascular).  New T10 compression fracture?  MRI ordered.  Difficulty with PO, GI recommends esophogram, ?outpatient f/u.      Review of Systems: As mentioned in the history of present illness. All other systems reviewed and are negative. Past Medical History:  Diagnosis Date   AAA (abdominal aortic aneurysm) (HCC)    Arthritis    hip   Hypertension    no meds now   Osteoporosis    Patellar fracture    left   Raynaud's disease    Scoliosis    Past Surgical History:  Procedure Laterality Date   APPENDECTOMY     at age 48   BREAST BIOPSY Left    CHEST TUBE INSERTION Left 04/04/2014   Procedure: CHEST TUBE INSERTION;  Surgeon: Serafina Mitchell, MD;  Location: [place];   Service: Vascular;  Laterality: Left;  by Dr. Vernetta Honey   INSERTION OF VENA CAVA FILTER N/A 04/02/2014   Procedure: INSERTION OF VENA CAVA FILTER;  Surgeon: Serafina Mitchell, MD;  Location: [place] [facility];  Service: Cardiovascular;  Laterality: N/A;   INTRAMEDULLARY (IM) NAIL INTERTROCHANTERIC Left 06/04/2021   Procedure: INTRAMEDULLARY (IM) NAIL INTERTROCHANTRIC;  Surgeon: Georgeanna Harrison, MD;  Location: [place];  Service: Orthopedics;  Laterality: Left;   ORIF PATELLA Left 11/20/2020   Procedure: OPEN REDUCTION INTERNAL (ORIF) FIXATION PATELLA;  Surgeon: Willaim Sheng, MD;  Location: [place];  Service: Orthopedics;  Laterality: Left;   PERIPHERAL VASCULAR CATHETERIZATION N/A 06/26/2014   Procedure: IVC Filter Removal;  Surgeon: Serafina Mitchell, MD;  Location: [place] [facility];  Service: Cardiovascular;  Laterality: N/A;   THORACIC AORTIC ENDOVASCULAR STENT GRAFT N/A 04/04/2014   Procedure: THORACIC AORTIC ENDOVASCULAR STENT GRAFT;  Surgeon: Serafina Mitchell, MD;  Location: [facility];  Service: Vascular;  Laterality: N/A;   Social History:  reports that she has never smoked. She has never used smokeless tobacco. She reports that she does not currently use alcohol. She reports that she does not use drugs.  Allergies  Allergen Reactions   Fentanyl Shortness Of Breath   Asa [Aspirin]     Per patient stomach cramps   Azithromycin     Other reaction(s): diarrhea   Fosamax [Alendronate]  Other reaction(s): indigestion   Labetalol Hcl     Other reaction(s): indigestion    Family History  Problem Relation Age of Onset   Breast cancer Daughter [phone]    Prior to Admission medications   Medication Sig Start Date End Date Taking? Authorizing Provider  acetaminophen (TYLENOL) 325 MG tablet Take 1-2 tablets (325-650 mg total) by mouth every 6 (six) hours as needed for mild pain (pain score 1-3 or temp > 100.5). 06/09/21   Pennie BanterGriffith, Kelly A, DO  cholecalciferol (VITAMIN D) 25 MCG (1000  UNIT) tablet Take 1,000 Units by mouth daily.    [provider]  docusate sodium (COLACE) 100 MG capsule Take 1 capsule (100 mg total) by mouth 2 (two) times daily. 06/09/21   Esaw GrandchildGriffith, Kelly A, DO  enoxaparin (LOVENOX) 40 MG/0.4ML injection Inject 0.4 mLs (40 mg total) into the skin daily. 06/05/21 07/17/21  Ernestina ColumbiaLooney, Austin, MD  feeding supplement (ENSURE ENLIVE / ENSURE PLUS) LIQD Take 237 mLs by mouth 2 (two) times daily between meals. 06/09/21   Esaw GrandchildGriffith, Kelly A, DO  ferrous sulfate 325 (65 FE) MG tablet Take 1 tablet (325 mg total) by mouth daily with breakfast. 06/10/21   Pennie BanterGriffith, Kelly A, DO  HYDROcodone-acetaminophen (NORCO/VICODIN) 5-325 MG tablet Take 1-2 tablets by mouth every 6 (six) hours as needed for moderate pain. 06/05/21   Ernestina ColumbiaLooney, Austin, MD  methocarbamol (ROBAXIN) 500 MG tablet Take 1 tablet (500 mg total) by mouth every 6 (six) hours as needed for muscle spasms. 06/09/21   Pennie BanterGriffith, Kelly A, DO  Multiple Vitamin (MULTI-VITAMINS) TABS Take 1 tablet by mouth every other day.    [provider]  polyethylene glycol (MIRALAX / GLYCOLAX) 17 g packet Take 17 g by mouth daily. 06/10/21   Pennie BanterGriffith, Kelly A, DO    Physical Exam: Vitals:   02/07/22 0629 02/07/22 0629 02/07/22 0715 02/07/22 1045  BP:  (!) 155/68 (!) 155/63 (!) 166/59  Pulse:  74 66 69  Resp:  18 18 16   Temp: 98.4 F (36.9 C)   97.8 F (36.6 C)  TempSrc: Oral   Oral  SpO2:  99% 95% 100%  Weight:      Height:       General:  Appears calm and comfortable and is in NAD while at rest with episodic retching (dry heaves only) Eyes:   EOMI, normal lids, iris ENT:  grossly normal hearing, lips & tongue, mmm; appropriate dentition Neck:  no LAD, masses or thyromegaly Cardiovascular:  RRR, no m/r/g. No LE edema.  Respiratory:   CTA bilaterally with no wheezes/rales/rhonchi.  Normal respiratory effort. Abdomen:  soft, NT, ND Skin:  no rash or induration seen on limited exam Musculoskeletal:  grossly normal  tone BUE/BLE, good ROM, no bony abnormality Psychiatric:  blunted mood and affect, speech fluent and appropriate, AOx3 Neurologic:  CN 2-12 grossly intact, moves all extremities in coordinated fashion   Radiological Exams on Admission: Independently reviewed - see discussion in A/P where applicable  MR LUMBAR SPINE WO CONTRAST  Result Date: 02/07/2022 CLINICAL DATA:  Low back pain.  Compression fractures EXAM: MRI LUMBAR SPINE WITHOUT CONTRAST TECHNIQUE: Multiplanar, multisequence MR imaging of the lumbar spine was performed. No intravenous contrast was administered. COMPARISON:  CT 02/07/2022, 12/29/2017 FINDINGS: Segmentation:  Standard. Alignment: Lumbar dextrocurvature. Grade 1 anterolisthesis of L5 on S1. Slight retrolisthesis at L1-L2. Vertebrae: Superior endplate compression fracture of the L3 vertebral body with approximately 40% vertebral body height loss. Bone marrow edema throughout the vertebral body compatible  with an acute or subacute process. Minimal retropulsion at the superior endplate. No fracture extension into the posterior elements. Superior endplate compression fracture of the L4 vertebral body with approximately 45% vertebral body height loss. Minimal retropulsion at the superior endplate. Faint marrow edema favors a subacute process. The remaining lumbar vertebral body heights are maintained. No additional fractures. Hyperintense T2/STIR signal within the L2-3 and L3-4 disc spaces are favored to be posttraumatic without secondary findings to suggest discitis. No suspicious marrow replacing bone lesion. Conus medullaris and cauda equina: Conus extends to the L1-2 level. Conus and cauda equina appear normal. Paraspinal and other soft tissues: Known infrarenal abdominal aortic aneurysm and bilateral iliac artery aneurysms, as characterized on same day dedicated CT angiogram. Disc levels: T12-L1: Mild disc bulge without foraminal or canal stenosis. L1-L2: Mild disc bulge and bilateral  facet hypertrophy. Mild left foraminal stenosis. No canal stenosis. L2-L3: Mild disc bulge with L3 superior endplate retropulsion. Mild-to-moderate bilateral facet arthropathy. No significant foraminal or canal stenosis. L3-L4: Diffuse disc bulge with mild retropulsion of the L4 superior endplate. Moderate bilateral facet arthropathy. Left greater than right subarticular recess stenosis without canal stenosis. Moderate left and mild right foraminal stenosis. L4-L5: Mild disc bulge with shallow disc protrusion in the right subarticular zone. Moderate facet arthropathy. No foraminal or canal stenosis. L5-S1: Minimal disc bulge. Bilateral facet arthropathy. No foraminal or canal stenosis. IMPRESSION: 1. Acute-to-subacute superior endplate compression fractures of the L3 and L4 vertebral bodies with minimal retropulsion of both superior endplates. 2. Multilevel degenerative changes of the lumbar spine, as described above. No significant canal stenosis at any level. 3. Moderate left and mild right foraminal stenosis at L3-L4. 4. Known infrarenal abdominal aortic aneurysm and bilateral iliac artery aneurysms, as characterized on same day dedicated CT angiogram. Electronically Signed   By: Davina Poke D.O.   On: 02/07/2022 09:29   MR THORACIC SPINE WO CONTRAST  Result Date: 02/07/2022 CLINICAL DATA:  87 year old female with mid back pain. T5 through T7 compression fractures demonstrated on MRI earlier this month. New T10 vertebral body suspected on CT this morning, along with age indeterminate L3 and L4 compression fractures. EXAM: MRI THORACIC SPINE WITHOUT CONTRAST TECHNIQUE: Multiplanar, multisequence MR imaging of the thoracic spine was performed. No intravenous contrast was administered. COMPARISON:  CTA CT Chest, Abdomen, and Pelvis, CT thoracic and lumbar spine 0306 hours today. Recent thoracic MRI 01/14/2022.  Cervical spine CT 11/13/2020. FINDINGS: Limited cervical spine imaging:  Unremarkable for age.  Thoracic spine segmentation: Normal by CT this morning, same numbering system used on 01/14/2022. Alignment: Midthoracic kyphosis does not appear significantly changed from earlier this month. Vertebrae: Severe T5, T6 and T7 compression fractures with near vertebra plana and ongoing marrow edema. Patchy new anterior superior endplate marrow edema at both the T4 inferior and T8 superior endplates on series 4, image 11. Vertebral body height at both levels appears maintained. T10 inferior endplate compression fracture with patchy new marrow edema and 25% loss of height since 01/14/2022. Mild retropulsion of the T10 posteroinferior endplate. T10 posterior elements appear intact. Other thoracic levels appears stable and intact. Lumbar vertebrae are reported separately today. Cord: Stable, normal. Capacious thoracic spinal canal at most levels. Conus medullaris is at T12-L1. Paraspinal and other soft tissues: Stable to the CT Chest, Abdomen, and Pelvis this morning. Disc levels: Unchanged thoracic spine degeneration which is generally mild for age. No spinal stenosis, even when considering the mild compression fracture related retropulsion. IMPRESSION: 1. Acute T10 compression fracture with  25% loss of vertebral body height is new since 01/14/2022. No significant retropulsion or other complicating features. 2. Severe T5 through T7 compression fractures (near vertebra plana) with ongoing marrow edema. New anterior endplate marrow edema at the adjacent T4 and T8 levels is favored to be reactive, rather than due to impending collapse. 3. No thoracic spinal stenosis or spinal cord signal abnormality. 4. Lumbar MRI today reported separately. Electronically Signed   By: Odessa FlemingH  Hall M.D.   On: 02/07/2022 09:21   CT L-SPINE NO CHARGE  Result Date: 02/07/2022 CLINICAL DATA:  Back pain. EXAM: CT Thoracic and Lumbar spine without contrast TECHNIQUE: Multiplanar CT images of the thoracic and lumbar spine were reconstructed from  contemporary CTA of the Chest, Abdomen, and Pelvis. RADIATION DOSE REDUCTION: This exam was performed according to the departmental dose-optimization program which includes automated exposure control, adjustment of the mA and/or kV according to patient size and/or use of iterative reconstruction technique. CONTRAST:  None or No additional COMPARISON:  Thoracic spine MRI recently 01/14/2022. CTA chest, abdomen and pelvis 12/29/2017. FINDINGS: CT THORACIC SPINE FINDINGS Alignment: There is a nearly 60 degrees kyphotic angle centered at C5-7, mild dextroscoliosis. This was seen previously. Vertebrae: Generalized osteopenia. Moderate to severe subacute compression fractures of T5, T6 and T7 are again noted, with slight retropulsion. There are mild but increased concavities of the upper and lower plates of the [id number] vertebral body consistent with a mild central compression fracture new from 01/14/2022. The central height loss no more than 20%, without significant anterior or posterior height loss. All pedicles and posterior elements are intact. No aggressive primary bone lesion is seen. Other thoracic vertebrae are preserved in height. There is thoracic spondylosis. Paraspinal and other soft tissues: No paraspinal hematoma. Other soft tissue findings described separately. Disc levels: Disc space loss T5-8 appears similar with otherwise normal disc heights. Small disc protrusions are again noted and T11-12 and T10-11 and mild disc bulging at T12-L1 and L1-2. There is mild bilateral foraminal stenosis at T7-8 due to the T7 compression fracture. Other foramina appear generally clear despite slight facet spurring at most levels. CT LUMBAR SPINE FINDINGS Segmentation: 5 lumbar type vertebrae. Alignment: Chronic mild-to-moderate dextro rotary scoliosis, apex L2-3. Vertebrae: Osteopenia. There are new age-indeterminate superior endplate compression fractures of L3 and L4, both with slight posterosuperior retropulsion, up to 2 mm  retropulsion at L3 and after 3-4 mm retropulsion at L4. There is no bony fragmentation anteriorly in these may be subacute, but there is sclerosis still significant vertebral bodies consistent with a healing response. Loss of vertebral height at L3 is 40-50% anteriorly and posteriorly, 50-60% centrally. Loss of vertebral height at L4 is 50% anteriorly and centrally 30-40% posteriorly. Other vertebrae are normal in heights. No primary bone lesion is seen. Paraspinal and other soft tissues: Enlarging infrarenal AAA, described and reported separately. No paraspinal hematoma. Disc levels: There are normal lumbar disc heights from T12-L1 down. There are mild disc bulges T12-L1 and L1-2, with posterior disc osteophyte complexes L2-3 and L3-4 causing moderate spinal canal stenosis in combination with hypertrophic facet changes. There are mild nonstenosing disc bulges at L4-5 and L5-S1. Facet hypertrophy is seen at all levels but is greatest from L3-4 down. The foramina however, are not significantly stenotic. Other: All pedicles and posterior elements are intact. There is no displaced sacral insufficiency fracture. IMPRESSION: 1. Osteopenia and degenerative change. 2. Moderate to severe subacute compression fractures of T5, T6, and T7 with slight retropulsion. Unchanged from 01/14/2022 MRI. 3.  Mild central compression fracture of the T10 vertebral body, new from 01/14/2022. 4. Age-indeterminate superior endplate compression fractures of L3 and L4, with slight retropulsion at L3 and L4. The gross appearance suggests they are probably nonacute, most likely subacute or chronic. 5. Enlarging infrarenal AAA, described and reported separately. Electronically Signed   By: Almira Bar M.D.   On: 02/07/2022 05:38   CT T-SPINE NO CHARGE  Result Date: 02/07/2022 CLINICAL DATA:  Back pain. EXAM: CT Thoracic and Lumbar spine without contrast TECHNIQUE: Multiplanar CT images of the thoracic and lumbar spine were reconstructed  from contemporary CTA of the Chest, Abdomen, and Pelvis. RADIATION DOSE REDUCTION: This exam was performed according to the departmental dose-optimization program which includes automated exposure control, adjustment of the mA and/or kV according to patient size and/or use of iterative reconstruction technique. CONTRAST:  None or No additional COMPARISON:  Thoracic spine MRI recently 01/14/2022. CTA chest, abdomen and pelvis 12/29/2017. FINDINGS: CT THORACIC SPINE FINDINGS Alignment: There is a nearly 60 degrees kyphotic angle centered at C5-7, mild dextroscoliosis. This was seen previously. Vertebrae: Generalized osteopenia. Moderate to severe subacute compression fractures of T5, T6 and T7 are again noted, with slight retropulsion. There are mild but increased concavities of the upper and lower plates of the Z66 vertebral body consistent with a mild central compression fracture new from 01/14/2022. The central height loss no more than 20%, without significant anterior or posterior height loss. All pedicles and posterior elements are intact. No aggressive primary bone lesion is seen. Other thoracic vertebrae are preserved in height. There is thoracic spondylosis. Paraspinal and other soft tissues: No paraspinal hematoma. Other soft tissue findings described separately. Disc levels: Disc space loss T5-8 appears similar with otherwise normal disc heights. Small disc protrusions are again noted and T11-12 and T10-11 and mild disc bulging at T12-L1 and L1-2. There is mild bilateral foraminal stenosis at T7-8 due to the T7 compression fracture. Other foramina appear generally clear despite slight facet spurring at most levels. CT LUMBAR SPINE FINDINGS Segmentation: 5 lumbar type vertebrae. Alignment: Chronic mild-to-moderate dextro rotary scoliosis, apex L2-3. Vertebrae: Osteopenia. There are new age-indeterminate superior endplate compression fractures of L3 and L4, both with slight posterosuperior retropulsion, up to  2 mm retropulsion at L3 and after 3-4 mm retropulsion at L4. There is no bony fragmentation anteriorly in these may be subacute, but there is sclerosis still significant vertebral bodies consistent with a healing response. Loss of vertebral height at L3 is 40-50% anteriorly and posteriorly, 50-60% centrally. Loss of vertebral height at L4 is 50% anteriorly and centrally 30-40% posteriorly. Other vertebrae are normal in heights. No primary bone lesion is seen. Paraspinal and other soft tissues: Enlarging infrarenal AAA, described and reported separately. No paraspinal hematoma. Disc levels: There are normal lumbar disc heights from T12-L1 down. There are mild disc bulges T12-L1 and L1-2, with posterior disc osteophyte complexes L2-3 and L3-4 causing moderate spinal canal stenosis in combination with hypertrophic facet changes. There are mild nonstenosing disc bulges at L4-5 and L5-S1. Facet hypertrophy is seen at all levels but is greatest from L3-4 down. The foramina however, are not significantly stenotic. Other: All pedicles and posterior elements are intact. There is no displaced sacral insufficiency fracture. IMPRESSION: 1. Osteopenia and degenerative change. 2. Moderate to severe subacute compression fractures of T5, T6, and T7 with slight retropulsion. Unchanged from 01/14/2022 MRI. 3. Mild central compression fracture of the T10 vertebral body, new from 01/14/2022. 4. Age-indeterminate superior endplate compression fractures of L3  and L4, with slight retropulsion at L3 and L4. The gross appearance suggests they are probably nonacute, most likely subacute or chronic. 5. Enlarging infrarenal AAA, described and reported separately. Electronically Signed   By: Almira BarKeith  Chesser M.D.   On: 02/07/2022 05:38   CT Angio Chest/Abd/Pel for Dissection W and/or Wo Contrast  Result Date: 02/07/2022 CLINICAL DATA:  Acute aortic syndrome suspected. History of thoracic aortic stent graft and AAA. EXAM: CT ANGIOGRAPHY CHEST,  ABDOMEN AND PELVIS TECHNIQUE: Axial chest CT without contrast was initially obtained. Multidetector CT imaging through the chest, abdomen and pelvis was performed using the standard protocol during bolus administration of intravenous contrast. Multiplanar reconstructed images and MIPs were obtained and reviewed to evaluate the vascular anatomy. RADIATION DOSE REDUCTION: This exam was performed according to the departmental dose-optimization program which includes automated exposure control, adjustment of the mA and/or kV according to patient size and/or use of iterative reconstruction technique. CONTRAST:  75mL OMNIPAQUE IOHEXOL 350 MG/ML SOLN COMPARISON:  CTA chest, abdomen and pelvis 12/29/2017. MRI thoracic spine 01/14/2022. Last chest film was portable 06/03/2021. FINDINGS: CTA CHEST FINDINGS Cardiovascular: There is moderate panchamber cardiomegaly with left chamber predominance. Three-vessel coronary artery calcifications greatest in the LAD. There are calcific plaques in the aorta and scattered calcifications in the great vessels. The great vessels show tortuosity without stenosis. There is a small increased pericardial effusion. The heart has increased in size since 2019. The superior pulmonary veins are prominent. The pulmonary trunk caliber, ectatic measuring 3.3 cm indicating arterial hypertension. There is no central embolus. Interval slight dilatation aortic root at the sinuses of Valsalva where the diameter is 4.2 cm previously 4 cm. Stable ectasia in the ascending aorta, measuring 3.9 cm. The arch segment again measures 2.8 cm. A thoracic aortic stent graft again extends from 1.3 cm distal to the left subclavian takeoff down to T10-11. No focal recurrent aneurysm is seen but the descending segment remains ectatic at 3.5 cm. No acute stroke complication is seen, no aortic dissection, with moderate aortic tortuosity. A penetrating ulcer is again noted just beyond the distal graft landing zone along the  left lateral wall where the aortic diameter is 3.2 cm, previously 3 cm. Mediastinum/Nodes: No enlarged mediastinal, hilar, or axillary lymph nodes. Thyroid gland is unremarkable. The trachea and central airways are prominent but clear. There is an increasingly patulous appearance and fluid filling in the upper half of the thoracic esophagus. No wall thickening or hiatal hernia. Lungs/Pleura: Diffuse bronchial thickening especially in the lower lobes. Mild chronic elevation right diaphragm. There is chronic change with subpleural reticulation, stable 4 mm right apical nodule on 8:20, stable 3 mm subpleural nodule laterally in the right upper lobe on 8:55. There is a new pleural-based squarish 6 mm noncalcified nodule in the posterior right apex on 8:20. There is increased medial left apical pleural-parenchymal scar-like opacity without a nodule like appearance. There are bilateral minimal pleural effusions not seen previously. No pneumothorax is seen or acute lung infiltrates or edema. Musculoskeletal: Osteopenia. There is thoracic kyphosis and again noted moderate to severe anterior wedge compression fractures of the T5-7 vertebrae, all having marrow edema on the recent MRI but without interval increasing retropulsion. The ribcage is intact. No chest wall masses. Review of the MIP images confirms the above findings. CTA ABDOMEN AND PELVIS FINDINGS VASCULAR Aorta: The hiatal aortic segment unchanged, measuring 2.8 cm. There is moderate to heavy calcific wall plaque an enlarging infrarenal fusiform AAA now measuring 4.2 x 4.1 cm, was previously  3.3 x 3.3 cm with moderate thrombosis along its wall. No dissection. Celiac: Patent without evidence of aneurysm, dissection, vasculitis or significant stenosis. There are nonstenosing ostial calcifications. The vessel arises from the left lateral aortic wall SMA: Small amount of non stenosing mixed plaque. The vessel is patent without evidence of aneurysm, dissection,  vasculitis or significant stenosis. It arises from the left anterolateral wall. Renals: Both are single. Again noted unchanged there is high-grade calcific origin stenosis on the left and no significant stenosis on the right. IMA: Small caliber but without focal stenosis, arises just above the infrarenal AAA. Inflow: Both common iliac arteries are heavily calcified. On the left there is diffuse increased aneurysmal dilatation and increased wall thrombus, maximum diameter now 2.7 cm, previously 2.3 cm. On the right there is diffuse common iliac aneurysmal dilatation, scattered wall thrombus and maximum diameter now 2.2 cm, previously 2.0 cm. No dissection or focal stenosis. The external iliac arteries are tortuous but are relatively clear. Both internal iliac arteries with patchy calcifications without stenosis with vessel tortuosity. 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 Veins: Unopacified and not evaluated Review of the MIP images confirms the above findings. NON-VASCULAR Hepatobiliary: No apparent masses seen. The gallbladder and bile ducts are unremarkable. Pancreas: No significant abnormality. 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 Spleen: No abnormality. Heterogeneous enhancement consistent with bolus phase. Adrenals/Urinary Tract: Scar-like cortical defect posterior left upper pole, unchanged. No adrenal or renal mass enhancement. No urinary stone or obstruction. No bladder thickening is seen. Stomach/Bowel: No dilatation or wall thickening. An appendix is not seen in this patient. There is colonic diverticulosis without evidence of focal diverticulitis. Lymphatic: No adenopathy is seen. Reproductive: Fibroid uterus.  No adnexal mass. Other: No free air, free hemorrhage, free fluid or  incarcerated hernia. Musculoskeletal: Age-indeterminate moderate compression fractures of the L3 and 4 vertebral bodies with slight retropulsion new from 2019. There is dextro rotary scoliosis apex at L2-3. Prior left hip nailing. Review of the MIP images confirms the above findings. IMPRESSION: 1. No evidence of acute aortic syndrome. 2. Cardiomegaly with prominent superior pulmonary veins and minimal pleural effusions. No appreciable edema. 3. Chronic lung changes with a new 6 mm posterior right apical nodule. Per Fleischner Society Guidelines, recommend a non-contrast Chest CT at 6-12 months. If patient is high risk for malignancy, consider an additional non-contrast Chest CT at 18-24 months. If patient is low risk for malignancy, non-contrast Chest CT at 18-24 months is optional. These guidelines do not apply to immunocompromised patients and patients with cancer. Follow up in patients with significant comorbidities as clinically warranted. For lung cancer screening, adhere to Lung-RADS guidelines. Reference: Radiology. 2017; 284(1):228-43. 4. Increasingly patulous appearance and fluid filling the upper half of the thoracic esophagus. This could be due to reflux or dysmotility. No wall thickening or hiatal hernia. 5. Dilated aortic root at 4.2 cm, previously 4 cm. Recommend annual imaging followup by CTA or MRA. This recommendation follows 2010 ACCF/AHA/AATS/ACR/ASA/SCA/SCAI/SIR/STS/SVM Guidelines for the Diagnosis and Management of Patients with Thoracic Aortic Disease. Circulation. 2010; 121: D638-V564. Aortic aneurysm NOS (ICD10-I71.9). Stable ectasia in the ascending aorta, 3.9 cm. 6. 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 7. Increasing size of infrarenal fusiform AAA  now 4.2 x 4.1 cm, previously 3.3 x 3.3 cm with moderate thrombosis along its wall. Vascular surgery referral recommended unless already none. 8. 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 9. 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 10. 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 11. Age-indeterminate but subacute moderate compression fractures of T5-7, all having marrow edema on the recent MRI. 12. Age-indeterminate moderate wedge compression fractures of L3 and L4, with slight retropulsion. 13. Remaining findings discussed above. Electronically Signed   By: Almira Bar M.D.   On: 02/07/2022 05:09    EKG: not done   Labs on Admission: I have personally reviewed the available labs and imaging studies at the time of the admission.  Pertinent labs:    K+ 3.4 Glucose 126 Albumin 3.2 Bili 1.5 Normal CBC COVID/flu/RSV negative UA: 15 ketones   Assessment and Plan: Principal Problem:   Intractable back pain Active Problems:   HTN (hypertension)   Aortic aneurysm, thoracic (HCC)   Compression fracture of body of thoracic vertebra (HCC)   Lumbar compression fracture, closed, initial encounter (HCC)   PVD (peripheral vascular disease) (HCC)   Dysphagia   Multiple lung nodules   DNR (do not resuscitate)    Intractable back pain related to compression fractures -Patient with known compression fractures presenting with intractable back pain and minimal ambulation as a result -Tuesday night she felt a pop in her mid-back and had severe pain since -The pain seemed to migrate into her lower back over the last couple of days -MRIs today indicate acute-to-subacute L3/4 and T10 compression fractures (T10 with 25% loss of vertebral body height) -Also with severe T5-7 compression fractures with ongoing marrow edema -IR consult in AM for kyphoplasty -Will not make patient NPO at this time since insurance authorization is usually required and this can take several  days -Kyphoplasty is indicated for patients with a >15% height loss with fracture in <3 months as well as for acute, intractable pain associated with fracture -SCDs for DVT prophylaxis -Pain control with morphine and lidocaine patch as well as Calcitonin nasal spray pre-procedure -PT evaluation ordered but this may need to be delayed if she is unable to participate due to pain -Took alendronate for a very brief period of time in her 64s but could not tolerate GI side effects and has never taken again - likely needs medication for osteoporosis and would consider IV infusion as an outpatient  PVD -Has thoracic aortic graft in place -Also with known penetrating ulcer -Infrarenal AAA is increased in size (3.3 x 3.3 cm -> 4.2 x 4.1 cm) -High-grade but unchanged 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 Randie Heinz), plan for outpatient f/u  Dysphagia -Patient with retching "dry heaves" and dysphagia, disinterest in eating/drinking, and "liquid" sensation in her throat -CTA with increasingly patulous appearance and fluid filling in the upper half of the thoracic esophagus -Esophagram ordered -EDP spoke with GI (Dr. Lavon Paganini), who recommended outpatient f/u unless the esophagram is grossly abnormal -Speech therapy swallow evaluation also ordered -She does appear to have thrush (although this tongue coating could simply be from minimal PO intake this week) so  will also treat with nystatin to see if this improves her symptoms  HTN -She does not appear to be taking medications for this issue at this time   Lung nodules -Lung nodules noted on CT, will need outpatient f/u with repeat CT in 6-12 months  DNR -I have discussed code status with the patient and she would not desire resuscitation and would prefer to die a natural death  should that situation arise. -She will need a gold out of facility DNR form at the time of discharge     Advance Care Planning:   Code Status: DNR   Consults: IR; GI and vascular by telephone only; TOC team; nutrition; PT/OT; SLP  DVT Prophylaxis: SCDs  Family Communication: None present; I was unable to reach her daughter by telephone at the time of admission  Severity of Illness: The appropriate patient status for this patient is INPATIENT. Inpatient status is judged to be reasonable and necessary in order to provide the required intensity of service to ensure the patient's safety. The patient's presenting symptoms, physical exam findings, and initial radiographic and laboratory data in the context of their chronic comorbidities is felt to place them at high risk for further clinical deterioration. Furthermore, it is not anticipated that the patient will be medically stable for discharge from the hospital within 2 midnights of admission.   * I certify that at the point of admission it is my clinical judgment that the patient will require inpatient hospital care spanning beyond 2 midnights from the point of admission due to high intensity of service, high risk for further deterioration and high frequency of surveillance required.*  Author: Jonah Blue, MD 02/07/2022 11:09 AM  For on call review [email or web address].

## 2022-02-07 NOTE — Evaluation (Signed)
Clinical/Bedside Swallow Evaluation Patient Details  Name: Stephanie Collier MRN: [id number] Date of Birth: 01-19-1935  Today's Date: 02/07/2022 Time: SLP Start Time (ACUTE ONLY): 1505 SLP Stop Time (ACUTE ONLY): 1525 SLP Time Calculation (min) (ACUTE ONLY): 20 min  Past Medical History:  Past Medical History:  Diagnosis Date   AAA (abdominal aortic aneurysm) ([place])    Arthritis    hip   Hypertension    no meds now   Osteoporosis    Patellar fracture    left   Raynaud's disease    Scoliosis    Past Surgical History:  Past Surgical History:  Procedure Laterality Date   APPENDECTOMY     at age 54   BREAST BIOPSY Left    CHEST TUBE INSERTION Left 04/04/2014   Procedure: CHEST TUBE INSERTION;  Surgeon: Serafina Mitchell, MD;  Location: [facility];  Service: Vascular;  Laterality: Left;  by Dr. Vernetta Honey   INSERTION OF VENA CAVA FILTER N/A 04/02/2014   Procedure: INSERTION OF VENA CAVA FILTER;  Surgeon: Serafina Mitchell, MD;  Location: [facility];  Service: Cardiovascular;  Laterality: N/A;   INTRAMEDULLARY (IM) NAIL INTERTROCHANTERIC Left 06/04/2021   Procedure: INTRAMEDULLARY (IM) NAIL INTERTROCHANTRIC;  Surgeon: Georgeanna Harrison, MD;  Location: [facility];  Service: Orthopedics;  Laterality: Left;   ORIF PATELLA Left 11/20/2020   Procedure: OPEN REDUCTION INTERNAL (ORIF) FIXATION PATELLA;  Surgeon: Willaim Sheng, MD;  Location: [facility];  Service: Orthopedics;  Laterality: Left;   PERIPHERAL VASCULAR CATHETERIZATION N/A 06/26/2014   Procedure: IVC Filter Removal;  Surgeon: Serafina Mitchell, MD;  Location: [facility];  Service: Cardiovascular;  Laterality: N/A;   THORACIC AORTIC ENDOVASCULAR STENT GRAFT N/A 04/04/2014   Procedure: THORACIC AORTIC ENDOVASCULAR STENT GRAFT;  Surgeon: Serafina Mitchell, MD;  Location: [facility] OR;  Service: Vascular;  Laterality: N/A;   HPI:  Patient is an 87 y.o. female with PMH: AAA, HTN, COPD, Raynaud's. She presented to the hospital on 02/07/2022 with  back pain as well as difficulty eating and drinking. MRI showed compression fractures earlier this month. Per MD note, patient with retching "dry heaves" and dysphagia, disinterest in eating/drinking and "liquid" sensation in throat. CTA with increasingly patulous appearance and fluid filling upper half of thoracic esophagus; Esophagram recommended by GI and was ordered as well as SLP swallow evaluation.    Assessment / Plan / Recommendation  Clinical Impression  Patient is not currently presenting with clinical s/s of dysphagia as per this bedside swallow evaluation. She herself denies any h/o or current oral or pharyngeal phase dysphagia. She did report that when she recently was drinking some cold water, she vomited it up, along with "a lot of mucous stuff" and she talked of long h/o not drinking cold liquids. (? esophageal spasming) Patient did not exhibit any overt s/s aspiration or penetration with straw sips of thin liquids (water) and swallow initiation appeared timely. SLP not recommending further skilled treatment and suspect patient's symptoms are esophageal in nature. SLP did discuss the Esophagram test briefly and patient then telling SLP she was feeling better and didn't think she wanted to do that test. SLP informed MD. SLP Visit Diagnosis: Dysphagia, unspecified (R13.10)    Aspiration Risk  No limitations;Mild aspiration risk    Diet Recommendation Dysphagia 3 (Mech soft);Regular;Thin liquid   Liquid Administration via: Cup;Straw Medication Administration: Whole meds with liquid Supervision: Patient able to self feed Compensations: Slow rate;Small sips/bites Postural Changes: Seated upright at 90 degrees;Remain upright  for at least 30 minutes after po intake    Other  Recommendations Oral Care Recommendations: Oral care BID;Patient independent with oral care    Recommendations for follow up therapy are one component of a multi-disciplinary discharge planning process, led by the  attending physician.  Recommendations may be updated based on patient status, additional functional criteria and insurance authorization.  Follow up Recommendations No SLP follow up      Assistance Recommended at Discharge  N/A  Functional Status Assessment Patient has not had a recent decline in their functional status  Frequency and Duration   N/A         Prognosis   N/A     Swallow Study   General Date of Onset: 02/07/22 HPI: Patient is an 87 y.o. female with PMH: AAA, HTN, COPD, Raynaud's. She presented to the hospital on 02/07/2022 with back pain as well as difficulty eating and drinking. MRI showed compression fractures earlier this month. Per MD note, patient with retching "dry heaves" and dysphagia, disinterest in eating/drinking and "liquid" sensation in throat. CTA with increasingly patulous appearance and fluid filling upper half of thoracic esophagus; Esophagram recommended by GI and was ordered as well as SLP swallow evaluation. Type of Study: Bedside Swallow Evaluation Previous Swallow Assessment: none found Diet Prior to this Study: Thin liquids Temperature Spikes Noted: No Respiratory Status: Room air History of Recent Intubation: No Behavior/Cognition: Alert;Pleasant mood;Cooperative Oral Cavity Assessment: Within Functional Limits Oral Care Completed by SLP: No Oral Cavity - Dentition: Adequate natural dentition Vision: Functional for self-feeding Self-Feeding Abilities: Able to feed self Baseline Vocal Quality: Normal Volitional Cough: Strong Volitional Swallow: Able to elicit    Oral/Motor/Sensory Function Overall Oral Motor/Sensory Function: Within functional limits   Ice Chips     Thin Liquid Thin Liquid: Within functional limits Presentation: Straw;Self Fed    Nectar Thick     Honey Thick     Puree Puree: Not tested   Solid     Solid: Not tested      Sonia Baller, MA, CCC-SLP Speech Therapy

## 2022-02-07 NOTE — ED Notes (Signed)
Patient transported to MRI 

## 2022-02-08 ENCOUNTER — Inpatient Hospital Stay (HOSPITAL_COMMUNITY): Payer: Medicare Other

## 2022-02-08 DIAGNOSIS — M549 Dorsalgia, unspecified: Secondary | ICD-10-CM | POA: Diagnosis not present

## 2022-02-08 LAB — BASIC METABOLIC PANEL
Anion gap: 10 (ref 5–15)
BUN: 11 mg/dL (ref 8–23)
CO2: 22 mmol/L (ref 22–32)
Calcium: 8.5 mg/dL — ABNORMAL LOW (ref 8.9–10.3)
Chloride: 105 mmol/L (ref 98–111)
Creatinine, Ser: 0.42 mg/dL — ABNORMAL LOW (ref 0.44–1.00)
GFR, Estimated: >60 mL/min (ref >60)
Glucose, Bld: 87 mg/dL (ref 70–99)
Potassium: 4.1 mmol/L (ref 3.5–5.1)
Sodium: 137 mmol/L (ref 135–145)

## 2022-02-08 LAB — CBC
HCT: 37.5 % (ref 36.0–46.0)
Hemoglobin: 12.1 g/dL (ref 12.0–15.0)
MCH: 31.4 pg (ref 26.0–34.0)
MCHC: 32.3 g/dL (ref 30.0–36.0)
MCV: 97.4 fL (ref 80.0–100.0)
Platelets: 149 10*3/uL — ABNORMAL LOW (ref 150–400)
RBC: 3.85 MIL/uL — ABNORMAL LOW (ref 3.87–5.11)
RDW: 13.7 % (ref 11.5–15.5)
WBC: 5.7 10*3/uL (ref 4.0–10.5)
nRBC: 0 % (ref 0.0–0.2)

## 2022-02-08 MED ORDER — PANTOPRAZOLE SODIUM 40 MG PO TBEC
40.0000 mg | DELAYED_RELEASE_TABLET | Freq: Two times a day (BID) | ORAL | Status: DC
  Administered 2022-02-09 – 2022-02-10 (×3): 40 mg via ORAL
  Filled 2022-02-08 (×8): qty 1

## 2022-02-08 NOTE — Evaluation (Signed)
Physical Therapy Evaluation Patient Details Name: Baylynn Shifflett MRN: [id number] DOB: [birth year] Today's Date: 02/08/2022  History of Present Illness  87 y.o. female presents to [facility] hospital on 02/06/2022 with back pain. MRI 1/28 demonstrates acute/subacute L3/4 and T10 compression fxs, also with severe T5-7 compression fxs. PMH includes AAA, HTN, thoracic aortic aneurysm, COPD, raynaud's.  Clinical Impression  Pt presents to PT with deficits in functional mobility, balance, strength, power, endurance. Pt is limited by reports of nausea and lightheadedness in sitting, she attributes this to eating minimally and lying flat for consecutive days. Pt does appear to tolerate bed mobility well with use of log roll technique. PT will continue to follow in an effort to progress to ambulation. PT recommends SNF placement at this time, final rehab recommendations will largely depend on progress after potential kyphoplasty.       Recommendations for follow up therapy are one component of a multi-disciplinary discharge planning process, led by the attending physician.  Recommendations may be updated based on patient status, additional functional criteria and insurance authorization.  Follow Up Recommendations Skilled nursing-short term rehab (<3 hours/day) Can patient physically be transported by private vehicle: No    Assistance Recommended at Discharge Intermittent Supervision/Assistance  Patient can return home with the following  A lot of help with walking and/or transfers;A lot of help with bathing/dressing/bathroom;Assistance with cooking/housework;Assist for transportation;Help with stairs or ramp for entrance    Equipment Recommendations Wheelchair (measurements PT)  Recommendations for Other Services       Functional Status Assessment Patient has had a recent decline in their functional status and demonstrates the ability to make significant improvements in function in a reasonable and predictable  amount of time.     Precautions / Restrictions Precautions Precautions: Fall;Back Precaution Booklet Issued: No Precaution Comments: verbally reviewed back precautions for comfort Restrictions Weight Bearing Restrictions: No      Mobility  Bed Mobility Overal bed mobility: Needs Assistance Bed Mobility: Rolling, Sidelying to Sit, Sit to Sidelying Rolling: Min guard Sidelying to sit: Min assist, HOB elevated     Sit to sidelying: Min assist      Transfers Overall transfer level:  (pt declines attempts due to nausea)                      Ambulation/Gait                  Stairs            Wheelchair Mobility    Modified Rankin (Stroke Patients Only)       Balance Overall balance assessment: Needs assistance Sitting-balance support: Single extremity supported, Feet unsupported Sitting balance-Leahy Scale: Fair                                       Pertinent Vitals/Pain Pain Assessment Pain Assessment: Faces Faces Pain Scale: Hurts little more Pain Location: back Pain Descriptors / Indicators: Grimacing Pain Intervention(s): Monitored during session    Home Living Family/patient expects to be discharged to:: Private residence Living Arrangements: Children Available Help at Discharge: Family;Available 24 hours/day Type of Home: House Home Access: Stairs to enter Entrance Stairs-Rails: Left Entrance Stairs-Number of Steps: 3   Home Layout: One level Home Equipment: Conservation officer, nature (2 wheels);Rollator (4 wheels);Cane - quad;Shower seat      Prior Function Prior Level of Function : Independent/Modified Independent;History of Falls (last six  months)             Mobility Comments: ambulatory with Rollator outdoors, RW vs no device indoors, utilizes quad cane for stair negotiation ADLs Comments: mod independent for ADL and light home management at RW level     Hand Dominance   Dominant Hand: Right     Extremity/Trunk Assessment   Upper Extremity Assessment Upper Extremity Assessment: Generalized weakness    Lower Extremity Assessment Lower Extremity Assessment: Generalized weakness    Cervical / Trunk Assessment Cervical / Trunk Assessment: Kyphotic  Communication   Communication: No difficulties  Cognition Arousal/Alertness: Awake/alert Behavior During Therapy: WFL for tasks assessed/performed Overall Cognitive Status: Within Functional Limits for tasks assessed                                          General Comments General comments (skin integrity, edema, etc.): VSS, pt on 2L [place], pt reports lightheadedness and nausea in sitting, BP 163/84, PT notes hypertension throughout the day    Exercises     Assessment/Plan    PT Assessment Patient needs continued PT services  PT Problem List Decreased strength;Decreased activity tolerance;Decreased balance;Decreased mobility;Pain       PT Treatment Interventions DME instruction;Gait training;Functional mobility training;Stair training;Therapeutic activities;Therapeutic exercise;Balance training;Neuromuscular re-education;Patient/family education    PT Goals (Current goals can be found in the Care Plan section)  Acute Rehab PT Goals Patient Stated Goal: to reduce back pain, return to prior level of mobility PT Goal Formulation: With patient Time For Goal Achievement: 02/22/22 Potential to Achieve Goals: Fair    Frequency Min 3X/week     Co-evaluation               AM-PAC PT "6 Clicks" Mobility  Outcome Measure Help needed turning from your back to your side while in a flat bed without using bedrails?: A Little Help needed moving from lying on your back to sitting on the side of a flat bed without using bedrails?: A Little Help needed moving to and from a bed to a chair (including a wheelchair)?: Total Help needed standing up from a chair using your arms (e.g., wheelchair or bedside chair)?:  Total Help needed to walk in hospital room?: Total Help needed climbing 3-5 steps with a railing? : Total 6 Click Score: 10    End of Session   Activity Tolerance: Treatment limited secondary to medical complications (Comment) (nausea) Patient left: in bed;with call bell/phone within reach Nurse Communication: Mobility status PT Visit Diagnosis: Other abnormalities of gait and mobility (R26.89);Muscle weakness (generalized) (M62.81);Pain Pain - part of body:  (back)    Time: 0347-4259 PT Time Calculation (min) (ACUTE ONLY): 37 min   Charges:   PT Evaluation $PT Eval Low Complexity: 1 Low          Zenaida Niece, PT, DPT Acute Rehabilitation Office [place] 02/08/2022, 12:10 PM

## 2022-02-08 NOTE — ED Notes (Signed)
Patient declined her GI/swallow test this am, said she discussed  it with someone yesterday and thought it was not going to be administered, but is very concerned about her diet, wants a regular diet, said the order was supposed to be changed.

## 2022-02-08 NOTE — ED Notes (Signed)
Patient left the floor with staff and her belongings, in stable condition. AOX4.

## 2022-02-08 NOTE — Progress Notes (Signed)
PROGRESS NOTE    Stephanie Collier  BEM:[id number] DOB: Jan 21, 1935 DOA: 02/06/2022 PCP: Lorenda Ishihara     Brief Narrative:  Stephanie Collier is a 87 y.o. female with medical history significant of AAA, HTN, thoracic aortic aneurysm, COPD, and Raynaud's presenting with back pain.  She reports that she had a patella fracture in 11/2020 and recovered fairly well until a hip fracture in 05/2021.  She was again recovering ok until she began having back pain.  She has been seeing orthopedics (Dr. Sherilyn Dacosta) and was referred to Dr. Yevette Edwards for possible kyphoplasty but this has not been approved by insurance yet or scheduled.  Meanwhile, for the last week she has been increasingly less mobile.  For the last few days, she has been unable to get out of bed.  As a result, she is not eating and drinking and is noticing some dysphagia and a "watery" sensation in her throat.     New events last 24 hours / Subjective: Having difficulty getting out of bed due to her back pain. Wants a regular diet, does not have difficulty swallowing or pain with swallowing, although later in the conversation, does admit to coughing with eating at times.  Admits to constipation, her last bowel movement was a week ago.  Assessment & Plan:  Principal Problem:   Intractable back pain Active Problems:   HTN (hypertension)   Aortic aneurysm, thoracic (HCC)   Compression fracture of body of thoracic vertebra (HCC)   Lumbar compression fracture, closed, initial encounter (HCC)   PVD (peripheral vascular disease) (HCC)   Dysphagia   Multiple lung nodules   DNR (do not resuscitate)  Intractable back pain secondary to multiple compression fractures -MRI revealed acute to subacute L3/4 and acute T10 compression fracture as well as T5-7 compression fracture -IR consulted for kyphoplasty -Pain control in the meanwhile -She will need PT OT evaluation  Peripheral vascular disease with history of AAA -Follow-up with outpatient vascular  surgery  Dysphagia -SLP evaluation recommended dysphagia 3 diet.  Patient declined esophagram.  Can follow-up with GI outpatient as needed  Incidental finding of lung nodule -Follow-up outpatient   DVT prophylaxis:  SCDs Start: 02/07/22 1035  Code Status: DNR Family Communication: No family at bedside Disposition Plan:  Status is: Inpatient Remains inpatient appropriate because: Pain control, kyphoplasty   Antimicrobials:  Anti-infectives (From admission, onward)    None        Objective: Vitals:   02/08/22 0451 02/08/22 0811 02/08/22 0908 02/08/22 1200  BP: (!) 160/95  (!) 163/68 134/76  Pulse: 67  73 69  Resp: 18  18 18   Temp: 97.7 F (36.5 C) 97.6 F (36.4 C)    TempSrc: Oral Oral    SpO2: 100%  93% 98%  Weight:      Height:        Intake/Output Summary (Last 24 hours) at 02/08/2022 1223 Last data filed at 02/08/2022 1120 Gross per 24 hour  Intake --  Output 750 ml  Net -750 ml   Filed Weights   02/06/22 2111  Weight: 44 kg    Examination:  General exam: Appears calm and comfortable  Respiratory system: Clear to auscultation. Respiratory effort normal. No respiratory distress. No conversational dyspnea.  Cardiovascular system: S1 & S2 heard, RRR. No murmurs. No pedal edema. Gastrointestinal system: Abdomen is nondistended, soft and nontender. Normal bowel sounds heard. Central nervous system: Alert and oriented. No focal neurological deficits. Speech clear. +TTP T10 and L3/4  Extremities: Symmetric in appearance  Skin: No rashes, lesions or ulcers on exposed skin  Psychiatry: Judgement and insight appear normal. Mood & affect appropriate.   Data Reviewed: I have personally reviewed following labs and imaging studies  CBC: Recent Labs  Lab 02/06/22 2156 02/06/22 2234 02/08/22 0510  WBC 6.3  --  5.7  HGB 13.7 13.3 12.1  HCT 42.2 39.0 37.5  MCV 96.3  --  97.4  PLT 170  --  149*   Basic Metabolic Panel: Recent Labs  Lab 02/06/22 2156  02/06/22 2234 02/08/22 0940  NA 136 138 137  K 3.4* 3.7 4.1  CL 99 97* 105  CO2 27  --  22  GLUCOSE 126* 126* 87  BUN 18 22 11   CREATININE 0.59 0.50 0.42*  CALCIUM 8.7*  --  8.5*   GFR: Estimated Creatinine Clearance: 36.4 mL/min (A) (by C-G formula based on SCr of 0.42 mg/dL (L)). Liver Function Tests: Recent Labs  Lab 02/06/22 2156  AST 23  ALT 13  ALKPHOS 68  BILITOT 1.5*  PROT 5.9*  ALBUMIN 3.2*   Recent Labs  Lab 02/06/22 2156  LIPASE 33   No results for input(s): "AMMONIA" in the last 168 hours. Coagulation Profile: No results for input(s): "INR", "PROTIME" in the last 168 hours. Cardiac Enzymes: No results for input(s): "CKTOTAL", "CKMB", "CKMBINDEX", "TROPONINI" in the last 168 hours. BNP (last 3 results) No results for input(s): "PROBNP" in the last 8760 hours. HbA1C: No results for input(s): "HGBA1C" in the last 72 hours. CBG: No results for input(s): "GLUCAP" in the last 168 hours. Lipid Profile: No results for input(s): "CHOL", "HDL", "LDLCALC", "TRIG", "CHOLHDL", "LDLDIRECT" in the last 72 hours. Thyroid Function Tests: No results for input(s): "TSH", "T4TOTAL", "FREET4", "T3FREE", "THYROIDAB" in the last 72 hours. Anemia Panel: No results for input(s): "VITAMINB12", "FOLATE", "FERRITIN", "TIBC", "IRON", "RETICCTPCT" in the last 72 hours. Sepsis Labs: No results for input(s): "PROCALCITON", "LATICACIDVEN" in the last 168 hours.  Recent Results (from the past 240 hour(s))  Resp panel by RT-PCR (RSV, Flu A&B, Covid) Urine, Clean Catch     Status: None   Collection Time: 02/06/22  9:59 PM   Specimen: Urine, Clean Catch; Nasal Swab  Result Value Ref Range Status   SARS Coronavirus 2 by RT PCR NEGATIVE NEGATIVE Final    Comment: (NOTE) SARS-CoV-2 target nucleic acids are NOT DETECTED.  The SARS-CoV-2 RNA is generally detectable in upper respiratory specimens during the acute phase of infection. The lowest concentration of SARS-CoV-2 viral copies  this assay can detect is 138 copies/mL. A negative result does not preclude SARS-Cov-2 infection and should not be used as the sole basis for treatment or other patient management decisions. A negative result may occur with  improper specimen collection/handling, submission of specimen other than nasopharyngeal swab, presence of viral mutation(s) within the areas targeted by this assay, and inadequate number of viral copies(<138 copies/mL). A negative result must be combined with clinical observations, patient history, and epidemiological information. The expected result is Negative.  Fact Sheet for Patients:  BloggerCourse.comhttps://www.fda.gov/media/152166/download  Fact Sheet for Healthcare Providers:  SeriousBroker.ithttps://www.fda.gov/media/152162/download  This test is no t yet approved or cleared by the [place] FDA and  has been authorized for detection and/or diagnosis of SARS-CoV-2 by FDA under an Emergency Use Authorization (EUA). This EUA will remain  in effect (meaning this test can be used) for the duration of the COVID-19 declaration under Section 564(b)(1) of the Act, 21 U.S.C.section 360bbb-3(b)(1), unless the authorization is terminated  or revoked  sooner.       Influenza A by PCR NEGATIVE NEGATIVE Final   Influenza B by PCR NEGATIVE NEGATIVE Final    Comment: (NOTE) The Xpert Xpress SARS-CoV-2/FLU/RSV plus assay is intended as an aid in the diagnosis of influenza from Nasopharyngeal swab specimens and should not be used as a sole basis for treatment. Nasal washings and aspirates are unacceptable for Xpert Xpress SARS-CoV-2/FLU/RSV testing.  Fact Sheet for Patients: BloggerCourse.com  Fact Sheet for Healthcare Providers: SeriousBroker.it  This test is not yet approved or cleared by the [place] FDA and has been authorized for detection and/or diagnosis of SARS-CoV-2 by FDA under an Emergency Use Authorization (EUA). This EUA  will remain in effect (meaning this test can be used) for the duration of the COVID-19 declaration under Section 564(b)(1) of the Act, 21 U.S.C. section 360bbb-3(b)(1), unless the authorization is terminated or revoked.     Resp Syncytial Virus by PCR NEGATIVE NEGATIVE Final    Comment: (NOTE) Fact Sheet for Patients: BloggerCourse.com  Fact Sheet for Healthcare Providers: SeriousBroker.it  This test is not yet approved or cleared by the [place] FDA and has been authorized for detection and/or diagnosis of SARS-CoV-2 by FDA under an Emergency Use Authorization (EUA). This EUA will remain in effect (meaning this test can be used) for the duration of the COVID-19 declaration under Section 564(b)(1) of the Act, 21 U.S.C. section 360bbb-3(b)(1), unless the authorization is terminated or revoked.  Performed at [facility], [address], [place], [place] [address]       Radiology Studies: MR LUMBAR SPINE WO CONTRAST  Result Date: 02/07/2022 CLINICAL DATA:  Low back pain.  Compression fractures EXAM: MRI LUMBAR SPINE WITHOUT CONTRAST TECHNIQUE: Multiplanar, multisequence MR imaging of the lumbar spine was performed. No intravenous contrast was administered. COMPARISON:  CT 02/07/2022, 12/29/2017 FINDINGS: Segmentation:  Standard. Alignment: Lumbar dextrocurvature. Grade 1 anterolisthesis of L5 on S1. Slight retrolisthesis at L1-L2. Vertebrae: Superior endplate compression fracture of the L3 vertebral body with approximately 40% vertebral body height loss. Bone marrow edema throughout the vertebral body compatible with an acute or subacute process. Minimal retropulsion at the superior endplate. No fracture extension into the posterior elements. Superior endplate compression fracture of the L4 vertebral body with approximately 45% vertebral body height loss. Minimal retropulsion at the superior endplate. Faint marrow edema favors  a subacute process. The remaining lumbar vertebral body heights are maintained. No additional fractures. Hyperintense T2/STIR signal within the L2-3 and L3-4 disc spaces are favored to be posttraumatic without secondary findings to suggest discitis. No suspicious marrow replacing bone lesion. Conus medullaris and cauda equina: Conus extends to the L1-2 level. Conus and cauda equina appear normal. Paraspinal and other soft tissues: Known infrarenal abdominal aortic aneurysm and bilateral iliac artery aneurysms, as characterized on same day dedicated CT angiogram. Disc levels: T12-L1: Mild disc bulge without foraminal or canal stenosis. L1-L2: Mild disc bulge and bilateral facet hypertrophy. Mild left foraminal stenosis. No canal stenosis. L2-L3: Mild disc bulge with L3 superior endplate retropulsion. Mild-to-moderate bilateral facet arthropathy. No significant foraminal or canal stenosis. L3-L4: Diffuse disc bulge with mild retropulsion of the L4 superior endplate. Moderate bilateral facet arthropathy. Left greater than right subarticular recess stenosis without canal stenosis. Moderate left and mild right foraminal stenosis. L4-L5: Mild disc bulge with shallow disc protrusion in the right subarticular zone. Moderate facet arthropathy. No foraminal or canal stenosis. L5-S1: Minimal disc bulge. Bilateral facet arthropathy. No foraminal or canal stenosis. IMPRESSION: 1. Acute-to-subacute  superior endplate compression fractures of the L3 and L4 vertebral bodies with minimal retropulsion of both superior endplates. 2. Multilevel degenerative changes of the lumbar spine, as described above. No significant canal stenosis at any level. 3. Moderate left and mild right foraminal stenosis at L3-L4. 4. Known infrarenal abdominal aortic aneurysm and bilateral iliac artery aneurysms, as characterized on same day dedicated CT angiogram. Electronically Signed   By: Davina Poke D.O.   On: 02/07/2022 09:29   MR THORACIC SPINE  WO CONTRAST  Result Date: 02/07/2022 CLINICAL DATA:  87 year old female with mid back pain. T5 through T7 compression fractures demonstrated on MRI earlier this month. New T10 vertebral body suspected on CT this morning, along with age indeterminate L3 and L4 compression fractures. EXAM: MRI THORACIC SPINE WITHOUT CONTRAST TECHNIQUE: Multiplanar, multisequence MR imaging of the thoracic spine was performed. No intravenous contrast was administered. COMPARISON:  CTA CT Chest, Abdomen, and Pelvis, CT thoracic and lumbar spine 0306 hours today. Recent thoracic MRI 01/14/2022.  Cervical spine CT 11/13/2020. FINDINGS: Limited cervical spine imaging:  Unremarkable for age. Thoracic spine segmentation: Normal by CT this morning, same numbering system used on 01/14/2022. Alignment: Midthoracic kyphosis does not appear significantly changed from earlier this month. Vertebrae: Severe T5, T6 and T7 compression fractures with near vertebra plana and ongoing marrow edema. Patchy new anterior superior endplate marrow edema at both the T4 inferior and T8 superior endplates on series 4, image 11. Vertebral body height at both levels appears maintained. T10 inferior endplate compression fracture with patchy new marrow edema and 25% loss of height since 01/14/2022. Mild retropulsion of the T10 posteroinferior endplate. T10 posterior elements appear intact. Other thoracic levels appears stable and intact. Lumbar vertebrae are reported separately today. Cord: Stable, normal. Capacious thoracic spinal canal at most levels. Conus medullaris is at T12-L1. Paraspinal and other soft tissues: Stable to the CT Chest, Abdomen, and Pelvis this morning. Disc levels: Unchanged thoracic spine degeneration which is generally mild for age. No spinal stenosis, even when considering the mild compression fracture related retropulsion. IMPRESSION: 1. Acute T10 compression fracture with 25% loss of vertebral body height is new since 01/14/2022. No  significant retropulsion or other complicating features. 2. Severe T5 through T7 compression fractures (near vertebra plana) with ongoing marrow edema. New anterior endplate marrow edema at the adjacent T4 and T8 levels is favored to be reactive, rather than due to impending collapse. 3. No thoracic spinal stenosis or spinal cord signal abnormality. 4. Lumbar MRI today reported separately. Electronically Signed   By: Genevie Ann M.D.   On: 02/07/2022 09:21   CT L-SPINE NO CHARGE  Result Date: 02/07/2022 CLINICAL DATA:  Back pain. EXAM: CT Thoracic and Lumbar spine without contrast TECHNIQUE: Multiplanar CT images of the thoracic and lumbar spine were reconstructed from contemporary CTA of the Chest, Abdomen, and Pelvis. RADIATION DOSE REDUCTION: This exam was performed according to the departmental dose-optimization program which includes automated exposure control, adjustment of the mA and/or kV according to patient size and/or use of iterative reconstruction technique. CONTRAST:  None or No additional COMPARISON:  Thoracic spine MRI recently 01/14/2022. CTA chest, abdomen and pelvis 12/29/2017. FINDINGS: CT THORACIC SPINE FINDINGS Alignment: There is a nearly 60 degrees kyphotic angle centered at C5-7, mild dextroscoliosis. This was seen previously. Vertebrae: Generalized osteopenia. Moderate to severe subacute compression fractures of T5, T6 and T7 are again noted, with slight retropulsion. There are mild but increased concavities of the upper and lower plates of the Y10 vertebral  body consistent with a mild central compression fracture new from 01/14/2022. The central height loss no more than 20%, without significant anterior or posterior height loss. All pedicles and posterior elements are intact. No aggressive primary bone lesion is seen. Other thoracic vertebrae are preserved in height. There is thoracic spondylosis. Paraspinal and other soft tissues: No paraspinal hematoma. Other soft tissue findings  described separately. Disc levels: Disc space loss T5-8 appears similar with otherwise normal disc heights. Small disc protrusions are again noted and T11-12 and T10-11 and mild disc bulging at T12-L1 and L1-2. There is mild bilateral foraminal stenosis at T7-8 due to the T7 compression fracture. Other foramina appear generally clear despite slight facet spurring at most levels. CT LUMBAR SPINE FINDINGS Segmentation: 5 lumbar type vertebrae. Alignment: Chronic mild-to-moderate dextro rotary scoliosis, apex L2-3. Vertebrae: Osteopenia. There are new age-indeterminate superior endplate compression fractures of L3 and L4, both with slight posterosuperior retropulsion, up to 2 mm retropulsion at L3 and after 3-4 mm retropulsion at L4. There is no bony fragmentation anteriorly in these may be subacute, but there is sclerosis still significant vertebral bodies consistent with a healing response. Loss of vertebral height at L3 is 40-50% anteriorly and posteriorly, 50-60% centrally. Loss of vertebral height at L4 is 50% anteriorly and centrally 30-40% posteriorly. Other vertebrae are normal in heights. No primary bone lesion is seen. Paraspinal and other soft tissues: Enlarging infrarenal AAA, described and reported separately. No paraspinal hematoma. Disc levels: There are normal lumbar disc heights from T12-L1 down. There are mild disc bulges T12-L1 and L1-2, with posterior disc osteophyte complexes L2-3 and L3-4 causing moderate spinal canal stenosis in combination with hypertrophic facet changes. There are mild nonstenosing disc bulges at L4-5 and L5-S1. Facet hypertrophy is seen at all levels but is greatest from L3-4 down. The foramina however, are not significantly stenotic. Other: All pedicles and posterior elements are intact. There is no displaced sacral insufficiency fracture. IMPRESSION: 1. Osteopenia and degenerative change. 2. Moderate to severe subacute compression fractures of T5, T6, and T7 with slight  retropulsion. Unchanged from 01/14/2022 MRI. 3. Mild central compression fracture of the T10 vertebral body, new from 01/14/2022. 4. Age-indeterminate superior endplate compression fractures of L3 and L4, with slight retropulsion at L3 and L4. The gross appearance suggests they are probably nonacute, most likely subacute or chronic. 5. Enlarging infrarenal AAA, described and reported separately. Electronically Signed   By: Almira Bar M.D.   On: 02/07/2022 05:38   CT T-SPINE NO CHARGE  Result Date: 02/07/2022 CLINICAL DATA:  Back pain. EXAM: CT Thoracic and Lumbar spine without contrast TECHNIQUE: Multiplanar CT images of the thoracic and lumbar spine were reconstructed from contemporary CTA of the Chest, Abdomen, and Pelvis. RADIATION DOSE REDUCTION: This exam was performed according to the departmental dose-optimization program which includes automated exposure control, adjustment of the mA and/or kV according to patient size and/or use of iterative reconstruction technique. CONTRAST:  None or No additional COMPARISON:  Thoracic spine MRI recently 01/14/2022. CTA chest, abdomen and pelvis 12/29/2017. FINDINGS: CT THORACIC SPINE FINDINGS Alignment: There is a nearly 60 degrees kyphotic angle centered at C5-7, mild dextroscoliosis. This was seen previously. Vertebrae: Generalized osteopenia. Moderate to severe subacute compression fractures of T5, T6 and T7 are again noted, with slight retropulsion. There are mild but increased concavities of the upper and lower plates of the N82 vertebral body consistent with a mild central compression fracture new from 01/14/2022. The central height loss no more than 20%, without  significant anterior or posterior height loss. All pedicles and posterior elements are intact. No aggressive primary bone lesion is seen. Other thoracic vertebrae are preserved in height. There is thoracic spondylosis. Paraspinal and other soft tissues: No paraspinal hematoma. Other soft tissue  findings described separately. Disc levels: Disc space loss T5-8 appears similar with otherwise normal disc heights. Small disc protrusions are again noted and T11-12 and T10-11 and mild disc bulging at T12-L1 and L1-2. There is mild bilateral foraminal stenosis at T7-8 due to the T7 compression fracture. Other foramina appear generally clear despite slight facet spurring at most levels. CT LUMBAR SPINE FINDINGS Segmentation: 5 lumbar type vertebrae. Alignment: Chronic mild-to-moderate dextro rotary scoliosis, apex L2-3. Vertebrae: Osteopenia. There are new age-indeterminate superior endplate compression fractures of L3 and L4, both with slight posterosuperior retropulsion, up to 2 mm retropulsion at L3 and after 3-4 mm retropulsion at L4. There is no bony fragmentation anteriorly in these may be subacute, but there is sclerosis still significant vertebral bodies consistent with a healing response. Loss of vertebral height at L3 is 40-50% anteriorly and posteriorly, 50-60% centrally. Loss of vertebral height at L4 is 50% anteriorly and centrally 30-40% posteriorly. Other vertebrae are normal in heights. No primary bone lesion is seen. Paraspinal and other soft tissues: Enlarging infrarenal AAA, described and reported separately. No paraspinal hematoma. Disc levels: There are normal lumbar disc heights from T12-L1 down. There are mild disc bulges T12-L1 and L1-2, with posterior disc osteophyte complexes L2-3 and L3-4 causing moderate spinal canal stenosis in combination with hypertrophic facet changes. There are mild nonstenosing disc bulges at L4-5 and L5-S1. Facet hypertrophy is seen at all levels but is greatest from L3-4 down. The foramina however, are not significantly stenotic. Other: All pedicles and posterior elements are intact. There is no displaced sacral insufficiency fracture. IMPRESSION: 1. Osteopenia and degenerative change. 2. Moderate to severe subacute compression fractures of T5, T6, and T7 with  slight retropulsion. Unchanged from 01/14/2022 MRI. 3. Mild central compression fracture of the T10 vertebral body, new from 01/14/2022. 4. Age-indeterminate superior endplate compression fractures of L3 and L4, with slight retropulsion at L3 and L4. The gross appearance suggests they are probably nonacute, most likely subacute or chronic. 5. Enlarging infrarenal AAA, described and reported separately. Electronically Signed   By: Telford Nab M.D.   On: 02/07/2022 05:38   CT Angio Chest/Abd/Pel for Dissection W and/or Wo Contrast  Result Date: 02/07/2022 CLINICAL DATA:  Acute aortic syndrome suspected. History of thoracic aortic stent graft and AAA. EXAM: CT ANGIOGRAPHY CHEST, ABDOMEN AND PELVIS TECHNIQUE: Axial chest CT without contrast was initially obtained. Multidetector CT imaging through the chest, abdomen and pelvis was performed using the standard protocol during bolus administration of intravenous contrast. Multiplanar reconstructed images and MIPs were obtained and reviewed to evaluate the vascular anatomy. RADIATION DOSE REDUCTION: This exam was performed according to the departmental dose-optimization program which includes automated exposure control, adjustment of the mA and/or kV according to patient size and/or use of iterative reconstruction technique. CONTRAST:  8mL OMNIPAQUE IOHEXOL 350 MG/ML SOLN COMPARISON:  CTA chest, abdomen and pelvis 12/29/2017. MRI thoracic spine 01/14/2022. Last chest film was portable 06/03/2021. FINDINGS: CTA CHEST FINDINGS Cardiovascular: There is moderate panchamber cardiomegaly with left chamber predominance. Three-vessel coronary artery calcifications greatest in the LAD. There are calcific plaques in the aorta and scattered calcifications in the great vessels. The great vessels show tortuosity without stenosis. There is a small increased pericardial effusion. The heart has increased in  size since 2019. The superior pulmonary veins are prominent. The pulmonary  trunk caliber, ectatic measuring 3.3 cm indicating arterial hypertension. There is no central embolus. Interval slight dilatation aortic root at the sinuses of Valsalva where the diameter is 4.2 cm previously 4 cm. Stable ectasia in the ascending aorta, measuring 3.9 cm. The arch segment again measures 2.8 cm. A thoracic aortic stent graft again extends from 1.3 cm distal to the left subclavian takeoff down to T10-11. No focal recurrent aneurysm is seen but the descending segment remains ectatic at 3.5 cm. No acute stroke complication is seen, no aortic dissection, with moderate aortic tortuosity. A penetrating ulcer is again noted just beyond the distal graft landing zone along the left lateral wall where the aortic diameter is 3.2 cm, previously 3 cm. Mediastinum/Nodes: No enlarged mediastinal, hilar, or axillary lymph nodes. Thyroid gland is unremarkable. The trachea and central airways are prominent but clear. There is an increasingly patulous appearance and fluid filling in the upper half of the thoracic esophagus. No wall thickening or hiatal hernia. Lungs/Pleura: Diffuse bronchial thickening especially in the lower lobes. Mild chronic elevation right diaphragm. There is chronic change with subpleural reticulation, stable 4 mm right apical nodule on 8:20, stable 3 mm subpleural nodule laterally in the right upper lobe on 8:55. There is a new pleural-based squarish 6 mm noncalcified nodule in the posterior right apex on 8:20. There is increased medial left apical pleural-parenchymal scar-like opacity without a nodule like appearance. There are bilateral minimal pleural effusions not seen previously. No pneumothorax is seen or acute lung infiltrates or edema. Musculoskeletal: Osteopenia. There is thoracic kyphosis and again noted moderate to severe anterior wedge compression fractures of the T5-7 vertebrae, all having marrow edema on the recent MRI but without interval increasing retropulsion. The ribcage is  intact. No chest wall masses. Review of the MIP images confirms the above findings. CTA ABDOMEN AND PELVIS FINDINGS VASCULAR Aorta: The hiatal aortic segment unchanged, measuring 2.8 cm. There is moderate to heavy calcific wall plaque an enlarging infrarenal fusiform AAA now measuring 4.2 x 4.1 cm, was previously 3.3 x 3.3 cm with moderate thrombosis along its wall. No dissection. Celiac: Patent without evidence of aneurysm, dissection, vasculitis or significant stenosis. There are nonstenosing ostial calcifications. The vessel arises from the left lateral aortic wall SMA: Small amount of non stenosing mixed plaque. The vessel is patent without evidence of aneurysm, dissection, vasculitis or significant stenosis. It arises from the left anterolateral wall. Renals: Both are single. Again noted unchanged there is high-grade calcific origin stenosis on the left and no significant stenosis on the right. IMA: Small caliber but without focal stenosis, arises just above the infrarenal AAA. Inflow: Both common iliac arteries are heavily calcified. On the left there is diffuse increased aneurysmal dilatation and increased wall thrombus, maximum diameter now 2.7 cm, previously 2.3 cm. On the right there is diffuse common iliac aneurysmal dilatation, scattered wall thrombus and maximum diameter now 2.2 cm, previously 2.0 cm. No dissection or focal stenosis. The external iliac arteries are tortuous but are relatively clear. Both internal iliac arteries with patchy calcifications without stenosis with vessel tortuosity. 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 Veins: Unopacified and not evaluated Review of the MIP images confirms the above findings. NON-VASCULAR Hepatobiliary: No apparent  masses seen. The gallbladder and bile ducts are  unremarkable. Pancreas: No significant abnormality. 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 Spleen: No abnormality. Heterogeneous enhancement consistent with bolus phase. Adrenals/Urinary Tract: Scar-like cortical defect posterior left upper pole, unchanged. No adrenal or renal mass enhancement. No urinary stone or obstruction. No bladder thickening is seen. Stomach/Bowel: No dilatation or wall thickening. An appendix is not seen in this patient. There is colonic diverticulosis without evidence of focal diverticulitis. Lymphatic: No adenopathy is seen. Reproductive: Fibroid uterus.  No adnexal mass. Other: No free air, free hemorrhage, free fluid or incarcerated hernia. Musculoskeletal: Age-indeterminate moderate compression fractures of the L3 and 4 vertebral bodies with slight retropulsion new from 2019. There is dextro rotary scoliosis apex at L2-3. Prior left hip nailing. Review of the MIP images confirms the above findings. IMPRESSION: 1. No evidence of acute aortic syndrome. 2. Cardiomegaly with prominent superior pulmonary veins and minimal pleural effusions. No appreciable edema. 3. Chronic lung changes with a new 6 mm posterior right apical nodule. Per Fleischner Society Guidelines, recommend a non-contrast Chest CT at 6-12 months. If patient is high risk for malignancy, consider an additional non-contrast Chest CT at 18-24 months. If patient is low risk for malignancy, non-contrast Chest CT at 18-24 months is optional. These guidelines do not apply to immunocompromised patients and patients with cancer. Follow up in patients with significant comorbidities as clinically warranted. For lung cancer screening, adhere to Lung-RADS guidelines. Reference: Radiology. 2017; 284(1):228-43. 4. Increasingly patulous appearance and fluid filling the upper half of the thoracic esophagus. This could be due to reflux or dysmotility. No wall thickening or hiatal hernia. 5. Dilated aortic root at 4.2 cm,  previously 4 cm. Recommend annual imaging followup by CTA or MRA. This recommendation follows 2010 ACCF/AHA/AATS/ACR/ASA/SCA/SCAI/SIR/STS/SVM Guidelines for the Diagnosis and Management of Patients with Thoracic Aortic Disease. Circulation. 2010; 121: Z610-R604: E266-e369. Aortic aneurysm NOS (ICD10-I71.9). Stable ectasia in the ascending aorta, 3.9 cm. 6. 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 7. Increasing size of infrarenal fusiform AAA now 4.2 x 4.1 cm, previously 3.3 x 3.3 cm with moderate thrombosis along its wall. Vascular surgery referral recommended unless already none. 8. 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 9. 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 10. 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 11. Age-indeterminate but subacute moderate compression fractures of T5-7, all having marrow edema on the recent MRI. 12. Age-indeterminate moderate wedge compression fractures of L3 and L4, with slight retropulsion. 13. Remaining findings discussed above. Electronically Signed   By: Almira BarKeith  Chesser M.D.   On: 02/07/2022 05:09      Scheduled Meds:  calcitonin (salmon)  1 spray Alternating Nares Daily   docusate sodium  100 mg Oral BID   lidocaine  1-3 patch Transdermal Q24H   nystatin  5 mL Oral QID   [START ON 02/10/2022] pantoprazole  40 mg Intravenous Q12H   Continuous Infusions:  lactated ringers 75 mL/hr at 02/08/22 0504     LOS: 1 day   Time spent: 25 minutes   Noralee StainJennifer Colon Rueth, DO [facility] 02/08/2022, 12:23 PM   Available via Epic secure chat 7am-7pm After these hours, please refer to coverage provider listed on [email or web address]

## 2022-02-08 NOTE — Evaluation (Signed)
Occupational Therapy Evaluation Patient Details Name: Stephanie Collier MRN: [id number] DOB: Nov 27, 1935 Today's Date: 02/08/2022   History of Present Illness 87 y.o. female presents to [facility] hospital on 02/06/2022 with back pain. MRI 1/28 demonstrates acute/subacute L3/4 and T10 compression fxs, also with severe T5-7 compression fxs. PMH includes AAA, HTN, thoracic aortic aneurysm, COPD, raynaud's.   Clinical Impression   Patient admitted for the diagnosis above.  PTA she lives with her daughter, who can assist as needed.  Generally she is able to perform her own ADL and light iADL.  Back pain is the primary deficit.  Currently she is needing setup for grooming seated, Min A for bed mobility, and Mod A for lower body ADL.  Patient found long sitting in bed, stating she feels as though part of her lunch is stuck in her throat.  OT assisted with cough and a few sips of water without success, attempted sitting EOB and trying the above again.  Patient able to burp, stating she felt better.  RN notified, and patient encouraged OOB to the recliner for meals.  Patient is scheduled for spine surgery, so OT will follow for post op status, and assist with transition to the next level.        Recommendations for follow up therapy are one component of a multi-disciplinary discharge planning process, led by the attending physician.  Recommendations may be updated based on patient status, additional functional criteria and insurance authorization.   Follow Up Recommendations  No OT follow up     Assistance Recommended at Discharge Intermittent Supervision/Assistance  Patient can return home with the following Assist for transportation;Assistance with cooking/housework;A little help with walking and/or transfers;A little help with bathing/dressing/bathroom    Functional Status Assessment  Patient has had a recent decline in their functional status and demonstrates the ability to make significant improvements in  function in a reasonable and predictable amount of time.  Equipment Recommendations  None recommended by OT    Recommendations for Other Services       Precautions / Restrictions Precautions Precautions: Fall;Back Precaution Booklet Issued: No Precaution Comments: verbally reviewed back precautions for comfort Restrictions Weight Bearing Restrictions: No      Mobility Bed Mobility Overal bed mobility: Needs Assistance Bed Mobility: Sidelying to Sit, Sit to Sidelying   Sidelying to sit: Min assist, HOB elevated     Sit to sidelying: Min assist      Transfers                          Balance Overall balance assessment: Needs assistance Sitting-balance support: Single extremity supported, Feet unsupported Sitting balance-Leahy Scale: Good                                     ADL either performed or assessed with clinical judgement   ADL       Grooming: Wash/dry hands;Wash/dry face;Set up;Sitting           Upper Body Dressing : Minimal assistance;Sitting   Lower Body Dressing: Moderate assistance;Sitting/lateral leans                       Vision Patient Visual Report: No change from baseline       Perception     Praxis      Pertinent Vitals/Pain Pain Assessment Faces Pain Scale: Hurts little more Pain Location:  back Pain Descriptors / Indicators: Aching Pain Intervention(s): Monitored during session     Hand Dominance Right   Extremity/Trunk Assessment Upper Extremity Assessment Upper Extremity Assessment: Generalized weakness   Lower Extremity Assessment Lower Extremity Assessment: Defer to PT evaluation   Cervical / Trunk Assessment Cervical / Trunk Assessment: Kyphotic   Communication Communication Communication: No difficulties   Cognition Arousal/Alertness: Awake/alert Behavior During Therapy: Flat affect Overall Cognitive Status: Within Functional Limits for tasks assessed                                        General Comments  O2 sat 95% on RA with HR 62   Exercises     Shoulder Instructions      Home Living Family/patient expects to be discharged to:: Private residence Living Arrangements: Children Available Help at Discharge: Family;Available 24 hours/day Type of Home: House Home Access: Stairs to enter [employer] of Steps: 3 Entrance Stairs-Rails: Left Home Layout: One level     Bathroom Shower/Tub: Tub/shower unit;Walk-in shower   Bathroom Toilet: Standard Bathroom Accessibility: Yes How Accessible: Accessible via walker Home Equipment: [place] (2 wheels);Rollator (4 wheels);Cane - quad;Shower seat          Prior Functioning/Environment Prior Level of Function : Independent/Modified Independent;History of Falls (last six months)             Mobility Comments: ambulatory with Rollator outdoors, RW vs no device indoors, utilizes quad cane for stair negotiation ADLs Comments: mod independent for ADL and light home management at RW level        OT Problem List: Pain;Decreased activity tolerance;Impaired balance (sitting and/or standing)      OT Treatment/Interventions: Self-care/ADL training;Therapeutic activities;Patient/family education;Balance training;DME and/or AE instruction    OT Goals(Current goals can be found in the care plan section) Acute Rehab OT Goals Patient Stated Goal: Return home OT Goal Formulation: With patient Time For Goal Achievement: 02/19/22 Potential to Achieve Goals: Good ADL Goals Pt Will Perform Grooming: with modified independence;standing Pt Will Perform Lower Body Dressing: with modified independence;sit to/from stand Pt Will Transfer to Toilet: with modified independence;regular height toilet;ambulating  OT Frequency: Min 2X/week    Co-evaluation              AM-PAC OT "6 Clicks" Daily Activity     Outcome Measure Help from another person eating meals?: None Help from  another person taking care of personal grooming?: A Little Help from another person toileting, which includes using toliet, bedpan, or urinal?: A Little Help from another person bathing (including washing, rinsing, drying)?: A Lot Help from another person to put on and taking off regular upper body clothing?: A Little Help from another person to put on and taking off regular lower body clothing?: A Lot 6 Click Score: 17   End of Session Equipment Utilized During Treatment: Rolling walker (2 wheels);Oxygen Nurse Communication: Mobility status  Activity Tolerance: Patient limited by pain Patient left: in bed;with call bell/phone within reach  OT Visit Diagnosis: Unsteadiness on feet (R26.81)                Time: 1350-1411 OT Time Calculation (min): 21 min Charges:  OT General Charges $OT Visit: 1 Visit OT Evaluation $OT Eval Moderate Complexity: 1 Mod  02/08/2022  RP, OTR/L  Acute Rehabilitation Services  Office:  [phone]   Metta Clines 02/08/2022, 2:17 PM

## 2022-02-08 NOTE — ED Notes (Signed)
ED TO INPATIENT HANDOFF REPORT  ED Nurse Name and Phone #: Misty Stanley Name/Age/Gender [place] 87 y.o. female Room/Bed: 039C/039C  Code Status   Code Status: DNR  Home/SNF/Other Home Patient oriented to: self, place, time, and situation Is this baseline? Yes   Triage Complete: Triage complete  Chief Complaint Intractable back pain [M54.9]  Triage Note Pt has chronic pain d/t Osteoporosis in thoracic and lower back.  Pt states she has "compression fractures"  Since Tuesday she has not been able to use her walker or sit up d/t to increasing pain.  She had an appt with Ortho but could not go d/t pain. - Dr Rosanne Gutting.  Dr  Sherilyn Dacosta referred her to him.   Allergies Allergies  Allergen Reactions   Fentanyl Shortness Of Breath   Asa [Aspirin]     Per patient stomach cramps   Azithromycin     Other reaction(s): diarrhea   Fosamax [Alendronate]     Other reaction(s): indigestion   Labetalol Hcl     Other reaction(s): indigestion    Level of Care/Admitting Diagnosis ED Disposition     ED Disposition  Admit   Condition  --   Comment  Hospital Area: [facility] [[id number]]  Level of Care: Med-Surg [16]  May admit patient to [facility] or [facility] if equivalent level of care is available:: No  Covid Evaluation: Asymptomatic - no recent exposure (last 10 days) testing not required  Diagnosis: Intractable back pain [720110]  Admitting Physician: Jonah Blue [[id number]]  Attending Physician: Jonah Blue [[id number]]  Certification:: I certify this patient will need inpatient services for at least 2 midnights  Estimated Length of Stay: 4          B Medical/Surgery History Past Medical History:  Diagnosis Date   AAA (abdominal aortic aneurysm) (HCC)    Arthritis    hip   Hypertension    no meds now   Osteoporosis    Patellar fracture    left   Raynaud's disease    Scoliosis    Past Surgical History:  Procedure Laterality Date    APPENDECTOMY     at age 77   BREAST BIOPSY Left    CHEST TUBE INSERTION Left 04/04/2014   Procedure: CHEST TUBE INSERTION;  Surgeon: Nada Libman, MD;  Location: [facility] OR;  Service: Vascular;  Laterality: Left;  by Dr. Collier Bullock   INSERTION OF VENA CAVA FILTER N/A 04/02/2014   Procedure: INSERTION OF VENA CAVA FILTER;  Surgeon: Nada Libman, MD;  Location: [facility] CATH LAB;  Service: Cardiovascular;  Laterality: N/A;   INTRAMEDULLARY (IM) NAIL INTERTROCHANTERIC Left 06/04/2021   Procedure: INTRAMEDULLARY (IM) NAIL INTERTROCHANTRIC;  Surgeon: Ernestina Columbia, MD;  Location: [facility] OR;  Service: Orthopedics;  Laterality: Left;   ORIF PATELLA Left 11/20/2020   Procedure: OPEN REDUCTION INTERNAL (ORIF) FIXATION PATELLA;  Surgeon: Joen Laura, MD;  Location: [facility];  Service: Orthopedics;  Laterality: Left;   PERIPHERAL VASCULAR CATHETERIZATION N/A 06/26/2014   Procedure: IVC Filter Removal;  Surgeon: Nada Libman, MD;  Location: [facility] INVASIVE CV LAB;  Service: Cardiovascular;  Laterality: N/A;   THORACIC AORTIC ENDOVASCULAR STENT GRAFT N/A 04/04/2014   Procedure: THORACIC AORTIC ENDOVASCULAR STENT GRAFT;  Surgeon: Nada Libman, MD;  Location: [facility] OR;  Service: Vascular;  Laterality: N/A;     A IV Location/Drains/Wounds Patient Lines/Drains/Airways Status     Active Line/Drains/Airways     Name Placement date Placement time Site  Days   Peripheral IV 02/06/22 22 G 1" Right Antecubital 02/06/22  2134  Antecubital  2   Peripheral IV 02/07/22 20 G 1" Left Antecubital 02/07/22  0103  Antecubital  1   External Urinary Catheter 02/07/22  1603  --  1            Intake/Output Last 24 hours  Intake/Output Summary (Last 24 hours) at 02/08/2022 1121 Last data filed at 02/08/2022 1120 Gross per 24 hour  Intake --  Output 750 ml  Net -750 ml    Labs/Imaging Results for orders placed or performed during the hospital encounter of 02/06/22 (from the past 48 hour(s))  CBC      Status: None   Collection Time: 02/06/22  9:56 PM  Result Value Ref Range   WBC 6.3 4.0 - 10.5 K/uL   RBC 4.38 3.87 - 5.11 MIL/uL   Hemoglobin 13.7 12.0 - 15.0 g/dL   HCT 86.542.2 78.436.0 - 69.646.0 %   MCV 96.3 80.0 - 100.0 fL   MCH 31.3 26.0 - 34.0 pg   MCHC 32.5 30.0 - 36.0 g/dL   RDW 29.513.6 28.411.5 - 13.215.5 %   Platelets 170 150 - 400 K/uL   nRBC 0.0 0.0 - 0.2 %    Comment: Performed at [facility], [address] [place], [place] [address]  Comprehensive metabolic panel     Status: Abnormal   Collection Time: 02/06/22  9:56 PM  Result Value Ref Range   Sodium 136 135 - 145 mmol/L   Potassium 3.4 (L) 3.5 - 5.1 mmol/L   Chloride 99 98 - 111 mmol/L   CO2 27 22 - 32 mmol/L   Glucose, Bld 126 (H) 70 - 99 mg/dL    Comment: Glucose reference range applies only to samples taken after fasting for at least 8 hours.   BUN 18 8 - 23 mg/dL   Creatinine, Ser 2.720.59 0.44 - 1.00 mg/dL   Calcium 8.7 (L) 8.9 - 10.3 mg/dL   Total Protein 5.9 (L) 6.5 - 8.1 g/dL   Albumin 3.2 (L) 3.5 - 5.0 g/dL   AST 23 15 - 41 U/L   ALT 13 0 - 44 U/L   Alkaline Phosphatase 68 38 - 126 U/L   Total Bilirubin 1.5 (H) 0.3 - 1.2 mg/dL   GFR, Estimated >53>60 >66>60 mL/min    Comment: (NOTE) Calculated using the CKD-EPI Creatinine Equation (2021)    Anion gap 10 5 - 15    Comment: Performed at [facility], [address] [place], [place] [address]  Lipase, blood     Status: None   Collection Time: 02/06/22  9:56 PM  Result Value Ref Range   Lipase 33 11 - 51 U/L    Comment: Performed at [facility], [address] [place], [place] [address]  Resp panel by RT-PCR (RSV, Flu A&B, Covid) Urine, Clean Catch     Status: None   Collection Time: 02/06/22  9:59 PM   Specimen: Urine, Clean Catch; Nasal Swab  Result Value Ref Range   SARS Coronavirus 2 by RT PCR NEGATIVE NEGATIVE    Comment: (NOTE) SARS-CoV-2 target nucleic acids are NOT DETECTED.  The SARS-CoV-2 RNA is generally detectable in upper  respiratory specimens during the acute phase of infection. The lowest concentration of SARS-CoV-2 viral copies this assay can detect is 138 copies/mL. A negative result does not preclude SARS-Cov-2 infection and should not be used as the sole basis for  treatment or other patient management decisions. A negative result may occur with  improper specimen collection/handling, submission of specimen other than nasopharyngeal swab, presence of viral mutation(s) within the areas targeted by this assay, and inadequate number of viral copies(<138 copies/mL). A negative result must be combined with clinical observations, patient history, and epidemiological information. The expected result is Negative.  Fact Sheet for Patients:  BloggerCourse.com  Fact Sheet for Healthcare Providers:  SeriousBroker.it  This test is no t yet approved or cleared by the [place] FDA and  has been authorized for detection and/or diagnosis of SARS-CoV-2 by FDA under an Emergency Use Authorization (EUA). This EUA will remain  in effect (meaning this test can be used) for the duration of the COVID-19 declaration under Section 564(b)(1) of the Act, 21 U.S.C.section 360bbb-3(b)(1), unless the authorization is terminated  or revoked sooner.       Influenza A by PCR NEGATIVE NEGATIVE   Influenza B by PCR NEGATIVE NEGATIVE    Comment: (NOTE) The Xpert Xpress SARS-CoV-2/FLU/RSV plus assay is intended as an aid in the diagnosis of influenza from Nasopharyngeal swab specimens and should not be used as a sole basis for treatment. Nasal washings and aspirates are unacceptable for Xpert Xpress SARS-CoV-2/FLU/RSV testing.  Fact Sheet for Patients: BloggerCourse.com  Fact Sheet for Healthcare Providers: SeriousBroker.it  This test is not yet approved or cleared by the [place] FDA and has been authorized for  detection and/or diagnosis of SARS-CoV-2 by FDA under an Emergency Use Authorization (EUA). This EUA will remain in effect (meaning this test can be used) for the duration of the COVID-19 declaration under Section 564(b)(1) of the Act, 21 U.S.C. section 360bbb-3(b)(1), unless the authorization is terminated or revoked.     Resp Syncytial Virus by PCR NEGATIVE NEGATIVE    Comment: (NOTE) Fact Sheet for Patients: BloggerCourse.com  Fact Sheet for Healthcare Providers: SeriousBroker.it  This test is not yet approved or cleared by the [place] FDA and has been authorized for detection and/or diagnosis of SARS-CoV-2 by FDA under an Emergency Use Authorization (EUA). This EUA will remain in effect (meaning this test can be used) for the duration of the COVID-19 declaration under Section 564(b)(1) of the Act, 21 U.S.C. section 360bbb-3(b)(1), unless the authorization is terminated or revoked.  Performed at [facility], [address], [place], [place] [address]   I-stat chem 8, ED (not at [facility], [facility] or [facility])     Status: Abnormal   Collection Time: 02/06/22 10:34 PM  Result Value Ref Range   Sodium 138 135 - 145 mmol/L   Potassium 3.7 3.5 - 5.1 mmol/L   Chloride 97 (L) 98 - 111 mmol/L   BUN 22 8 - 23 mg/dL   Creatinine, Ser 7.35 0.44 - 1.00 mg/dL   Glucose, Bld 329 (H) 70 - 99 mg/dL    Comment: Glucose reference range applies only to samples taken after fasting for at least 8 hours.   Calcium, Ion 1.16 1.15 - 1.40 mmol/L   TCO2 30 22 - 32 mmol/L   Hemoglobin 13.3 12.0 - 15.0 g/dL   HCT 92.4 26.8 - 34.1 %  CBC     Status: Abnormal   Collection Time: 02/08/22  5:10 AM  Result Value Ref Range   WBC 5.7 4.0 - 10.5 K/uL   RBC 3.85 (L) 3.87 - 5.11 MIL/uL   Hemoglobin 12.1 12.0 - 15.0 g/dL   HCT 96.2 22.9 - 79.8 %   MCV 97.4 80.0 -  100.0 fL   MCH 31.4 26.0 - 34.0 pg   MCHC 32.3 30.0 - 36.0 g/dL   RDW 29.513.7 62.111.5 - 30.815.5 %    Platelets 149 (L) 150 - 400 K/uL   nRBC 0.0 0.0 - 0.2 %    Comment: Performed at [facility], [address] [place], [place] [address]  Basic metabolic panel     Status: Abnormal   Collection Time: 02/08/22  9:40 AM  Result Value Ref Range   Sodium 137 135 - 145 mmol/L   Potassium 4.1 3.5 - 5.1 mmol/L   Chloride 105 98 - 111 mmol/L   CO2 22 22 - 32 mmol/L   Glucose, Bld 87 70 - 99 mg/dL    Comment: Glucose reference range applies only to samples taken after fasting for at least 8 hours.   BUN 11 8 - 23 mg/dL   Creatinine, Ser 6.960.42 (L) 0.44 - 1.00 mg/dL   Calcium 8.5 (L) 8.9 - 10.3 mg/dL   GFR, Estimated >29>60 >52>60 mL/min    Comment: (NOTE) Calculated using the CKD-EPI Creatinine Equation (2021)    Anion gap 10 5 - 15    Comment: Performed at [facility], [address] [place], [place] [address]   MR LUMBAR SPINE WO CONTRAST  Result Date: 02/07/2022 CLINICAL DATA:  Low back pain.  Compression fractures EXAM: MRI LUMBAR SPINE WITHOUT CONTRAST TECHNIQUE: Multiplanar, multisequence MR imaging of the lumbar spine was performed. No intravenous contrast was administered. COMPARISON:  CT 02/07/2022, 12/29/2017 FINDINGS: Segmentation:  Standard. Alignment: Lumbar dextrocurvature. Grade 1 anterolisthesis of L5 on S1. Slight retrolisthesis at L1-L2. Vertebrae: Superior endplate compression fracture of the L3 vertebral body with approximately 40% vertebral body height loss. Bone marrow edema throughout the vertebral body compatible with an acute or subacute process. Minimal retropulsion at the superior endplate. No fracture extension into the posterior elements. Superior endplate compression fracture of the L4 vertebral body with approximately 45% vertebral body height loss. Minimal retropulsion at the superior endplate. Faint marrow edema favors a subacute process. The remaining lumbar vertebral body heights are maintained. No additional fractures. Hyperintense T2/STIR signal within the  L2-3 and L3-4 disc spaces are favored to be posttraumatic without secondary findings to suggest discitis. No suspicious marrow replacing bone lesion. Conus medullaris and cauda equina: Conus extends to the L1-2 level. Conus and cauda equina appear normal. Paraspinal and other soft tissues: Known infrarenal abdominal aortic aneurysm and bilateral iliac artery aneurysms, as characterized on same day dedicated CT angiogram. Disc levels: T12-L1: Mild disc bulge without foraminal or canal stenosis. L1-L2: Mild disc bulge and bilateral facet hypertrophy. Mild left foraminal stenosis. No canal stenosis. L2-L3: Mild disc bulge with L3 superior endplate retropulsion. Mild-to-moderate bilateral facet arthropathy. No significant foraminal or canal stenosis. L3-L4: Diffuse disc bulge with mild retropulsion of the L4 superior endplate. Moderate bilateral facet arthropathy. Left greater than right subarticular recess stenosis without canal stenosis. Moderate left and mild right foraminal stenosis. L4-L5: Mild disc bulge with shallow disc protrusion in the right subarticular zone. Moderate facet arthropathy. No foraminal or canal stenosis. L5-S1: Minimal disc bulge. Bilateral facet arthropathy. No foraminal or canal stenosis. IMPRESSION: 1. Acute-to-subacute superior endplate compression fractures of the L3 and L4 vertebral bodies with minimal retropulsion of both superior endplates. 2. Multilevel degenerative changes of the lumbar spine, as described above. No significant canal stenosis at any level. 3. Moderate left and mild right foraminal stenosis at L3-L4. 4. Known infrarenal abdominal aortic aneurysm and  bilateral iliac artery aneurysms, as characterized on same day dedicated CT angiogram. Electronically Signed   By: Duanne GuessNicholas  Plundo D.O.   On: 02/07/2022 09:29   MR THORACIC SPINE WO CONTRAST  Result Date: 02/07/2022 CLINICAL DATA:  87 year old female with mid back pain. T5 through T7 compression fractures demonstrated  on MRI earlier this month. New T10 vertebral body suspected on CT this morning, along with age indeterminate L3 and L4 compression fractures. EXAM: MRI THORACIC SPINE WITHOUT CONTRAST TECHNIQUE: Multiplanar, multisequence MR imaging of the thoracic spine was performed. No intravenous contrast was administered. COMPARISON:  CTA CT Chest, Abdomen, and Pelvis, CT thoracic and lumbar spine 0306 hours today. Recent thoracic MRI 01/14/2022.  Cervical spine CT 11/13/2020. FINDINGS: Limited cervical spine imaging:  Unremarkable for age. Thoracic spine segmentation: Normal by CT this morning, same numbering system used on 01/14/2022. Alignment: Midthoracic kyphosis does not appear significantly changed from earlier this month. Vertebrae: Severe T5, T6 and T7 compression fractures with near vertebra plana and ongoing marrow edema. Patchy new anterior superior endplate marrow edema at both the T4 inferior and T8 superior endplates on series 4, image 11. Vertebral body height at both levels appears maintained. T10 inferior endplate compression fracture with patchy new marrow edema and 25% loss of height since 01/14/2022. Mild retropulsion of the T10 posteroinferior endplate. T10 posterior elements appear intact. Other thoracic levels appears stable and intact. Lumbar vertebrae are reported separately today. Cord: Stable, normal. Capacious thoracic spinal canal at most levels. Conus medullaris is at T12-L1. Paraspinal and other soft tissues: Stable to the CT Chest, Abdomen, and Pelvis this morning. Disc levels: Unchanged thoracic spine degeneration which is generally mild for age. No spinal stenosis, even when considering the mild compression fracture related retropulsion. IMPRESSION: 1. Acute T10 compression fracture with 25% loss of vertebral body height is new since 01/14/2022. No significant retropulsion or other complicating features. 2. Severe T5 through T7 compression fractures (near vertebra plana) with ongoing marrow  edema. New anterior endplate marrow edema at the adjacent T4 and T8 levels is favored to be reactive, rather than due to impending collapse. 3. No thoracic spinal stenosis or spinal cord signal abnormality. 4. Lumbar MRI today reported separately. Electronically Signed   By: Odessa FlemingH  Hall M.D.   On: 02/07/2022 09:21   CT L-SPINE NO CHARGE  Result Date: 02/07/2022 CLINICAL DATA:  Back pain. EXAM: CT Thoracic and Lumbar spine without contrast TECHNIQUE: Multiplanar CT images of the thoracic and lumbar spine were reconstructed from contemporary CTA of the Chest, Abdomen, and Pelvis. RADIATION DOSE REDUCTION: This exam was performed according to the departmental dose-optimization program which includes automated exposure control, adjustment of the mA and/or kV according to patient size and/or use of iterative reconstruction technique. CONTRAST:  None or No additional COMPARISON:  Thoracic spine MRI recently 01/14/2022. CTA chest, abdomen and pelvis 12/29/2017. FINDINGS: CT THORACIC SPINE FINDINGS Alignment: There is a nearly 60 degrees kyphotic angle centered at C5-7, mild dextroscoliosis. This was seen previously. Vertebrae: Generalized osteopenia. Moderate to severe subacute compression fractures of T5, T6 and T7 are again noted, with slight retropulsion. There are mild but increased concavities of the upper and lower plates of the U0410 vertebral body consistent with a mild central compression fracture new from 01/14/2022. The central height loss no more than 20%, without significant anterior or posterior height loss. All pedicles and posterior elements are intact. No aggressive primary bone lesion is seen. Other thoracic vertebrae are preserved in height. There is thoracic spondylosis. Paraspinal and  other soft tissues: No paraspinal hematoma. Other soft tissue findings described separately. Disc levels: Disc space loss T5-8 appears similar with otherwise normal disc heights. Small disc protrusions are again noted and  T11-12 and T10-11 and mild disc bulging at T12-L1 and L1-2. There is mild bilateral foraminal stenosis at T7-8 due to the T7 compression fracture. Other foramina appear generally clear despite slight facet spurring at most levels. CT LUMBAR SPINE FINDINGS Segmentation: 5 lumbar type vertebrae. Alignment: Chronic mild-to-moderate dextro rotary scoliosis, apex L2-3. Vertebrae: Osteopenia. There are new age-indeterminate superior endplate compression fractures of L3 and L4, both with slight posterosuperior retropulsion, up to 2 mm retropulsion at L3 and after 3-4 mm retropulsion at L4. There is no bony fragmentation anteriorly in these may be subacute, but there is sclerosis still significant vertebral bodies consistent with a healing response. Loss of vertebral height at L3 is 40-50% anteriorly and posteriorly, 50-60% centrally. Loss of vertebral height at L4 is 50% anteriorly and centrally 30-40% posteriorly. Other vertebrae are normal in heights. No primary bone lesion is seen. Paraspinal and other soft tissues: Enlarging infrarenal AAA, described and reported separately. No paraspinal hematoma. Disc levels: There are normal lumbar disc heights from T12-L1 down. There are mild disc bulges T12-L1 and L1-2, with posterior disc osteophyte complexes L2-3 and L3-4 causing moderate spinal canal stenosis in combination with hypertrophic facet changes. There are mild nonstenosing disc bulges at L4-5 and L5-S1. Facet hypertrophy is seen at all levels but is greatest from L3-4 down. The foramina however, are not significantly stenotic. Other: All pedicles and posterior elements are intact. There is no displaced sacral insufficiency fracture. IMPRESSION: 1. Osteopenia and degenerative change. 2. Moderate to severe subacute compression fractures of T5, T6, and T7 with slight retropulsion. Unchanged from 01/14/2022 MRI. 3. Mild central compression fracture of the T10 vertebral body, new from 01/14/2022. 4. Age-indeterminate  superior endplate compression fractures of L3 and L4, with slight retropulsion at L3 and L4. The gross appearance suggests they are probably nonacute, most likely subacute or chronic. 5. Enlarging infrarenal AAA, described and reported separately. Electronically Signed   By: Almira Bar M.D.   On: 02/07/2022 05:38   CT T-SPINE NO CHARGE  Result Date: 02/07/2022 CLINICAL DATA:  Back pain. EXAM: CT Thoracic and Lumbar spine without contrast TECHNIQUE: Multiplanar CT images of the thoracic and lumbar spine were reconstructed from contemporary CTA of the Chest, Abdomen, and Pelvis. RADIATION DOSE REDUCTION: This exam was performed according to the departmental dose-optimization program which includes automated exposure control, adjustment of the mA and/or kV according to patient size and/or use of iterative reconstruction technique. CONTRAST:  None or No additional COMPARISON:  Thoracic spine MRI recently 01/14/2022. CTA chest, abdomen and pelvis 12/29/2017. FINDINGS: CT THORACIC SPINE FINDINGS Alignment: There is a nearly 60 degrees kyphotic angle centered at C5-7, mild dextroscoliosis. This was seen previously. Vertebrae: Generalized osteopenia. Moderate to severe subacute compression fractures of T5, T6 and T7 are again noted, with slight retropulsion. There are mild but increased concavities of the upper and lower plates of the M35 vertebral body consistent with a mild central compression fracture new from 01/14/2022. The central height loss no more than 20%, without significant anterior or posterior height loss. All pedicles and posterior elements are intact. No aggressive primary bone lesion is seen. Other thoracic vertebrae are preserved in height. There is thoracic spondylosis. Paraspinal and other soft tissues: No paraspinal hematoma. Other soft tissue findings described separately. Disc levels: Disc space loss T5-8 appears similar  with otherwise normal disc heights. Small disc protrusions are again  noted and T11-12 and T10-11 and mild disc bulging at T12-L1 and L1-2. There is mild bilateral foraminal stenosis at T7-8 due to the T7 compression fracture. Other foramina appear generally clear despite slight facet spurring at most levels. CT LUMBAR SPINE FINDINGS Segmentation: 5 lumbar type vertebrae. Alignment: Chronic mild-to-moderate dextro rotary scoliosis, apex L2-3. Vertebrae: Osteopenia. There are new age-indeterminate superior endplate compression fractures of L3 and L4, both with slight posterosuperior retropulsion, up to 2 mm retropulsion at L3 and after 3-4 mm retropulsion at L4. There is no bony fragmentation anteriorly in these may be subacute, but there is sclerosis still significant vertebral bodies consistent with a healing response. Loss of vertebral height at L3 is 40-50% anteriorly and posteriorly, 50-60% centrally. Loss of vertebral height at L4 is 50% anteriorly and centrally 30-40% posteriorly. Other vertebrae are normal in heights. No primary bone lesion is seen. Paraspinal and other soft tissues: Enlarging infrarenal AAA, described and reported separately. No paraspinal hematoma. Disc levels: There are normal lumbar disc heights from T12-L1 down. There are mild disc bulges T12-L1 and L1-2, with posterior disc osteophyte complexes L2-3 and L3-4 causing moderate spinal canal stenosis in combination with hypertrophic facet changes. There are mild nonstenosing disc bulges at L4-5 and L5-S1. Facet hypertrophy is seen at all levels but is greatest from L3-4 down. The foramina however, are not significantly stenotic. Other: All pedicles and posterior elements are intact. There is no displaced sacral insufficiency fracture. IMPRESSION: 1. Osteopenia and degenerative change. 2. Moderate to severe subacute compression fractures of T5, T6, and T7 with slight retropulsion. Unchanged from 01/14/2022 MRI. 3. Mild central compression fracture of the T10 vertebral body, new from 01/14/2022. 4.  Age-indeterminate superior endplate compression fractures of L3 and L4, with slight retropulsion at L3 and L4. The gross appearance suggests they are probably nonacute, most likely subacute or chronic. 5. Enlarging infrarenal AAA, described and reported separately. Electronically Signed   By: Telford Nab M.D.   On: 02/07/2022 05:38   CT Angio Chest/Abd/Pel for Dissection W and/or Wo Contrast  Result Date: 02/07/2022 CLINICAL DATA:  Acute aortic syndrome suspected. History of thoracic aortic stent graft and AAA. EXAM: CT ANGIOGRAPHY CHEST, ABDOMEN AND PELVIS TECHNIQUE: Axial chest CT without contrast was initially obtained. Multidetector CT imaging through the chest, abdomen and pelvis was performed using the standard protocol during bolus administration of intravenous contrast. Multiplanar reconstructed images and MIPs were obtained and reviewed to evaluate the vascular anatomy. RADIATION DOSE REDUCTION: This exam was performed according to the departmental dose-optimization program which includes automated exposure control, adjustment of the mA and/or kV according to patient size and/or use of iterative reconstruction technique. CONTRAST:  38mL OMNIPAQUE IOHEXOL 350 MG/ML SOLN COMPARISON:  CTA chest, abdomen and pelvis 12/29/2017. MRI thoracic spine 01/14/2022. Last chest film was portable 06/03/2021. FINDINGS: CTA CHEST FINDINGS Cardiovascular: There is moderate panchamber cardiomegaly with left chamber predominance. Three-vessel coronary artery calcifications greatest in the LAD. There are calcific plaques in the aorta and scattered calcifications in the great vessels. The great vessels show tortuosity without stenosis. There is a small increased pericardial effusion. The heart has increased in size since 2019. The superior pulmonary veins are prominent. The pulmonary trunk caliber, ectatic measuring 3.3 cm indicating arterial hypertension. There is no central embolus. Interval slight dilatation aortic  root at the sinuses of Valsalva where the diameter is 4.2 cm previously 4 cm. Stable ectasia in the ascending aorta, measuring 3.9  cm. The arch segment again measures 2.8 cm. A thoracic aortic stent graft again extends from 1.3 cm distal to the left subclavian takeoff down to T10-11. No focal recurrent aneurysm is seen but the descending segment remains ectatic at 3.5 cm. No acute stroke complication is seen, no aortic dissection, with moderate aortic tortuosity. A penetrating ulcer is again noted just beyond the distal graft landing zone along the left lateral wall where the aortic diameter is 3.2 cm, previously 3 cm. Mediastinum/Nodes: No enlarged mediastinal, hilar, or axillary lymph nodes. Thyroid gland is unremarkable. The trachea and central airways are prominent but clear. There is an increasingly patulous appearance and fluid filling in the upper half of the thoracic esophagus. No wall thickening or hiatal hernia. Lungs/Pleura: Diffuse bronchial thickening especially in the lower lobes. Mild chronic elevation right diaphragm. There is chronic change with subpleural reticulation, stable 4 mm right apical nodule on 8:20, stable 3 mm subpleural nodule laterally in the right upper lobe on 8:55. There is a new pleural-based squarish 6 mm noncalcified nodule in the posterior right apex on 8:20. There is increased medial left apical pleural-parenchymal scar-like opacity without a nodule like appearance. There are bilateral minimal pleural effusions not seen previously. No pneumothorax is seen or acute lung infiltrates or edema. Musculoskeletal: Osteopenia. There is thoracic kyphosis and again noted moderate to severe anterior wedge compression fractures of the T5-7 vertebrae, all having marrow edema on the recent MRI but without interval increasing retropulsion. The ribcage is intact. No chest wall masses. Review of the MIP images confirms the above findings. CTA ABDOMEN AND PELVIS FINDINGS VASCULAR Aorta: The  hiatal aortic segment unchanged, measuring 2.8 cm. There is moderate to heavy calcific wall plaque an enlarging infrarenal fusiform AAA now measuring 4.2 x 4.1 cm, was previously 3.3 x 3.3 cm with moderate thrombosis along its wall. No dissection. Celiac: Patent without evidence of aneurysm, dissection, vasculitis or significant stenosis. There are nonstenosing ostial calcifications. The vessel arises from the left lateral aortic wall SMA: Small amount of non stenosing mixed plaque. The vessel is patent without evidence of aneurysm, dissection, vasculitis or significant stenosis. It arises from the left anterolateral wall. Renals: Both are single. Again noted unchanged there is high-grade calcific origin stenosis on the left and no significant stenosis on the right. IMA: Small caliber but without focal stenosis, arises just above the infrarenal AAA. Inflow: Both common iliac arteries are heavily calcified. On the left there is diffuse increased aneurysmal dilatation and increased wall thrombus, maximum diameter now 2.7 cm, previously 2.3 cm. On the right there is diffuse common iliac aneurysmal dilatation, scattered wall thrombus and maximum diameter now 2.2 cm, previously 2.0 cm. No dissection or focal stenosis. The external iliac arteries are tortuous but are relatively clear. Both internal iliac arteries with patchy calcifications without stenosis with vessel tortuosity. 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 Veins: Unopacified and not evaluated Review of the MIP images confirms the above findings. NON-VASCULAR Hepatobiliary: No apparent masses seen. The gallbladder and bile ducts are unremarkable. Pancreas: No significant abnormality. 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 Spleen: No abnormality.  Heterogeneous enhancement consistent with bolus phase. Adrenals/Urinary Tract: Scar-like cortical defect posterior left upper pole, unchanged. No adrenal or renal mass enhancement. No urinary stone or  obstruction. No bladder thickening is seen. Stomach/Bowel: No dilatation or wall thickening. An appendix is not seen in this patient. There is colonic diverticulosis without evidence of focal diverticulitis. Lymphatic: No adenopathy is seen. Reproductive: Fibroid uterus.  No adnexal mass. Other: No free air, free hemorrhage, free fluid or incarcerated hernia. Musculoskeletal: Age-indeterminate moderate compression fractures of the L3 and 4 vertebral bodies with slight retropulsion new from 2019. There is dextro rotary scoliosis apex at L2-3. Prior left hip nailing. Review of the MIP images confirms the above findings. IMPRESSION: 1. No evidence of acute aortic syndrome. 2. Cardiomegaly with prominent superior pulmonary veins and minimal pleural effusions. No appreciable edema. 3. Chronic lung changes with a new 6 mm posterior right apical nodule. Per Fleischner Society Guidelines, recommend a non-contrast Chest CT at 6-12 months. If patient is high risk for malignancy, consider an additional non-contrast Chest CT at 18-24 months. If patient is low risk for malignancy, non-contrast Chest CT at 18-24 months is optional. These guidelines do not apply to immunocompromised patients and patients with cancer. Follow up in patients with significant comorbidities as clinically warranted. For lung cancer screening, adhere to Lung-RADS guidelines. Reference: Radiology. 2017; 284(1):228-43. 4. Increasingly patulous appearance and fluid filling the upper half of the thoracic esophagus. This could be due to reflux or dysmotility. No wall thickening or hiatal hernia. 5. Dilated aortic root at 4.2 cm, previously 4 cm. Recommend annual imaging followup by CTA or MRA. This recommendation follows 2010  ACCF/AHA/AATS/ACR/ASA/SCA/SCAI/SIR/STS/SVM Guidelines for the Diagnosis and Management of Patients with Thoracic Aortic Disease. Circulation. 2010; 121: W102-V253. Aortic aneurysm NOS (ICD10-I71.9). Stable ectasia in the ascending aorta, 3.9 cm. 6. 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 7. Increasing size of infrarenal fusiform AAA now 4.2 x 4.1 cm, previously 3.3 x 3.3 cm with moderate thrombosis along its wall. Vascular surgery referral recommended unless already none. 8. 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 9. 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 10. 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 11. Age-indeterminate but subacute moderate compression fractures of T5-7, all having marrow edema on the recent MRI. 12. Age-indeterminate moderate wedge compression fractures of L3 and L4, with slight retropulsion. 13. Remaining findings discussed above. Electronically Signed   By: Telford Nab M.D.   On: 02/07/2022 05:09    Pending Labs Unresulted Labs (From admission, onward)    None       Vitals/Pain Today's Vitals   02/08/22 0451 02/08/22 0503 02/08/22 0811 02/08/22 0908  BP: (!) 160/95   (!) 163/68  Pulse: 67   73  Resp: 18   18  Temp: 97.7 F (36.5 C)  97.6 F (36.4 C)   TempSrc: Oral  Oral   SpO2: 100%   93%  Weight:      Height:      PainSc:  0-No pain      Isolation Precautions No active isolations  Medications Medications  pantoprazole (PROTONIX) injection 40 mg (has no administration in time range)  lactated ringers infusion ( Intravenous New Bag/Given 02/08/22 0504)  acetaminophen (TYLENOL) tablet 650 mg (has no administration in time range)    Or  acetaminophen (TYLENOL) suppository 650 mg (has no administration in  time range)  oxyCODONE (Oxy IR/ROXICODONE) immediate release tablet 5  mg (5 mg Oral Given 02/08/22 0113)  morphine (PF) 2 MG/ML injection 2 mg (2 mg Intravenous Given 02/08/22 0236)  docusate sodium (COLACE) capsule 100 mg (100 mg Oral Given 02/08/22 1023)  polyethylene glycol (MIRALAX / GLYCOLAX) packet 17 g (has no administration in time range)  bisacodyl (DULCOLAX) EC tablet 5 mg (has no administration in time range)  ondansetron (ZOFRAN) tablet 4 mg ( Oral See Alternative 02/07/22 1244)    Or  ondansetron (ZOFRAN) injection 4 mg (4 mg Intravenous Given 02/07/22 1244)  hydrALAZINE (APRESOLINE) injection 5 mg (has no administration in time range)  calcitonin (salmon) (MIACALCIN/FORTICAL) nasal spray 1 spray (1 spray Alternating Nares Given 02/08/22 1024)  lidocaine (LIDODERM) 5 % 1-3 patch ( Transdermal Not Given 02/08/22 1024)  nystatin (MYCOSTATIN) 100000 UNIT/ML suspension 500,000 Units (500,000 Units Oral Given 02/08/22 1023)  morphine (PF) 4 MG/ML injection 4 mg (4 mg Intravenous Given 02/06/22 2219)  ondansetron (ZOFRAN) injection 4 mg (4 mg Intravenous Given 02/06/22 2219)  sodium chloride 0.9 % bolus 500 mL (0 mLs Intravenous Stopped 02/06/22 2250)  ondansetron (ZOFRAN) injection 4 mg (0 mg Intravenous Not Given 02/07/22 0016)  iohexol (OMNIPAQUE) 350 MG/ML injection 75 mL (75 mLs Intravenous Contrast Given 02/07/22 0310)  HYDROmorphone (DILAUDID) injection 0.5 mg (0.5 mg Intravenous Given 02/07/22 0703)    Mobility Patient has not ambulated with me, PT was able to get her to the side of the bed     Focused Assessments Ortho/pain   R Recommendations: See Admitting Provider Note  Report given to:   Additional Notes: Patient frustrated about being here, states we are dehydrating her, advised fluids are going, frustrated about her diet, declined her GI test this morning, she is able to verbalize her needs, purewick in place, c/o pain, then no pain, so I pulled pain meds, she declined them,  and lidocaine patch

## 2022-02-09 DIAGNOSIS — M549 Dorsalgia, unspecified: Secondary | ICD-10-CM | POA: Diagnosis not present

## 2022-02-09 MED ORDER — ENSURE ENLIVE PO LIQD
237.0000 mL | Freq: Two times a day (BID) | ORAL | Status: DC
  Administered 2022-02-09: 237 mL via ORAL

## 2022-02-09 MED ORDER — POLYETHYLENE GLYCOL 3350 17 G PO PACK
17.0000 g | PACK | Freq: Every day | ORAL | Status: DC
  Filled 2022-02-09: qty 1

## 2022-02-09 NOTE — Progress Notes (Signed)
Patient refused SCD.   Incentive spirometry introduced with teach back. Patient satisfied

## 2022-02-09 NOTE — Progress Notes (Signed)
Brief IR note:  IR consulted for kyphoplasty.  Procedure received authorization from insurance this afternoon.  Will make patient NPO at midnight in hopes of being able to provide KP to patient tomorrow, should this be desirable for her and feasible with scheduling.  IR to officially  consult with her tomorrow.  Electronically Signed: Pasty Spillers, PA-C 02/09/2022, 4:26 PM

## 2022-02-09 NOTE — Progress Notes (Signed)
Initial Nutrition Assessment  DOCUMENTATION CODES:   Severe malnutrition in context of chronic illness  INTERVENTION:   Ensure Enlive po BID, each supplement provides 350 kcal and 20 grams of protein.  Encourage PO intake, explained menu ordering process; change to room service with assist.    NUTRITION DIAGNOSIS:   Severe Malnutrition related to chronic illness (freq falls/COPD/back fractures) as evidenced by severe fat depletion, severe muscle depletion.  GOAL:   Patient will meet greater than or equal to 90% of their needs  MONITOR:   PO intake, Supplement acceptance  REASON FOR ASSESSMENT:   Consult Assessment of nutrition requirement/status  ASSESSMENT:   Pt with PMH of AAA, HTN, thoracic aortic aneurysm, COPD, and Raynaud's admitted with intractable back pain secondary to multiple compression fxs. Plan for kyphoplasty.   Pt has complained of food getting stuck but eventually moves through. Per MD pt has declined esophagogram.  Pt reports weight loss starting back during the pandemic due to falls that would set her back. She was 126 lb in 2020. Pt currently 96 lb.  Pt reports poor appetite PTA but is improving now. Pt lives with a daughter who works from home.   Medications reviewed and include: colace, protonix, miralax   Labs reviewed   NUTRITION - FOCUSED PHYSICAL EXAM:  Flowsheet Row Most Recent Value  Orbital Region Severe depletion  Upper Arm Region Severe depletion  Thoracic and Lumbar Region Severe depletion  Buccal Region Severe depletion  Temple Region Severe depletion  Clavicle Bone Region Severe depletion  Clavicle and Acromion Bone Region Severe depletion  Scapular Bone Region Severe depletion  Dorsal Hand Severe depletion  Patellar Region Severe depletion  Anterior Thigh Region Severe depletion  Posterior Calf Region Severe depletion  Edema (RD Assessment) None  Hair Reviewed  Eyes Reviewed  Mouth Reviewed  Skin Reviewed  Nails Reviewed        Diet Order:   Diet Order             DIET DYS 3 Room service appropriate? Yes; Fluid consistency: Thin  Diet effective now                   EDUCATION NEEDS:   Education needs have been addressed  Skin:  Skin Assessment: Reviewed RN Assessment  Last BM:  1/30  Height:   Ht Readings from Last 1 Encounters:  02/06/22 5' (1.524 m)    Weight:   Wt Readings from Last 1 Encounters:  02/06/22 44 kg   BMI:  Body mass index is 18.94 kg/m.  Estimated Nutritional Needs:   Kcal:  1350-1550  Protein:  65-75 grams  Fluid:  >1.5 L/day  Lockie Pares., RD, LDN, CNSC See AMiON for contact information

## 2022-02-09 NOTE — Progress Notes (Signed)
PROGRESS NOTE    Stephanie Collier  GLO:[id number] DOB: 03-30-1935 DOA: 02/06/2022 PCP: Leeroy Cha    Brief Narrative:  87 year old with history of hypertension, thoracic aortic aneurysm, COPD and Raynaud's admitted with intractable back pain.  She was found to have multilevel vertebral fractures, she has been less mobile and difficulty to ambulate so brought to the ER.  Admitted for pain management and possible vertebroplasty.   Assessment & Plan:   Intractable back pain secondary to multiple compression fractures: MRI revealed acute to subacute L3/4 and T10 compression fractures, T5-7 compression fractures. Multimodal pain management with oral Tylenol, oxycodone, morphine, lidocaine patches.  Aggressive bowel regimen with a stool softener and daily laxative. IR consulted, planning for kyphoplasty after insurance approval. Continue to work with PT OT, refer to SNF for rehab. On calcitonin for osteoporosis prophylaxis.  Dysphagia: On dysphagia 3 diet.  Declined esophagogram.  Vascular issues and thoracic aneurysm: Stable.  Outpatient follow-up.     DVT prophylaxis: SCDs Start: 02/07/22 1035, declines.   Code Status: DNR Family Communication: daughter called , unable to pick up  Disposition Plan: Status is: Inpatient Remains inpatient appropriate because: Significant back pain, mobility issues, inpatient procedures planned.     Consultants:  Interventional radiology  Procedures:  None  Antimicrobials:  None   Subjective: Patient seen and examined.  No overnight events.  At rest, she feels fairly comfortable.  It hurts on her lower back on any attempted mobility.  She has questions about kyphoplasty.  Discussed with radiology and they are waiting for insurance approval.  In the meantime, patient is reluctant to use any opiates.  I advised scheduled doses of laxatives and use some opiates to improve mobility and to improve rehab experience.  Objective: Vitals:    02/08/22 1539 02/08/22 2212 02/09/22 0332 02/09/22 0806  BP: (!) 153/70 (!) 159/76 (!) 166/82 (!) 168/77  Pulse: 97 80 80 80  Resp: 18   16  Temp: 97.6 F (36.4 C) 98.2 F (36.8 C) 98.1 F (36.7 C) 98.1 F (36.7 C)  TempSrc: Oral Oral Oral Oral  SpO2: 93% 93% 94% 99%  Weight:      Height:        Intake/Output Summary (Last 24 hours) at 02/09/2022 1415 Last data filed at 02/09/2022 0345 Gross per 24 hour  Intake --  Output 300 ml  Net -300 ml   Filed Weights   02/06/22 2111  Weight: 44 kg    Examination:  General exam: Appears calm and comfortable  Frail and debilitated. Respiratory system: No added sounds. Cardiovascular system: S1 & S2 heard, RRR. No pedal edema. Gastrointestinal system: Abdomen is nondistended, soft and nontender. No organomegaly or masses felt. Normal bowel sounds heard. Central nervous system: Alert and oriented. No focal neurological deficits.  Grossly weak.    Data Reviewed: I have personally reviewed following labs and imaging studies  CBC: Recent Labs  Lab 02/06/22 2156 02/06/22 2234 02/08/22 0510  WBC 6.3  --  5.7  HGB 13.7 13.3 12.1  HCT 42.2 39.0 37.5  MCV 96.3  --  97.4  PLT 170  --  188*   Basic Metabolic Panel: Recent Labs  Lab 02/06/22 2156 02/06/22 2234 02/08/22 0940  NA 136 138 137  K 3.4* 3.7 4.1  CL 99 97* 105  CO2 27  --  22  GLUCOSE 126* 126* 87  BUN 18 22 11   CREATININE 0.59 0.50 0.42*  CALCIUM 8.7*  --  8.5*   GFR: Estimated Creatinine Clearance:  36.4 mL/min (A) (by C-G formula based on SCr of 0.42 mg/dL (L)). Liver Function Tests: Recent Labs  Lab 02/06/22 2156  AST 23  ALT 13  ALKPHOS 68  BILITOT 1.5*  PROT 5.9*  ALBUMIN 3.2*   Recent Labs  Lab 02/06/22 2156  LIPASE 33   No results for input(s): "AMMONIA" in the last 168 hours. Coagulation Profile: No results for input(s): "INR", "PROTIME" in the last 168 hours. Cardiac Enzymes: No results for input(s): "CKTOTAL", "CKMB", "CKMBINDEX",  "TROPONINI" in the last 168 hours. BNP (last 3 results) No results for input(s): "PROBNP" in the last 8760 hours. HbA1C: No results for input(s): "HGBA1C" in the last 72 hours. CBG: No results for input(s): "GLUCAP" in the last 168 hours. Lipid Profile: No results for input(s): "CHOL", "HDL", "LDLCALC", "TRIG", "CHOLHDL", "LDLDIRECT" in the last 72 hours. Thyroid Function Tests: No results for input(s): "TSH", "T4TOTAL", "FREET4", "T3FREE", "THYROIDAB" in the last 72 hours. Anemia Panel: No results for input(s): "VITAMINB12", "FOLATE", "FERRITIN", "TIBC", "IRON", "RETICCTPCT" in the last 72 hours. Sepsis Labs: No results for input(s): "PROCALCITON", "LATICACIDVEN" in the last 168 hours.  Recent Results (from the past 240 hour(s))  Resp panel by RT-PCR (RSV, Flu A&B, Covid) Urine, Clean Catch     Status: None   Collection Time: 02/06/22  9:59 PM   Specimen: Urine, Clean Catch; Nasal Swab  Result Value Ref Range Status   SARS Coronavirus 2 by RT PCR NEGATIVE NEGATIVE Final    Comment: (NOTE) SARS-CoV-2 target nucleic acids are NOT DETECTED.  The SARS-CoV-2 RNA is generally detectable in upper respiratory specimens during the acute phase of infection. The lowest concentration of SARS-CoV-2 viral copies this assay can detect is 138 copies/mL. A negative result does not preclude SARS-Cov-2 infection and should not be used as the sole basis for treatment or other patient management decisions. A negative result may occur with  improper specimen collection/handling, submission of specimen other than nasopharyngeal swab, presence of viral mutation(s) within the areas targeted by this assay, and inadequate number of viral copies(<138 copies/mL). A negative result must be combined with clinical observations, patient history, and epidemiological information. The expected result is Negative.  Fact Sheet for Patients:  EntrepreneurPulse.com.au  Fact Sheet for Healthcare  Providers:  IncredibleEmployment.be  This test is no t yet approved or cleared by the [place] FDA and  has been authorized for detection and/or diagnosis of SARS-CoV-2 by FDA under an Emergency Use Authorization (EUA). This EUA will remain  in effect (meaning this test can be used) for the duration of the COVID-19 declaration under Section 564(b)(1) of the Act, 21 U.S.C.section 360bbb-3(b)(1), unless the authorization is terminated  or revoked sooner.       Influenza A by PCR NEGATIVE NEGATIVE Final   Influenza B by PCR NEGATIVE NEGATIVE Final    Comment: (NOTE) The Xpert Xpress SARS-CoV-2/FLU/RSV plus assay is intended as an aid in the diagnosis of influenza from Nasopharyngeal swab specimens and should not be used as a sole basis for treatment. Nasal washings and aspirates are unacceptable for Xpert Xpress SARS-CoV-2/FLU/RSV testing.  Fact Sheet for Patients: EntrepreneurPulse.com.au  Fact Sheet for Healthcare Providers: IncredibleEmployment.be  This test is not yet approved or cleared by the [place] FDA and has been authorized for detection and/or diagnosis of SARS-CoV-2 by FDA under an Emergency Use Authorization (EUA). This EUA will remain in effect (meaning this test can be used) for the duration of the COVID-19 declaration under Section 564(b)(1) of the Act, 21  U.S.C. section 360bbb-3(b)(1), unless the authorization is terminated or revoked.     Resp Syncytial Virus by PCR NEGATIVE NEGATIVE Final    Comment: (NOTE) Fact Sheet for Patients: EntrepreneurPulse.com.au  Fact Sheet for Healthcare Providers: IncredibleEmployment.be  This test is not yet approved or cleared by the [place] FDA and has been authorized for detection and/or diagnosis of SARS-CoV-2 by FDA under an Emergency Use Authorization (EUA). This EUA will remain in effect (meaning this test can be  used) for the duration of the COVID-19 declaration under Section 564(b)(1) of the Act, 21 U.S.C. section 360bbb-3(b)(1), unless the authorization is terminated or revoked.  Performed at [facility], [place] [address], [place], [place] [address]          Radiology Studies: No results found.      Scheduled Meds:  calcitonin (salmon)  1 spray Alternating Nares Daily   docusate sodium  100 mg Oral BID   feeding supplement  237 mL Oral BID BM   lidocaine  1-3 patch Transdermal Q24H   nystatin  5 mL Oral QID   pantoprazole  40 mg Oral BID AC   polyethylene glycol  17 g Oral Daily   Continuous Infusions:   LOS: 2 days    Time spent: 35 minutes.    Barb Merino, MD [facility] Pager [phone]

## 2022-02-09 NOTE — NC FL2 (Signed)
[place] LEVEL OF CARE FORM     IDENTIFICATION  Patient Name: Stephanie Collier Birthdate: 08-Apr-1935 Sex: female Admission Date (Current Location): 02/06/2022  [facility] and [place] Number:  Herbalist and Address:  The [place], [place] [address], [place], [place] [address]      Provider Number: [id number]  Attending Physician Name and Address:  Barb Merino, MD  Relative Name and Phone Number:       Current Level of Care: SNF Recommended Level of Care: [place] Prior Approval Number:    Date Approved/Denied:   PASRR Number: [id number]  Discharge Plan: SNF    Current Diagnoses: Patient Active Problem List   Diagnosis Date Noted   Intractable back pain 02/07/2022   Compression fracture of body of thoracic vertebra ([place]) 02/07/2022   Lumbar compression fracture, closed, initial encounter ([place]) 02/07/2022   PVD (peripheral vascular disease) ([place]) 02/07/2022   Dysphagia 02/07/2022   Multiple lung nodules 02/07/2022   DNR (do not resuscitate) 02/07/2022   Protein-calorie malnutrition, severe 06/06/2021   Cardiac murmur 06/04/2021   Fall at home, initial encounter 06/04/2021   Normocytic anemia 06/04/2021   Closed left hip fracture ([place]) 06/03/2021   Prolonged QT interval 06/03/2021   Family history of breast cancer 11/13/2018   Malnutrition of moderate degree ([place]) 04/09/2014   Aortic aneurysm, thoracic ([place]) 04/04/2014   Acute bronchitis 03/31/2014   HTN (hypertension) 03/31/2014   Descending thoracic aortic dissection (HCC)    UTI (lower urinary tract infection)     Orientation RESPIRATION BLADDER Height & Weight     Self, Time, Situation, Place  Normal Continent, External catheter Weight: 97 lb (44 kg) Height:  5' (152.4 cm)  BEHAVIORAL SYMPTOMS/MOOD NEUROLOGICAL BOWEL NUTRITION STATUS      Continent Diet  AMBULATORY STATUS COMMUNICATION OF NEEDS Skin   Extensive Assist Verbally Normal                        Personal Care Assistance Level of Assistance  Bathing, Dressing, Feeding Bathing Assistance: Limited assistance Feeding assistance: Limited assistance Dressing Assistance: Limited assistance     Functional Limitations Info  Sight, Hearing, Speech Sight Info: Adequate Hearing Info: Adequate Speech Info: Adequate    SPECIAL CARE FACTORS FREQUENCY  PT (By licensed PT), OT (By licensed OT)     PT Frequency: 5x weekly OT Frequency: 5x weekly            Contractures Contractures Info: Not present    Additional Factors Info  Code Status, Allergies Code Status Info: DNR Allergies Info: Fentanyl  Asa (Aspirin)  Azithromycin  Fosamax (Alendronate)  Labetalol Hcl           Current Medications (02/09/2022):  This is the current hospital active medication list Current Facility-Administered Medications  Medication Dose Route Frequency Provider Last Rate Last Admin   acetaminophen (TYLENOL) tablet 650 mg  650 mg Oral Q6H PRN Karmen Bongo, MD       Or   acetaminophen (TYLENOL) suppository 650 mg  650 mg Rectal Q6H PRN Karmen Bongo, MD       bisacodyl (DULCOLAX) EC tablet 5 mg  5 mg Oral Daily PRN Karmen Bongo, MD       calcitonin (salmon) (MIACALCIN/FORTICAL) nasal spray 1 spray  1 spray Alternating Nares Daily Karmen Bongo, MD   1 spray at 02/09/22 1116   docusate sodium (COLACE) capsule 100 mg  100 mg Oral BID Karmen Bongo,  MD   100 mg at 02/09/22 1058   feeding supplement (ENSURE ENLIVE / ENSURE PLUS) liquid 237 mL  237 mL Oral BID BM Ghimire, Dante Gang, MD       hydrALAZINE (APRESOLINE) injection 5 mg  5 mg Intravenous Q4H PRN Karmen Bongo, MD       lactated ringers infusion   Intravenous Continuous Karmen Bongo, MD 75 mL/hr at 02/08/22 1557 New Bag at 02/08/22 1557   lidocaine (LIDODERM) 5 % 1-3 patch  1-3 patch Transdermal Q24H Karmen Bongo, MD   1 patch at 02/09/22 1059   morphine (PF) 2 MG/ML injection 2 mg  2 mg Intravenous Q2H PRN Karmen Bongo, MD   2 mg at 02/08/22 0236   nystatin (MYCOSTATIN) 100000 UNIT/ML suspension 500,000 Units  5 mL Oral QID Karmen Bongo, MD   500,000 Units at 02/08/22 2206   ondansetron (ZOFRAN) tablet 4 mg  4 mg Oral Q6H PRN Karmen Bongo, MD       Or   ondansetron [facility]) injection 4 mg  4 mg Intravenous Q6H PRN Karmen Bongo, MD   4 mg at 02/07/22 1244   oxyCODONE (Oxy IR/ROXICODONE) immediate release tablet 5 mg  5 mg Oral Q4H PRN Karmen Bongo, MD   5 mg at 02/08/22 0113   pantoprazole (PROTONIX) EC tablet 40 mg  40 mg Oral BID AC Dessa Phi, DO   40 mg at 02/09/22 6599   polyethylene glycol (MIRALAX / GLYCOLAX) packet 17 g  17 g Oral Daily Barb Merino, MD         Discharge Medications: Please see discharge summary for a list of discharge medications.  Relevant Imaging Results:  Relevant Lab Results:   Additional Information SSN: [id number]  Archie Endo, LCSW

## 2022-02-10 ENCOUNTER — Inpatient Hospital Stay (HOSPITAL_COMMUNITY): Payer: Medicare Other

## 2022-02-10 DIAGNOSIS — M549 Dorsalgia, unspecified: Secondary | ICD-10-CM | POA: Diagnosis not present

## 2022-02-10 HISTORY — PX: IR KYPHO LUMBAR INC FX REDUCE BONE BX UNI/BIL CANNULATION INC/IMAGING: IMG5519

## 2022-02-10 HISTORY — PX: IR KYPHO EA ADDL LEVEL THORACIC OR LUMBAR: IMG5520

## 2022-02-10 LAB — CBC WITH DIFFERENTIAL/PLATELET
Abs Immature Granulocytes: 0.04 10*3/uL (ref 0.00–0.07)
Basophils Absolute: 0.1 10*3/uL (ref 0.0–0.1)
Basophils Relative: 1 %
Eosinophils Absolute: 0.2 10*3/uL (ref 0.0–0.5)
Eosinophils Relative: 4 %
HCT: 38.1 % (ref 36.0–46.0)
Hemoglobin: 12.1 g/dL (ref 12.0–15.0)
Immature Granulocytes: 1 %
Lymphocytes Relative: 18 %
Lymphs Abs: 1.1 10*3/uL (ref 0.7–4.0)
MCH: 30.9 pg (ref 26.0–34.0)
MCHC: 31.8 g/dL (ref 30.0–36.0)
MCV: 97.4 fL (ref 80.0–100.0)
Monocytes Absolute: 0.6 10*3/uL (ref 0.1–1.0)
Monocytes Relative: 10 %
Neutro Abs: 4.1 10*3/uL (ref 1.7–7.7)
Neutrophils Relative %: 66 %
Platelets: 167 10*3/uL (ref 150–400)
RBC: 3.91 MIL/uL (ref 3.87–5.11)
RDW: 13.8 % (ref 11.5–15.5)
WBC: 6.1 10*3/uL (ref 4.0–10.5)
nRBC: 0 % (ref 0.0–0.2)

## 2022-02-10 LAB — BASIC METABOLIC PANEL
Anion gap: 9 (ref 5–15)
BUN: 15 mg/dL (ref 8–23)
CO2: 29 mmol/L (ref 22–32)
Calcium: 8.4 mg/dL — ABNORMAL LOW (ref 8.9–10.3)
Chloride: 102 mmol/L (ref 98–111)
Creatinine, Ser: 0.47 mg/dL (ref 0.44–1.00)
GFR, Estimated: >60 mL/min (ref >60)
Glucose, Bld: 99 mg/dL (ref 70–99)
Potassium: 3.8 mmol/L (ref 3.5–5.1)
Sodium: 140 mmol/L (ref 135–145)

## 2022-02-10 LAB — PROTIME-INR
INR: 1.2 (ref 0.8–1.2)
Prothrombin Time: 14.8 seconds (ref 11.4–15.2)

## 2022-02-10 MED ORDER — TOBRAMYCIN SULFATE 1.2 G IJ SOLR
INTRAMUSCULAR | Status: AC
  Filled 2022-02-10: qty 1.2

## 2022-02-10 MED ORDER — ONDANSETRON HCL 4 MG/2ML IJ SOLN
INTRAMUSCULAR | Status: AC
  Filled 2022-02-10: qty 2

## 2022-02-10 MED ORDER — MIDAZOLAM HCL 2 MG/2ML IJ SOLN
INTRAMUSCULAR | Status: AC
  Filled 2022-02-10: qty 2

## 2022-02-10 MED ORDER — MIDAZOLAM HCL 2 MG/2ML IJ SOLN
INTRAMUSCULAR | Status: AC | PRN
  Administered 2022-02-10 (×2): .5 mg via INTRAVENOUS

## 2022-02-10 MED ORDER — CEFAZOLIN SODIUM-DEXTROSE 1-4 GM/50ML-% IV SOLN: 1.0000 g | INTRAVENOUS | Status: AC

## 2022-02-10 MED ORDER — ONDANSETRON HCL 4 MG/2ML IJ SOLN
INTRAMUSCULAR | Status: AC | PRN
  Administered 2022-02-10: 4 mg via INTRAVENOUS

## 2022-02-10 MED ORDER — BUPIVACAINE HCL (PF) 0.5 % IJ SOLN
INTRAMUSCULAR | Status: AC
  Administered 2022-02-10: 15 mL
  Filled 2022-02-10: qty 30

## 2022-02-10 MED ORDER — HYDROMORPHONE HCL 1 MG/ML IJ SOLN
INTRAMUSCULAR | Status: AC
  Filled 2022-02-10: qty 1

## 2022-02-10 MED ORDER — HYDROMORPHONE HCL 1 MG/ML IJ SOLN
INTRAMUSCULAR | Status: AC | PRN
  Administered 2022-02-10 (×2): .5 mg via INTRAVENOUS

## 2022-02-10 MED ORDER — SODIUM CHLORIDE 0.9 % IV SOLN: INTRAVENOUS | Status: AC

## 2022-02-10 NOTE — Procedures (Signed)
INR.  Status post L3 and L4 balloon kyphoplasty's with unit pedicular approach.  Biopsies obtained and sent for analysis as per requesting MD. Patient tolerated the procedure well.  There were no acute complications.  Arlean Hopping MD.

## 2022-02-10 NOTE — Consult Note (Signed)
Chief Complaint: Back pain  Referring Physician(s): Dorcas Carrow  Supervising Physician: Julieanne Cotton  Patient Status: [facility] - In-pt  History of Present Illness: Stephanie Collier is a 87 y.o. female with medical issues including osteoporosis, AAA, HTN, thoracic aortic aneurysm (stented), COPD, and Raynaud's presenting with back pain.    She was again recovering ok until she began having back pain.    She has been seeing orthopedics (Dr. Sherilyn Dacosta) and was referred to Dr. Yevette Edwards for possible kyphoplasty but began having worsening back pain so she came to the ED.  She has been increasingly less mobile and has been unable to get out of bed due to her pain.   MRI showed = Acute-to-subacute superior endplate compression fractures of the L3 and L4 vertebral bodies with minimal retropulsion of both superior endplates.    We are asked to evaluate her for kyphoplasty.  Past Medical History:  Diagnosis Date   AAA (abdominal aortic aneurysm) (HCC)    Arthritis    hip   Hypertension    no meds now   Osteoporosis    Patellar fracture    left   Raynaud's disease    Scoliosis     Past Surgical History:  Procedure Laterality Date   APPENDECTOMY     at age 37   BREAST BIOPSY Left    CHEST TUBE INSERTION Left 04/04/2014   Procedure: CHEST TUBE INSERTION;  Surgeon: Nada Libman, MD;  Location: [facility] OR;  Service: Vascular;  Laterality: Left;  by Dr. Collier Bullock   INSERTION OF VENA CAVA FILTER N/A 04/02/2014   Procedure: INSERTION OF VENA CAVA FILTER;  Surgeon: Nada Libman, MD;  Location: [facility] CATH LAB;  Service: Cardiovascular;  Laterality: N/A;   INTRAMEDULLARY (IM) NAIL INTERTROCHANTERIC Left 06/04/2021   Procedure: INTRAMEDULLARY (IM) NAIL INTERTROCHANTRIC;  Surgeon: Ernestina Columbia, MD;  Location: [facility] OR;  Service: Orthopedics;  Laterality: Left;   ORIF PATELLA Left 11/20/2020   Procedure: OPEN REDUCTION INTERNAL (ORIF) FIXATION PATELLA;  Surgeon: Joen Laura, MD;   Location: [facility];  Service: Orthopedics;  Laterality: Left;   PERIPHERAL VASCULAR CATHETERIZATION N/A 06/26/2014   Procedure: IVC Filter Removal;  Surgeon: Nada Libman, MD;  Location: [facility] INVASIVE CV LAB;  Service: Cardiovascular;  Laterality: N/A;   THORACIC AORTIC ENDOVASCULAR STENT GRAFT N/A 04/04/2014   Procedure: THORACIC AORTIC ENDOVASCULAR STENT GRAFT;  Surgeon: Nada Libman, MD;  Location: [facility] OR;  Service: Vascular;  Laterality: N/A;    Allergies: Fentanyl, Asa [aspirin], Azithromycin, Fosamax [alendronate], and Labetalol hcl  Medications: Prior to Admission medications   Medication Sig Start Date End Date Taking? Authorizing Provider  enoxaparin (LOVENOX) 40 MG/0.4ML injection Inject 0.4 mLs (40 mg total) into the skin daily. Patient not taking: Reported on 02/08/2022 06/05/21 07/17/21  Ernestina Columbia, MD     Family History  Problem Relation Age of Onset   Breast cancer Daughter 63    Social History   Socioeconomic History   Marital status: Divorced    Spouse name: Not on file   Number of children: Not on file   Years of education: Not on file   Highest education level: Not on file  Occupational History   Not on file  Tobacco Use   Smoking status: Never   Smokeless tobacco: Never  Vaping Use   Vaping Use: Never used  Substance and Sexual Activity   Alcohol use: Not Currently    Comment: rare wine   Drug use: No  Sexual activity: Not Currently    Birth control/protection: Post-menopausal  Other Topics Concern   Not on file  Social History Narrative   Not on file   Social Determinants of Health   Financial Resource Strain: Not on file  Food Insecurity: No Food Insecurity (02/08/2022)   Hunger Vital Sign    Worried About Running Out of Food in the Last Year: Never true    Ran Out of Food in the Last Year: Never true  Transportation Needs: No Transportation Needs (02/08/2022)   PRAPARE - Administrator, Civil Service  (Medical): No    Lack of Transportation (Non-Medical): No  Physical Activity: Not on file  Stress: Not on file  Social Connections: Not on file     Review of Systems: A 12 point ROS discussed and pertinent positives are indicated.  All other systems are negative.  Review of Systems  Vital Signs: BP (!) 156/69 (BP Location: Left Arm)   Pulse 76   Temp 97.7 F (36.5 C) (Oral)   Resp 16   Ht 5' (1.524 m)   Wt 97 lb (44 kg)   SpO2 100%   BMI 18.94 kg/m   Physical Exam Vitals reviewed.  Constitutional:      Comments: Thin, frail appearing  HENT:     Head: Normocephalic and atraumatic.  Eyes:     Extraocular Movements: Extraocular movements intact.  Cardiovascular:     Rate and Rhythm: Normal rate and regular rhythm.  Pulmonary:     Effort: Pulmonary effort is normal. No respiratory distress.     Breath sounds: Normal breath sounds.  Abdominal:     Palpations: Abdomen is soft.  Musculoskeletal:        General: Normal range of motion.     Cervical back: Normal range of motion.       Back:     Comments: Tender to palpation. Severe pain even with minimal movement.  Skin:    General: Skin is warm and dry.  Neurological:     General: No focal deficit present.     Mental Status: She is alert and oriented to person, place, and time.  Psychiatric:        Mood and Affect: Mood normal.        Behavior: Behavior normal.        Thought Content: Thought content normal.        Judgment: Judgment normal.     Imaging: MR LUMBAR SPINE WO CONTRAST  Result Date: 02/07/2022 CLINICAL DATA:  Low back pain.  Compression fractures EXAM: MRI LUMBAR SPINE WITHOUT CONTRAST TECHNIQUE: Multiplanar, multisequence MR imaging of the lumbar spine was performed. No intravenous contrast was administered. COMPARISON:  CT 02/07/2022, 12/29/2017 FINDINGS: Segmentation:  Standard. Alignment: Lumbar dextrocurvature. Grade 1 anterolisthesis of L5 on S1. Slight retrolisthesis at L1-L2. Vertebrae:  Superior endplate compression fracture of the L3 vertebral body with approximately 40% vertebral body height loss. Bone marrow edema throughout the vertebral body compatible with an acute or subacute process. Minimal retropulsion at the superior endplate. No fracture extension into the posterior elements. Superior endplate compression fracture of the L4 vertebral body with approximately 45% vertebral body height loss. Minimal retropulsion at the superior endplate. Faint marrow edema favors a subacute process. The remaining lumbar vertebral body heights are maintained. No additional fractures. Hyperintense T2/STIR signal within the L2-3 and L3-4 disc spaces are favored to be posttraumatic without secondary findings to suggest discitis. No suspicious marrow replacing bone lesion. Conus medullaris and  cauda equina: Conus extends to the L1-2 level. Conus and cauda equina appear normal. Paraspinal and other soft tissues: Known infrarenal abdominal aortic aneurysm and bilateral iliac artery aneurysms, as characterized on same day dedicated CT angiogram. Disc levels: T12-L1: Mild disc bulge without foraminal or canal stenosis. L1-L2: Mild disc bulge and bilateral facet hypertrophy. Mild left foraminal stenosis. No canal stenosis. L2-L3: Mild disc bulge with L3 superior endplate retropulsion. Mild-to-moderate bilateral facet arthropathy. No significant foraminal or canal stenosis. L3-L4: Diffuse disc bulge with mild retropulsion of the L4 superior endplate. Moderate bilateral facet arthropathy. Left greater than right subarticular recess stenosis without canal stenosis. Moderate left and mild right foraminal stenosis. L4-L5: Mild disc bulge with shallow disc protrusion in the right subarticular zone. Moderate facet arthropathy. No foraminal or canal stenosis. L5-S1: Minimal disc bulge. Bilateral facet arthropathy. No foraminal or canal stenosis. IMPRESSION: 1. Acute-to-subacute superior endplate compression fractures of the  L3 and L4 vertebral bodies with minimal retropulsion of both superior endplates. 2. Multilevel degenerative changes of the lumbar spine, as described above. No significant canal stenosis at any level. 3. Moderate left and mild right foraminal stenosis at L3-L4. 4. Known infrarenal abdominal aortic aneurysm and bilateral iliac artery aneurysms, as characterized on same day dedicated CT angiogram. Electronically Signed   By: Duanne GuessNicholas  Plundo D.O.   On: 02/07/2022 09:29   MR THORACIC SPINE WO CONTRAST  Result Date: 02/07/2022 CLINICAL DATA:  87 year old female with mid back pain. T5 through T7 compression fractures demonstrated on MRI earlier this month. New T10 vertebral body suspected on CT this morning, along with age indeterminate L3 and L4 compression fractures. EXAM: MRI THORACIC SPINE WITHOUT CONTRAST TECHNIQUE: Multiplanar, multisequence MR imaging of the thoracic spine was performed. No intravenous contrast was administered. COMPARISON:  CTA CT Chest, Abdomen, and Pelvis, CT thoracic and lumbar spine 0306 hours today. Recent thoracic MRI 01/14/2022.  Cervical spine CT 11/13/2020. FINDINGS: Limited cervical spine imaging:  Unremarkable for age. Thoracic spine segmentation: Normal by CT this morning, same numbering system used on 01/14/2022. Alignment: Midthoracic kyphosis does not appear significantly changed from earlier this month. Vertebrae: Severe T5, T6 and T7 compression fractures with near vertebra plana and ongoing marrow edema. Patchy new anterior superior endplate marrow edema at both the T4 inferior and T8 superior endplates on series 4, image 11. Vertebral body height at both levels appears maintained. T10 inferior endplate compression fracture with patchy new marrow edema and 25% loss of height since 01/14/2022. Mild retropulsion of the T10 posteroinferior endplate. T10 posterior elements appear intact. Other thoracic levels appears stable and intact. Lumbar vertebrae are reported separately  today. Cord: Stable, normal. Capacious thoracic spinal canal at most levels. Conus medullaris is at T12-L1. Paraspinal and other soft tissues: Stable to the CT Chest, Abdomen, and Pelvis this morning. Disc levels: Unchanged thoracic spine degeneration which is generally mild for age. No spinal stenosis, even when considering the mild compression fracture related retropulsion. IMPRESSION: 1. Acute T10 compression fracture with 25% loss of vertebral body height is new since 01/14/2022. No significant retropulsion or other complicating features. 2. Severe T5 through T7 compression fractures (near vertebra plana) with ongoing marrow edema. New anterior endplate marrow edema at the adjacent T4 and T8 levels is favored to be reactive, rather than due to impending collapse. 3. No thoracic spinal stenosis or spinal cord signal abnormality. 4. Lumbar MRI today reported separately. Electronically Signed   By: Odessa FlemingH  Hall M.D.   On: 02/07/2022 09:21   CT L-SPINE NO  CHARGE  Result Date: 02/07/2022 CLINICAL DATA:  Back pain. EXAM: CT Thoracic and Lumbar spine without contrast TECHNIQUE: Multiplanar CT images of the thoracic and lumbar spine were reconstructed from contemporary CTA of the Chest, Abdomen, and Pelvis. RADIATION DOSE REDUCTION: This exam was performed according to the departmental dose-optimization program which includes automated exposure control, adjustment of the mA and/or kV according to patient size and/or use of iterative reconstruction technique. CONTRAST:  None or No additional COMPARISON:  Thoracic spine MRI recently 01/14/2022. CTA chest, abdomen and pelvis 12/29/2017. FINDINGS: CT THORACIC SPINE FINDINGS Alignment: There is a nearly 60 degrees kyphotic angle centered at C5-7, mild dextroscoliosis. This was seen previously. Vertebrae: Generalized osteopenia. Moderate to severe subacute compression fractures of T5, T6 and T7 are again noted, with slight retropulsion. There are mild but increased concavities  of the upper and lower plates of the [id number] vertebral body consistent with a mild central compression fracture new from 01/14/2022. The central height loss no more than 20%, without significant anterior or posterior height loss. All pedicles and posterior elements are intact. No aggressive primary bone lesion is seen. Other thoracic vertebrae are preserved in height. There is thoracic spondylosis. Paraspinal and other soft tissues: No paraspinal hematoma. Other soft tissue findings described separately. Disc levels: Disc space loss T5-8 appears similar with otherwise normal disc heights. Small disc protrusions are again noted and T11-12 and T10-11 and mild disc bulging at T12-L1 and L1-2. There is mild bilateral foraminal stenosis at T7-8 due to the T7 compression fracture. Other foramina appear generally clear despite slight facet spurring at most levels. CT LUMBAR SPINE FINDINGS Segmentation: 5 lumbar type vertebrae. Alignment: Chronic mild-to-moderate dextro rotary scoliosis, apex L2-3. Vertebrae: Osteopenia. There are new age-indeterminate superior endplate compression fractures of L3 and L4, both with slight posterosuperior retropulsion, up to 2 mm retropulsion at L3 and after 3-4 mm retropulsion at L4. There is no bony fragmentation anteriorly in these may be subacute, but there is sclerosis still significant vertebral bodies consistent with a healing response. Loss of vertebral height at L3 is 40-50% anteriorly and posteriorly, 50-60% centrally. Loss of vertebral height at L4 is 50% anteriorly and centrally 30-40% posteriorly. Other vertebrae are normal in heights. No primary bone lesion is seen. Paraspinal and other soft tissues: Enlarging infrarenal AAA, described and reported separately. No paraspinal hematoma. Disc levels: There are normal lumbar disc heights from T12-L1 down. There are mild disc bulges T12-L1 and L1-2, with posterior disc osteophyte complexes L2-3 and L3-4 causing moderate spinal canal  stenosis in combination with hypertrophic facet changes. There are mild nonstenosing disc bulges at L4-5 and L5-S1. Facet hypertrophy is seen at all levels but is greatest from L3-4 down. The foramina however, are not significantly stenotic. Other: All pedicles and posterior elements are intact. There is no displaced sacral insufficiency fracture. IMPRESSION: 1. Osteopenia and degenerative change. 2. Moderate to severe subacute compression fractures of T5, T6, and T7 with slight retropulsion. Unchanged from 01/14/2022 MRI. 3. Mild central compression fracture of the T10 vertebral body, new from 01/14/2022. 4. Age-indeterminate superior endplate compression fractures of L3 and L4, with slight retropulsion at L3 and L4. The gross appearance suggests they are probably nonacute, most likely subacute or chronic. 5. Enlarging infrarenal AAA, described and reported separately. Electronically Signed   By: Almira BarKeith  Chesser M.D.   On: 02/07/2022 05:38   CT T-SPINE NO CHARGE  Result Date: 02/07/2022 CLINICAL DATA:  Back pain. EXAM: CT Thoracic and Lumbar spine without contrast TECHNIQUE: Multiplanar  CT images of the thoracic and lumbar spine were reconstructed from contemporary CTA of the Chest, Abdomen, and Pelvis. RADIATION DOSE REDUCTION: This exam was performed according to the departmental dose-optimization program which includes automated exposure control, adjustment of the mA and/or kV according to patient size and/or use of iterative reconstruction technique. CONTRAST:  None or No additional COMPARISON:  Thoracic spine MRI recently 01/14/2022. CTA chest, abdomen and pelvis 12/29/2017. FINDINGS: CT THORACIC SPINE FINDINGS Alignment: There is a nearly 60 degrees kyphotic angle centered at C5-7, mild dextroscoliosis. This was seen previously. Vertebrae: Generalized osteopenia. Moderate to severe subacute compression fractures of T5, T6 and T7 are again noted, with slight retropulsion. There are mild but increased  concavities of the upper and lower plates of the N27 vertebral body consistent with a mild central compression fracture new from 01/14/2022. The central height loss no more than 20%, without significant anterior or posterior height loss. All pedicles and posterior elements are intact. No aggressive primary bone lesion is seen. Other thoracic vertebrae are preserved in height. There is thoracic spondylosis. Paraspinal and other soft tissues: No paraspinal hematoma. Other soft tissue findings described separately. Disc levels: Disc space loss T5-8 appears similar with otherwise normal disc heights. Small disc protrusions are again noted and T11-12 and T10-11 and mild disc bulging at T12-L1 and L1-2. There is mild bilateral foraminal stenosis at T7-8 due to the T7 compression fracture. Other foramina appear generally clear despite slight facet spurring at most levels. CT LUMBAR SPINE FINDINGS Segmentation: 5 lumbar type vertebrae. Alignment: Chronic mild-to-moderate dextro rotary scoliosis, apex L2-3. Vertebrae: Osteopenia. There are new age-indeterminate superior endplate compression fractures of L3 and L4, both with slight posterosuperior retropulsion, up to 2 mm retropulsion at L3 and after 3-4 mm retropulsion at L4. There is no bony fragmentation anteriorly in these may be subacute, but there is sclerosis still significant vertebral bodies consistent with a healing response. Loss of vertebral height at L3 is 40-50% anteriorly and posteriorly, 50-60% centrally. Loss of vertebral height at L4 is 50% anteriorly and centrally 30-40% posteriorly. Other vertebrae are normal in heights. No primary bone lesion is seen. Paraspinal and other soft tissues: Enlarging infrarenal AAA, described and reported separately. No paraspinal hematoma. Disc levels: There are normal lumbar disc heights from T12-L1 down. There are mild disc bulges T12-L1 and L1-2, with posterior disc osteophyte complexes L2-3 and L3-4 causing moderate  spinal canal stenosis in combination with hypertrophic facet changes. There are mild nonstenosing disc bulges at L4-5 and L5-S1. Facet hypertrophy is seen at all levels but is greatest from L3-4 down. The foramina however, are not significantly stenotic. Other: All pedicles and posterior elements are intact. There is no displaced sacral insufficiency fracture. IMPRESSION: 1. Osteopenia and degenerative change. 2. Moderate to severe subacute compression fractures of T5, T6, and T7 with slight retropulsion. Unchanged from 01/14/2022 MRI. 3. Mild central compression fracture of the T10 vertebral body, new from 01/14/2022. 4. Age-indeterminate superior endplate compression fractures of L3 and L4, with slight retropulsion at L3 and L4. The gross appearance suggests they are probably nonacute, most likely subacute or chronic. 5. Enlarging infrarenal AAA, described and reported separately. Electronically Signed   By: Telford Nab M.D.   On: 02/07/2022 05:38   CT Angio Chest/Abd/Pel for Dissection W and/or Wo Contrast  Result Date: 02/07/2022 CLINICAL DATA:  Acute aortic syndrome suspected. History of thoracic aortic stent graft and AAA. EXAM: CT ANGIOGRAPHY CHEST, ABDOMEN AND PELVIS TECHNIQUE: Axial chest CT without contrast was initially  obtained. Multidetector CT imaging through the chest, abdomen and pelvis was performed using the standard protocol during bolus administration of intravenous contrast. Multiplanar reconstructed images and MIPs were obtained and reviewed to evaluate the vascular anatomy. RADIATION DOSE REDUCTION: This exam was performed according to the departmental dose-optimization program which includes automated exposure control, adjustment of the mA and/or kV according to patient size and/or use of iterative reconstruction technique. CONTRAST:  60mL OMNIPAQUE IOHEXOL 350 MG/ML SOLN COMPARISON:  CTA chest, abdomen and pelvis 12/29/2017. MRI thoracic spine 01/14/2022. Last chest film was portable  06/03/2021. FINDINGS: CTA CHEST FINDINGS Cardiovascular: There is moderate panchamber cardiomegaly with left chamber predominance. Three-vessel coronary artery calcifications greatest in the LAD. There are calcific plaques in the aorta and scattered calcifications in the great vessels. The great vessels show tortuosity without stenosis. There is a small increased pericardial effusion. The heart has increased in size since 2019. The superior pulmonary veins are prominent. The pulmonary trunk caliber, ectatic measuring 3.3 cm indicating arterial hypertension. There is no central embolus. Interval slight dilatation aortic root at the sinuses of Valsalva where the diameter is 4.2 cm previously 4 cm. Stable ectasia in the ascending aorta, measuring 3.9 cm. The arch segment again measures 2.8 cm. A thoracic aortic stent graft again extends from 1.3 cm distal to the left subclavian takeoff down to T10-11. No focal recurrent aneurysm is seen but the descending segment remains ectatic at 3.5 cm. No acute stroke complication is seen, no aortic dissection, with moderate aortic tortuosity. A penetrating ulcer is again noted just beyond the distal graft landing zone along the left lateral wall where the aortic diameter is 3.2 cm, previously 3 cm. Mediastinum/Nodes: No enlarged mediastinal, hilar, or axillary lymph nodes. Thyroid gland is unremarkable. The trachea and central airways are prominent but clear. There is an increasingly patulous appearance and fluid filling in the upper half of the thoracic esophagus. No wall thickening or hiatal hernia. Lungs/Pleura: Diffuse bronchial thickening especially in the lower lobes. Mild chronic elevation right diaphragm. There is chronic change with subpleural reticulation, stable 4 mm right apical nodule on 8:20, stable 3 mm subpleural nodule laterally in the right upper lobe on 8:55. There is a new pleural-based squarish 6 mm noncalcified nodule in the posterior right apex on 8:20.  There is increased medial left apical pleural-parenchymal scar-like opacity without a nodule like appearance. There are bilateral minimal pleural effusions not seen previously. No pneumothorax is seen or acute lung infiltrates or edema. Musculoskeletal: Osteopenia. There is thoracic kyphosis and again noted moderate to severe anterior wedge compression fractures of the T5-7 vertebrae, all having marrow edema on the recent MRI but without interval increasing retropulsion. The ribcage is intact. No chest wall masses. Review of the MIP images confirms the above findings. CTA ABDOMEN AND PELVIS FINDINGS VASCULAR Aorta: The hiatal aortic segment unchanged, measuring 2.8 cm. There is moderate to heavy calcific wall plaque an enlarging infrarenal fusiform AAA now measuring 4.2 x 4.1 cm, was previously 3.3 x 3.3 cm with moderate thrombosis along its wall. No dissection. Celiac: Patent without evidence of aneurysm, dissection, vasculitis or significant stenosis. There are nonstenosing ostial calcifications. The vessel arises from the left lateral aortic wall SMA: Small amount of non stenosing mixed plaque. The vessel is patent without evidence of aneurysm, dissection, vasculitis or significant stenosis. It arises from the left anterolateral wall. Renals: Both are single. Again noted unchanged there is high-grade calcific origin stenosis on the left and no significant stenosis on the right. IMA:  Small caliber but without focal stenosis, arises just above the infrarenal AAA. Inflow: Both common iliac arteries are heavily calcified. On the left there is diffuse increased aneurysmal dilatation and increased wall thrombus, maximum diameter now 2.7 cm, previously 2.3 cm. On the right there is diffuse common iliac aneurysmal dilatation, scattered wall thrombus and maximum diameter now 2.2 cm, previously 2.0 cm. No dissection or focal stenosis. The external iliac arteries are tortuous but are relatively clear. Both internal iliac  arteries with patchy calcifications without stenosis with vessel tortuosity. 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 Veins: Unopacified and not evaluated Review of the MIP images confirms the above findings. NON-VASCULAR Hepatobiliary: No apparent masses seen. The gallbladder and bile ducts are unremarkable. Pancreas: No significant abnormality. 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 Spleen: No abnormality. Heterogeneous enhancement consistent with bolus phase. Adrenals/Urinary Tract: Scar-like cortical defect posterior left upper pole, unchanged. No adrenal or renal mass enhancement. No urinary stone or obstruction. No bladder thickening is seen. Stomach/Bowel: No dilatation or wall thickening. An appendix is not seen in this patient. There is colonic diverticulosis without evidence of focal diverticulitis. Lymphatic: No adenopathy is seen. Reproductive: Fibroid uterus.  No adnexal mass. Other: No free air, free hemorrhage, free fluid or incarcerated hernia. Musculoskeletal: Age-indeterminate moderate compression fractures of the L3 and 4 vertebral bodies with slight retropulsion new from 2019. There is dextro rotary scoliosis apex at L2-3. Prior left hip nailing. Review of the MIP images confirms the above findings. IMPRESSION: 1. No evidence of acute aortic syndrome. 2. Cardiomegaly with prominent superior pulmonary veins and minimal pleural effusions. No appreciable edema. 3. Chronic lung changes with a new 6 mm posterior right apical nodule. Per Fleischner Society Guidelines, recommend a non-contrast Chest CT at 6-12 months. If patient is high risk for malignancy, consider an additional non-contrast Chest CT at 18-24 months. If patient is low risk for malignancy, non-contrast Chest CT at 18-24  months is optional. These guidelines do not apply to immunocompromised patients and patients with cancer. Follow up in patients with significant comorbidities as clinically warranted. For lung cancer screening, adhere to Lung-RADS guidelines. Reference: Radiology. 2017; 284(1):228-43. 4. Increasingly patulous appearance and fluid filling the upper half of the thoracic esophagus. This could be due to reflux or dysmotility. No wall thickening or hiatal hernia. 5. Dilated aortic root at 4.2 cm, previously 4 cm. Recommend annual imaging followup by CTA or MRA. This recommendation follows 2010 ACCF/AHA/AATS/ACR/ASA/SCA/SCAI/SIR/STS/SVM Guidelines for the Diagnosis and Management of Patients with Thoracic Aortic Disease. Circulation. 2010; 121: Y403-K742: E266-e369. Aortic aneurysm NOS (ICD10-I71.9). Stable ectasia in the ascending aorta, 3.9 cm. 6. 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 7. Increasing size of infrarenal fusiform AAA now 4.2 x 4.1 cm, previously 3.3 x 3.3 cm with moderate thrombosis along its wall. Vascular surgery referral recommended unless already none. 8. 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 9. 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 10. 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 11. Age-indeterminate but subacute moderate compression fractures of T5-7, all having  marrow edema on the recent MRI. 12. Age-indeterminate moderate wedge compression fractures of L3 and L4, with slight retropulsion. 13. Remaining findings discussed above. Electronically Signed   By: Telford Nab M.D.   On: 02/07/2022 05:09   MR THORACIC SPINE WO CONTRAST  Result Date: 01/15/2022 CLINICAL DATA:  Chest and back pain with shortness of breath for 2 months. No known injury. EXAM:  MRI THORACIC SPINE WITHOUT CONTRAST TECHNIQUE: Multiplanar, multisequence MR imaging of the thoracic spine was performed. No intravenous contrast was administered. COMPARISON:  Limited correlation made with chest CTA 12/29/2017 and cervical spine CT 11/13/2020. FINDINGS: Alignment: Localizing images of the cervical spine demonstrate grossly stable spondylosis with mild chronic anterolisthesis at C6-7 and C7-T1. Exaggerated thoracic kyphosis has mildly progressed without significant listhesis. Vertebrae: Previous CTA demonstrated a moderate anterior compression deformity at T6 which has mildly progressed, with greater than 75% loss of vertebral body height anteriorly and mild osseous retropulsion. There is associated mild bone marrow edema. There are new compression fractures at T5 and T7. The T5 fracture is severe with greater than 75% loss of vertebral body height and associated bone marrow edema. The T7 fracture is less severe, resulting in 40% loss of vertebral body height and marrow edema. No other fractures are identified. Cord: The thoracic cord appears normal in signal and caliber.The conus medullaris extends to the L1 level. The thoracic spinal canal appears widely patent. Paraspinal and other soft tissues: Probable mild edema surrounding the midthoracic spine fractures. No epidural fluid collection identified. Trace bilateral pleural effusions. Diffuse dilatation of the descending thoracic aorta status post stent grafting. Disc levels: Relatively mild thoracic spondylosis without high-grade spinal stenosis or nerve root encroachment. Small disc protrusions are noted inferiorly, including a small right paracentral disc protrusion at T10-11, and a small right posterolateral disc protrusion at T11-12. Sagittal images demonstrate mild disc bulging at T12-L1 and L1-2. IMPRESSION: 1. Recent compression fractures at T5 and T7 with associated bone marrow edema, as described. 2. Mildly progressive compression  deformity at T6 with mild osseous retropulsion. 3. No significant spinal stenosis or nerve root encroachment. Mild thoracic spondylosis. Electronically Signed   By: Richardean Sale M.D.   On: 01/15/2022 16:36    Labs:  CBC: Recent Labs    06/09/21 0313 02/06/22 2156 02/06/22 2234 02/08/22 0510 02/10/22 0738  WBC 6.8 6.3  --  5.7 6.1  HGB 8.3* 13.7 13.3 12.1 12.1  HCT 26.0* 42.2 39.0 37.5 38.1  PLT 180 170  --  149* 167    COAGS: Recent Labs    06/04/21 0246 02/10/22 0738  INR 1.2 1.2    BMP: Recent Labs    06/09/21 0313 02/06/22 2156 02/06/22 2234 02/08/22 0940 02/10/22 0738  NA 131* 136 138 137 140  K 4.0 3.4* 3.7 4.1 3.8  CL 100 99 97* 105 102  CO2 24 27  --  22 29  GLUCOSE 91 126* 126* 87 99  BUN 10 18 22 11 15   CALCIUM 8.3* 8.7*  --  8.5* 8.4*  CREATININE 0.45 0.59 0.50 0.42* 0.47  GFRNONAA >60 >60  --  >60 >60    LIVER FUNCTION TESTS: Recent Labs    06/03/21 2242 06/04/21 0246 02/06/22 2156  BILITOT 1.2  --  1.5*  AST 21  --  23  ALT 12  --  13  ALKPHOS 67  --  68  PROT 5.9*  --  5.9*  ALBUMIN 3.3* 3.2* 3.2*    TUMOR MARKERS: No results for  input(s): "AFPTM", "CEA", "CA199", "CHROMGRNA" in the last 8760 hours.  Assessment and Plan:  Compression fractures of L3, L4, T10.  Will proceed with kyphoplasty of L3, L4 and possibly T10 today by Dr. Corliss Skains as IR schedule allows.  Risks and benefits of kyphoplasty were discussed with the patient including, but not limited to education regarding the natural healing process of compression fractures without intervention, bleeding, infection, cement migration which may cause spinal cord damage, paralysis, pulmonary embolism or even death.  This interventional procedure involves the use of X-rays and because of the nature of the planned procedure, it is possible that we will have prolonged use of X-ray fluoroscopy.  Potential radiation risks to you include (but are not limited to) the following: - A  slightly elevated risk for cancer  several years later in life. This risk is typically less than 0.5% percent. This risk is low in comparison to the normal incidence of human cancer, which is 33% for women and 50% for men according to the American Cancer Society. - Radiation induced injury can include skin redness, resembling a rash, tissue breakdown / ulcers and hair loss (which can be temporary or permanent).   The likelihood of either of these occurring depends on the difficulty of the procedure and whether you are sensitive to radiation due to previous procedures, disease, or genetic conditions.   IF your procedure requires a prolonged use of radiation, you will be notified and given written instructions for further action.  It is your responsibility to monitor the irradiated area for the 2 weeks following the procedure and to notify your physician if you are concerned that you have suffered a radiation induced injury.    All of the patient's questions were answered, patient is agreeable to proceed.  Consent signed and in chart.   Thank you for allowing our service to participate in Roshni Burbano 's care.  Electronically Signed: Gwynneth Macleod, PA-C   02/10/2022, 11:00 AM      I spent a total of 55 Miinutes  in face to face in clinical consultation, greater than 50% of which was counseling/coordinating care for kyphoplasty.

## 2022-02-10 NOTE — Sedation Documentation (Addendum)
1212: patient began to complain of "reflux" and stated that she thought "it was the Colace they gave me earlier this morning".  Patient unable to produce any sputum at this time and denies any difficulty breathing at this time.  Patient states that she believes she can stay in current prone position for entire duration of procedure by voices understanding to notify staff immediately if she feels that she can no longer protect her airway or any concerns present themselves.   1230 Attempted to call Dr. Estanislado Pandy to notify of patient complaints.  Contact unsuccessful at this time.  1231: Dr. Estanislado Pandy paged by Loletha Grayer, RT and awaiting returned page at this time.  1238: Contact made with Dr. Estanislado Pandy and he acknowledges patient complaints at this time with no new orders given.  Patient states that she believes for her issue(s) to be resolved at this time and wishes to proceed with procedure at this time and expresses understanding of if issue(s) persist to notify staff IMMEDIATELY and not to hesitate in desire to "just do the procedure".  Patient verbalizes all understanding in witness of staff present in room at this time and continues to request to precede with procedure at this time.

## 2022-02-10 NOTE — Sedation Documentation (Signed)
Per Dr. Estanislado Pandy, patient is not to leave IR suite until he speaks with her again.  Dr. Estanislado Pandy at bedside with patient at this time.    1358: Awaiting arrival of transportation at this time.

## 2022-02-10 NOTE — Progress Notes (Signed)
Patient was taken for the procedure at 1110am

## 2022-02-10 NOTE — Sedation Documentation (Signed)
Patient tolerating procedure well at this time and voices no complaints/concerns and remains to appear in NAD at this time.  Patient responsive to verbal stimuli and resting with eyes closed on table.  Vitals do no suggest any acute distress at this time. Patient breathing out of mouth and encouraged to breath normal with mouth closed and through nose.  End Tidal CO2 not capturing adequately due to such. When patient encourage to breath though nose as request and follows commands/request, End Tidal Co2 captures within the 30s.  MD aware and acknowledges of same.

## 2022-02-10 NOTE — Progress Notes (Signed)
PT Cancellation Note  Patient Details Name: Stephanie Collier MRN: [id number] DOB: June 29, 1935   Cancelled Treatment:    Reason Eval/Treat Not Completed: Active bedrest order; patient back on the floor and noted bedrest till after 5pm.  Will attempt again tomorrow.   Reginia Naas 02/10/2022, 3:06 PM Magda Kiel, PT Acute Rehabilitation Services [phone] 02/10/2022

## 2022-02-10 NOTE — Progress Notes (Signed)
PT Cancellation Note  Patient Details Name: Stephanie Collier MRN: [id number] DOB: [birth year]   Cancelled Treatment:    Reason Eval/Treat Not Completed: Patient at procedure or test/unavailable; patient out of the room for procedure, will attempt later as schedule permits and pt able.    Reginia Naas 02/10/2022, 11:19 AM Magda Kiel, PT Acute Rehabilitation Services Office:[phone] 02/10/2022

## 2022-02-10 NOTE — Progress Notes (Signed)
Patient is back on the floor at 1420pm. Alert and oriented. On 2L [place]

## 2022-02-10 NOTE — Progress Notes (Signed)
PROGRESS NOTE    Stephanie Collier  OVF:[id number] DOB: May 24, 1935 DOA: 02/06/2022 PCP: Leeroy Cha    Brief Narrative:  87 year old with history of hypertension, thoracic aortic aneurysm, COPD and Raynaud's admitted with intractable back pain.  She was found to have multilevel vertebral fractures, she has been less mobile and difficulty to ambulate so brought to the ER.  Admitted for pain management and possible vertebroplasty.   Assessment & Plan:   Intractable back pain secondary to multiple compression fractures: MRI revealed acute to subacute L3/4 and T10 compression fractures, T5-7 compression fractures. Multimodal pain management with oral Tylenol, oxycodone, morphine, lidocaine patches.  Aggressive bowel regimen with a stool softener and daily laxative. IR consulted, planning for kyphoplasty possible today. Continue to work with PT OT, refer to SNF for rehab. On calcitonin for osteoporosis prophylaxis.  Dysphagia: On dysphagia 3 diet.  Declined esophagogram.  Vascular issues and thoracic aneurysm: Stable.  Outpatient follow-up.     DVT prophylaxis: SCDs Start: 02/07/22 1035, declines.   Code Status: DNR Family Communication: None at the bedside.  IR will communicate. Disposition Plan: Status is: Inpatient Remains inpatient appropriate because: Significant back pain, mobility issues, inpatient procedures planned.     Consultants:  Interventional radiology  Procedures:  None  Antimicrobials:  None   Subjective:  Patient seen and examined.  No symptoms at rest, it hurts on mobility.  IR provider at the bedside.  Discussed about procedure and she is agreeable.  Possible procedure today afternoon. Patient had started having bowel movements after use of laxatives.  Denies any other complaints.  Agreeable to rehab after procedure.  Objective: Vitals:   02/09/22 0806 02/09/22 1545 02/09/22 2008 02/10/22 0623  BP: (!) 168/77 (!) 149/85 (!) 167/76 (!) 168/71   Pulse: 80 74 74 82  Resp: 16 17 16 16   Temp: 98.1 F (36.7 C) 97.7 F (36.5 C) 98.1 F (36.7 C) 97.8 F (36.6 C)  TempSrc: Oral Oral Oral Oral  SpO2: 99% 100% 100% 100%  Weight:      Height:        Intake/Output Summary (Last 24 hours) at 02/10/2022 0944 Last data filed at 02/09/2022 2017 Gross per 24 hour  Intake 120 ml  Output 500 ml  Net -380 ml    Filed Weights   02/06/22 2111  Weight: 44 kg    Examination:  General exam: Appears calm and comfortable .  Not in any distress. Respiratory system: No added sounds. Cardiovascular system: S1 & S2 heard, RRR. No pedal edema. Gastrointestinal system: Soft.  Nontender bowel sound present. Central nervous system: Alert and oriented. No focal neurological deficits.  Grossly weak.    Data Reviewed: I have personally reviewed following labs and imaging studies  CBC: Recent Labs  Lab 02/06/22 2156 02/06/22 2234 02/08/22 0510 02/10/22 0738  WBC 6.3  --  5.7 6.1  NEUTROABS  --   --   --  4.1  HGB 13.7 13.3 12.1 12.1  HCT 42.2 39.0 37.5 38.1  MCV 96.3  --  97.4 97.4  PLT 170  --  149* 841    Basic Metabolic Panel: Recent Labs  Lab 02/06/22 2156 02/06/22 2234 02/08/22 0940 02/10/22 0738  NA 136 138 137 140  K 3.4* 3.7 4.1 3.8  CL 99 97* 105 102  CO2 27  --  22 29  GLUCOSE 126* 126* 87 99  BUN 18 22 11 15   CREATININE 0.59 0.50 0.42* 0.47  CALCIUM 8.7*  --  8.5* 8.4*  GFR: Estimated Creatinine Clearance: 36.4 mL/min (by C-G formula based on SCr of 0.47 mg/dL). Liver Function Tests: Recent Labs  Lab 02/06/22 2156  AST 23  ALT 13  ALKPHOS 68  BILITOT 1.5*  PROT 5.9*  ALBUMIN 3.2*    Recent Labs  Lab 02/06/22 2156  LIPASE 33    No results for input(s): "AMMONIA" in the last 168 hours. Coagulation Profile: Recent Labs  Lab 02/10/22 0738  INR 1.2   Cardiac Enzymes: No results for input(s): "CKTOTAL", "CKMB", "CKMBINDEX", "TROPONINI" in the last 168 hours. BNP (last 3 results) No results  for input(s): "PROBNP" in the last 8760 hours. HbA1C: No results for input(s): "HGBA1C" in the last 72 hours. CBG: No results for input(s): "GLUCAP" in the last 168 hours. Lipid Profile: No results for input(s): "CHOL", "HDL", "LDLCALC", "TRIG", "CHOLHDL", "LDLDIRECT" in the last 72 hours. Thyroid Function Tests: No results for input(s): "TSH", "T4TOTAL", "FREET4", "T3FREE", "THYROIDAB" in the last 72 hours. Anemia Panel: No results for input(s): "VITAMINB12", "FOLATE", "FERRITIN", "TIBC", "IRON", "RETICCTPCT" in the last 72 hours. Sepsis Labs: No results for input(s): "PROCALCITON", "LATICACIDVEN" in the last 168 hours.  Recent Results (from the past 240 hour(s))  Resp panel by RT-PCR (RSV, Flu A&B, Covid) Urine, Clean Catch     Status: None   Collection Time: 02/06/22  9:59 PM   Specimen: Urine, Clean Catch; Nasal Swab  Result Value Ref Range Status   SARS Coronavirus 2 by RT PCR NEGATIVE NEGATIVE Final    Comment: (NOTE) SARS-CoV-2 target nucleic acids are NOT DETECTED.  The SARS-CoV-2 RNA is generally detectable in upper respiratory specimens during the acute phase of infection. The lowest concentration of SARS-CoV-2 viral copies this assay can detect is 138 copies/mL. A negative result does not preclude SARS-Cov-2 infection and should not be used as the sole basis for treatment or other patient management decisions. A negative result may occur with  improper specimen collection/handling, submission of specimen other than nasopharyngeal swab, presence of viral mutation(s) within the areas targeted by this assay, and inadequate number of viral copies(<138 copies/mL). A negative result must be combined with clinical observations, patient history, and epidemiological information. The expected result is Negative.  Fact Sheet for Patients:  EntrepreneurPulse.com.au  Fact Sheet for Healthcare Providers:  IncredibleEmployment.be  This test is  no t yet approved or cleared by the [place] FDA and  has been authorized for detection and/or diagnosis of SARS-CoV-2 by FDA under an Emergency Use Authorization (EUA). This EUA will remain  in effect (meaning this test can be used) for the duration of the COVID-19 declaration under Section 564(b)(1) of the Act, 21 U.S.C.section 360bbb-3(b)(1), unless the authorization is terminated  or revoked sooner.       Influenza A by PCR NEGATIVE NEGATIVE Final   Influenza B by PCR NEGATIVE NEGATIVE Final    Comment: (NOTE) The Xpert Xpress SARS-CoV-2/FLU/RSV plus assay is intended as an aid in the diagnosis of influenza from Nasopharyngeal swab specimens and should not be used as a sole basis for treatment. Nasal washings and aspirates are unacceptable for Xpert Xpress SARS-CoV-2/FLU/RSV testing.  Fact Sheet for Patients: EntrepreneurPulse.com.au  Fact Sheet for Healthcare Providers: IncredibleEmployment.be  This test is not yet approved or cleared by the [place] FDA and has been authorized for detection and/or diagnosis of SARS-CoV-2 by FDA under an Emergency Use Authorization (EUA). This EUA will remain in effect (meaning this test can be used) for the duration of the COVID-19 declaration under Section 564(b)(1)  of the Act, 21 U.S.C. section 360bbb-3(b)(1), unless the authorization is terminated or revoked.     Resp Syncytial Virus by PCR NEGATIVE NEGATIVE Final    Comment: (NOTE) Fact Sheet for Patients: EntrepreneurPulse.com.au  Fact Sheet for Healthcare Providers: IncredibleEmployment.be  This test is not yet approved or cleared by the [place] FDA and has been authorized for detection and/or diagnosis of SARS-CoV-2 by FDA under an Emergency Use Authorization (EUA). This EUA will remain in effect (meaning this test can be used) for the duration of the COVID-19 declaration under Section  564(b)(1) of the Act, 21 U.S.C. section 360bbb-3(b)(1), unless the authorization is terminated or revoked.  Performed at [facility], [place] [address] [place], [place] [address]          Radiology Studies: No results found.      Scheduled Meds:  calcitonin (salmon)  1 spray Alternating Nares Daily   docusate sodium  100 mg Oral BID   feeding supplement  237 mL Oral BID BM   lidocaine  1-3 patch Transdermal Q24H   nystatin  5 mL Oral QID   pantoprazole  40 mg Oral BID AC   polyethylene glycol  17 g Oral Daily   Continuous Infusions:   LOS: 3 days    Time spent: 35 minutes.    Barb Merino, MD [facility] Pager [phone]

## 2022-02-10 NOTE — Care Management Important Message (Signed)
Important Message  Patient Details  Name: Stephanie Collier MRN: [id number] Date of Birth: 28-Apr-1935   Medicare Important Message Given:  Yes     [name] 02/10/2022, 1:39 PM

## 2022-02-10 NOTE — Progress Notes (Signed)
Patient ID: Stephanie Collier, female   DOB: February 16, 1935, 87 y.o.   MRN: [id number] INR.    Post kyphoplasty instructions.    No stooping, bending or lifting weights above 10 pounds for 2 weeks.  Use walker to ambulate for 2 weeks.  No driving for 2 weeks.  Contact requesting MD for results of the biopsy report.  Arlean Hopping MD.

## 2022-02-11 DIAGNOSIS — M549 Dorsalgia, unspecified: Secondary | ICD-10-CM | POA: Diagnosis not present

## 2022-02-11 LAB — SURGICAL PATHOLOGY

## 2022-02-11 NOTE — Progress Notes (Signed)
PROGRESS NOTE    Stephanie Collier  SEG:[id number] DOB: [birth year] DOA: 02/06/2022 PCP: Lorenda Ishihara    Brief Narrative:  87 year old with history of hypertension, thoracic aortic aneurysm, COPD and Raynaud's admitted with intractable back pain.  She was found to have multilevel vertebral fractures, she has been less mobile and difficulty to ambulate so brought to the ER.  Admitted for pain management and possible vertebroplasty.   Assessment & Plan:   Intractable back pain secondary to multiple compression fractures: MRI revealed acute to subacute L3/4 and T10 compression fractures, T5-7 compression fractures. Multimodal pain management with oral Tylenol, oxycodone, morphine, lidocaine patches.  Aggressive bowel regimen with a stool softener and daily laxative. IR performed image guided kyphoplasty L3 and L4.  Bone biopsy is done.   Not much difference in pain.   Continue to work with PT OT, refer to SNF for rehab. On calcitonin for osteoporosis prophylaxis.  Dysphagia: On dysphagia 3 diet.  Declined esophagogram.  Vascular issues and thoracic aneurysm: Stable.  Outpatient follow-up.     DVT prophylaxis: SCDs Start: 02/07/22 1035, declines.   Code Status: DNR with full scope of treatment. Family Communication: None at the bedside.   Disposition Plan: Status is: Inpatient Remains inpatient appropriate because: Significant back pain, mobility issues, inpatient procedures . Needs a skilled nursing rehab.     Consultants:  Interventional radiology  Procedures:  Balloon kyphoplasty L3 and L4 on 1/31  Antimicrobials:  None   Subjective:  Patient seen and examined.  Multiple questions answered.  She had some more pain after procedure.  She really does not want to go to SNF but may not have any choice.  She wishes she could get out of the bed without hurting her back.  We discussed about using more pain medications to help her facilitate to get physical therapy.  She is  agreeable.  Patient had a question about DNR, MOST form was provided for education.  She would like to be DNR with full scope of treatment.  Objective: Vitals:   02/10/22 1836 02/10/22 1954 02/11/22 0630 02/11/22 0813  BP: (!) 152/81 (!) 158/77 (!) 145/72 (!) 159/71  Pulse: 85 88 84 79  Resp: 16 17 17 17   Temp: (!) 96.4 F (35.8 C) 98 F (36.7 C)  (!) 97.5 F (36.4 C)  TempSrc:  Oral  Oral  SpO2: 97% 97% 95% 96%  Weight:      Height:       No intake or output data in the 24 hours ending 02/11/22 1113  Filed Weights   02/06/22 2111  Weight: 44 kg    Examination:  General exam: Looks comfortable.  Slightly anxious. Respiratory system: No added sounds. Cardiovascular system: S1 & S2 heard, RRR. No pedal edema. Gastrointestinal system: Soft.  Nontender bowel sound present. Central nervous system: Alert and oriented. No focal neurological deficits.  Grossly weak.    Data Reviewed: I have personally reviewed following labs and imaging studies  CBC: Recent Labs  Lab 02/06/22 2156 02/06/22 2234 02/08/22 0510 02/10/22 0738  WBC 6.3  --  5.7 6.1  NEUTROABS  --   --   --  4.1  HGB 13.7 13.3 12.1 12.1  HCT 42.2 39.0 37.5 38.1  MCV 96.3  --  97.4 97.4  PLT 170  --  149* 167   Basic Metabolic Panel: Recent Labs  Lab 02/06/22 2156 02/06/22 2234 02/08/22 0940 02/10/22 0738  NA 136 138 137 140  K 3.4* 3.7 4.1 3.8  CL  99 97* 105 102  CO2 27  --  22 29  GLUCOSE 126* 126* 87 99  BUN 18 22 11 15   CREATININE 0.59 0.50 0.42* 0.47  CALCIUM 8.7*  --  8.5* 8.4*   GFR: Estimated Creatinine Clearance: 36.4 mL/min (by C-G formula based on SCr of 0.47 mg/dL). Liver Function Tests: Recent Labs  Lab 02/06/22 2156  AST 23  ALT 13  ALKPHOS 68  BILITOT 1.5*  PROT 5.9*  ALBUMIN 3.2*   Recent Labs  Lab 02/06/22 2156  LIPASE 33   No results for input(s): "AMMONIA" in the last 168 hours. Coagulation Profile: Recent Labs  Lab 02/10/22 0738  INR 1.2   Cardiac  Enzymes: No results for input(s): "CKTOTAL", "CKMB", "CKMBINDEX", "TROPONINI" in the last 168 hours. BNP (last 3 results) No results for input(s): "PROBNP" in the last 8760 hours. HbA1C: No results for input(s): "HGBA1C" in the last 72 hours. CBG: No results for input(s): "GLUCAP" in the last 168 hours. Lipid Profile: No results for input(s): "CHOL", "HDL", "LDLCALC", "TRIG", "CHOLHDL", "LDLDIRECT" in the last 72 hours. Thyroid Function Tests: No results for input(s): "TSH", "T4TOTAL", "FREET4", "T3FREE", "THYROIDAB" in the last 72 hours. Anemia Panel: No results for input(s): "VITAMINB12", "FOLATE", "FERRITIN", "TIBC", "IRON", "RETICCTPCT" in the last 72 hours. Sepsis Labs: No results for input(s): "PROCALCITON", "LATICACIDVEN" in the last 168 hours.  Recent Results (from the past 240 hour(s))  Resp panel by RT-PCR (RSV, Flu A&B, Covid) Urine, Clean Catch     Status: None   Collection Time: 02/06/22  9:59 PM   Specimen: Urine, Clean Catch; Nasal Swab  Result Value Ref Range Status   SARS Coronavirus 2 by RT PCR NEGATIVE NEGATIVE Final    Comment: (NOTE) SARS-CoV-2 target nucleic acids are NOT DETECTED.  The SARS-CoV-2 RNA is generally detectable in upper respiratory specimens during the acute phase of infection. The lowest concentration of SARS-CoV-2 viral copies this assay can detect is 138 copies/mL. A negative result does not preclude SARS-Cov-2 infection and should not be used as the sole basis for treatment or other patient management decisions. A negative result may occur with  improper specimen collection/handling, submission of specimen other than nasopharyngeal swab, presence of viral mutation(s) within the areas targeted by this assay, and inadequate number of viral copies(<138 copies/mL). A negative result must be combined with clinical observations, patient history, and epidemiological information. The expected result is Negative.  Fact Sheet for Patients:   02/08/22  Fact Sheet for Healthcare Providers:  BloggerCourse.com  This test is no t yet approved or cleared by the [employer] FDA and  has been authorized for detection and/or diagnosis of SARS-CoV-2 by FDA under an Emergency Use Authorization (EUA). This EUA will remain  in effect (meaning this test can be used) for the duration of the COVID-19 declaration under Section 564(b)(1) of the Act, 21 U.S.C.section 360bbb-3(b)(1), unless the authorization is terminated  or revoked sooner.       Influenza A by PCR NEGATIVE NEGATIVE Final   Influenza B by PCR NEGATIVE NEGATIVE Final    Comment: (NOTE) The Xpert Xpress SARS-CoV-2/FLU/RSV plus assay is intended as an aid in the diagnosis of influenza from Nasopharyngeal swab specimens and should not be used as a sole basis for treatment. Nasal washings and aspirates are unacceptable for Xpert Xpress SARS-CoV-2/FLU/RSV testing.  Fact Sheet for Patients: [place]  Fact Sheet for Healthcare Providers: BloggerCourse.com  This test is not yet approved or cleared by the [employer].[place] FDA and has  been authorized for detection and/or diagnosis of SARS-CoV-2 by FDA under an Emergency Use Authorization (EUA). This EUA will remain in effect (meaning this test can be used) for the duration of the COVID-19 declaration under Section 564(b)(1) of the Act, 21 U.S.C. section 360bbb-3(b)(1), unless the authorization is terminated or revoked.     Resp Syncytial Virus by PCR NEGATIVE NEGATIVE Final    Comment: (NOTE) Fact Sheet for Patients: EntrepreneurPulse.com.au  Fact Sheet for Healthcare Providers: IncredibleEmployment.be  This test is not yet approved or cleared by the [place] FDA and has been authorized for detection and/or diagnosis of SARS-CoV-2 by FDA under an Emergency Use  Authorization (EUA). This EUA will remain in effect (meaning this test can be used) for the duration of the COVID-19 declaration under Section 564(b)(1) of the Act, 21 U.S.C. section 360bbb-3(b)(1), unless the authorization is terminated or revoked.  Performed at [facility], [place] [address], [place],  [address]          Radiology Studies: IR KYPHO LUMBAR INC FX REDUCE BONE BX UNI/BIL CANNULATION INC/IMAGING  Result Date: 02/11/2022 INDICATION: Severe low back pain secondary to compression fractures at L3 and L4. EXAM: BALLOON KYPHOPLASTY AT L3 AND L4 COMPARISON:  Recent thoracic and lumbosacral MRI scan. MEDICATIONS: As antibiotic prophylaxis, Ancef 2 g IV was ordered pre-procedure and administered intravenously within 1 hour of incision. All current medications are in the EMR and have been reviewed as part of this encounter. ANESTHESIA/SEDATION: Moderate (conscious) sedation was employed during this procedure. A total of Versed 1 mg and 1 mg Dilaudid was administered intravenously by the radiology nurse. Total intra-service moderate Sedation Time: 50 minutes. The patient's level of consciousness and vital signs were monitored continuously by radiology nursing throughout the procedure under my direct supervision. FLUOROSCOPY: Radiation Exposure Index (as provided by the fluoroscopic device): 21 minutes 0272 mGy Kerma COMPLICATIONS: None immediate. PROCEDURE: Following a full explanation of the procedure along with the potential associated complications, an informed witnessed consent was obtained. The patient was placed prone on the fluoroscopic table. The skin overlying the lumbar region was then prepped and draped in the usual sterile fashion. The right pedicle at L3, and the left pedicle at L4 were then infiltrated with 0.25% bupivacaine followed by the advancement of an 11-gauge Jamshidi needle through each pedicle into the posterior one-third at L3 and L4. These were then exchanged  for a Kyphon advanced osteo introducer system comprised of a working cannula and a Kyphon osteo drill at each level. This combination at both levels was then advanced over a Kyphon osteo bone pin until the tip of the Kyphon osteo drill was in the posterior third at L3 and L4. At this time, the bone pin was removed at L3 and L4. In a medial trajectory, the combination was advanced until the tip of the working cannula was inside the posterior one-third at L3 and L4. The osteo drill was removed and a core sample sent for pathologic analysis from both levels. Through the working cannulae, a Kyphon bone biopsy device was advanced to within 5 mm of the anterior aspect of L3 and L4. A core sample from this was also sent for pathologic analysis. Through the working cannulae, a Kyphon inflatable bone tamp 20 x 3 was advanced and positioned with the distal marker 5 mm from the anterior aspect of L3. Crossing of the midline was seen on the AP projection. At this time, the balloons were expanded using contrast via a Kyphon inflation  syringe device via microtubing. Inflations were continued until there was apposition with the superior endplates. At this time, methylmethacrylate mixture was reconstituted with Tobramycin in the Kyphon bone mixing device system. This was then loaded onto the Kyphon bone fillers. The balloons were deflated and removed followed by the instillation of 4-1/2 bone filler equivalents of methylmethacrylate mixture at each level with excellent filling in the AP and lateral projections. No extravasation was noted in the disk spaces or posteriorly into the spinal canal. No epidural venous contamination was seen. The working cannula and the bone fillers were then retrieved and removed. Hemostasis was achieved at the skin entry sites. Patient tolerated the procedure well. IMPRESSION: 1. Status post fluoroscopic-guided needle placement for deep core bone biopsy at L3 and L4. 2. Status post vertebral body  augmentation using balloon kyphoplasty at L3 and L4 as described without event. If the patient has known osteoporosis, recommend treatment as clinically indicated. If the patient's bone density status is unknown, DEXA scan is recommended. Electronically Signed   By: Luanne Bras M.D.   On: 02/11/2022 07:54   IR KYPHO EA ADDL LEVEL THORACIC OR LUMBAR  Result Date: 02/11/2022 INDICATION: Severe low back pain secondary to compression fractures at L3 and L4. EXAM: BALLOON KYPHOPLASTY AT L3 AND L4 COMPARISON:  Recent thoracic and lumbosacral MRI scan. MEDICATIONS: As antibiotic prophylaxis, Ancef 2 g IV was ordered pre-procedure and administered intravenously within 1 hour of incision. All current medications are in the EMR and have been reviewed as part of this encounter. ANESTHESIA/SEDATION: Moderate (conscious) sedation was employed during this procedure. A total of Versed 1 mg and 1 mg Dilaudid was administered intravenously by the radiology nurse. Total intra-service moderate Sedation Time: 50 minutes. The patient's level of consciousness and vital signs were monitored continuously by radiology nursing throughout the procedure under my direct supervision. FLUOROSCOPY: Radiation Exposure Index (as provided by the fluoroscopic device): 21 minutes 1610 mGy Kerma COMPLICATIONS: None immediate. PROCEDURE: Following a full explanation of the procedure along with the potential associated complications, an informed witnessed consent was obtained. The patient was placed prone on the fluoroscopic table. The skin overlying the lumbar region was then prepped and draped in the usual sterile fashion. The right pedicle at L3, and the left pedicle at L4 were then infiltrated with 0.25% bupivacaine followed by the advancement of an 11-gauge Jamshidi needle through each pedicle into the posterior one-third at L3 and L4. These were then exchanged for a Kyphon advanced osteo introducer system comprised of a working cannula and  a Kyphon osteo drill at each level. This combination at both levels was then advanced over a Kyphon osteo bone pin until the tip of the Kyphon osteo drill was in the posterior third at L3 and L4. At this time, the bone pin was removed at L3 and L4. In a medial trajectory, the combination was advanced until the tip of the working cannula was inside the posterior one-third at L3 and L4. The osteo drill was removed and a core sample sent for pathologic analysis from both levels. Through the working cannulae, a Kyphon bone biopsy device was advanced to within 5 mm of the anterior aspect of L3 and L4. A core sample from this was also sent for pathologic analysis. Through the working cannulae, a Kyphon inflatable bone tamp 20 x 3 was advanced and positioned with the distal marker 5 mm from the anterior aspect of L3. Crossing of the midline was seen on the AP projection. At this time,  the balloons were expanded using contrast via a Kyphon inflation syringe device via microtubing. Inflations were continued until there was apposition with the superior endplates. At this time, methylmethacrylate mixture was reconstituted with Tobramycin in the Kyphon bone mixing device system. This was then loaded onto the Kyphon bone fillers. The balloons were deflated and removed followed by the instillation of 4-1/2 bone filler equivalents of methylmethacrylate mixture at each level with excellent filling in the AP and lateral projections. No extravasation was noted in the disk spaces or posteriorly into the spinal canal. No epidural venous contamination was seen. The working cannula and the bone fillers were then retrieved and removed. Hemostasis was achieved at the skin entry sites. Patient tolerated the procedure well. IMPRESSION: 1. Status post fluoroscopic-guided needle placement for deep core bone biopsy at L3 and L4. 2. Status post vertebral body augmentation using balloon kyphoplasty at L3 and L4 as described without event. If the  patient has known osteoporosis, recommend treatment as clinically indicated. If the patient's bone density status is unknown, DEXA scan is recommended. Electronically Signed   By: Luanne Bras M.D.   On: 02/11/2022 07:54        Scheduled Meds:  calcitonin (salmon)  1 spray Alternating Nares Daily   docusate sodium  100 mg Oral BID   feeding supplement  237 mL Oral BID BM   lidocaine  1-3 patch Transdermal Q24H   nystatin  5 mL Oral QID   pantoprazole  40 mg Oral BID AC   polyethylene glycol  17 g Oral Daily   Continuous Infusions:   ceFAZolin (ANCEF) IV       LOS: 4 days    Time spent: 35 minutes.    Barb Merino, MD [facility] Pager [phone]

## 2022-02-11 NOTE — Plan of Care (Signed)
  Problem: Education: Goal: Knowledge of General Education information will improve Description: Including pain rating scale, medication(s)/side effects and non-pharmacologic comfort measures Outcome: Progressing   Problem: Clinical Measurements: Goal: Respiratory complications will improve Outcome: Progressing   Problem: Activity: Goal: Risk for activity intolerance will decrease Outcome: Progressing   Problem: Nutrition: Goal: Adequate nutrition will be maintained Outcome: Progressing   Problem: Coping: Goal: Level of anxiety will decrease Outcome: Progressing   Problem: Elimination: Goal: Will not experience complications related to bowel motility Outcome: Progressing Goal: Will not experience complications related to urinary retention Outcome: Progressing   Problem: Pain Managment: Goal: General experience of comfort will improve Outcome: Progressing   Problem: Safety: Goal: Ability to remain free from injury will improve Outcome: Progressing   Problem: Skin Integrity: Goal: Risk for impaired skin integrity will decrease Outcome: Progressing   

## 2022-02-11 NOTE — Progress Notes (Signed)
Occupational Therapy Treatment Patient Details Name: Stephanie Collier MRN: [id number] DOB: Jul 04, 1935 Today's Date: 02/11/2022   History of present illness 87 y.o. female presents to [facility] hospital on 02/06/2022 with back pain. MRI 1/28 demonstrates acute/subacute L3/4 and T10 compression fxs, also with severe T5-7 compression fxs. PMH includes AAA, HTN, thoracic aortic aneurysm, COPD, raynaud's.   OT comments  Patient unable to provide progress on this date due to pain. Patient eager to get OOB and instructed on log rolling technique. Patient unable to come to full sitting due to pain and assisted back to side lying and supine. Bed placed in chair position with HOB at 40 degrees.  Patient is motivated to increase mobility and independence. Acute OT to continue to follow to address bathing, dressing, and functional transfers.    Recommendations for follow up therapy are one component of a multi-disciplinary discharge planning process, led by the attending physician.  Recommendations may be updated based on patient status, additional functional criteria and insurance authorization.    Follow Up Recommendations  No OT follow up     Assistance Recommended at Discharge Intermittent Supervision/Assistance  Patient can return home with the following  Assist for transportation;Assistance with cooking/housework;A little help with walking and/or transfers;A little help with bathing/dressing/bathroom   Equipment Recommendations  None recommended by OT    Recommendations for Other Services      Precautions / Restrictions Precautions Precautions: Fall;Back Precaution Booklet Issued: No Precaution Comments: reviewed back precautions Restrictions Weight Bearing Restrictions: No       Mobility Bed Mobility Overal bed mobility: Needs Assistance Bed Mobility: Rolling, Sidelying to Sit Rolling: Min assist Sidelying to sit: Max assist       General bed mobility comments: unable to come to full sitting  due to pain and assisted back to sidelying and supine    Transfers Overall transfer level: Needs assistance                 General transfer comment: not attempted due to pain     Balance Overall balance assessment: Needs assistance     Sitting balance - Comments: unable to fully sit on EOB                                   ADL either performed or assessed with clinical judgement   ADL Overall ADL's : Needs assistance/impaired     Grooming: Wash/dry hands;Wash/dry face;Oral care;Set up;Sitting;Bed level Grooming Details (indicate cue type and reason): performed in bed in chair position                               General ADL Comments: limited by pain    Extremity/Trunk Assessment              Vision       Perception     Praxis      Cognition Arousal/Alertness: Awake/alert Behavior During Therapy: Flat affect Overall Cognitive Status: Within Functional Limits for tasks assessed                                 General Comments: able to recall precautions with cues        Exercises      Shoulder Instructions       General Comments VSS on 2 liters  Pertinent Vitals/ Pain       Pain Assessment Pain Assessment: Faces Faces Pain Scale: Hurts whole lot Pain Location: back with activity Pain Descriptors / Indicators: Aching, Grimacing, Discomfort Pain Intervention(s): Limited activity within patient's tolerance, Monitored during session, Repositioned, RN gave pain meds during session  Home Living                                          Prior Functioning/Environment              Frequency  Min 2X/week        Progress Toward Goals  OT Goals(current goals can now be found in the care plan section)  Progress towards OT goals: Progressing toward goals  Acute Rehab OT Goals Patient Stated Goal: move more OT Goal Formulation: With patient Time For Goal Achievement:  02/19/22 Potential to Achieve Goals: Good ADL Goals Pt Will Perform Grooming: with modified independence;standing Pt Will Perform Lower Body Dressing: with modified independence;sit to/from stand Pt Will Transfer to Toilet: with modified independence;regular height toilet;ambulating  Plan Discharge plan remains appropriate    Co-evaluation                 AM-PAC OT "6 Clicks" Daily Activity     Outcome Measure   Help from another person eating meals?: None Help from another person taking care of personal grooming?: A Little Help from another person toileting, which includes using toliet, bedpan, or urinal?: A Little Help from another person bathing (including washing, rinsing, drying)?: A Lot Help from another person to put on and taking off regular upper body clothing?: A Little Help from another person to put on and taking off regular lower body clothing?: A Lot 6 Click Score: 17    End of Session Equipment Utilized During Treatment: Oxygen  OT Visit Diagnosis: Unsteadiness on feet (R26.81)   Activity Tolerance Patient limited by pain   Patient Left in bed;with call bell/phone within reach;with bed alarm set   Nurse Communication Mobility status        Time: 3716-9678 OT Time Calculation (min): 30 min  Charges: OT General Charges $OT Visit: 1 Visit OT Treatments $Self Care/Home Management : 8-22 mins $Therapeutic Activity: 8-22 mins  Lodema Hong, [place]  Office [phone]   Trixie Dredge 02/11/2022, 8:09 AM

## 2022-02-11 NOTE — Progress Notes (Signed)
Physical Therapy Treatment Patient Details Name: Stephanie Collier MRN: [id number] DOB: 1935-05-05 Today's Date: 02/11/2022   History of Present Illness 87 y.o. female presents to [facility] hospital on 02/06/2022 with back pain. MRI 1/28 demonstrates acute/subacute L3/4 and T10 compression fxs, also with severe T5-7 compression fxs. PMH includes AAA, HTN, thoracic aortic aneurysm, COPD, raynaud's.    PT Comments    Patient received in bed, pleasant and motivated to participate, very talkative this morning. Pain levels still high but she was able to progress mobility a bit- able to stand at EOB while PT straightened out linens and then took some side steps along EOB. Poor awareness of back precautions- needed lots of education and cuing today.  Left in bed with HOB elevated, all needs met and alarm active. Remains appropriate for short term rehab in SNF prior to return home.     Recommendations for follow up therapy are one component of a multi-disciplinary discharge planning process, led by the attending physician.  Recommendations may be updated based on patient status, additional functional criteria and insurance authorization.  Follow Up Recommendations  Skilled nursing-short term rehab (<3 hours/day) Can patient physically be transported by private vehicle: No   Assistance Recommended at Discharge Intermittent Supervision/Assistance  Patient can return home with the following A lot of help with walking and/or transfers;A lot of help with bathing/dressing/bathroom;Assistance with cooking/housework;Assist for transportation;Help with stairs or ramp for entrance   Equipment Recommendations  Wheelchair (measurements PT)    Recommendations for Other Services       Precautions / Restrictions Precautions Precautions: Fall;Back Precaution Booklet Issued: No Precaution Comments: reviewed back precautions Restrictions Weight Bearing Restrictions: No     Mobility  Bed Mobility Overal bed mobility:  Needs Assistance Bed Mobility: Rolling, Sidelying to Sit, Sit to Supine Rolling: Min assist Sidelying to sit: Min assist     Sit to sidelying: Min assist General bed mobility comments: MinA for log rolling and side to sit at EOB; when returning to bed impulsively laid back down without log rolling, education provided    Transfers Overall transfer level: Needs assistance Equipment used: Rolling walker (2 wheels) Transfers: Sit to/from Stand Sit to Stand: Min assist           General transfer comment: light boost under R UE    Ambulation/Gait               General Gait Details: deferred due to pain, able to stand for a couple of minutes and take side steps at EOB with RW/MinA    Chief Strategy Officer    Modified Rankin (Stroke Patients Only)       Balance Overall balance assessment: Needs assistance Sitting-balance support: Single extremity supported, Feet unsupported Sitting balance-Leahy Scale: Good     Standing balance support: Bilateral upper extremity supported, Reliant on assistive device for balance Standing balance-Leahy Scale: Poor                              Cognition Arousal/Alertness: Awake/alert Behavior During Therapy: Flat affect Overall Cognitive Status: Within Functional Limits for tasks assessed                                 General Comments: very pleasant but very talkative        Exercises  General Comments General comments (skin integrity, edema, etc.): VSS on 2 liters      Pertinent Vitals/Pain Pain Assessment Pain Assessment: Faces Faces Pain Scale: Hurts whole lot Pain Location: back with activity Pain Descriptors / Indicators: Aching, Grimacing, Discomfort Pain Intervention(s): Limited activity within patient's tolerance, Monitored during session, Premedicated before session    Home Living                          Prior Function            PT  Goals (current goals can now be found in the care plan section) Acute Rehab PT Goals Patient Stated Goal: to reduce back pain, return to prior level of mobility PT Goal Formulation: With patient Time For Goal Achievement: 02/22/22 Potential to Achieve Goals: Fair Progress towards PT goals: Progressing toward goals    Frequency    Min 3X/week      PT Plan Current plan remains appropriate    Co-evaluation              AM-PAC PT "6 Clicks" Mobility   Outcome Measure  Help needed turning from your back to your side while in a flat bed without using bedrails?: A Little Help needed moving from lying on your back to sitting on the side of a flat bed without using bedrails?: A Little Help needed moving to and from a bed to a chair (including a wheelchair)?: A Little Help needed standing up from a chair using your arms (e.g., wheelchair or bedside chair)?: A Little Help needed to walk in hospital room?: A Lot Help needed climbing 3-5 steps with a railing? : Total 6 Click Score: 15    End of Session Equipment Utilized During Treatment: Other (comment) (Deferred gait belt due to trunk pain) Activity Tolerance: Patient tolerated treatment well;Patient limited by pain Patient left: in bed;with call bell/phone within reach;with bed alarm set Nurse Communication: Mobility status PT Visit Diagnosis: Other abnormalities of gait and mobility (R26.89);Muscle weakness (generalized) (M62.81);Pain Pain - part of body:  (back)     Time: 8828-0034 PT Time Calculation (min) (ACUTE ONLY): 25 min  Charges:  $Therapeutic Activity: 23-37 mins                    Deniece Ree PT DPT PN2

## 2022-02-11 NOTE — Progress Notes (Addendum)
2:45pm: Patient's insurance authorization has been approved Josem Kaufmann #[id number]. The next review date is 02/15/22.  12:25pm: CSW submitted for insurance authorization for admission to [facility].  10:30am: CSW received message from MD stating patient is ready for discharge to SNF and her preferred facility is [facility].  CSW spoke with [place] at [facility] who states the facility can acceptthe patient today if insurance authorization can be obtained.  9:15am: CSW faxed patient's clinicals out for review to obtain bed offers.  Madilyn Fireman, MSW, LCSW Transitions of Care  Clinical Social Worker II [phone]

## 2022-02-11 NOTE — Progress Notes (Signed)
Auth received and confirmed with [place] at [facility] they are prepared to admit pt today. Updated MD who reports pt not medically stable for dc today. Anticipate dc Friday. SW will follow.   Wandra Feinstein, MSW, LCSW [phone] (coverage)

## 2022-02-12 DIAGNOSIS — Z7401 Bed confinement status: Secondary | ICD-10-CM | POA: Diagnosis not present

## 2022-02-12 DIAGNOSIS — K5900 Constipation, unspecified: Secondary | ICD-10-CM | POA: Diagnosis not present

## 2022-02-12 DIAGNOSIS — M549 Dorsalgia, unspecified: Secondary | ICD-10-CM | POA: Diagnosis not present

## 2022-02-12 DIAGNOSIS — R278 Other lack of coordination: Secondary | ICD-10-CM | POA: Diagnosis not present

## 2022-02-12 DIAGNOSIS — R1310 Dysphagia, unspecified: Secondary | ICD-10-CM | POA: Diagnosis not present

## 2022-02-12 DIAGNOSIS — R918 Other nonspecific abnormal finding of lung field: Secondary | ICD-10-CM | POA: Diagnosis not present

## 2022-02-12 DIAGNOSIS — S22000S Wedge compression fracture of unspecified thoracic vertebra, sequela: Secondary | ICD-10-CM | POA: Diagnosis not present

## 2022-02-12 DIAGNOSIS — R410 Disorientation, unspecified: Secondary | ICD-10-CM | POA: Diagnosis not present

## 2022-02-12 DIAGNOSIS — D649 Anemia, unspecified: Secondary | ICD-10-CM | POA: Diagnosis not present

## 2022-02-12 DIAGNOSIS — Z66 Do not resuscitate: Secondary | ICD-10-CM | POA: Diagnosis not present

## 2022-02-12 DIAGNOSIS — R296 Repeated falls: Secondary | ICD-10-CM | POA: Diagnosis not present

## 2022-02-12 DIAGNOSIS — R2689 Other abnormalities of gait and mobility: Secondary | ICD-10-CM | POA: Diagnosis not present

## 2022-02-12 DIAGNOSIS — S72145S Nondisplaced intertrochanteric fracture of left femur, sequela: Secondary | ICD-10-CM | POA: Diagnosis not present

## 2022-02-12 DIAGNOSIS — S32020D Wedge compression fracture of second lumbar vertebra, subsequent encounter for fracture with routine healing: Secondary | ICD-10-CM | POA: Diagnosis not present

## 2022-02-12 DIAGNOSIS — E43 Unspecified severe protein-calorie malnutrition: Secondary | ICD-10-CM | POA: Diagnosis not present

## 2022-02-12 DIAGNOSIS — R52 Pain, unspecified: Secondary | ICD-10-CM | POA: Diagnosis not present

## 2022-02-12 DIAGNOSIS — I739 Peripheral vascular disease, unspecified: Secondary | ICD-10-CM | POA: Diagnosis not present

## 2022-02-12 DIAGNOSIS — I469 Cardiac arrest, cause unspecified: Secondary | ICD-10-CM | POA: Diagnosis not present

## 2022-02-12 DIAGNOSIS — Y92009 Unspecified place in unspecified non-institutional (private) residence as the place of occurrence of the external cause: Secondary | ICD-10-CM | POA: Diagnosis not present

## 2022-02-12 DIAGNOSIS — M6281 Muscle weakness (generalized): Secondary | ICD-10-CM | POA: Diagnosis not present

## 2022-02-12 DIAGNOSIS — I10 Essential (primary) hypertension: Secondary | ICD-10-CM | POA: Diagnosis not present

## 2022-02-12 DIAGNOSIS — R011 Cardiac murmur, unspecified: Secondary | ICD-10-CM | POA: Diagnosis not present

## 2022-02-12 DIAGNOSIS — S72002A Fracture of unspecified part of neck of left femur, initial encounter for closed fracture: Secondary | ICD-10-CM | POA: Diagnosis not present

## 2022-02-12 DIAGNOSIS — I71012 Dissection of descending thoracic aorta: Secondary | ICD-10-CM | POA: Diagnosis not present

## 2022-02-12 DIAGNOSIS — Z9181 History of falling: Secondary | ICD-10-CM | POA: Diagnosis not present

## 2022-02-12 DIAGNOSIS — I70209 Unspecified atherosclerosis of native arteries of extremities, unspecified extremity: Secondary | ICD-10-CM | POA: Diagnosis not present

## 2022-02-12 MED ORDER — OXYCODONE HCL 5 MG PO TABS: 5.0000 mg | ORAL_TABLET | Freq: Four times a day (QID) | ORAL | 0 refills | Status: AC | PRN

## 2022-02-12 MED ORDER — LIDOCAINE 5 % EX PTCH: 1.0000 | MEDICATED_PATCH | CUTANEOUS | 0 refills | Status: DC

## 2022-02-12 MED ORDER — DOCUSATE SODIUM 100 MG PO CAPS: 100.0000 mg | ORAL_CAPSULE | Freq: Two times a day (BID) | ORAL | 0 refills | Status: DC

## 2022-02-12 MED ORDER — POLYETHYLENE GLYCOL 3350 17 G PO PACK: 17.0000 g | PACK | Freq: Every day | ORAL | 0 refills | Status: DC | PRN

## 2022-02-12 MED ORDER — ACETAMINOPHEN 325 MG PO TABS: 650.0000 mg | ORAL_TABLET | Freq: Four times a day (QID) | ORAL | Status: DC | PRN

## 2022-02-12 MED ORDER — ENSURE ENLIVE PO LIQD: 237.0000 mL | Freq: Two times a day (BID) | ORAL | 12 refills | Status: DC

## 2022-02-12 MED ORDER — CALCITONIN (SALMON) 200 UNIT/ACT NA SOLN: 1.0000 | Freq: Every day | NASAL | 12 refills | Status: DC

## 2022-02-12 NOTE — Progress Notes (Signed)
CSW confirmed with [place] at [facility] that the patient can be accepted today.  Patient will go to [facility] via [facility] to call report and PTAR when ready. The number to call for report is [phone].  CSW spoke with patient at bedside to inform her of discharge plan. All questions were answered. CSW relocated patient's two floral arrangements to the front desk for her daughter Chiffon Kittleson to pick up today.  Madilyn Fireman, MSW, LCSW Transitions of Care  Clinical Social Worker II [phone]

## 2022-02-12 NOTE — Discharge Summary (Signed)
Physician Discharge Summary  Stephanie Collier EAV:[id number] DOB: 14-Dec-1935 DOA: 02/06/2022  PCP: Lorenda IshiharaVaradarajan, Collier  Admit date: 02/06/2022 Discharge date: 02/12/2022  Admitted From: Home Disposition: Skilled nursing facility  Recommendations for Outpatient Follow-up:  Follow up with PCP in 1-2 weeks after discharge Consult palliative care team at the skilled nursing facility  Discharge Condition: Fair CODE STATUS: Full code Diet recommendation: Low-salt diet, nutritional supplements  Discharge summary: 87 year old with history of hypertension, thoracic aortic aneurysm, COPD and Raynaud's admitted with intractable back pain.  She was found to have multilevel vertebral fractures, she has been less mobile and difficulty to ambulate so brought to the ER.  Admitted for pain management and possible vertebroplasty.     Assessment & Plan:   Intractable back pain secondary to multiple compression fractures:  MRI revealed acute to subacute L3/4 and T10 compression fractures, T5-7 compression fractures. Multimodal pain management with oral Tylenol, oxycodone,lidocaine patches.  Aggressive bowel regimen with a stool softener and daily laxative. IR performed image guided kyphoplasty L3 and L4.  Bone biopsy is done.  results pending.  Continue to work with PT OT, refer to SNF for rehab. On calcitonin for osteoporosis prophylaxis.   Dysphagia: On dysphagia 3 diet.  Declined esophagogram.  Add nutritional supplements.   Vascular issues and thoracic aneurysm: Stable.  Outpatient follow-up.  Goal of care: Multiple discussions about CODE STATUS.  Initially she opted DNR.  Later on she changed herself to full code. She would like to get more information and continue to have conversation. Request palliative care consultation at SNF.  Discharge Diagnoses:  Principal Problem:   Intractable back pain Active Problems:   HTN (hypertension)   Aortic aneurysm, thoracic (HCC)   Compression fracture of  body of thoracic vertebra (HCC)   Lumbar compression fracture, closed, initial encounter (HCC)   PVD (peripheral vascular disease) (HCC)   Dysphagia   Multiple lung nodules    Discharge Instructions  Discharge Instructions     Diet general   Complete by: As directed    Increase activity slowly   Complete by: As directed    No wound care   Complete by: As directed       Allergies as of 02/12/2022       Reactions   Fentanyl Shortness Of Breath   Asa [aspirin]    Per patient stomach cramps   Azithromycin    Other reaction(s): diarrhea   Fosamax [alendronate]    Other reaction(s): indigestion   Labetalol Hcl    Other reaction(s): indigestion        Medication List     STOP taking these medications    enoxaparin 40 MG/0.4ML injection Commonly known as: LOVENOX       TAKE these medications    acetaminophen 325 MG tablet Commonly known as: TYLENOL Take 2 tablets (650 mg total) by mouth every 6 (six) hours as needed for mild pain (or Fever >/= 101).   calcitonin (salmon) 200 UNIT/ACT nasal spray Commonly known as: MIACALCIN/FORTICAL Place 1 spray into alternate nostrils daily.   docusate sodium 100 MG capsule Commonly known as: COLACE Take 1 capsule (100 mg total) by mouth 2 (two) times daily.   feeding supplement Liqd Take 237 mLs by mouth 2 (two) times daily between meals.   lidocaine 5 % Commonly known as: LIDODERM Place 1-3 patches onto the skin daily. Remove & Discard patch within 12 hours or as directed by MD   oxyCODONE 5 MG immediate release tablet Commonly known as: Oxy IR/ROXICODONE  Take 1 tablet (5 mg total) by mouth every 6 (six) hours as needed for up to 5 days for moderate pain.   polyethylene glycol 17 g packet Commonly known as: MIRALAX / GLYCOLAX Take 17 g by mouth daily as needed for mild constipation or moderate constipation.        Contact information for follow-up providers     Stephanie Collier.          [facility].   Why: they should call you with an appointment Contact information: [facility] at [facility].   Specialty: Emergency Medicine Why: If symptoms worsen Contact information: [address] [id number] [place] [phone]             Contact information for after-discharge care     Destination     [facility] .   Service: Skilled Nursing Contact information: [address] [place] Address [place] [address] [phone]                    Allergies  Allergen Reactions   Fentanyl Shortness Of Breath   Asa [Aspirin]     Per patient stomach cramps   Azithromycin     Other reaction(s): diarrhea   Fosamax [Alendronate]     Other reaction(s): indigestion   Labetalol Hcl     Other reaction(s): indigestion    Consultations: Interventional radiology   Procedures/Studies: IR KYPHO LUMBAR INC FX REDUCE BONE BX UNI/BIL CANNULATION INC/IMAGING  Result Date: 02/11/2022 INDICATION: Severe low back pain secondary to compression fractures at L3 and L4. EXAM: BALLOON KYPHOPLASTY AT L3 AND L4 COMPARISON:  Recent thoracic and lumbosacral MRI scan. MEDICATIONS: As antibiotic prophylaxis, Ancef 2 g IV was ordered pre-procedure and administered intravenously within 1 hour of incision. All current medications are in the EMR and have been reviewed as part of this encounter. ANESTHESIA/SEDATION: Moderate (conscious) sedation was employed during this procedure. A total of Versed 1 mg and 1 mg Dilaudid was administered intravenously by the radiology nurse. Total intra-service moderate Sedation Time: 50 minutes. The patient's level of consciousness and vital signs were monitored continuously by radiology nursing throughout the procedure under my direct supervision. FLUOROSCOPY: Radiation  Exposure Index (as provided by the fluoroscopic device): 21 minutes 1601 mGy Kerma COMPLICATIONS: None immediate. PROCEDURE: Following a full explanation of the procedure along with the potential associated complications, an informed witnessed consent was obtained. The patient was placed prone on the fluoroscopic table. The skin overlying the lumbar region was then prepped and draped in the usual sterile fashion. The right pedicle at L3, and the left pedicle at L4 were then infiltrated with 0.25% bupivacaine followed by the advancement of an 11-gauge Jamshidi needle through each pedicle into the posterior one-third at L3 and L4. These were then exchanged for a Kyphon advanced osteo introducer system comprised of a working cannula and a Kyphon osteo drill at each level. This combination at both levels was then advanced over a Kyphon osteo bone pin until the tip of the Kyphon osteo drill was in the posterior third at L3 and L4. At this time, the bone pin was removed at L3 and L4. In a medial trajectory, the combination was advanced until the tip of the working cannula was inside the posterior one-third at L3 and L4. The osteo drill was removed and a core  sample sent for pathologic analysis from both levels. Through the working cannulae, a Kyphon bone biopsy device was advanced to within 5 mm of the anterior aspect of L3 and L4. A core sample from this was also sent for pathologic analysis. Through the working cannulae, a Kyphon inflatable bone tamp 20 x 3 was advanced and positioned with the distal marker 5 mm from the anterior aspect of L3. Crossing of the midline was seen on the AP projection. At this time, the balloons were expanded using contrast via a Kyphon inflation syringe device via microtubing. Inflations were continued until there was apposition with the superior endplates. At this time, methylmethacrylate mixture was reconstituted with Tobramycin in the Kyphon bone mixing device system. This was then  loaded onto the Kyphon bone fillers. The balloons were deflated and removed followed by the instillation of 4-1/2 bone filler equivalents of methylmethacrylate mixture at each level with excellent filling in the AP and lateral projections. No extravasation was noted in the disk spaces or posteriorly into the spinal canal. No epidural venous contamination was seen. The working cannula and the bone fillers were then retrieved and removed. Hemostasis was achieved at the skin entry sites. Patient tolerated the procedure well. IMPRESSION: 1. Status post fluoroscopic-guided needle placement for deep core bone biopsy at L3 and L4. 2. Status post vertebral body augmentation using balloon kyphoplasty at L3 and L4 as described without event. If the patient has known osteoporosis, recommend treatment as clinically indicated. If the patient's bone density status is unknown, DEXA scan is recommended. Electronically Signed   By: Julieanne Cotton M.D.   On: 02/11/2022 07:54   IR KYPHO EA ADDL LEVEL THORACIC OR LUMBAR  Result Date: 02/11/2022 INDICATION: Severe low back pain secondary to compression fractures at L3 and L4. EXAM: BALLOON KYPHOPLASTY AT L3 AND L4 COMPARISON:  Recent thoracic and lumbosacral MRI scan. MEDICATIONS: As antibiotic prophylaxis, Ancef 2 g IV was ordered pre-procedure and administered intravenously within 1 hour of incision. All current medications are in the EMR and have been reviewed as part of this encounter. ANESTHESIA/SEDATION: Moderate (conscious) sedation was employed during this procedure. A total of Versed 1 mg and 1 mg Dilaudid was administered intravenously by the radiology nurse. Total intra-service moderate Sedation Time: 50 minutes. The patient's level of consciousness and vital signs were monitored continuously by radiology nursing throughout the procedure under my direct supervision. FLUOROSCOPY: Radiation Exposure Index (as provided by the fluoroscopic device): 21 minutes 1108 mGy  Kerma COMPLICATIONS: None immediate. PROCEDURE: Following a full explanation of the procedure along with the potential associated complications, an informed witnessed consent was obtained. The patient was placed prone on the fluoroscopic table. The skin overlying the lumbar region was then prepped and draped in the usual sterile fashion. The right pedicle at L3, and the left pedicle at L4 were then infiltrated with 0.25% bupivacaine followed by the advancement of an 11-gauge Jamshidi needle through each pedicle into the posterior one-third at L3 and L4. These were then exchanged for a Kyphon advanced osteo introducer system comprised of a working cannula and a Kyphon osteo drill at each level. This combination at both levels was then advanced over a Kyphon osteo bone pin until the tip of the Kyphon osteo drill was in the posterior third at L3 and L4. At this time, the bone pin was removed at L3 and L4. In a medial trajectory, the combination was advanced until the tip of the working cannula was inside the posterior one-third at L3  and L4. The osteo drill was removed and a core sample sent for pathologic analysis from both levels. Through the working cannulae, a Kyphon bone biopsy device was advanced to within 5 mm of the anterior aspect of L3 and L4. A core sample from this was also sent for pathologic analysis. Through the working cannulae, a Kyphon inflatable bone tamp 20 x 3 was advanced and positioned with the distal marker 5 mm from the anterior aspect of L3. Crossing of the midline was seen on the AP projection. At this time, the balloons were expanded using contrast via a Kyphon inflation syringe device via microtubing. Inflations were continued until there was apposition with the superior endplates. At this time, methylmethacrylate mixture was reconstituted with Tobramycin in the Kyphon bone mixing device system. This was then loaded onto the Kyphon bone fillers. The balloons were deflated and removed  followed by the instillation of 4-1/2 bone filler equivalents of methylmethacrylate mixture at each level with excellent filling in the AP and lateral projections. No extravasation was noted in the disk spaces or posteriorly into the spinal canal. No epidural venous contamination was seen. The working cannula and the bone fillers were then retrieved and removed. Hemostasis was achieved at the skin entry sites. Patient tolerated the procedure well. IMPRESSION: 1. Status post fluoroscopic-guided needle placement for deep core bone biopsy at L3 and L4. 2. Status post vertebral body augmentation using balloon kyphoplasty at L3 and L4 as described without event. If the patient has known osteoporosis, recommend treatment as clinically indicated. If the patient's bone density status is unknown, DEXA scan is recommended. Electronically Signed   By: Luanne Bras M.D.   On: 02/11/2022 07:54   MR LUMBAR SPINE WO CONTRAST  Result Date: 02/07/2022 CLINICAL DATA:  Low back pain.  Compression fractures EXAM: MRI LUMBAR SPINE WITHOUT CONTRAST TECHNIQUE: Multiplanar, multisequence MR imaging of the lumbar spine was performed. No intravenous contrast was administered. COMPARISON:  CT 02/07/2022, 12/29/2017 FINDINGS: Segmentation:  Standard. Alignment: Lumbar dextrocurvature. Grade 1 anterolisthesis of L5 on S1. Slight retrolisthesis at L1-L2. Vertebrae: Superior endplate compression fracture of the L3 vertebral body with approximately 40% vertebral body height loss. Bone marrow edema throughout the vertebral body compatible with an acute or subacute process. Minimal retropulsion at the superior endplate. No fracture extension into the posterior elements. Superior endplate compression fracture of the L4 vertebral body with approximately 45% vertebral body height loss. Minimal retropulsion at the superior endplate. Faint marrow edema favors a subacute process. The remaining lumbar vertebral body heights are maintained. No  additional fractures. Hyperintense T2/STIR signal within the L2-3 and L3-4 disc spaces are favored to be posttraumatic without secondary findings to suggest discitis. No suspicious marrow replacing bone lesion. Conus medullaris and cauda equina: Conus extends to the L1-2 level. Conus and cauda equina appear normal. Paraspinal and other soft tissues: Known infrarenal abdominal aortic aneurysm and bilateral iliac artery aneurysms, as characterized on same day dedicated CT angiogram. Disc levels: T12-L1: Mild disc bulge without foraminal or canal stenosis. L1-L2: Mild disc bulge and bilateral facet hypertrophy. Mild left foraminal stenosis. No canal stenosis. L2-L3: Mild disc bulge with L3 superior endplate retropulsion. Mild-to-moderate bilateral facet arthropathy. No significant foraminal or canal stenosis. L3-L4: Diffuse disc bulge with mild retropulsion of the L4 superior endplate. Moderate bilateral facet arthropathy. Left greater than right subarticular recess stenosis without canal stenosis. Moderate left and mild right foraminal stenosis. L4-L5: Mild disc bulge with shallow disc protrusion in the right subarticular zone. Moderate facet arthropathy.  No foraminal or canal stenosis. L5-S1: Minimal disc bulge. Bilateral facet arthropathy. No foraminal or canal stenosis. IMPRESSION: 1. Acute-to-subacute superior endplate compression fractures of the L3 and L4 vertebral bodies with minimal retropulsion of both superior endplates. 2. Multilevel degenerative changes of the lumbar spine, as described above. No significant canal stenosis at any level. 3. Moderate left and mild right foraminal stenosis at L3-L4. 4. Known infrarenal abdominal aortic aneurysm and bilateral iliac artery aneurysms, as characterized on same day dedicated CT angiogram. Electronically Signed   By: Duanne GuessNicholas  Plundo D.O.   On: 02/07/2022 09:29   MR THORACIC SPINE WO CONTRAST  Result Date: 02/07/2022 CLINICAL DATA:  87 year old female with mid  back pain. T5 through T7 compression fractures demonstrated on MRI earlier this month. New T10 vertebral body suspected on CT this morning, along with age indeterminate L3 and L4 compression fractures. EXAM: MRI THORACIC SPINE WITHOUT CONTRAST TECHNIQUE: Multiplanar, multisequence MR imaging of the thoracic spine was performed. No intravenous contrast was administered. COMPARISON:  CTA CT Chest, Abdomen, and Pelvis, CT thoracic and lumbar spine 0306 hours today. Recent thoracic MRI 01/14/2022.  Cervical spine CT 11/13/2020. FINDINGS: Limited cervical spine imaging:  Unremarkable for age. Thoracic spine segmentation: Normal by CT this morning, same numbering system used on 01/14/2022. Alignment: Midthoracic kyphosis does not appear significantly changed from earlier this month. Vertebrae: Severe T5, T6 and T7 compression fractures with near vertebra plana and ongoing marrow edema. Patchy new anterior superior endplate marrow edema at both the T4 inferior and T8 superior endplates on series 4, image 11. Vertebral body height at both levels appears maintained. T10 inferior endplate compression fracture with patchy new marrow edema and 25% loss of height since 01/14/2022. Mild retropulsion of the T10 posteroinferior endplate. T10 posterior elements appear intact. Other thoracic levels appears stable and intact. Lumbar vertebrae are reported separately today. Cord: Stable, normal. Capacious thoracic spinal canal at most levels. Conus medullaris is at T12-L1. Paraspinal and other soft tissues: Stable to the CT Chest, Abdomen, and Pelvis this morning. Disc levels: Unchanged thoracic spine degeneration which is generally mild for age. No spinal stenosis, even when considering the mild compression fracture related retropulsion. IMPRESSION: 1. Acute T10 compression fracture with 25% loss of vertebral body height is new since 01/14/2022. No significant retropulsion or other complicating features. 2. Severe T5 through T7  compression fractures (near vertebra plana) with ongoing marrow edema. New anterior endplate marrow edema at the adjacent T4 and T8 levels is favored to be reactive, rather than due to impending collapse. 3. No thoracic spinal stenosis or spinal cord signal abnormality. 4. Lumbar MRI today reported separately. Electronically Signed   By: Odessa FlemingH  Hall M.D.   On: 02/07/2022 09:21   CT L-SPINE NO CHARGE  Result Date: 02/07/2022 CLINICAL DATA:  Back pain. EXAM: CT Thoracic and Lumbar spine without contrast TECHNIQUE: Multiplanar CT images of the thoracic and lumbar spine were reconstructed from contemporary CTA of the Chest, Abdomen, and Pelvis. RADIATION DOSE REDUCTION: This exam was performed according to the departmental dose-optimization program which includes automated exposure control, adjustment of the mA and/or kV according to patient size and/or use of iterative reconstruction technique. CONTRAST:  None or No additional COMPARISON:  Thoracic spine MRI recently 01/14/2022. CTA chest, abdomen and pelvis 12/29/2017. FINDINGS: CT THORACIC SPINE FINDINGS Alignment: There is a nearly 60 degrees kyphotic angle centered at C5-7, mild dextroscoliosis. This was seen previously. Vertebrae: Generalized osteopenia. Moderate to severe subacute compression fractures of T5, T6 and T7 are again  noted, with slight retropulsion. There are mild but increased concavities of the upper and lower plates of the Z61 vertebral body consistent with a mild central compression fracture new from 01/14/2022. The central height loss no more than 20%, without significant anterior or posterior height loss. All pedicles and posterior elements are intact. No aggressive primary bone lesion is seen. Other thoracic vertebrae are preserved in height. There is thoracic spondylosis. Paraspinal and other soft tissues: No paraspinal hematoma. Other soft tissue findings described separately. Disc levels: Disc space loss T5-8 appears similar with otherwise  normal disc heights. Small disc protrusions are again noted and T11-12 and T10-11 and mild disc bulging at T12-L1 and L1-2. There is mild bilateral foraminal stenosis at T7-8 due to the T7 compression fracture. Other foramina appear generally clear despite slight facet spurring at most levels. CT LUMBAR SPINE FINDINGS Segmentation: 5 lumbar type vertebrae. Alignment: Chronic mild-to-moderate dextro rotary scoliosis, apex L2-3. Vertebrae: Osteopenia. There are new age-indeterminate superior endplate compression fractures of L3 and L4, both with slight posterosuperior retropulsion, up to 2 mm retropulsion at L3 and after 3-4 mm retropulsion at L4. There is no bony fragmentation anteriorly in these may be subacute, but there is sclerosis still significant vertebral bodies consistent with a healing response. Loss of vertebral height at L3 is 40-50% anteriorly and posteriorly, 50-60% centrally. Loss of vertebral height at L4 is 50% anteriorly and centrally 30-40% posteriorly. Other vertebrae are normal in heights. No primary bone lesion is seen. Paraspinal and other soft tissues: Enlarging infrarenal AAA, described and reported separately. No paraspinal hematoma. Disc levels: There are normal lumbar disc heights from T12-L1 down. There are mild disc bulges T12-L1 and L1-2, with posterior disc osteophyte complexes L2-3 and L3-4 causing moderate spinal canal stenosis in combination with hypertrophic facet changes. There are mild nonstenosing disc bulges at L4-5 and L5-S1. Facet hypertrophy is seen at all levels but is greatest from L3-4 down. The foramina however, are not significantly stenotic. Other: All pedicles and posterior elements are intact. There is no displaced sacral insufficiency fracture. IMPRESSION: 1. Osteopenia and degenerative change. 2. Moderate to severe subacute compression fractures of T5, T6, and T7 with slight retropulsion. Unchanged from 01/14/2022 MRI. 3. Mild central compression fracture of the  T10 vertebral body, new from 01/14/2022. 4. Age-indeterminate superior endplate compression fractures of L3 and L4, with slight retropulsion at L3 and L4. The gross appearance suggests they are probably nonacute, most likely subacute or chronic. 5. Enlarging infrarenal AAA, described and reported separately. Electronically Signed   By: Almira Bar M.D.   On: 02/07/2022 05:38   CT T-SPINE NO CHARGE  Result Date: 02/07/2022 CLINICAL DATA:  Back pain. EXAM: CT Thoracic and Lumbar spine without contrast TECHNIQUE: Multiplanar CT images of the thoracic and lumbar spine were reconstructed from contemporary CTA of the Chest, Abdomen, and Pelvis. RADIATION DOSE REDUCTION: This exam was performed according to the departmental dose-optimization program which includes automated exposure control, adjustment of the mA and/or kV according to patient size and/or use of iterative reconstruction technique. CONTRAST:  None or No additional COMPARISON:  Thoracic spine MRI recently 01/14/2022. CTA chest, abdomen and pelvis 12/29/2017. FINDINGS: CT THORACIC SPINE FINDINGS Alignment: There is a nearly 60 degrees kyphotic angle centered at C5-7, mild dextroscoliosis. This was seen previously. Vertebrae: Generalized osteopenia. Moderate to severe subacute compression fractures of T5, T6 and T7 are again noted, with slight retropulsion. There are mild but increased concavities of the upper and lower plates of the W96 vertebral  body consistent with a mild central compression fracture new from 01/14/2022. The central height loss no more than 20%, without significant anterior or posterior height loss. All pedicles and posterior elements are intact. No aggressive primary bone lesion is seen. Other thoracic vertebrae are preserved in height. There is thoracic spondylosis. Paraspinal and other soft tissues: No paraspinal hematoma. Other soft tissue findings described separately. Disc levels: Disc space loss T5-8 appears similar with  otherwise normal disc heights. Small disc protrusions are again noted and T11-12 and T10-11 and mild disc bulging at T12-L1 and L1-2. There is mild bilateral foraminal stenosis at T7-8 due to the T7 compression fracture. Other foramina appear generally clear despite slight facet spurring at most levels. CT LUMBAR SPINE FINDINGS Segmentation: 5 lumbar type vertebrae. Alignment: Chronic mild-to-moderate dextro rotary scoliosis, apex L2-3. Vertebrae: Osteopenia. There are new age-indeterminate superior endplate compression fractures of L3 and L4, both with slight posterosuperior retropulsion, up to 2 mm retropulsion at L3 and after 3-4 mm retropulsion at L4. There is no bony fragmentation anteriorly in these may be subacute, but there is sclerosis still significant vertebral bodies consistent with a healing response. Loss of vertebral height at L3 is 40-50% anteriorly and posteriorly, 50-60% centrally. Loss of vertebral height at L4 is 50% anteriorly and centrally 30-40% posteriorly. Other vertebrae are normal in heights. No primary bone lesion is seen. Paraspinal and other soft tissues: Enlarging infrarenal AAA, described and reported separately. No paraspinal hematoma. Disc levels: There are normal lumbar disc heights from T12-L1 down. There are mild disc bulges T12-L1 and L1-2, with posterior disc osteophyte complexes L2-3 and L3-4 causing moderate spinal canal stenosis in combination with hypertrophic facet changes. There are mild nonstenosing disc bulges at L4-5 and L5-S1. Facet hypertrophy is seen at all levels but is greatest from L3-4 down. The foramina however, are not significantly stenotic. Other: All pedicles and posterior elements are intact. There is no displaced sacral insufficiency fracture. IMPRESSION: 1. Osteopenia and degenerative change. 2. Moderate to severe subacute compression fractures of T5, T6, and T7 with slight retropulsion. Unchanged from 01/14/2022 MRI. 3. Mild central compression fracture  of the T10 vertebral body, new from 01/14/2022. 4. Age-indeterminate superior endplate compression fractures of L3 and L4, with slight retropulsion at L3 and L4. The gross appearance suggests they are probably nonacute, most likely subacute or chronic. 5. Enlarging infrarenal AAA, described and reported separately. Electronically Signed   By: Almira Bar M.D.   On: 02/07/2022 05:38   CT Angio Chest/Abd/Pel for Dissection W and/or Wo Contrast  Result Date: 02/07/2022 CLINICAL DATA:  Acute aortic syndrome suspected. History of thoracic aortic stent graft and AAA. EXAM: CT ANGIOGRAPHY CHEST, ABDOMEN AND PELVIS TECHNIQUE: Axial chest CT without contrast was initially obtained. Multidetector CT imaging through the chest, abdomen and pelvis was performed using the standard protocol during bolus administration of intravenous contrast. Multiplanar reconstructed images and MIPs were obtained and reviewed to evaluate the vascular anatomy. RADIATION DOSE REDUCTION: This exam was performed according to the departmental dose-optimization program which includes automated exposure control, adjustment of the mA and/or kV according to patient size and/or use of iterative reconstruction technique. CONTRAST:  75mL OMNIPAQUE IOHEXOL 350 MG/ML SOLN COMPARISON:  CTA chest, abdomen and pelvis 12/29/2017. MRI thoracic spine 01/14/2022. Last chest film was portable 06/03/2021. FINDINGS: CTA CHEST FINDINGS Cardiovascular: There is moderate panchamber cardiomegaly with left chamber predominance. Three-vessel coronary artery calcifications greatest in the LAD. There are calcific plaques in the aorta and scattered calcifications in the great  vessels. The great vessels show tortuosity without stenosis. There is a small increased pericardial effusion. The heart has increased in size since 2019. The superior pulmonary veins are prominent. The pulmonary trunk caliber, ectatic measuring 3.3 cm indicating arterial hypertension. There is no  central embolus. Interval slight dilatation aortic root at the sinuses of Valsalva where the diameter is 4.2 cm previously 4 cm. Stable ectasia in the ascending aorta, measuring 3.9 cm. The arch segment again measures 2.8 cm. A thoracic aortic stent graft again extends from 1.3 cm distal to the left subclavian takeoff down to T10-11. No focal recurrent aneurysm is seen but the descending segment remains ectatic at 3.5 cm. No acute stroke complication is seen, no aortic dissection, with moderate aortic tortuosity. A penetrating ulcer is again noted just beyond the distal graft landing zone along the left lateral wall where the aortic diameter is 3.2 cm, previously 3 cm. Mediastinum/Nodes: No enlarged mediastinal, hilar, or axillary lymph nodes. Thyroid gland is unremarkable. The trachea and central airways are prominent but clear. There is an increasingly patulous appearance and fluid filling in the upper half of the thoracic esophagus. No wall thickening or hiatal hernia. Lungs/Pleura: Diffuse bronchial thickening especially in the lower lobes. Mild chronic elevation right diaphragm. There is chronic change with subpleural reticulation, stable 4 mm right apical nodule on 8:20, stable 3 mm subpleural nodule laterally in the right upper lobe on 8:55. There is a new pleural-based squarish 6 mm noncalcified nodule in the posterior right apex on 8:20. There is increased medial left apical pleural-parenchymal scar-like opacity without a nodule like appearance. There are bilateral minimal pleural effusions not seen previously. No pneumothorax is seen or acute lung infiltrates or edema. Musculoskeletal: Osteopenia. There is thoracic kyphosis and again noted moderate to severe anterior wedge compression fractures of the T5-7 vertebrae, all having marrow edema on the recent MRI but without interval increasing retropulsion. The ribcage is intact. No chest wall masses. Review of the MIP images confirms the above findings. CTA  ABDOMEN AND PELVIS FINDINGS VASCULAR Aorta: The hiatal aortic segment unchanged, measuring 2.8 cm. There is moderate to heavy calcific wall plaque an enlarging infrarenal fusiform AAA now measuring 4.2 x 4.1 cm, was previously 3.3 x 3.3 cm with moderate thrombosis along its wall. No dissection. Celiac: Patent without evidence of aneurysm, dissection, vasculitis or significant stenosis. There are nonstenosing ostial calcifications. The vessel arises from the left lateral aortic wall SMA: Small amount of non stenosing mixed plaque. The vessel is patent without evidence of aneurysm, dissection, vasculitis or significant stenosis. It arises from the left anterolateral wall. Renals: Both are single. Again noted unchanged there is high-grade calcific origin stenosis on the left and no significant stenosis on the right. IMA: Small caliber but without focal stenosis, arises just above the infrarenal AAA. Inflow: Both common iliac arteries are heavily calcified. On the left there is diffuse increased aneurysmal dilatation and increased wall thrombus, maximum diameter now 2.7 cm, previously 2.3 cm. On the right there is diffuse common iliac aneurysmal dilatation, scattered wall thrombus and maximum diameter now 2.2 cm, previously 2.0 cm. No dissection or focal stenosis. The external iliac arteries are tortuous but are relatively clear. Both internal iliac arteries with patchy calcifications without stenosis with vessel tortuosity. 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 Veins: Unopacified and not evaluated Review of the MIP images confirms the above findings. NON-VASCULAR Hepatobiliary: No apparent masses seen. The gallbladder and bile ducts are unremarkable. Pancreas: No significant abnormality. 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 Spleen: No abnormality. Heterogeneous enhancement consistent with bolus phase. Adrenals/Urinary Tract: Scar-like cortical defect posterior left upper pole, unchanged. No adrenal or renal mass enhancement. No urinary stone or obstruction. No bladder thickening is seen. Stomach/Bowel: No dilatation or wall thickening. An appendix is not seen in this patient. There is colonic diverticulosis without evidence of focal diverticulitis. Lymphatic: No adenopathy is seen. Reproductive: Fibroid uterus.  No adnexal mass. Other: No free air, free hemorrhage, free fluid or incarcerated hernia. Musculoskeletal: Age-indeterminate moderate compression fractures of the L3 and 4 vertebral bodies with slight retropulsion new from 2019. There is dextro rotary scoliosis apex at L2-3. Prior left hip nailing. Review of the MIP images confirms the above findings. IMPRESSION: 1. No evidence of acute aortic syndrome. 2. Cardiomegaly with prominent superior pulmonary veins and minimal pleural effusions. No appreciable edema. 3. Chronic lung changes with a new 6 mm posterior right apical nodule. Per Fleischner Society Guidelines, recommend a non-contrast Chest CT at 6-12 months. If patient is high risk for malignancy, consider an additional non-contrast Chest CT at 18-24 months. If patient is low risk for malignancy, non-contrast Chest CT at 18-24 months is optional. These guidelines do not apply to immunocompromised patients and patients with cancer. Follow up in patients with significant comorbidities as clinically warranted. For lung cancer screening, adhere to Lung-RADS guidelines. Reference: Radiology. 2017; 284(1):228-43. 4. Increasingly patulous appearance and fluid filling the upper half of the thoracic esophagus. This could be due to reflux or dysmotility. No wall thickening or hiatal hernia. 5. Dilated aortic root at 4.2 cm, previously 4 cm. Recommend annual imaging followup by CTA or MRA. This  recommendation follows 2010 ACCF/AHA/AATS/ACR/ASA/SCA/SCAI/SIR/STS/SVM Guidelines for the Diagnosis and Management of Patients with Thoracic Aortic Disease. Circulation. 2010; 121: I627-O350. Aortic aneurysm NOS (ICD10-I71.9). Stable ectasia in the ascending aorta, 3.9 cm. 6. 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 7. Increasing size of infrarenal fusiform AAA now 4.2 x 4.1 cm, previously 3.3 x 3.3 cm with moderate thrombosis along its wall. Vascular surgery referral recommended unless already none. 8. 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 9. 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 10. 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 11. Age-indeterminate but subacute moderate compression fractures of T5-7, all having marrow edema on the recent MRI. 12. Age-indeterminate moderate wedge compression fractures of L3 and L4, with slight retropulsion. 13. Remaining findings discussed above. Electronically Signed   By: Almira Bar M.D.   On: 02/07/2022 05:09   MR THORACIC SPINE WO CONTRAST  Result Date: 01/15/2022 CLINICAL DATA:  Chest and back pain with shortness of breath for 2 months. No known injury. EXAM: MRI THORACIC SPINE WITHOUT CONTRAST TECHNIQUE: Multiplanar, multisequence MR imaging of the thoracic spine was performed. No intravenous contrast was administered. COMPARISON:  Limited correlation made with chest CTA 12/29/2017 and cervical spine CT 11/13/2020. FINDINGS: Alignment: Localizing images of the cervical spine demonstrate grossly stable spondylosis with mild chronic anterolisthesis at C6-7 and C7-T1. Exaggerated thoracic kyphosis has mildly progressed without significant listhesis.  Vertebrae: Previous CTA demonstrated a moderate anterior compression  deformity at T6 which has mildly progressed, with greater than 75% loss of vertebral body height anteriorly and mild osseous retropulsion. There is associated mild bone marrow edema. There are new compression fractures at T5 and T7. The T5 fracture is severe with greater than 75% loss of vertebral body height and associated bone marrow edema. The T7 fracture is less severe, resulting in 40% loss of vertebral body height and marrow edema. No other fractures are identified. Cord: The thoracic cord appears normal in signal and caliber.The conus medullaris extends to the L1 level. The thoracic spinal canal appears widely patent. Paraspinal and other soft tissues: Probable mild edema surrounding the midthoracic spine fractures. No epidural fluid collection identified. Trace bilateral pleural effusions. Diffuse dilatation of the descending thoracic aorta status post stent grafting. Disc levels: Relatively mild thoracic spondylosis without high-grade spinal stenosis or nerve root encroachment. Small disc protrusions are noted inferiorly, including a small right paracentral disc protrusion at T10-11, and a small right posterolateral disc protrusion at T11-12. Sagittal images demonstrate mild disc bulging at T12-L1 and L1-2. IMPRESSION: 1. Recent compression fractures at T5 and T7 with associated bone marrow edema, as described. 2. Mildly progressive compression deformity at T6 with mild osseous retropulsion. 3. No significant spinal stenosis or nerve root encroachment. Mild thoracic spondylosis. Electronically Signed   By: Richardean Sale M.D.   On: 01/15/2022 16:36   (Echo, Carotid, EGD, Colonoscopy, ERCP)    Subjective: Patient seen in the morning rounds.  She is ready to get into transport.  Denies any other complaints.  Appropriately anxious about going to skilled nursing facility.  Moderate pain after moving out of bed to the gurney.   Discharge Exam: Vitals:   02/12/22 0430 02/12/22 0813  BP: (!) 152/68 (!)  163/75  Pulse: 72 75  Resp: 16 18  Temp: 97.8 F (36.6 C) (!) 97.5 F (36.4 C)  SpO2: 98% 98%   Vitals:   02/11/22 0813 02/11/22 1559 02/12/22 0430 02/12/22 0813  BP: (!) 159/71 137/64 (!) 152/68 (!) 163/75  Pulse: 79 67 72 75  Resp: 17 17 16 18   Temp: (!) 97.5 F (36.4 C) (!) 97.5 F (36.4 C) 97.8 F (36.6 C) (!) 97.5 F (36.4 C)  TempSrc: Oral Oral  Oral  SpO2: 96% 96% 98% 98%  Weight:      Height:        General: Pt is alert, awake, not in acute distress Frail debilitated.  Not in any distress.  Currently on room air. Cardiovascular: RRR, S1/S2 +, no rubs, no gallops Respiratory: CTA bilaterally, no wheezing, no rhonchi Abdominal: Soft, NT, ND, bowel sounds + Extremities: no edema, no cyanosis    The results of significant diagnostics from this hospitalization (including imaging, microbiology, ancillary and laboratory) are listed below for reference.     Microbiology: Recent Results (from the past 240 hour(s))  Resp panel by RT-PCR (RSV, Flu A&B, Covid) Urine, Clean Catch     Status: None   Collection Time: 02/06/22  9:59 PM   Specimen: Urine, Clean Catch; Nasal Swab  Result Value Ref Range Status   SARS Coronavirus 2 by RT PCR NEGATIVE NEGATIVE Final    Comment: (NOTE) SARS-CoV-2 target nucleic acids are NOT DETECTED.  The SARS-CoV-2 RNA is generally detectable in upper respiratory specimens during the acute phase of infection. The lowest concentration of SARS-CoV-2 viral copies this assay can detect is 138 copies/mL. A negative result does not preclude  SARS-Cov-2 infection and should not be used as the sole basis for treatment or other patient management decisions. A negative result may occur with  improper specimen collection/handling, submission of specimen other than nasopharyngeal swab, presence of viral mutation(s) within the areas targeted by this assay, and inadequate number of viral copies(<138 copies/mL). A negative result must be combined  with clinical observations, patient history, and epidemiological information. The expected result is Negative.  Fact Sheet for Patients:  BloggerCourse.comhttps://www.fda.gov/media/152166/download  Fact Sheet for Healthcare Providers:  SeriousBroker.ithttps://www.fda.gov/media/152162/download  This test is no t yet approved or cleared by the [place] FDA and  has been authorized for detection and/or diagnosis of SARS-CoV-2 by FDA under an Emergency Use Authorization (EUA). This EUA will remain  in effect (meaning this test can be used) for the duration of the COVID-19 declaration under Section 564(b)(1) of the Act, 21 U.S.C.section 360bbb-3(b)(1), unless the authorization is terminated  or revoked sooner.       Influenza A by PCR NEGATIVE NEGATIVE Final   Influenza B by PCR NEGATIVE NEGATIVE Final    Comment: (NOTE) The Xpert Xpress SARS-CoV-2/FLU/RSV plus assay is intended as an aid in the diagnosis of influenza from Nasopharyngeal swab specimens and should not be used as a sole basis for treatment. Nasal washings and aspirates are unacceptable for Xpert Xpress SARS-CoV-2/FLU/RSV testing.  Fact Sheet for Patients: BloggerCourse.comhttps://www.fda.gov/media/152166/download  Fact Sheet for Healthcare Providers: SeriousBroker.ithttps://www.fda.gov/media/152162/download  This test is not yet approved or cleared by the [place] FDA and has been authorized for detection and/or diagnosis of SARS-CoV-2 by FDA under an Emergency Use Authorization (EUA). This EUA will remain in effect (meaning this test can be used) for the duration of the COVID-19 declaration under Section 564(b)(1) of the Act, 21 U.S.C. section 360bbb-3(b)(1), unless the authorization is terminated or revoked.     Resp Syncytial Virus by PCR NEGATIVE NEGATIVE Final    Comment: (NOTE) Fact Sheet for Patients: BloggerCourse.comhttps://www.fda.gov/media/152166/download  Fact Sheet for Healthcare Providers: SeriousBroker.ithttps://www.fda.gov/media/152162/download  This test is not yet approved  or cleared by the [place] FDA and has been authorized for detection and/or diagnosis of SARS-CoV-2 by FDA under an Emergency Use Authorization (EUA). This EUA will remain in effect (meaning this test can be used) for the duration of the COVID-19 declaration under Section 564(b)(1) of the Act, 21 U.S.C. section 360bbb-3(b)(1), unless the authorization is terminated or revoked.  Performed at [facility], [address] [place], [place] [id number]      Labs: BNP (last 3 results) No results for input(s): "BNP" in the last 8760 hours. Basic Metabolic Panel: Recent Labs  Lab 02/06/22 2156 02/06/22 2234 02/08/22 0940 02/10/22 0738  NA 136 138 137 140  K 3.4* 3.7 4.1 3.8  CL 99 97* 105 102  CO2 27  --  22 29  GLUCOSE 126* 126* 87 99  BUN 18 22 11 15   CREATININE 0.59 0.50 0.42* 0.47  CALCIUM 8.7*  --  8.5* 8.4*   Liver Function Tests: Recent Labs  Lab 02/06/22 2156  AST 23  ALT 13  ALKPHOS 68  BILITOT 1.5*  PROT 5.9*  ALBUMIN 3.2*   Recent Labs  Lab 02/06/22 2156  LIPASE 33   No results for input(s): "AMMONIA" in the last 168 hours. CBC: Recent Labs  Lab 02/06/22 2156 02/06/22 2234 02/08/22 0510 02/10/22 0738  WBC 6.3  --  5.7 6.1  NEUTROABS  --   --   --  4.1  HGB 13.7 13.3 12.1 12.1  HCT 42.2 39.0 37.5 38.1  MCV 96.3  --  97.4 97.4  PLT 170  --  149* 167   Cardiac Enzymes: No results for input(s): "CKTOTAL", "CKMB", "CKMBINDEX", "TROPONINI" in the last 168 hours. BNP: Invalid input(s): "POCBNP" CBG: No results for input(s): "GLUCAP" in the last 168 hours. D-Dimer No results for input(s): "DDIMER" in the last 72 hours. Hgb A1c No results for input(s): "HGBA1C" in the last 72 hours. Lipid Profile No results for input(s): "CHOL", "HDL", "LDLCALC", "TRIG", "CHOLHDL", "LDLDIRECT" in the last 72 hours. Thyroid function studies No results for input(s): "TSH", "T4TOTAL", "T3FREE", "THYROIDAB" in the last 72 hours.  Invalid input(s):  "FREET3" Anemia work up No results for input(s): "VITAMINB12", "FOLATE", "FERRITIN", "TIBC", "IRON", "RETICCTPCT" in the last 72 hours. Urinalysis    Component Value Date/Time   COLORURINE YELLOW 02/06/2022 0115   APPEARANCEUR CLEAR 02/06/2022 0115   LABSPEC 1.025 02/06/2022 0115   PHURINE 5.5 02/06/2022 0115   GLUCOSEU NEGATIVE 02/06/2022 0115   HGBUR NEGATIVE 02/06/2022 0115   BILIRUBINUR NEGATIVE 02/06/2022 0115   KETONESUR 15 (A) 02/06/2022 0115   PROTEINUR NEGATIVE 02/06/2022 0115   UROBILINOGEN 0.2 04/03/2014 1335   NITRITE NEGATIVE 02/06/2022 0115   LEUKOCYTESUR NEGATIVE 02/06/2022 0115   Sepsis Labs Recent Labs  Lab 02/06/22 2156 02/08/22 0510 02/10/22 0738  WBC 6.3 5.7 6.1   Microbiology Recent Results (from the past 240 hour(s))  Resp panel by RT-PCR (RSV, Flu A&B, Covid) Urine, Clean Catch     Status: None   Collection Time: 02/06/22  9:59 PM   Specimen: Urine, Clean Catch; Nasal Swab  Result Value Ref Range Status   SARS Coronavirus 2 by RT PCR NEGATIVE NEGATIVE Final    Comment: (NOTE) SARS-CoV-2 target nucleic acids are NOT DETECTED.  The SARS-CoV-2 RNA is generally detectable in upper respiratory specimens during the acute phase of infection. The lowest concentration of SARS-CoV-2 viral copies this assay can detect is 138 copies/mL. A negative result does not preclude SARS-Cov-2 infection and should not be used as the sole basis for treatment or other patient management decisions. A negative result may occur with  improper specimen collection/handling, submission of specimen other than nasopharyngeal swab, presence of viral mutation(s) within the areas targeted by this assay, and inadequate number of viral copies(<138 copies/mL). A negative result must be combined with clinical observations, patient history, and epidemiological information. The expected result is Negative.  Fact Sheet for Patients:  BloggerCourse.com  Fact  Sheet for Healthcare Providers:  SeriousBroker.it  This test is no t yet approved or cleared by the [place] FDA and  has been authorized for detection and/or diagnosis of SARS-CoV-2 by FDA under an Emergency Use Authorization (EUA). This EUA will remain  in effect (meaning this test can be used) for the duration of the COVID-19 declaration under Section 564(b)(1) of the Act, 21 U.S.C.section 360bbb-3(b)(1), unless the authorization is terminated  or revoked sooner.       Influenza A by PCR NEGATIVE NEGATIVE Final   Influenza B by PCR NEGATIVE NEGATIVE Final    Comment: (NOTE) The Xpert Xpress SARS-CoV-2/FLU/RSV plus assay is intended as an aid in the diagnosis of influenza from Nasopharyngeal swab specimens and should not be used as a sole basis for treatment. Nasal washings and aspirates are unacceptable for Xpert Xpress SARS-CoV-2/FLU/RSV testing.  Fact Sheet for Patients: BloggerCourse.com  Fact Sheet for Healthcare Providers: SeriousBroker.it  This test is not yet approved or cleared by the [place] FDA and has been  authorized for detection and/or diagnosis of SARS-CoV-2 by FDA under an Emergency Use Authorization (EUA). This EUA will remain in effect (meaning this test can be used) for the duration of the COVID-19 declaration under Section 564(b)(1) of the Act, 21 U.S.C. section 360bbb-3(b)(1), unless the authorization is terminated or revoked.     Resp Syncytial Virus by PCR NEGATIVE NEGATIVE Final    Comment: (NOTE) Fact Sheet for Patients: BloggerCourse.com  Fact Sheet for Healthcare Providers: SeriousBroker.it  This test is not yet approved or cleared by the [place] FDA and has been authorized for detection and/or diagnosis of SARS-CoV-2 by FDA under an Emergency Use Authorization (EUA). This EUA will remain in effect  (meaning this test can be used) for the duration of the COVID-19 declaration under Section 564(b)(1) of the Act, 21 U.S.C. section 360bbb-3(b)(1), unless the authorization is terminated or revoked.  Performed at [facility], [address], [place], [place] [address]      Time coordinating discharge: 32 minutes  SIGNED:   Dorcas Carrow, MD  [facility] 02/12/2022, 10:14 AM

## 2022-02-15 DIAGNOSIS — R2689 Other abnormalities of gait and mobility: Secondary | ICD-10-CM | POA: Diagnosis not present

## 2022-02-15 DIAGNOSIS — M6281 Muscle weakness (generalized): Secondary | ICD-10-CM | POA: Diagnosis not present

## 2022-02-15 DIAGNOSIS — Z9181 History of falling: Secondary | ICD-10-CM | POA: Diagnosis not present

## 2022-02-16 DIAGNOSIS — J9601 Acute respiratory failure with hypoxia: Secondary | ICD-10-CM | POA: Diagnosis not present

## 2022-02-16 DIAGNOSIS — S72145S Nondisplaced intertrochanteric fracture of left femur, sequela: Secondary | ICD-10-CM | POA: Diagnosis not present

## 2022-02-16 DIAGNOSIS — J9611 Chronic respiratory failure with hypoxia: Secondary | ICD-10-CM | POA: Diagnosis not present

## 2022-02-16 DIAGNOSIS — I509 Heart failure, unspecified: Secondary | ICD-10-CM | POA: Diagnosis not present

## 2022-02-16 DIAGNOSIS — R1310 Dysphagia, unspecified: Secondary | ICD-10-CM | POA: Diagnosis not present

## 2022-03-03 ENCOUNTER — Emergency Department (HOSPITAL_COMMUNITY): Payer: Medicare Other

## 2022-03-03 ENCOUNTER — Encounter (HOSPITAL_COMMUNITY): Payer: Self-pay | Admitting: Internal Medicine

## 2022-03-03 ENCOUNTER — Inpatient Hospital Stay (HOSPITAL_COMMUNITY)
Admission: EM | Admit: 2022-03-03 | Discharge: 2022-03-11 | DRG: 291 | Disposition: A | Discharge status: Home Health | Payer: Medicare Other | Attending: Internal Medicine | Admitting: Internal Medicine

## 2022-03-03 ENCOUNTER — Observation Stay (HOSPITAL_COMMUNITY): Payer: Medicare Other

## 2022-03-03 DIAGNOSIS — I5021 Acute systolic (congestive) heart failure: Secondary | ICD-10-CM | POA: Diagnosis not present

## 2022-03-03 DIAGNOSIS — I5041 Acute combined systolic (congestive) and diastolic (congestive) heart failure: Secondary | ICD-10-CM

## 2022-03-03 DIAGNOSIS — R946 Abnormal results of thyroid function studies: Secondary | ICD-10-CM | POA: Diagnosis not present

## 2022-03-03 DIAGNOSIS — Z1152 Encounter for screening for COVID-19: Secondary | ICD-10-CM | POA: Diagnosis not present

## 2022-03-03 DIAGNOSIS — I429 Cardiomyopathy, unspecified: Secondary | ICD-10-CM | POA: Diagnosis not present

## 2022-03-03 DIAGNOSIS — I700 Atherosclerosis of aorta: Secondary | ICD-10-CM | POA: Diagnosis not present

## 2022-03-03 DIAGNOSIS — G4700 Insomnia, unspecified: Secondary | ICD-10-CM | POA: Diagnosis not present

## 2022-03-03 DIAGNOSIS — Z66 Do not resuscitate: Secondary | ICD-10-CM | POA: Diagnosis present

## 2022-03-03 DIAGNOSIS — I7143 Infrarenal abdominal aortic aneurysm, without rupture: Secondary | ICD-10-CM | POA: Diagnosis present

## 2022-03-03 DIAGNOSIS — S22000A Wedge compression fracture of unspecified thoracic vertebra, initial encounter for closed fracture: Secondary | ICD-10-CM | POA: Diagnosis not present

## 2022-03-03 DIAGNOSIS — M4854XA Collapsed vertebra, not elsewhere classified, thoracic region, initial encounter for fracture: Secondary | ICD-10-CM | POA: Diagnosis present

## 2022-03-03 DIAGNOSIS — R0602 Shortness of breath: Secondary | ICD-10-CM | POA: Diagnosis not present

## 2022-03-03 DIAGNOSIS — R296 Repeated falls: Secondary | ICD-10-CM | POA: Diagnosis present

## 2022-03-03 DIAGNOSIS — Z79899 Other long term (current) drug therapy: Secondary | ICD-10-CM

## 2022-03-03 DIAGNOSIS — J449 Chronic obstructive pulmonary disease, unspecified: Secondary | ICD-10-CM | POA: Diagnosis not present

## 2022-03-03 DIAGNOSIS — I2510 Atherosclerotic heart disease of native coronary artery without angina pectoris: Secondary | ICD-10-CM | POA: Diagnosis present

## 2022-03-03 DIAGNOSIS — E785 Hyperlipidemia, unspecified: Secondary | ICD-10-CM | POA: Diagnosis present

## 2022-03-03 DIAGNOSIS — I110 Hypertensive heart disease with heart failure: Secondary | ICD-10-CM | POA: Diagnosis not present

## 2022-03-03 DIAGNOSIS — I509 Heart failure, unspecified: Secondary | ICD-10-CM | POA: Diagnosis not present

## 2022-03-03 DIAGNOSIS — F419 Anxiety disorder, unspecified: Secondary | ICD-10-CM | POA: Diagnosis present

## 2022-03-03 DIAGNOSIS — R531 Weakness: Secondary | ICD-10-CM | POA: Diagnosis not present

## 2022-03-03 DIAGNOSIS — M4856XA Collapsed vertebra, not elsewhere classified, lumbar region, initial encounter for fracture: Secondary | ICD-10-CM | POA: Diagnosis not present

## 2022-03-03 DIAGNOSIS — I2489 Other forms of acute ischemic heart disease: Secondary | ICD-10-CM | POA: Diagnosis not present

## 2022-03-03 DIAGNOSIS — I10 Essential (primary) hypertension: Secondary | ICD-10-CM | POA: Insufficient documentation

## 2022-03-03 DIAGNOSIS — M810 Age-related osteoporosis without current pathological fracture: Secondary | ICD-10-CM | POA: Diagnosis present

## 2022-03-03 DIAGNOSIS — B370 Candidal stomatitis: Secondary | ICD-10-CM | POA: Diagnosis not present

## 2022-03-03 DIAGNOSIS — Z7401 Bed confinement status: Secondary | ICD-10-CM | POA: Diagnosis not present

## 2022-03-03 DIAGNOSIS — R1310 Dysphagia, unspecified: Secondary | ICD-10-CM | POA: Diagnosis not present

## 2022-03-03 DIAGNOSIS — G8929 Other chronic pain: Secondary | ICD-10-CM | POA: Diagnosis present

## 2022-03-03 DIAGNOSIS — Z515 Encounter for palliative care: Secondary | ICD-10-CM | POA: Diagnosis not present

## 2022-03-03 DIAGNOSIS — R627 Adult failure to thrive: Secondary | ICD-10-CM | POA: Diagnosis present

## 2022-03-03 DIAGNOSIS — E876 Hypokalemia: Secondary | ICD-10-CM | POA: Diagnosis present

## 2022-03-03 DIAGNOSIS — R54 Age-related physical debility: Secondary | ICD-10-CM | POA: Diagnosis present

## 2022-03-03 DIAGNOSIS — Z7189 Other specified counseling: Secondary | ICD-10-CM | POA: Diagnosis not present

## 2022-03-03 DIAGNOSIS — I351 Nonrheumatic aortic (valve) insufficiency: Secondary | ICD-10-CM | POA: Diagnosis not present

## 2022-03-03 DIAGNOSIS — I5020 Unspecified systolic (congestive) heart failure: Secondary | ICD-10-CM

## 2022-03-03 DIAGNOSIS — Z803 Family history of malignant neoplasm of breast: Secondary | ICD-10-CM

## 2022-03-03 DIAGNOSIS — E039 Hypothyroidism, unspecified: Secondary | ICD-10-CM | POA: Diagnosis not present

## 2022-03-03 DIAGNOSIS — K7689 Other specified diseases of liver: Secondary | ICD-10-CM | POA: Diagnosis not present

## 2022-03-03 DIAGNOSIS — R7989 Other specified abnormal findings of blood chemistry: Secondary | ICD-10-CM

## 2022-03-03 DIAGNOSIS — R5381 Other malaise: Secondary | ICD-10-CM | POA: Diagnosis not present

## 2022-03-03 DIAGNOSIS — R630 Anorexia: Secondary | ICD-10-CM

## 2022-03-03 DIAGNOSIS — J42 Unspecified chronic bronchitis: Secondary | ICD-10-CM | POA: Diagnosis not present

## 2022-03-03 DIAGNOSIS — J9811 Atelectasis: Secondary | ICD-10-CM | POA: Diagnosis not present

## 2022-03-03 DIAGNOSIS — R52 Pain, unspecified: Secondary | ICD-10-CM | POA: Diagnosis not present

## 2022-03-03 DIAGNOSIS — I5033 Acute on chronic diastolic (congestive) heart failure: Secondary | ICD-10-CM | POA: Diagnosis not present

## 2022-03-03 DIAGNOSIS — R0781 Pleurodynia: Secondary | ICD-10-CM | POA: Diagnosis not present

## 2022-03-03 LAB — BASIC METABOLIC PANEL
Anion gap: 10 (ref 5–15)
BUN: 17 mg/dL (ref 8–23)
CO2: 25 mmol/L (ref 22–32)
Calcium: 9.2 mg/dL (ref 8.9–10.3)
Chloride: 101 mmol/L (ref 98–111)
Creatinine, Ser: 0.7 mg/dL (ref 0.44–1.00)
GFR, Estimated: >60 mL/min (ref >60)
Glucose, Bld: 123 mg/dL — ABNORMAL HIGH (ref 70–99)
Potassium: 3.5 mmol/L (ref 3.5–5.1)
Sodium: 136 mmol/L (ref 135–145)

## 2022-03-03 LAB — RESP PANEL BY RT-PCR (RSV, FLU A&B, COVID)  RVPGX2
Influenza A by PCR: NEGATIVE
Influenza B by PCR: NEGATIVE
Resp Syncytial Virus by PCR: NEGATIVE
SARS Coronavirus 2 by RT PCR: NEGATIVE

## 2022-03-03 LAB — URINALYSIS, ROUTINE W REFLEX MICROSCOPIC
Bacteria, UA: NONE SEEN
Bilirubin Urine: NEGATIVE
Glucose, UA: NEGATIVE mg/dL
Hgb urine dipstick: NEGATIVE
Ketones, ur: NEGATIVE mg/dL
Leukocytes,Ua: NEGATIVE
Nitrite: NEGATIVE
Protein, ur: NEGATIVE mg/dL
Specific Gravity, Urine: 1.012 (ref 1.005–1.030)
pH: 6 (ref 5.0–8.0)

## 2022-03-03 LAB — BRAIN NATRIURETIC PEPTIDE: B Natriuretic Peptide: 1707.4 pg/mL — ABNORMAL HIGH (ref 0.0–100.0)

## 2022-03-03 LAB — CBC
HCT: 43 % (ref 36.0–46.0)
Hemoglobin: 13.5 g/dL (ref 12.0–15.0)
MCH: 30.5 pg (ref 26.0–34.0)
MCHC: 31.4 g/dL (ref 30.0–36.0)
MCV: 97.1 fL (ref 80.0–100.0)
Platelets: 159 10*3/uL (ref 150–400)
RBC: 4.43 MIL/uL (ref 3.87–5.11)
RDW: 14.8 % (ref 11.5–15.5)
WBC: 5.2 10*3/uL (ref 4.0–10.5)
nRBC: 0 % (ref 0.0–0.2)

## 2022-03-03 LAB — TSH: TSH: 7.095 u[IU]/mL — ABNORMAL HIGH (ref 0.350–4.500)

## 2022-03-03 LAB — TROPONIN I (HIGH SENSITIVITY): Troponin I (High Sensitivity): 54 ng/L — ABNORMAL HIGH (ref <18)

## 2022-03-03 MED ORDER — NYSTATIN 100000 UNIT/ML MT SUSP
5.0000 mL | Freq: Four times a day (QID) | OROMUCOSAL | Status: DC
  Administered 2022-03-03 – 2022-03-11 (×10): 500000 [IU] via ORAL
  Filled 2022-03-03 (×18): qty 5

## 2022-03-03 MED ORDER — IPRATROPIUM-ALBUTEROL 0.5-2.5 (3) MG/3ML IN SOLN: 3.0000 mL | Freq: Four times a day (QID) | RESPIRATORY_TRACT | Status: DC | PRN

## 2022-03-03 MED ORDER — HYDRALAZINE HCL 20 MG/ML IJ SOLN: 5.0000 mg | Freq: Three times a day (TID) | INTRAMUSCULAR | Status: DC | PRN

## 2022-03-03 MED ORDER — FUROSEMIDE 10 MG/ML IJ SOLN
40.0000 mg | Freq: Once | INTRAMUSCULAR | Status: AC
  Administered 2022-03-03: 40 mg via INTRAVENOUS
  Filled 2022-03-03: qty 4

## 2022-03-03 MED ORDER — NALOXONE HCL 0.4 MG/ML IJ SOLN: 0.4000 mg | INTRAMUSCULAR | Status: DC | PRN

## 2022-03-03 MED ORDER — IOHEXOL 350 MG/ML SOLN
75.0000 mL | Freq: Once | INTRAVENOUS | Status: AC | PRN
  Administered 2022-03-03: 75 mL via INTRAVENOUS

## 2022-03-03 MED ORDER — OXYCODONE HCL 5 MG PO TABS: 5.0000 mg | ORAL_TABLET | Freq: Once | ORAL | Status: DC | PRN

## 2022-03-03 MED ORDER — LIDOCAINE 5 % EX PTCH
1.0000 | MEDICATED_PATCH | CUTANEOUS | Status: DC
  Administered 2022-03-03 – 2022-03-10 (×7): 1 via TRANSDERMAL
  Filled 2022-03-03 (×8): qty 1

## 2022-03-03 NOTE — ED Notes (Signed)
Assisted pt to the stretcher pt was was able to assist herself to the stretcher with my assist

## 2022-03-03 NOTE — ED Provider Triage Note (Signed)
Emergency Medicine Provider Triage Evaluation Note  Stephanie Collier , a 87 y.o. female  was evaluated in triage.  Pt complains of vague complaints, multiple.  Patient reports that she was recently admitted to the hospital and then discharged to a SNF.  Patient reports that she stayed at the SNF for maybe 2 to 3 days and then left for unknown reason.  Patient reports that she currently is with her daughter, her daughter dropped her off here.  Patient states that she has been unable to eat, has had no appetite, ever since being discharged from the SNF.  Patient denies any nausea, vomiting, fevers, abdominal pain or distention.  Patient reports that she can sometimes eat a few bites of oatmeal, other things however has no real appetite.  Patient states that she has had some shortness of breath, some orthopnea for "maybe 1 or 2 months" however denies any CHF, taking fluid pill.  Review of Systems  Positive:  Negative:   Physical Exam  BP (!) 157/75 (BP Location: Right Arm)   Pulse 89   Temp (!) 97.2 F (36.2 C)   Resp 16   SpO2 100%  Gen:   Awake, no distress   Resp:  Normal effort  MSK:   Moves extremities without difficulty  Other:  No edema  Medical Decision Making  Medically screening exam initiated at 12:37 PM.  Appropriate orders placed.  Olwen Rugg was informed that the remainder of the evaluation will be completed by another provider, this initial triage assessment does not replace that evaluation, and the importance of remaining in the ED until their evaluation is complete.     Azucena Cecil, PA-C 03/03/22 1239

## 2022-03-03 NOTE — H&P (Signed)
History and Physical    Stephanie Collier [id number] DOB: 20-Oct-1935 DOA: 03/03/2022  PCP: [facility], [place]  Patient coming from: Home  Chief Complaint: Generalized weakness  HPI: Stephanie Collier is a 87 y.o. female with medical history significant of hypertension, thoracic aortic aneurysm, COPD, Raynaud's disease, osteoporosis, scoliosis.  Recently admitted 1/27-2/2 for intractable back pain secondary to multiple vertebral compression fractures.  Underwent image guided kyphoplasty L3 and L4 by IR.  Required dysphagia 3 diet during this hospitalization.  She presents to the ED today complaining of generalized weakness, loss of appetite, and shortness of breath.  Slightly tachypneic in the ED but not hypoxic, satting 99-100% on room air.  Afebrile.  Labs showing no leukocytosis or anemia, glucose 123, BUN 17, creatinine 0.7, BNP 1707, COVID/influenza/RSV PCR negative,  UA pending, troponin 54, TSH 7.095.    CT angiogram chest: "IMPRESSION: 1. No pulmonary emboli. 2. Chronic cardiomegaly. Coronary artery calcification. Aortic stent without visible complicating feature. 3. Chronic upper lobe pulmonary scarring. 4. Thoracic compression fractures at T5, T6 and T7 appear the same as they did on the prior exam. There has been progression of compression fractures at T8 and T10.   Aortic Atherosclerosis (ICD10-I70.0)."  Patient was given IV Lasix 40 mg.  TRH called to admit.  Patient states she has lost her appetite for the past 2 weeks.  She is also having diarrhea on and off but has been taking a stool softener at home.  Denies recent antibiotic use.  She is a little nauseous at times but has not vomited.  Denies abdominal pain.  She ate a few bites of oatmeal yesterday morning and has had nothing to eat since then as she does not feel like eating.  Denies difficulty swallowing.  She is also endorsing orthopnea and bilateral lower extremity edema.  Denies chest pain.  No other  complaints.  Review of Systems:  Review of Systems  All other systems reviewed and are negative.   Past Medical History:  Diagnosis Date   AAA (abdominal aortic aneurysm) (HCC)    Arthritis    hip   Hypertension    no meds now   Osteoporosis    Patellar fracture    left   Raynaud's disease    Scoliosis     Past Surgical History:  Procedure Laterality Date   APPENDECTOMY     at age 49   BREAST BIOPSY Left    CHEST TUBE INSERTION Left 04/04/2014   Procedure: CHEST TUBE INSERTION;  Surgeon: Serafina Mitchell, MD;  Location: [facility];  Service: Vascular;  Laterality: Left;  by Dr. Vernetta Honey   INSERTION OF VENA CAVA FILTER N/A 04/02/2014   Procedure: INSERTION OF VENA CAVA FILTER;  Surgeon: Serafina Mitchell, MD;  Location: [facility];  Service: Cardiovascular;  Laterality: N/A;   INTRAMEDULLARY (IM) NAIL INTERTROCHANTERIC Left 06/04/2021   Procedure: INTRAMEDULLARY (IM) NAIL INTERTROCHANTRIC;  Surgeon: Georgeanna Harrison, MD;  Location: [facility];  Service: Orthopedics;  Laterality: Left;   IR KYPHO EA ADDL LEVEL THORACIC OR LUMBAR  02/10/2022   IR KYPHO LUMBAR INC FX REDUCE BONE BX UNI/BIL CANNULATION INC/IMAGING  02/10/2022   ORIF PATELLA Left 11/20/2020   Procedure: OPEN REDUCTION INTERNAL (ORIF) FIXATION PATELLA;  Surgeon: Willaim Sheng, MD;  Location: [facility];  Service: Orthopedics;  Laterality: Left;   PERIPHERAL VASCULAR CATHETERIZATION N/A 06/26/2014   Procedure: IVC Filter Removal;  Surgeon: Serafina Mitchell, MD;  Location: [facility];  Service: Cardiovascular;  Laterality: N/A;   THORACIC AORTIC ENDOVASCULAR STENT GRAFT N/A 04/04/2014   Procedure: THORACIC AORTIC ENDOVASCULAR STENT GRAFT;  Surgeon: Serafina Mitchell, MD;  Location: [facility];  Service: Vascular;  Laterality: N/A;     reports that she has never smoked. She has never used smokeless tobacco. She reports that she does not currently use alcohol. She reports that she does not use drugs.  Allergies   Allergen Reactions   Fentanyl Shortness Of Breath   Asa [Aspirin]     Per patient stomach cramps   Azithromycin     Other reaction(s): diarrhea   Fosamax [Alendronate]     Other reaction(s): indigestion   Labetalol Hcl     Other reaction(s): indigestion    Family History  Problem Relation Age of Onset   Breast cancer Daughter 69    Prior to Admission medications   Medication Sig Start Date End Date Taking? Authorizing Provider  acetaminophen (TYLENOL) 325 MG tablet Take 2 tablets (650 mg total) by mouth every 6 (six) hours as needed for mild pain (or Fever >/= 101). 02/12/22   Barb Merino, MD  calcitonin, salmon, (MIACALCIN/FORTICAL) 200 UNIT/ACT nasal spray Place 1 spray into alternate nostrils daily. 02/12/22   Barb Merino, MD  docusate sodium (COLACE) 100 MG capsule Take 1 capsule (100 mg total) by mouth 2 (two) times daily. 02/12/22   Barb Merino, MD  feeding supplement (ENSURE ENLIVE / ENSURE PLUS) LIQD Take 237 mLs by mouth 2 (two) times daily between meals. 02/12/22   Barb Merino, MD  lidocaine (LIDODERM) 5 % Place 1-3 patches onto the skin daily. Remove & Discard patch within 12 hours or as directed by MD 02/12/22   Barb Merino, MD  polyethylene glycol (MIRALAX / GLYCOLAX) 17 g packet Take 17 g by mouth daily as needed for mild constipation or moderate constipation. 02/12/22   Barb Merino, MD    Physical Exam: Vitals:   03/03/22 1850 03/03/22 1851 03/03/22 1852 03/03/22 1853  BP:  (!) 155/85    Pulse: 99  97 99  Resp: (!) 23 (!) 26  (!) 24  Temp:      TempSrc:      SpO2: 100%  99% 99%    Physical Exam Vitals reviewed.  Constitutional:      General: She is not in acute distress. HENT:     Head: Normocephalic and atraumatic.     Mouth/Throat:     Comments: Oral thrush Eyes:     Extraocular Movements: Extraocular movements intact.  Cardiovascular:     Rate and Rhythm: Normal rate and regular rhythm.     Pulses: Normal pulses.  Pulmonary:     Effort: No  respiratory distress.     Breath sounds: Normal breath sounds. No wheezing or rales.     Comments: Slightly tachypneic Abdominal:     General: Bowel sounds are normal. There is no distension.     Palpations: Abdomen is soft. There is mass.     Tenderness: There is no abdominal tenderness.     Comments: Mass palpated in the right upper quadrant/epigastric region  Musculoskeletal:     Cervical back: Normal range of motion.     Right lower leg: Edema present.     Left lower leg: Edema present.     Comments: +1 pitting edema of bilateral lower legs  Skin:    General: Skin is warm and dry.  Neurological:     General: No focal deficit present.  Mental Status: She is alert and oriented to person, place, and time.     Labs on Admission: I have personally reviewed following labs and imaging studies  CBC: Recent Labs  Lab 03/03/22 1243  WBC 5.2  HGB 13.5  HCT 43.0  MCV 97.1  PLT [id number]   Basic Metabolic Panel: Recent Labs  Lab 03/03/22 1243  NA 136  K 3.5  CL 101  CO2 25  GLUCOSE 123*  BUN 17  CREATININE 0.70  CALCIUM 9.2   GFR: CrCl cannot be calculated (Unknown ideal weight.). Liver Function Tests: No results for input(s): "AST", "ALT", "ALKPHOS", "BILITOT", "PROT", "ALBUMIN" in the last 168 hours. No results for input(s): "LIPASE", "AMYLASE" in the last 168 hours. No results for input(s): "AMMONIA" in the last 168 hours. Coagulation Profile: No results for input(s): "INR", "PROTIME" in the last 168 hours. Cardiac Enzymes: No results for input(s): "CKTOTAL", "CKMB", "CKMBINDEX", "TROPONINI" in the last 168 hours. BNP (last 3 results) No results for input(s): "PROBNP" in the last 8760 hours. HbA1C: No results for input(s): "HGBA1C" in the last 72 hours. CBG: No results for input(s): "GLUCAP" in the last 168 hours. Lipid Profile: No results for input(s): "CHOL", "HDL", "LDLCALC", "TRIG", "CHOLHDL", "LDLDIRECT" in the last 72 hours. Thyroid Function Tests: Recent  Labs    03/03/22 1652  TSH 7.095*   Anemia Panel: No results for input(s): "VITAMINB12", "FOLATE", "FERRITIN", "TIBC", "IRON", "RETICCTPCT" in the last 72 hours. Urine analysis:    Component Value Date/Time   COLORURINE YELLOW 02/06/2022 0115   APPEARANCEUR CLEAR 02/06/2022 0115   LABSPEC 1.025 02/06/2022 0115   PHURINE 5.5 02/06/2022 0115   GLUCOSEU NEGATIVE 02/06/2022 0115   HGBUR NEGATIVE 02/06/2022 0115   BILIRUBINUR NEGATIVE 02/06/2022 0115   KETONESUR 15 (A) 02/06/2022 0115   PROTEINUR NEGATIVE 02/06/2022 0115   UROBILINOGEN 0.2 04/03/2014 1335   NITRITE NEGATIVE 02/06/2022 0115   LEUKOCYTESUR NEGATIVE 02/06/2022 0115    Radiological Exams on Admission: CT Angio Chest PE W and/or Wo Contrast  Result Date: 03/03/2022 CLINICAL DATA:  Pulmonary embolism suspected. High probability. Recent lumbar surgery. EXAM: CT ANGIOGRAPHY CHEST WITH CONTRAST TECHNIQUE: Multidetector CT imaging of the chest was performed using the standard protocol during bolus administration of intravenous contrast. Multiplanar CT image reconstructions and MIPs were obtained to evaluate the vascular anatomy. RADIATION DOSE REDUCTION: This exam was performed according to the departmental dose-optimization program which includes automated exposure control, adjustment of the mA and/or kV according to patient size and/or use of iterative reconstruction technique. CONTRAST:  57m OMNIPAQUE IOHEXOL 350 MG/ML SOLN COMPARISON:  02/07/2022 FINDINGS: Cardiovascular: Chronic cardiomegaly. Coronary artery calcification. Aortic atherosclerotic calcification. Thoracic aortic stent without complicating feature. This is not opacified by contrast as this exam is targeted towards the pulmonary arteries. Pulmonary arterial opacification is good. No pulmonary emboli. Mediastinum/Nodes: No mass or adenopathy. Lungs/Pleura: Chronic upper lobe pulmonary scarring. Minimal dependent atelectasis. No pleural effusion. No sign of pneumonia or  mass lesion. Upper Abdomen: Negative Musculoskeletal: Thoracic compression fractures at T5, T6 and T7 appear the same as they did on the prior exam. There has been progression of compression fractures at T8 and T10. Review of the MIP images confirms the above findings. IMPRESSION: 1. No pulmonary emboli. 2. Chronic cardiomegaly. Coronary artery calcification. Aortic stent without visible complicating feature. 3. Chronic upper lobe pulmonary scarring. 4. Thoracic compression fractures at T5, T6 and T7 appear the same as they did on the prior exam. There has been progression of compression fractures at T8  and T10. Aortic Atherosclerosis (ICD10-I70.0). Electronically Signed   By: Nelson Chimes M.D.   On: 03/03/2022 18:21   DG Chest 2 View  Result Date: 03/03/2022 CLINICAL DATA:  Shortness of breath. EXAM: CHEST - 2 VIEW COMPARISON:  Chest radiograph 06/03/2021 and CTA 02/07/2022 FINDINGS: The cardiac silhouette remains enlarged. A descending thoracic aortic stent graft is again noted. There is chronic interstitial coarsening. Increased, mild densities in the lung bases compared to the prior radiograph may reflect atelectasis and possibly trace pleural effusions. No pneumothorax is identified. Thoracolumbar scoliosis and multiple compression fractures are again noted. IMPRESSION: Increased bibasilar opacities which may reflect atelectasis and possible trace pleural effusions. Electronically Signed   By: Logan Bores M.D.   On: 03/03/2022 13:16    EKG: Independently reviewed.  Sinus rhythm, LVH, mild ST changes in V3 and V4.  T wave inversions in inferior and lateral leads from prior EKG May 2023 are no longer seen.  Assessment and Plan  Acute CHF Patient presenting with complaints of orthopnea and bilateral lower extremity edema.  BNP 1707.  She is slightly tachypneic but not hypoxic.  CTA chest negative for PE or pulmonary edema but does show chronic cardiomegaly. Echo done in May 2023 showing EF 60 to 65%,  grade 1 diastolic dysfunction, trivial MVR, moderate AVR.  Patient received IV Lasix 40 mg in the ED.  Will repeat echocardiogram in the morning.  Monitor intake and output, daily weights, dietary sodium and fluid restriction.  Anorexia No vomiting or abdominal pain.  Patient reported some episodes of diarrhea at home but was taking a stool softener.  Denies recent antibiotic use.  No fever or leukocytosis.  Abdominal mass palpated on exam in the epigastric/right upper quadrant region.  CT abdomen pelvis has been ordered for further evaluation.  Check LFTs.  Elevated troponin Likely due to demand ischemia in the setting of acute CHF.  EKG showing mild ST changes in V3 and V4.  Troponin mildly elevated at 54 but patient is not endorsing chest pain.  Continue cardiac monitoring and trend troponin.  Oral thrush Nystatin oral suspension.  Recent multiple vertebral compression fractures Recently admitted 1/27-2/2 for intractable back pain secondary to multiple vertebral compression fractures.  Underwent image guided kyphoplasty L3 and L4 by IR.  CT chest done today showing thoracic compression fractures at T5, T6, and T7 which appears stable but there is progression of compression fractures of T8 and T10.  Continue pain management.  Abnormal thyroid function test TSH elevated at 7.095.  Free T3/T4 levels pending.  Hypertension Not on medications at home.  Continue pain management, hydralazine PRN.   COPD No signs of acute exacerbation.  DuoNeb as needed.  DVT prophylaxis: SCDs.  Avoiding chemical DVT prophylaxis until CT abdomen pelvis is done. Code Status: DNR/DNI (discussed with the patient and her daughter) Family Communication: Daughter at bedside. Level of care: Telemetry bed Admission status: It is my clinical opinion that referral for OBSERVATION is reasonable and necessary in this patient based on the above information provided. The aforementioned taken together are felt to place the  patient at high risk for further clinical deterioration. However, it is anticipated that the patient may be medically stable for discharge from the hospital within 24 to 48 hours.   Shela Leff MD [facility]  If 7PM-7AM, please contact night-coverage [email or web address]  03/03/2022, 7:49 PM

## 2022-03-03 NOTE — ED Provider Notes (Signed)
[facility] Provider Note   CSN: [id number] Arrival date & time: 03/03/22  1224     History  Chief Complaint  Patient presents with   Anorexia    [place] is a 87 y.o. female.  The history is provided by the patient and medical records. No language interpreter was used.     Patient is a 87 year-old female presenting to the ED with decrease appetite. Patient states she had back surgery about two weeks ago. Since the surgery patient has been feeling SOB, loss of appetite, diarrhea, and decrease fluid intake due to feeling nauseous. Patient states she was Rx Tramadol but quit taking it a few days after because it wasn't help with the pain. Patient reports trouble sleeping with some SOB. She did not have this issue prior to the surgery. Patient has not been very active and reports feeling weak. Patient denies feeling congested, chest pain, or vomiting. Patient is currently living with her daughter.   Home Medications Prior to Admission medications   Medication Sig Start Date End Date Taking? Authorizing Provider  acetaminophen (TYLENOL) 325 MG tablet Take 2 tablets (650 mg total) by mouth every 6 (six) hours as needed for mild pain (or Fever >/= 101). 02/12/22   Barb Merino, MD  calcitonin, salmon, (MIACALCIN/FORTICAL) 200 UNIT/ACT nasal spray Place 1 spray into alternate nostrils daily. 02/12/22   Barb Merino, MD  docusate sodium (COLACE) 100 MG capsule Take 1 capsule (100 mg total) by mouth 2 (two) times daily. 02/12/22   Barb Merino, MD  feeding supplement (ENSURE ENLIVE / ENSURE PLUS) LIQD Take 237 mLs by mouth 2 (two) times daily between meals. 02/12/22   Barb Merino, MD  lidocaine (LIDODERM) 5 % Place 1-3 patches onto the skin daily. Remove & Discard patch within 12 hours or as directed by MD 02/12/22   Barb Merino, MD  polyethylene glycol (MIRALAX / GLYCOLAX) 17 g packet Take 17 g by mouth daily as needed for mild constipation or  moderate constipation. 02/12/22   Barb Merino, MD      Allergies    Fentanyl, Asa [aspirin], Azithromycin, Fosamax [alendronate], and Labetalol hcl    Review of Systems   Review of Systems  All other systems reviewed and are negative.   Physical Exam Updated Vital Signs BP (!) 157/75 (BP Location: Right Arm)   Pulse 89   Temp (!) 97.2 F (36.2 C)   Resp 16   SpO2 100%  Physical Exam Vitals and nursing note reviewed.  Constitutional:      General: She is not in acute distress.    Appearance: She is well-developed.     Comments: Frail-appearing elderly female laying in bed appears mildly tachypneic.  HENT:     Head: Atraumatic.  Eyes:     Conjunctiva/sclera: Conjunctivae normal.  Cardiovascular:     Rate and Rhythm: Normal rate and regular rhythm.  Pulmonary:     Effort: Pulmonary effort is normal.     Breath sounds: Rales present. No wheezing or rhonchi.  Abdominal:     Palpations: Abdomen is soft.     Tenderness: There is no abdominal tenderness.  Musculoskeletal:     Cervical back: Neck supple.     Right lower leg: Edema present.     Left lower leg: Edema present.  Skin:    Findings: No rash.  Neurological:     Mental Status: She is alert. Mental status is at baseline.  Psychiatric:  Mood and Affect: Mood normal.     ED Results / Procedures / Treatments   Labs (all labs ordered are listed, but only abnormal results are displayed) Labs Reviewed  BASIC METABOLIC PANEL - Abnormal; Notable for the following components:      Result Value   Glucose, Bld 123 (*)    All other components within normal limits  BRAIN NATRIURETIC PEPTIDE - Abnormal; Notable for the following components:   B Natriuretic Peptide 1,707.4 (*)    All other components within normal limits  TSH - Abnormal; Notable for the following components:   TSH 7.095 (*)    All other components within normal limits  TROPONIN I (HIGH SENSITIVITY) - Abnormal; Notable for the following  components:   Troponin I (High Sensitivity) 54 (*)    All other components within normal limits  RESP PANEL BY RT-PCR (RSV, FLU A&B, COVID)  [id number]  CBC  URINALYSIS, ROUTINE W REFLEX MICROSCOPIC  VITAMIN B1  T3, FREE  T4, FREE  TROPONIN I (HIGH SENSITIVITY)    EKG None ED ECG REPORT   Date: 03/03/2022  Rate: 86  Rhythm: normal sinus rhythm  QRS Axis: left  Intervals: normal  ST/T Wave abnormalities: nonspecific T wave changes  Conduction Disutrbances:none  Narrative Interpretation:   Old EKG Reviewed: unchanged  I have personally reviewed the EKG tracing and agree with the computerized printout as noted.   Radiology CT Angio Chest PE W and/or Wo Contrast  Result Date: 03/03/2022 CLINICAL DATA:  Pulmonary embolism suspected. High probability. Recent lumbar surgery. EXAM: CT ANGIOGRAPHY CHEST WITH CONTRAST TECHNIQUE: Multidetector CT imaging of the chest was performed using the standard protocol during bolus administration of intravenous contrast. Multiplanar CT image reconstructions and MIPs were obtained to evaluate the vascular anatomy. RADIATION DOSE REDUCTION: This exam was performed according to the departmental dose-optimization program which includes automated exposure control, adjustment of the mA and/or kV according to patient size and/or use of iterative reconstruction technique. CONTRAST:  46m OMNIPAQUE IOHEXOL 350 MG/ML SOLN COMPARISON:  02/07/2022 FINDINGS: Cardiovascular: Chronic cardiomegaly. Coronary artery calcification. Aortic atherosclerotic calcification. Thoracic aortic stent without complicating feature. This is not opacified by contrast as this exam is targeted towards the pulmonary arteries. Pulmonary arterial opacification is good. No pulmonary emboli. Mediastinum/Nodes: No mass or adenopathy. Lungs/Pleura: Chronic upper lobe pulmonary scarring. Minimal dependent atelectasis. No pleural effusion. No sign of pneumonia or mass lesion. Upper Abdomen: Negative  Musculoskeletal: Thoracic compression fractures at T5, T6 and T7 appear the same as they did on the prior exam. There has been progression of compression fractures at T8 and T10. Review of the MIP images confirms the above findings. IMPRESSION: 1. No pulmonary emboli. 2. Chronic cardiomegaly. Coronary artery calcification. Aortic stent without visible complicating feature. 3. Chronic upper lobe pulmonary scarring. 4. Thoracic compression fractures at T5, T6 and T7 appear the same as they did on the prior exam. There has been progression of compression fractures at T8 and T10. Aortic Atherosclerosis (ICD10-I70.0). Electronically Signed   By: MNelson ChimesM.D.   On: 03/03/2022 18:21   DG Chest 2 View  Result Date: 03/03/2022 CLINICAL DATA:  Shortness of breath. EXAM: CHEST - 2 VIEW COMPARISON:  Chest radiograph 06/03/2021 and CTA 02/07/2022 FINDINGS: The cardiac silhouette remains enlarged. A descending thoracic aortic stent graft is again noted. There is chronic interstitial coarsening. Increased, mild densities in the lung bases compared to the prior radiograph may reflect atelectasis and possibly trace pleural effusions. No pneumothorax is identified. Thoracolumbar scoliosis  and multiple compression fractures are again noted. IMPRESSION: Increased bibasilar opacities which may reflect atelectasis and possible trace pleural effusions. Electronically Signed   By: Logan Bores M.D.   On: 03/03/2022 13:16    Procedures .Critical Care  Performed by: Domenic Moras, PA-C Authorized by: Domenic Moras, PA-C   Critical care provider statement:    Critical care time (minutes):  46   Critical care was time spent personally by me on the following activities:  Development of treatment plan with patient or surrogate, discussions with consultants, evaluation of patient's response to treatment, examination of patient, ordering and review of laboratory studies, ordering and review of radiographic studies, ordering and  performing treatments and interventions, pulse oximetry, re-evaluation of patient's condition and review of old charts     Medications Ordered in ED Medications  furosemide (LASIX) injection 40 mg (40 mg Intravenous Given 03/03/22 1645)  iohexol (OMNIPAQUE) 350 MG/ML injection 75 mL (75 mLs Intravenous Contrast Given 03/03/22 1812)    ED Course/ Medical Decision Making/ A&P                             Medical Decision Making Amount and/or Complexity of Data Reviewed Labs: ordered. Radiology: ordered. ECG/medicine tests: ordered.  Risk Prescription drug management.   BP (!) 157/75 (BP Location: Right Arm)   Pulse 89   Temp (!) 97.2 F (36.2 C)   Resp 16   SpO2 100%   77:80 PM 87 year old female significant history of AAA, PVD, hypertension who presents complaining of lack of appetite.  Patient reports 2 weeks ago she had surgical repair of her spinal compression fracture she has been dealing with since November of last year.  She mention she was discharged home to a skilled nursing facility stated for few days and went home to stay with her daughter.  Since the surgery she still endorse having lower back pain for which she takes Tylenol and occasionally tramadol for it.  However she states she stopped taking the tramadol because it caused her legs to swell.  Furthermore she also endorsed having shortness of breath, waking up at night and having shortness of breath as well as having loss of appetite.  States she does not have the urge to eat or drink much.  She reports she is becoming progressively more weak, mostly bedbound, not eating and drinking much and now having shortness of breath.  She does not endorse any fever chills no runny nose sneezing or coughing no hemoptysis no chest pain no abdominal pain no urinary symptoms no bowel bladder incontinence or saddle anesthesia.  On exam this is an elderly lady laying in bed appears mildly tachypneic when talking.  Heart with normal rate  and rhythm, lungs with crackles heard on lung bases, abdomen soft nontender, trace edema noted to bilateral lower extremities.  No JVD.  She is globally weak and she does have some tenderness to lumbar spine on palpation.  She is mentating appropriately.  Today's workup was remarkable for elevated BNP of 1707.  Chest x-ray also shows increased bibasilar opacities which may reflect atelectasis or possible trace pleural effusions. EMR reviewed, last echo on 06/04/2021 shows an EF of 60-65%  -Labs ordered, independently viewed and interpreted by me.  Labs remarkable for TSH 7.095 suggestive of hypothyrodism which may contribute to her anorexia.  Troponin 54, mildly elevated and likely due to demand ischemia from CHF.  BNP markedly elevated of 1,707 -The patient was  maintained on a cardiac monitor.  I personally viewed and interpreted the cardiac monitored which showed an underlying rhythm of: NSR -Imaging independently viewed and interpreted by me and I agree with radiologist's interpretation.  Result remarkable for chest CTA showing no PE.  Evidence of multilevel thoracic compression fx, progressive from prior -This patient presents to the ED for concern of anorexia, this involves an extensive number of treatment options, and is a complaint that carries with it a high risk of complications and morbidity.  The differential diagnosis includes thyroid disease, infectious cause, electrolytes derangement, depression -Co morbidities that complicate the patient evaluation includes AAA, HTN, Raynaud's, osteoporosis -Treatment includes lasix -Reevaluation of the patient after these medicines showed that the patient stayed the same -PCP office notes or outside notes reviewed -Discussion with [facility] DR. Rathore who agrees to admit pt  -Escalation to admission/observation considered: patient is agreeable with hospital admission for further care.    6:41 PM Patient here with anorexia since she was  discharged home after admission for thoracic compression fracture requiring surgical repair.  She mention she does not have much of an appetite and have noticed increased shortness of breath and peripheral edema since.  She also endorsed persistent back pain since the surgery.  Have some loose stools and overall not feeling well.  Workup today is remarkable for elevated BNP of 1700 and suggestive of new onset CHF.  Lasix given.  Patient has an elevated troponin of 54 without ischemic changes on EKG.  This could be demand ischemia from new CHF.  She also has elevated TSH level suggestive of hypothyroidism which is contributing to her peripheral edema.  CT scan today shows progressive worsening thoracic compression spine multilevel.        Final Clinical Impression(s) / ED Diagnoses Final diagnoses:  Hypothyroidism, unspecified type  Acute congestive heart failure, unspecified heart failure type (HCC)  Elevated troponin  Compression fracture of thoracic vertebra, unspecified thoracic vertebral level, initial encounter [facility])    Rx / DC Orders ED Discharge Orders     None         Domenic Moras, PA-C 03/03/22 1914    Teressa Lower, MD 03/04/22 1318

## 2022-03-03 NOTE — ED Notes (Signed)
Patient stable at this time. No acute concerns.

## 2022-03-03 NOTE — ED Notes (Signed)
Pt placed on monitor.  

## 2022-03-03 NOTE — ED Notes (Signed)
Patient transported to CT 

## 2022-03-03 NOTE — ED Triage Notes (Signed)
Patient here with complaint of anorexia since at least the last three weeks. Patient reports recent surgical repair of lumbar compression fractures. Patient denies abdominal pain, denies nausea, reports intermittent diarrhea. Patient is alert, oriented, and in no apparent distress at this time.

## 2022-03-04 ENCOUNTER — Observation Stay (HOSPITAL_COMMUNITY): Payer: Medicare Other

## 2022-03-04 DIAGNOSIS — Z7189 Other specified counseling: Secondary | ICD-10-CM | POA: Diagnosis not present

## 2022-03-04 DIAGNOSIS — B370 Candidal stomatitis: Secondary | ICD-10-CM | POA: Diagnosis present

## 2022-03-04 DIAGNOSIS — R946 Abnormal results of thyroid function studies: Secondary | ICD-10-CM | POA: Diagnosis not present

## 2022-03-04 DIAGNOSIS — I5020 Unspecified systolic (congestive) heart failure: Secondary | ICD-10-CM | POA: Diagnosis not present

## 2022-03-04 DIAGNOSIS — I700 Atherosclerosis of aorta: Secondary | ICD-10-CM | POA: Diagnosis present

## 2022-03-04 DIAGNOSIS — Z515 Encounter for palliative care: Secondary | ICD-10-CM | POA: Diagnosis not present

## 2022-03-04 DIAGNOSIS — I5021 Acute systolic (congestive) heart failure: Secondary | ICD-10-CM

## 2022-03-04 DIAGNOSIS — G4700 Insomnia, unspecified: Secondary | ICD-10-CM | POA: Diagnosis present

## 2022-03-04 DIAGNOSIS — J449 Chronic obstructive pulmonary disease, unspecified: Secondary | ICD-10-CM | POA: Diagnosis present

## 2022-03-04 DIAGNOSIS — R531 Weakness: Secondary | ICD-10-CM | POA: Diagnosis not present

## 2022-03-04 DIAGNOSIS — R7989 Other specified abnormal findings of blood chemistry: Secondary | ICD-10-CM | POA: Diagnosis not present

## 2022-03-04 DIAGNOSIS — I509 Heart failure, unspecified: Secondary | ICD-10-CM | POA: Diagnosis not present

## 2022-03-04 DIAGNOSIS — Z66 Do not resuscitate: Secondary | ICD-10-CM

## 2022-03-04 DIAGNOSIS — S22000A Wedge compression fracture of unspecified thoracic vertebra, initial encounter for closed fracture: Secondary | ICD-10-CM | POA: Diagnosis not present

## 2022-03-04 DIAGNOSIS — J42 Unspecified chronic bronchitis: Secondary | ICD-10-CM | POA: Diagnosis not present

## 2022-03-04 DIAGNOSIS — F419 Anxiety disorder, unspecified: Secondary | ICD-10-CM | POA: Diagnosis present

## 2022-03-04 DIAGNOSIS — I2489 Other forms of acute ischemic heart disease: Secondary | ICD-10-CM | POA: Diagnosis present

## 2022-03-04 DIAGNOSIS — R296 Repeated falls: Secondary | ICD-10-CM | POA: Diagnosis present

## 2022-03-04 DIAGNOSIS — R54 Age-related physical debility: Secondary | ICD-10-CM | POA: Diagnosis present

## 2022-03-04 DIAGNOSIS — R1310 Dysphagia, unspecified: Secondary | ICD-10-CM | POA: Diagnosis present

## 2022-03-04 DIAGNOSIS — I351 Nonrheumatic aortic (valve) insufficiency: Secondary | ICD-10-CM | POA: Diagnosis present

## 2022-03-04 DIAGNOSIS — I110 Hypertensive heart disease with heart failure: Secondary | ICD-10-CM | POA: Diagnosis present

## 2022-03-04 DIAGNOSIS — G8929 Other chronic pain: Secondary | ICD-10-CM | POA: Diagnosis present

## 2022-03-04 DIAGNOSIS — M4856XA Collapsed vertebra, not elsewhere classified, lumbar region, initial encounter for fracture: Secondary | ICD-10-CM | POA: Diagnosis present

## 2022-03-04 DIAGNOSIS — E876 Hypokalemia: Secondary | ICD-10-CM | POA: Diagnosis present

## 2022-03-04 DIAGNOSIS — M4854XA Collapsed vertebra, not elsewhere classified, thoracic region, initial encounter for fracture: Secondary | ICD-10-CM | POA: Diagnosis present

## 2022-03-04 DIAGNOSIS — I5041 Acute combined systolic (congestive) and diastolic (congestive) heart failure: Secondary | ICD-10-CM | POA: Diagnosis not present

## 2022-03-04 DIAGNOSIS — E785 Hyperlipidemia, unspecified: Secondary | ICD-10-CM | POA: Diagnosis present

## 2022-03-04 DIAGNOSIS — E039 Hypothyroidism, unspecified: Secondary | ICD-10-CM | POA: Diagnosis present

## 2022-03-04 DIAGNOSIS — I10 Essential (primary) hypertension: Secondary | ICD-10-CM | POA: Diagnosis not present

## 2022-03-04 DIAGNOSIS — R627 Adult failure to thrive: Secondary | ICD-10-CM | POA: Diagnosis present

## 2022-03-04 DIAGNOSIS — I429 Cardiomyopathy, unspecified: Secondary | ICD-10-CM | POA: Diagnosis present

## 2022-03-04 DIAGNOSIS — Z1152 Encounter for screening for COVID-19: Secondary | ICD-10-CM | POA: Diagnosis not present

## 2022-03-04 DIAGNOSIS — I2510 Atherosclerotic heart disease of native coronary artery without angina pectoris: Secondary | ICD-10-CM | POA: Diagnosis present

## 2022-03-04 DIAGNOSIS — R52 Pain, unspecified: Secondary | ICD-10-CM | POA: Diagnosis not present

## 2022-03-04 DIAGNOSIS — R0602 Shortness of breath: Secondary | ICD-10-CM | POA: Diagnosis present

## 2022-03-04 DIAGNOSIS — R5381 Other malaise: Secondary | ICD-10-CM | POA: Diagnosis present

## 2022-03-04 DIAGNOSIS — R630 Anorexia: Secondary | ICD-10-CM | POA: Diagnosis not present

## 2022-03-04 LAB — COMPREHENSIVE METABOLIC PANEL
ALT: 14 U/L (ref 0–44)
AST: 23 U/L (ref 15–41)
Albumin: 3.6 g/dL (ref 3.5–5.0)
Alkaline Phosphatase: 78 U/L (ref 38–126)
Anion gap: 17 — ABNORMAL HIGH (ref 5–15)
BUN: 12 mg/dL (ref 8–23)
CO2: 28 mmol/L (ref 22–32)
Calcium: 9 mg/dL (ref 8.9–10.3)
Chloride: 97 mmol/L — ABNORMAL LOW (ref 98–111)
Creatinine, Ser: 0.72 mg/dL (ref 0.44–1.00)
GFR, Estimated: >60 mL/min (ref >60)
Glucose, Bld: 98 mg/dL (ref 70–99)
Potassium: 3.3 mmol/L — ABNORMAL LOW (ref 3.5–5.1)
Sodium: 142 mmol/L (ref 135–145)
Total Bilirubin: 2.3 mg/dL — ABNORMAL HIGH (ref 0.3–1.2)
Total Protein: 6 g/dL — ABNORMAL LOW (ref 6.5–8.1)

## 2022-03-04 LAB — ECHOCARDIOGRAM COMPLETE
AR max vel: 1.99 cm2
AV Area VTI: 1.79 cm2
AV Area mean vel: 1.85 cm2
AV Mean grad: 6 mmHg
AV Peak grad: 9.8 mmHg
Ao pk vel: 1.56 m/s
P 1/2 time: 353 msec
S' Lateral: 4.4 cm

## 2022-03-04 LAB — TROPONIN I (HIGH SENSITIVITY)
Troponin I (High Sensitivity): 63 ng/L — ABNORMAL HIGH (ref <18)
Troponin I (High Sensitivity): 65 ng/L — ABNORMAL HIGH (ref <18)
Troponin I (High Sensitivity): 64 ng/L — ABNORMAL HIGH (ref <18)
Troponin I (High Sensitivity): 59 ng/L — ABNORMAL HIGH (ref <18)

## 2022-03-04 LAB — T4, FREE: Free T4: 1.34 ng/dL — ABNORMAL HIGH (ref 0.61–1.12)

## 2022-03-04 MED ORDER — LEVOTHYROXINE SODIUM 50 MCG PO TABS
50.0000 ug | ORAL_TABLET | Freq: Every day | ORAL | Status: DC
  Administered 2022-03-04 – 2022-03-06 (×3): 50 ug via ORAL
  Filled 2022-03-04 (×3): qty 1

## 2022-03-04 MED ORDER — OXYCODONE HCL 5 MG PO TABS
5.0000 mg | ORAL_TABLET | Freq: Four times a day (QID) | ORAL | Status: DC | PRN
  Administered 2022-03-05 – 2022-03-07 (×3): 5 mg via ORAL
  Filled 2022-03-04 (×3): qty 1

## 2022-03-04 MED ORDER — POTASSIUM CHLORIDE CRYS ER 20 MEQ PO TBCR
40.0000 meq | EXTENDED_RELEASE_TABLET | Freq: Two times a day (BID) | ORAL | Status: AC
  Administered 2022-03-04 – 2022-03-05 (×3): 40 meq via ORAL
  Filled 2022-03-04 (×4): qty 2

## 2022-03-04 MED ORDER — FUROSEMIDE 10 MG/ML IJ SOLN
40.0000 mg | Freq: Every day | INTRAMUSCULAR | Status: DC
  Administered 2022-03-04 – 2022-03-06 (×2): 40 mg via INTRAVENOUS
  Filled 2022-03-04 (×4): qty 4

## 2022-03-04 NOTE — ED Notes (Signed)
ED TO INPATIENT HANDOFF REPORT  ED Nurse Name and Phone #:  Su Grand [phone]  S Name/Age/Gender Stephanie Collier 87 y.o. female Room/Bed: 038C/038C  Code Status   Code Status: DNR  Home/SNF/Other Nursing Home Patient oriented to: self, place, time, and situation Is this baseline? Yes   Triage Complete: Triage complete  Chief Complaint CHF, acute ([place]) [I50.9]  Triage Note Patient here with complaint of anorexia since at least the last three weeks. Patient reports recent surgical repair of lumbar compression fractures. Patient denies abdominal pain, denies nausea, reports intermittent diarrhea. Patient is alert, oriented, and in no apparent distress at this time.   Allergies Allergies  Allergen Reactions   Fentanyl Shortness Of Breath   Asa [Aspirin]     Per patient stomach cramps   Azithromycin     Other reaction(s): diarrhea   Fosamax [Alendronate]     Other reaction(s): indigestion   Labetalol Hcl     Other reaction(s): indigestion    Level of Care/Admitting Diagnosis ED Disposition     ED Disposition  Admit   Condition  --   Comment  Hospital Area: [facility] [id number]  Level of Care: Telemetry Cardiac [[id number]]  May place patient in observation at [facility] or [place] if equivalent level of care is available:: Yes  Covid Evaluation: Asymptomatic - no recent exposure (last 10 days) testing not required  Diagnosis: CHF, acute [facility]) [id number]  Admitting Physician: Shela Leff [id number]  Attending Physician: Shela Leff [id number]          B Medical/Surgery History Past Medical History:  Diagnosis Date   AAA (abdominal aortic aneurysm) ([place])    Arthritis    hip   Hypertension    no meds now   Osteoporosis    Patellar fracture    left   Raynaud's disease    Scoliosis    Past Surgical History:  Procedure Laterality Date   APPENDECTOMY     at age 36   BREAST BIOPSY Left    CHEST TUBE INSERTION Left 04/04/2014    Procedure: CHEST TUBE INSERTION;  Surgeon: Serafina Mitchell, MD;  Location: [place];  Service: Vascular;  Laterality: Left;  by Dr. Vernetta Honey   INSERTION OF VENA CAVA FILTER N/A 04/02/2014   Procedure: INSERTION OF VENA CAVA FILTER;  Surgeon: Serafina Mitchell, MD;  Location: [place] CATH LAB;  Service: Cardiovascular;  Laterality: N/A;   INTRAMEDULLARY (IM) NAIL INTERTROCHANTERIC Left 06/04/2021   Procedure: INTRAMEDULLARY (IM) NAIL INTERTROCHANTRIC;  Surgeon: Georgeanna Harrison, MD;  Location: [place];  Service: Orthopedics;  Laterality: Left;   IR KYPHO EA ADDL LEVEL THORACIC OR LUMBAR  02/10/2022   IR KYPHO LUMBAR INC FX REDUCE BONE BX UNI/BIL CANNULATION INC/IMAGING  02/10/2022   ORIF PATELLA Left 11/20/2020   Procedure: OPEN REDUCTION INTERNAL (ORIF) FIXATION PATELLA;  Surgeon: Willaim Sheng, MD;  Location: [place];  Service: Orthopedics;  Laterality: Left;   PERIPHERAL VASCULAR CATHETERIZATION N/A 06/26/2014   Procedure: IVC Filter Removal;  Surgeon: Serafina Mitchell, MD;  Location: [place] CV LAB;  Service: Cardiovascular;  Laterality: N/A;   THORACIC AORTIC ENDOVASCULAR STENT GRAFT N/A 04/04/2014   Procedure: THORACIC AORTIC ENDOVASCULAR STENT GRAFT;  Surgeon: Serafina Mitchell, MD;  Location: [place];  Service: Vascular;  Laterality: N/A;     A IV Location/Drains/Wounds Patient Lines/Drains/Airways Status     Active Line/Drains/Airways     Name Placement date Placement time Site Days   Peripheral IV 03/03/22 Right  Antecubital 03/03/22  1607  Antecubital  1   External Urinary Catheter 03/03/22  1603  --  1   Wound / Incision (Open or Dehisced) 02/10/22 Puncture Vertebral column Left 02/10/22  1350  Vertebral column  22   Wound / Incision (Open or Dehisced) 02/10/22 Puncture Vertebral column Right 02/10/22  1351  Vertebral column  22            Intake/Output Last 24 hours  Intake/Output Summary (Last 24 hours) at 03/04/2022 [id number] Last data filed at 03/04/2022 0100 Gross per  24 hour  Intake --  Output 500 ml  Net -500 ml    Labs/Imaging Results for orders placed or performed during the hospital encounter of 03/03/22 (from the past 48 hour(s))  T4, free     Status: Abnormal   Collection Time: 03/03/22 12:24 PM  Result Value Ref Range   Free T4 1.34 (H) 0.61 - 1.12 ng/dL    Comment: HEMOLYSIS AT THIS LEVEL MAY AFFECT RESULT (NOTE) Biotin ingestion may interfere with free T4 tests. If the results are inconsistent with the TSH level, previous test results, or the clinical presentation, then consider biotin interference. If needed, order repeat testing after stopping biotin. Performed at [facility], [place] [address] [place] [address]   CBC     Status: None   Collection Time: 03/03/22 12:43 PM  Result Value Ref Range   WBC 5.2 4.0 - 10.5 K/uL   RBC 4.43 3.87 - 5.11 MIL/uL   Hemoglobin 13.5 12.0 - 15.0 g/dL   HCT 43.0 36.0 - 46.0 %   MCV 97.1 80.0 - 100.0 fL   MCH 30.5 26.0 - 34.0 pg   MCHC 31.4 30.0 - 36.0 g/dL   RDW 14.8 11.5 - 15.5 %   Platelets 159 150 - 400 K/uL   nRBC 0.0 0.0 - 0.2 %    Comment: Performed at [facility], [place] [address], [place], [place] [id number]  Basic metabolic panel     Status: Abnormal   Collection Time: 03/03/22 12:43 PM  Result Value Ref Range   Sodium 136 135 - 145 mmol/L   Potassium 3.5 3.5 - 5.1 mmol/L   Chloride 101 98 - 111 mmol/L   CO2 25 22 - 32 mmol/L   Glucose, Bld 123 (H) 70 - 99 mg/dL    Comment: Glucose reference range applies only to samples taken after fasting for at least 8 hours.   BUN 17 8 - 23 mg/dL   Creatinine, Ser 0.70 0.44 - 1.00 mg/dL   Calcium 9.2 8.9 - 10.3 mg/dL   GFR, Estimated >60 >60 mL/min    Comment: (NOTE) Calculated using the CKD-EPI Creatinine Equation (2021)    Anion gap 10 5 - 15    Comment: Performed at [facility] [address] [place], [place] [address]  Brain natriuretic peptide     Status: Abnormal   Collection Time: 03/03/22 12:43 PM   Result Value Ref Range   B Natriuretic Peptide 1,707.4 (H) 0.0 - 100.0 pg/mL    Comment: Performed at [facility] [address], [place], [place] [address]  Resp panel by RT-PCR (RSV, Flu A&B, Covid) Anterior Nasal Swab     Status: None   Collection Time: 03/03/22  3:56 PM   Specimen: Anterior Nasal Swab  Result Value Ref Range   SARS Coronavirus 2 by RT PCR NEGATIVE NEGATIVE   Influenza A by PCR NEGATIVE NEGATIVE  Influenza B by PCR NEGATIVE NEGATIVE    Comment: (NOTE) The Xpert Xpress SARS-CoV-2/FLU/RSV plus assay is intended as an aid in the diagnosis of influenza from Nasopharyngeal swab specimens and should not be used as a sole basis for treatment. Nasal washings and aspirates are unacceptable for Xpert Xpress SARS-CoV-2/FLU/RSV testing.  Fact Sheet for Patients: EntrepreneurPulse.com.au  Fact Sheet for Healthcare Providers: IncredibleEmployment.be  This test is not yet approved or cleared by the [place] FDA and has been authorized for detection and/or diagnosis of SARS-CoV-2 by FDA under an Emergency Use Authorization (EUA). This EUA will remain in effect (meaning this test can be used) for the duration of the COVID-19 declaration under Section 564(b)(1) of the Act, 21 U.S.C. section 360bbb-3(b)(1), unless the authorization is terminated or revoked.     Resp Syncytial Virus by PCR NEGATIVE NEGATIVE    Comment: (NOTE) Fact Sheet for Patients: EntrepreneurPulse.com.au  Fact Sheet for Healthcare Providers: IncredibleEmployment.be  This test is not yet approved or cleared by the [place] FDA and has been authorized for detection and/or diagnosis of SARS-CoV-2 by FDA under an Emergency Use Authorization (EUA). This EUA will remain in effect (meaning this test can be used) for the duration of the COVID-19 declaration under Section 564(b)(1) of the Act, 21 U.S.C. section  360bbb-3(b)(1), unless the authorization is terminated or revoked.  Performed at [facility], [place] [address], [place], [place] [address]   Troponin I (High Sensitivity)     Status: Abnormal   Collection Time: 03/03/22  4:51 PM  Result Value Ref Range   Troponin I (High Sensitivity) 54 (H) <18 ng/L    Comment: (NOTE) Elevated high sensitivity troponin I (hsTnI) values and significant  changes across serial measurements may suggest ACS but many other  chronic and acute conditions are known to elevate hsTnI results.  Refer to the "Links" section for chest pain algorithms and additional  guidance. Performed at [facility], [place] [address] [place], [place] [address]   TSH     Status: Abnormal   Collection Time: 03/03/22  4:52 PM  Result Value Ref Range   TSH 7.095 (H) 0.350 - 4.500 uIU/mL    Comment: Performed by a 3rd Generation assay with a functional sensitivity of <=0.01 uIU/mL. Performed at [facility], [place] [address], [place], [place] [address]   Urinalysis, Routine w reflex microscopic -Urine, Clean Catch     Status: Abnormal   Collection Time: 03/03/22  9:16 PM  Result Value Ref Range   Color, Urine COLORLESS (A) YELLOW   APPearance CLEAR CLEAR   Specific Gravity, Urine 1.012 1.005 - 1.030   pH 6.0 5.0 - 8.0   Glucose, UA NEGATIVE NEGATIVE mg/dL   Hgb urine dipstick NEGATIVE NEGATIVE   Bilirubin Urine NEGATIVE NEGATIVE   Ketones, ur NEGATIVE NEGATIVE mg/dL   Protein, ur NEGATIVE NEGATIVE mg/dL   Nitrite NEGATIVE NEGATIVE   Leukocytes,Ua NEGATIVE NEGATIVE   RBC / HPF 0-5 0 - 5 RBC/hpf   WBC, UA 0-5 0 - 5 WBC/hpf   Bacteria, UA NONE SEEN NONE SEEN   Squamous Epithelial / HPF 0-5 0 - 5 /HPF    Comment: Performed at [facility], [place] [address] [place],  [address]  Troponin I (High Sensitivity)     Status: Abnormal   Collection Time: 03/04/22 12:36 AM  Result Value Ref Range   Troponin I (High Sensitivity) 63 (H) <18 ng/L    Comment:  (NOTE) Elevated  high sensitivity troponin I (hsTnI) values and significant  changes across serial measurements may suggest ACS but many other  chronic and acute conditions are known to elevate hsTnI results.  Refer to the "Links" section for chest pain algorithms and additional  guidance. Performed at [facility], [place] [address] [place], [place] [address]   Comprehensive metabolic panel     Status: Abnormal   Collection Time: 03/04/22  2:19 AM  Result Value Ref Range   Sodium 142 135 - 145 mmol/L   Potassium 3.3 (L) 3.5 - 5.1 mmol/L   Chloride 97 (L) 98 - 111 mmol/L   CO2 28 22 - 32 mmol/L   Glucose, Bld 98 70 - 99 mg/dL    Comment: Glucose reference range applies only to samples taken after fasting for at least 8 hours.   BUN 12 8 - 23 mg/dL   Creatinine, Ser 0.72 0.44 - 1.00 mg/dL   Calcium 9.0 8.9 - 10.3 mg/dL   Total Protein 6.0 (L) 6.5 - 8.1 g/dL   Albumin 3.6 3.5 - 5.0 g/dL   AST 23 15 - 41 U/L   ALT 14 0 - 44 U/L   Alkaline Phosphatase 78 38 - 126 U/L   Total Bilirubin 2.3 (H) 0.3 - 1.2 mg/dL   GFR, Estimated >60 >60 mL/min    Comment: (NOTE) Calculated using the CKD-EPI Creatinine Equation (2021)    Anion gap 17 (H) 5 - 15    Comment: Performed at [facility], [place] [address] [place], [place] [address]  Troponin I (High Sensitivity)     Status: Abnormal   Collection Time: 03/04/22  2:19 AM  Result Value Ref Range   Troponin I (High Sensitivity) 65 (H) <18 ng/L    Comment: (NOTE) Elevated high sensitivity troponin I (hsTnI) values and significant  changes across serial measurements may suggest ACS but many other  chronic and acute conditions are known to elevate hsTnI results.  Refer to the "Links" section for chest pain algorithms and additional  guidance. Performed at [facility], [place] [address] [place], [place] [address]   Troponin I (High Sensitivity)     Status: Abnormal   Collection Time: 03/04/22  3:37 AM  Result Value Ref Range    Troponin I (High Sensitivity) 64 (H) <18 ng/L    Comment: (NOTE) Elevated high sensitivity troponin I (hsTnI) values and significant  changes across serial measurements may suggest ACS but many other  chronic and acute conditions are known to elevate hsTnI results.  Refer to the "Links" section for chest pain algorithms and additional  guidance. Performed at [facility], [place] [address] [place], [place] [address]   Troponin I (High Sensitivity)     Status: Abnormal   Collection Time: 03/04/22  5:56 AM  Result Value Ref Range   Troponin I (High Sensitivity) 59 (H) <18 ng/L    Comment: (NOTE) Elevated high sensitivity troponin I (hsTnI) values and significant  changes across serial measurements may suggest ACS but many other  chronic and acute conditions are known to elevate hsTnI results.  Refer to the "Links" section for chest pain algorithms and additional  guidance. Performed at [facility], [place] [address], [place], [place] [address]    CT ABDOMEN PELVIS WO CONTRAST  Result Date: 03/03/2022 CLINICAL DATA:  Abdominal mass with decreased appetite. EXAM: CT ABDOMEN AND PELVIS WITHOUT CONTRAST TECHNIQUE: Multidetector CT imaging of the abdomen and pelvis was performed following  the standard protocol without IV contrast. RADIATION DOSE REDUCTION: This exam was performed according to the departmental dose-optimization program which includes automated exposure control, adjustment of the mA and/or kV according to patient size and/or use of iterative reconstruction technique. COMPARISON:  CTA chest today, CTA chest, abdomen and pelvis 02/07/2022 FINDINGS: Lower chest: There is moderate pan chamber cardiomegaly, through three-vessel coronary artery calcifications and a small chronic pericardial effusion. Lung bases show scarring changes and posterior atelectasis without infiltrates. Hepatobiliary: Limited visualization due to artifact from overlying wires and the patient's arms in the  field. No obvious focal liver abnormality. Gallbladder is contracted and poorly seen. No biliary dilatation. Pancreas: Limited visualization due to the patient's arms and overlying wires causing streak artifact. No obvious abnormality. 7 mm lipoma was previously seen posteriorly in the pancreatic neck. Spleen: No obvious abnormality. Adrenals/Urinary Tract: No adrenal or renal masses seen. There is mild bilateral renal volume loss. There is contrast in the collecting systems. No hydronephrosis or ureterectasis is seen. There is contrast opacification of the unremarkable bladder. The wall and lumen are unremarkable. Stomach/Bowel: No dilatation or wall thickening is visible without contrast. An appendix is not seen. Uncomplicated sigmoid diverticulosis. Vascular/Lymphatic: No adenopathy is seen. There is extensive aortoiliac calcification. There is calcification in the visceral branch arteries. 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 Reproductive: Uterus and bilateral adnexa are unremarkable. Other: There is no free air, free hemorrhage or free fluid. Musculoskeletal: Osteopenia. Moderate L3 and 4 compression fractures have been treated with kyphoplasty in the interval. There is progressive compression fracture of the T8 vertebral body with about 30% overall height loss. Old left hip nailing. Degenerative change and dextro rotary scoliosis lumbar spine. IMPRESSION: 1. No acute noncontrast CT abnormality is seen in the abdomen or pelvis. 2. Cardiomegaly with small chronic pericardial effusion. 3. Aortic and coronary artery atherosclerosis. 4. Extensive aortoiliac calcifications and stable 4.4 x 4.5 cm fusiform distal infrarenal AAA. Possible that the  aneurysm could be palpated as a mass because it is in between the abdominal wall and spine. Recommend follow-up every 6 months and vascular consultation. Reference: J Am Coll Radiol [id number]. 5. 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 6. Osteopenia, scoliosis and degenerative change. 7. Progressive T8 compression fracture with about 30% height loss. 8. L3 and L4 moderate compression fractures have been treated with kyphoplasty in the interval. No increased height loss at either level. Aortic Atherosclerosis (ICD10-I70.0). Electronically Signed   By: Telford Nab M.D.   On: 03/03/2022 21:56   CT Angio Chest PE W and/or Wo Contrast  Result Date: 03/03/2022 CLINICAL DATA:  Pulmonary embolism suspected. High probability. Recent lumbar surgery. EXAM: CT ANGIOGRAPHY CHEST WITH CONTRAST TECHNIQUE: Multidetector CT imaging of the chest was performed using the standard protocol during bolus administration of intravenous contrast. Multiplanar CT image reconstructions and MIPs were obtained to evaluate the vascular anatomy. RADIATION DOSE REDUCTION: This exam was performed according to the departmental dose-optimization program which includes automated exposure control, adjustment of the mA and/or kV according to patient size and/or use of iterative reconstruction technique. CONTRAST:  85m OMNIPAQUE IOHEXOL 350 MG/ML SOLN COMPARISON:  02/07/2022 FINDINGS: Cardiovascular: Chronic cardiomegaly. Coronary artery calcification. Aortic  atherosclerotic calcification. Thoracic aortic stent without complicating feature. This is not opacified by contrast as this exam is targeted towards the pulmonary arteries. Pulmonary arterial opacification is good. No pulmonary emboli. Mediastinum/Nodes: No mass or adenopathy. Lungs/Pleura: Chronic upper lobe pulmonary scarring. Minimal dependent atelectasis. No pleural effusion. No sign of pneumonia or mass lesion. Upper Abdomen:  Negative Musculoskeletal: Thoracic compression fractures at T5, T6 and T7 appear the same as they did on the prior exam. There has been progression of compression fractures at T8 and T10. Review of the MIP images confirms the above findings. IMPRESSION: 1. No pulmonary emboli. 2. Chronic cardiomegaly. Coronary artery calcification. Aortic stent without visible complicating feature. 3. Chronic upper lobe pulmonary scarring. 4. Thoracic compression fractures at T5, T6 and T7 appear the same as they did on the prior exam. There has been progression of compression fractures at T8 and T10. Aortic Atherosclerosis (ICD10-I70.0). Electronically Signed   By: Nelson Chimes M.D.   On: 03/03/2022 18:21   DG Chest 2 View  Result Date: 03/03/2022 CLINICAL DATA:  Shortness of breath. EXAM: CHEST - 2 VIEW COMPARISON:  Chest radiograph 06/03/2021 and CTA 02/07/2022 FINDINGS: The cardiac silhouette remains enlarged. A descending thoracic aortic stent graft is again noted. There is chronic interstitial coarsening. Increased, mild densities in the lung bases compared to the prior radiograph may reflect atelectasis and possibly trace pleural effusions. No pneumothorax is identified. Thoracolumbar scoliosis and multiple compression fractures are again noted. IMPRESSION: Increased bibasilar opacities which may reflect atelectasis and possible trace pleural effusions. Electronically Signed   By: Logan Bores M.D.   On: 03/03/2022 13:16    Pending Labs Unresulted Labs (From admission, onward)     Start     Ordered   03/05/22 0500  CBC with Differential/Platelet  Tomorrow morning,   R        03/04/22 0929   03/05/22 [id number]  Basic metabolic panel  Tomorrow morning,   R        03/04/22 0929   03/05/22 0500  Magnesium  Tomorrow morning,   R        03/04/22 0929   03/05/22 0500  Phosphorus  Tomorrow morning,   R        03/04/22 0929   03/03/22 1745  T3, free  Once,   URGENT        03/03/22 1744   03/03/22 1628  Vitamin B1  Once,    URGENT        03/03/22 1627            Vitals/Pain Today's Vitals   03/04/22 0830 03/04/22 0842 03/04/22 0900 03/04/22 0930  BP: (!) 146/63  (!) 130/57 (!) 141/68  Pulse:      Resp: (!) 21  20 (!) 26  Temp:  97.7 F (36.5 C)    TempSrc:  Oral    SpO2: 97%  95% 98%  PainSc:        Isolation Precautions No active isolations  Medications Medications  lidocaine (LIDODERM) 5 % 1 patch (1 patch Transdermal Patch Removed 03/04/22 0941)  nystatin (MYCOSTATIN) 100000 UNIT/ML suspension 500,000 Units (500,000 Units Oral Given 03/03/22 2146)  ipratropium-albuterol (DUONEB) 0.5-2.5 (3) MG/3ML nebulizer solution 3 mL (has no administration in time range)  hydrALAZINE (APRESOLINE) injection 5 mg (has no administration in time range)  oxyCODONE (Oxy IR/ROXICODONE) immediate release tablet 5 mg (has no administration in time range)  naloxone (NARCAN) injection 0.4 mg (has no administration in time range)  furosemide (LASIX) injection  40 mg (40 mg Intravenous Given 03/04/22 0937)  potassium chloride SA (KLOR-CON M) CR tablet 40 mEq (40 mEq Oral Given 03/04/22 0937)  furosemide (LASIX) injection 40 mg (40 mg Intravenous Given 03/03/22 1645)  iohexol (OMNIPAQUE) 350 MG/ML injection 75 mL (75 mLs Intravenous Contrast Given 03/03/22 1812)    Mobility walks with device     Focused Assessments Cardiac Assessment Handoff:  Cardiac Rhythm: Normal sinus rhythm Lab Results  Component Value Date   CKTOTAL 61 06/03/2021   TROPONINI <0.30 08/10/2012   No results found for: "DDIMER" Does the Patient currently have chest pain? No    R Recommendations: See Admitting Provider Note  Report given to:   Additional Notes:  encourage apple sauce if pt does not want dietary tray

## 2022-03-04 NOTE — ED Notes (Signed)
This RN alerted Rathore MD of elevated troponin. Orders placed.

## 2022-03-04 NOTE — ED Notes (Signed)
Dr Nevada Crane in speaking with patient at this time.

## 2022-03-04 NOTE — ED Notes (Signed)
Patient ate applesauce without difficulty.

## 2022-03-04 NOTE — ED Notes (Signed)
Pt refused breakfast tray and applesauce. Pt noted drinking orange juice without difficulty.

## 2022-03-04 NOTE — Evaluation (Addendum)
Physical Therapy Evaluation Patient Details Name: Stephanie Collier MRN: [id number] DOB: 1935-10-18 Today's Date: 03/04/2022  History of Present Illness  87 y.o. female  with past medical history of essential hypertension, AAA, COPD, Raynaud's disease, scoliosis, and recent admission for intractable back pain due to multiple spinal fractures status post kyphoplasty admitted on 03/03/2022 with generalized weakness, loss of appetite, and shortness of breath.  Patient diagnosed with acute CHF.  Chest x-ray with cardiomegaly.  Also with elevated BNP.  She is being treated with IV Lasix.  Patient also found to have stable AAA and more progression of compression fractures of T8 and T10.  She was found with elevated TSH at 7.  PMT consulted to discuss goals of care.  Clinical Impression  Pt had fair tolerance to treatment today. Pt reported new onset on bilateral calf pain during standing marches. Pt also reports a history of DVTs during a previous admission. Pt had postive Homan's Sign in both calves with the L more painful than the R. RN called into room and made aware. RN made MD aware. Further mobility was deferred today due to calf pain however based on mobility today pt would likely benefit from SNF placement following DC. PT will continue to follow during acute stay.      Recommendations for follow up therapy are one component of a multi-disciplinary discharge planning process, led by the attending physician.  Recommendations may be updated based on patient status, additional functional criteria and insurance authorization.  Follow Up Recommendations Skilled nursing-short term rehab (<3 hours/day) Can patient physically be transported by private vehicle: No    Assistance Recommended at Discharge Intermittent Supervision/Assistance  Patient can return home with the following  A lot of help with walking and/or transfers;A lot of help with bathing/dressing/bathroom;Assistance with cooking/housework;Assist for  transportation;Help with stairs or ramp for entrance    Equipment Recommendations Other (comment) (Per accepting facility)  Recommendations for Other Services       Functional Status Assessment Patient has had a recent decline in their functional status and demonstrates the ability to make significant improvements in function in a reasonable and predictable amount of time.     Precautions / Restrictions Precautions Precautions: Fall;Back Precaution Comments: reviewed back precautions Restrictions Weight Bearing Restrictions: No      Mobility  Bed Mobility Overal bed mobility: Needs Assistance Bed Mobility: Supine to Sit, Sit to Supine     Supine to sit: Modified independent (Device/Increase time) Sit to supine: Min guard        Transfers Overall transfer level: Needs assistance Equipment used: Rolling walker (2 wheels) Transfers: Sit to/from Stand Sit to Stand: Min guard                Ambulation/Gait               General Gait Details: Deferred due to calf pain and fatigue; pt able to march in place for ~15 seconds. RN made aware of calf pain  Stairs            Wheelchair Mobility    Modified Rankin (Stroke Patients Only)       Balance Overall balance assessment: Mild deficits observed, not formally tested                                           Pertinent Vitals/Pain Pain Assessment Pain Assessment: 0-10 Pain Score: 5  Pain Location: Upper back Pain Descriptors / Indicators: Aching, Grimacing, Discomfort Pain Intervention(s): Monitored during session    Home Living Family/patient expects to be discharged to:: Private residence Living Arrangements: Children Available Help at Discharge: Family;Available 24 hours/day Type of Home: House Home Access: Stairs to enter Entrance Stairs-Rails: Left Entrance Stairs-Number of Steps: 3   Home Layout: One level Home Equipment: Conservation officer, nature (2 wheels);Rollator (4  wheels);Cane - quad;Shower seat      Prior Function Prior Level of Function : Independent/Modified Independent;History of Falls (last six months)             Mobility Comments: ambulatory with Rollator outdoors, RW vs no device indoors, utilizes quad cane for stair negotiation ADLs Comments: mod independent for ADL and light home management at RW level     Hand Dominance   Dominant Hand: Right    Extremity/Trunk Assessment   Upper Extremity Assessment Upper Extremity Assessment: Generalized weakness    Lower Extremity Assessment Lower Extremity Assessment: Generalized weakness       Communication   Communication: No difficulties  Cognition Arousal/Alertness: Awake/alert Behavior During Therapy: Flat affect Overall Cognitive Status: Within Functional Limits for tasks assessed                                          General Comments General comments (skin integrity, edema, etc.): Pt reported new onset on bilateral calf pain during standing marches. Pt also reports a history of DVTs during a previous admission. Pt had postive Homan's Sign in both calves with the L more painful than the R. RN called into room and made aware. RN made MD aware.    Exercises     Assessment/Plan    PT Assessment Patient needs continued PT services  PT Problem List Decreased strength;Decreased activity tolerance;Decreased balance;Decreased mobility;Pain       PT Treatment Interventions DME instruction;Gait training;Functional mobility training;Stair training;Therapeutic activities;Therapeutic exercise;Balance training;Neuromuscular re-education;Patient/family education    PT Goals (Current goals can be found in the Care Plan section)  Acute Rehab PT Goals Patient Stated Goal: to reduce back pain, return to prior level of mobility PT Goal Formulation: With patient Time For Goal Achievement: 03/18/22 Potential to Achieve Goals: Fair    Frequency Min 3X/week      Co-evaluation               AM-PAC PT "6 Clicks" Mobility  Outcome Measure Help needed turning from your back to your side while in a flat bed without using bedrails?: A Little Help needed moving from lying on your back to sitting on the side of a flat bed without using bedrails?: A Little Help needed moving to and from a bed to a chair (including a wheelchair)?: A Little Help needed standing up from a chair using your arms (e.g., wheelchair or bedside chair)?: A Little Help needed to walk in hospital room?: A Lot Help needed climbing 3-5 steps with a railing? : Total 6 Click Score: 15    End of Session   Activity Tolerance: Patient limited by fatigue;Patient limited by pain Patient left: in bed;with call bell/phone within reach;with bed alarm set Nurse Communication: Mobility status;Other (comment) (RN made aware of possible DVT) PT Visit Diagnosis: Other abnormalities of gait and mobility (R26.89);Muscle weakness (generalized) (M62.81);Pain    Time: [id number] PT Time Calculation (min) (ACUTE ONLY): 30 min   Charges:   PT Evaluation $  PT Eval Moderate Complexity: 1 Mod PT Treatments $Therapeutic Activity: 8-22 mins        Shelby Mattocks, PT, DPT Acute Rehab Services [id number]   Viann Shove 03/04/2022, 4:15 PM

## 2022-03-04 NOTE — Evaluation (Signed)
Clinical/Bedside Swallow Evaluation Patient Details  Name: Stephanie Collier MRN: [id number] Date of Birth: 01-Apr-1935  Today's Date: 03/04/2022 Time: SLP Start Time (ACUTE ONLY): 1338 SLP Stop Time (ACUTE ONLY): 1350 SLP Time Calculation (min) (ACUTE ONLY): 12 min  Past Medical History:  Past Medical History:  Diagnosis Date   AAA (abdominal aortic aneurysm) ([place])    Arthritis    hip   Hypertension    no meds now   Osteoporosis    Patellar fracture    left   Raynaud's disease    Scoliosis    Past Surgical History:  Past Surgical History:  Procedure Laterality Date   APPENDECTOMY     at age 5   BREAST BIOPSY Left    CHEST TUBE INSERTION Left 04/04/2014   Procedure: CHEST TUBE INSERTION;  Surgeon: Serafina Mitchell, MD;  Location: [facility];  Service: Vascular;  Laterality: Left;  by Dr. Vernetta Honey   INSERTION OF VENA CAVA FILTER N/A 04/02/2014   Procedure: INSERTION OF VENA CAVA FILTER;  Surgeon: Serafina Mitchell, MD;  Location: [facility] CATH [facility];  Service: Cardiovascular;  Laterality: N/A;   INTRAMEDULLARY (IM) NAIL INTERTROCHANTERIC Left 06/04/2021   Procedure: INTRAMEDULLARY (IM) NAIL INTERTROCHANTRIC;  Surgeon: Georgeanna Harrison, MD;  Location: [facility];  Service: Orthopedics;  Laterality: Left;   IR KYPHO EA ADDL LEVEL THORACIC OR LUMBAR  02/10/2022   IR KYPHO LUMBAR INC FX REDUCE BONE BX UNI/BIL CANNULATION INC/IMAGING  02/10/2022   ORIF PATELLA Left 11/20/2020   Procedure: OPEN REDUCTION INTERNAL (ORIF) FIXATION PATELLA;  Surgeon: Willaim Sheng, MD;  Location: [facility];  Service: Orthopedics;  Laterality: Left;   PERIPHERAL VASCULAR CATHETERIZATION N/A 06/26/2014   Procedure: IVC Filter Removal;  Surgeon: Serafina Mitchell, MD;  Location: [facility];  Service: Cardiovascular;  Laterality: N/A;   THORACIC AORTIC ENDOVASCULAR STENT GRAFT N/A 04/04/2014   Procedure: THORACIC AORTIC ENDOVASCULAR STENT GRAFT;  Surgeon: Serafina Mitchell, MD;  Location: [facility];  Service: Vascular;   Laterality: N/A;   HPI:  87 year old female presented to ED with  generalized weakness, loss of appetite, shortness of breath.  Prior medical history of essential hypertension, AAA, COPD, Raynaud's disease, scoliosis, recent admission for intractable back pain due to multiple spinal fractures status post kyphoplasty. Swallowing was evaluated during last admission on 02/07/22 with recs for dysphagia 3/regular and thin liquids. CT abdomen revealed CTA with increasingly patulous appearance and fluid filling in the upper half of the thoracic esophagus. Esophagram was recommended per GI but order was D/Cd last admission.    Assessment / Plan / Recommendation  Clinical Impression  Pt presents with no s/s of an oropharyngeal dysphagia.  Oral mechanism exam revealed natural dentition; thrush-like appearance on tongue. She was reclined in bed and reluctant to sit upright due to back pain. There was thorough mastication of solids, the appearance of a brisk swallow response, and no s/s of aspiration.  Pt reports no hx of coughing/choking with PO intake.  Recommend that she continues current diet (regular solids/thin liquids) - no needs are identified. SLP will sign off. SLP Visit Diagnosis: Dysphagia, unspecified (R13.10)    Aspiration Risk  No limitations    Diet Recommendation   Regular solids, thin liquids  Medication Administration: Whole meds with liquid    Other  Recommendations Oral Care Recommendations: Oral care BID;Patient independent with oral care    Recommendations for follow up therapy are one component of a multi-disciplinary discharge planning process, led by the attending  physician.  Recommendations may be updated based on patient status, additional functional criteria and insurance authorization.  Follow up Recommendations No SLP follow up        [place] Date of Onset: 03/04/22 HPI: 87 year old female presented to ED with  generalized weakness, loss of appetite,  shortness of breath.  Prior medical history of essential hypertension, AAA, COPD, Raynaud's disease, scoliosis, recent admission for intractable back pain due to multiple spinal fractures status post kyphoplasty. Swallowing was evaluated during last admission on 02/07/22 with recs for dysphagia 3/regular and thin liquids. CT abdomen revealed CTA with increasingly patulous appearance and fluid filling in the upper half of the thoracic esophagus. Esophagram was recommended per GI but order was D/Cd last admission. Type of Study: Bedside Swallow Evaluation Previous Swallow Assessment: 02/07/22 Diet Prior to this Study: Regular;Thin liquids (Level 0) Temperature Spikes Noted: No Respiratory Status: Room air History of Recent Intubation: No Behavior/Cognition: Alert Oral Cavity Assessment: Within Functional Limits;Other (comment) (thrush-like appearance) Oral Care Completed by SLP: No Oral Cavity - Dentition: Adequate natural dentition Vision: Functional for self-feeding Self-Feeding Abilities: Able to feed self Patient Positioning: Upright in bed Baseline Vocal Quality: Normal Volitional Cough: Strong Volitional Swallow: Able to elicit    Oral/Motor/Sensory Function Overall Oral Motor/Sensory Function: Within functional limits   Ice Chips Ice chips: Within functional limits   Thin Liquid Thin Liquid: Within functional limits    Nectar Thick Nectar Thick Liquid: Not tested   Honey Thick Honey Thick Liquid: Not tested   Puree Puree: Within functional limits   Solid     Solid: Within functional limits      Stephanie Collier 03/04/2022,2:11 PM  Estill Bamberg L. Tivis Ringer, MA CCC/SLP Clinical Specialist - [place] Office number [phone]

## 2022-03-04 NOTE — Consult Note (Signed)
Consultation Note Date: 03/04/2022   Patient Name: Stephanie Collier  DOB: Nov 17, 1935  MRN: [id number]  Age / Sex: 87 y.o., female  PCP: [facility], [place] Referring Physician: Barb Merino, MD  Reason for Consultation: Establishing goals of care  HPI/Patient Profile: 87 y.o. female  with past medical history of essential hypertension, AAA, COPD, Raynaud's disease, scoliosis, and recent admission for intractable back pain due to multiple spinal fractures status post kyphoplasty admitted on 03/03/2022 with generalized weakness, loss of appetite, and shortness of breath.  Patient diagnosed with acute CHF.  Chest x-ray with cardiomegaly.  Also with elevated BNP.  She is being treated with IV Lasix.  Patient also found to have stable AAA and more progression of compression fractures of T8 and T10.  She was found with elevated TSH at 7.  PMT consulted to discuss goals of care.  Clinical Assessment and Goals of Care: I have reviewed medical records including EPIC notes, labs and imaging, assessed the patient and then met with patient to discuss diagnosis prognosis, GOC, EOL wishes, disposition and options.  I introduced Palliative Medicine as specialized medical care for people living with serious illness. It focuses on providing relief from the symptoms and stress of a serious illness. The goal is to improve quality of life for both the patient and the family.  Patient tells me she was quite functional prior to hospitalization earlier this month for compression fractures and surgery.  She tells me after that surgery she was discharged to rehab at [facility].  She tells me she only stayed there for 3 nights and then requested to go home.  She tells me the poor appetite started during this rehab stay.  She tells me since that time appetite continued to decline as did her function as she continued to get weaker.  She also tells me of worsening  shortness of breath during this time.   We discussed patient's current illness and what it means in the larger context of patient's on-going co-morbidities.  We discussed her new diagnoses of acute CHF as well as hypothyroidism.  We discussed measures being taken to treat these.  She is hopeful she will feel better with ongoing treatment.  She tells me her appetite seems to have picked up a little bit today.  I attempted to elicit values and goals of care important to the patient.    Discussed with patient the importance of continued conversation with family and the medical providers regarding overall plan of care and treatment options, ensuring decisions are within the context of the patients values and GOCs.    Patient tells me her goals are to continue to work with therapy and attempts to get stronger.  She tells me she will not go back to a facility for rehab but she is hopeful to get therapies in her home with home health.  Patient tells me about her decision for DNR/DNI.  We discussed this is appropriate.  Patient tells me she has a HCPOA document naming her daughter [name] as Chauncey Reading.  We discussed her symptoms and she tells me her pain is currently controlled with current interventions -Lidoderm patch.  She tells me she does not respond well to opioids -tells me about bad experiences with tramadol, oxycodone, and morphine.  She is hopeful to avoid these.  We did discuss the use of scheduled Tylenol but she is not interested in this either.  We discussed continuing current course and hopeful for improvement in function and appetite.  We discussed that if she does not feel she could go home and work with therapy it may make sense to consider the extra support of hospice in the home.  She would like to first try home health.  Questions and concerns were addressed. The family was encouraged to call with questions or concerns.  Primary Decision Maker PATIENT Daughter Inez Pilgrim if  patient unable to make decisions  SUMMARY OF RECOMMENDATIONS   Patient would like to continue current treatment in hopes of improvement in function and appetite as her acute CHF and hypothyroidism are managed She is interested in going home with home health in hopes of working with therapy and improving strength DNR/DNI confirmed  Code Status/Advance Care Planning: DNR     Primary Diagnoses: Present on Admission:  Compression fracture of body of thoracic vertebra ([place])   I have reviewed the medical record, interviewed the patient and family, and examined the patient. The following aspects are pertinent.  Past Medical History:  Diagnosis Date   AAA (abdominal aortic aneurysm) ([facility])    Arthritis    hip   Hypertension    no meds now   Osteoporosis    Patellar fracture    left   Raynaud's disease    Scoliosis    Social History   Socioeconomic History   Marital status: Divorced    Spouse name: Not on file   Number of children: Not on file   Years of education: Not on file   Highest education level: Not on file  Occupational History   Not on file  Tobacco Use   Smoking status: Never   Smokeless tobacco: Never  Vaping Use   Vaping Use: Never used  Substance and Sexual Activity   Alcohol use: Not Currently    Comment: rare wine   Drug use: No   Sexual activity: Not Currently    Birth control/protection: Post-menopausal  Other Topics Concern   Not on file  Social History Narrative   Not on file   Social Determinants of Health   Financial Resource Strain: Not on file  Food Insecurity: No Food Insecurity (02/08/2022)   Hunger Vital Sign    Worried About Running Out of Food in the Last Year: Never true    Ran Out of Food in the Last Year: Never true  Transportation Needs: No Transportation Needs (02/08/2022)   PRAPARE - Hydrologist (Medical): No    Lack of Transportation (Non-Medical): No  Physical Activity: Not on file  Stress: Not  on file  Social Connections: Not on file   Family History  Problem Relation Age of Onset   Breast cancer Daughter 41   Scheduled Meds:  furosemide  40 mg Intravenous Daily   levothyroxine  50 mcg Oral Q0600   lidocaine  1 patch Transdermal Q24H   nystatin  5 mL Oral QID   potassium chloride  40 mEq Oral BID   Continuous Infusions: PRN Meds:.hydrALAZINE, ipratropium-albuterol, naLOXone (NARCAN)  injection, oxyCODONE Allergies  Allergen Reactions   Fentanyl Shortness Of Breath   Acetaminophen Other (See Comments)    Constipation    Asa [Aspirin] Other (See Comments)    Per patient stomach cramps   Azithromycin Diarrhea   Calcium Carb-Magnesium Carb Other (See Comments)    skin lesions   Fosamax [Alendronate] Other (See Comments)    indigestion   Labetalol Hcl Other (See Comments)     indigestion   Review of Systems  Constitutional:  Positive  for activity change and appetite change.  Neurological:  Positive for weakness.    Physical Exam Constitutional:      General: She is not in acute distress.    Appearance: She is ill-appearing.  Pulmonary:     Effort: Pulmonary effort is normal.  Skin:    General: Skin is warm and dry.  Neurological:     Mental Status: She is alert and oriented to person, place, and time.     Vital Signs: BP 133/60   Pulse 80   Temp (!) 97.5 F (36.4 C) (Oral)   Resp (!) 22   SpO2 97%  Pain Scale: 0-10   Pain Score: 0-No pain   SpO2: SpO2: 97 % O2 Device:SpO2: 97 % O2 Flow Rate: .   IO: Intake/output summary:  Intake/Output Summary (Last 24 hours) at 03/04/2022 1446 Last data filed at 03/04/2022 0100 Gross per 24 hour  Intake --  Output 500 ml  Net -500 ml    LBM: Last BM Date : 03/02/22 Baseline Weight:   Most recent weight:       Palliative Assessment/Data: PPS 40%    *Please note that this is a verbal dictation therefore any spelling or grammatical errors are due to the "[employer]" system  interpretation.  Juel Burrow, DNP, AGNP-C Palliative Medicine Team [phone] Pager: [phone]

## 2022-03-04 NOTE — ED Notes (Signed)
Daughter and pt aware of room assignment.

## 2022-03-04 NOTE — Progress Notes (Signed)
PROGRESS NOTE    Stephanie Collier  [id number] DOB: Apr 29, 1935 DOA: 03/03/2022 PCP: [facility]    Brief Narrative:  87 year old female with history of essential hypertension, AAA, COPD, Raynaud's disease, scoliosis, recent admission for intractable back pain due to multiple spinal fractures status post kyphoplasty, comes back to the hospital with generalized weakness, loss of appetite, shortness of breath.  In the emergency room, slightly tachypneic but not hypoxemic.  100% on room air.  Afebrile.  BNP 1707.  COVID influenza RSV PCR negative.  Troponins 54.  TSH 7.  Admitted with possible new onset congestive heart failure.   Assessment & Plan:   Acute congestive heart failure, type unknown.  New onset. Demand ischemia. Hypokalemia:  Presented with shortness of breath, orthopnea and PND along with peripheral edema.  Chest x-ray with cardiomegaly.  BNP is greatly elevated. 2D echocardiogram today. Started on IV Lasix, will continue.  Blood pressure is borderline currently not able to tolerate additional GDMT.  Will wait for echocardiogram results. Daily weight Intake output monitoring Mildly elevated troponins, less likely acute coronary syndrome.  More likely demand ischemia. Replace potassium.  Palpable abdominal mass: CT scan consistent with abdominal aortic aneurysm. Patient does have extensive aortoiliac calcification with stable 4.4 x 4.5 cm fusiform distal infrarenal AAA.  She has been followed as outpatient by vascular surgery for this.  This has remained stable.  Recent multiple vertebral compression fractures: More progression of compression fractures of T8 and T10.  She is not a surgical candidate.  She had a kyphoplasty last month.  Conservative management.  COPD: DuoNebs as needed.  Not in exacerbation.  Elevated TSH: TSH 7.  T4 is 1.3.  Mildly elevated.  Will start on Synthroid 50 mcg.  Will need recheck in about a month.  Dysphagia and frailty:  She does have history of dysphagia.  Previously declined swallow study.  She is agreeable now.  Will consult speech therapy.  Progressive debility, frailty and failure to thrive: Patient continues to have poor appetite, dysphagia and progressive debility. Will treat all the treatable conditions, speech evaluation, treatment for fluid overload. Start working with PT OT to find safe disposition.  She lives at home with her daughter.  Daughter works in the daytime. She will benefit with palliative care consultation, may benefit with home hospice program.  Will explore with palliative care team.    DVT prophylaxis: SCDs Start: 03/03/22 2053   Code Status: DNR Family Communication: None.  Daughter [name] called, unable to communicate or leave a message. Disposition Plan: Status is: Observation The patient will require care spanning > 2 midnights and should be moved to inpatient because: Fluid overload, tachycardic, tachypneic.     Consultants:  Palliative care team  Procedures:  None  Antimicrobials:  None   Subjective: Patient seen and examined.  She is still in the emergency room.  Known to this provider from previous admission.  Patient tells me that she cannot eat enough, she does not have any appetite.  Also gets intermittent diarrhea and last diarrhea was more than 48 hours ago.  Denies any abdominal pain.  She feels very weak and tired.  No family at the bedside.  Objective: Vitals:   03/04/22 0830 03/04/22 0842 03/04/22 0900 03/04/22 0930  BP: (!) 146/63  (!) 130/57 (!) 141/68  Pulse:      Resp: (!) 21  20 (!) 26  Temp:  97.7 F (36.5 C)    TempSrc:  Oral    SpO2: 97%  95% 98%    Intake/Output Summary (Last 24 hours) at 03/04/2022 1104 Last data filed at 03/04/2022 0100 Gross per 24 hour  Intake --  Output 500 ml  Net -500 ml   There were no vitals filed for this visit.  Examination:  General: Frail and debilitated.  Chronically sick looking.  On room air.   Cachectic. Cardiovascular: S1-S2 normal.  Tachycardic.  No murmurs. Respiratory: No added sounds. Gastrointestinal: Soft.  Nontender.  Thin scaphoid abdomen with palpable pulsating aortic aneurysm. Ext: Trace bilateral pedal edema.  No cyanosis. Neuro: Alert awake and oriented x 4.  Profound generalized weakness. Musculoskeletal: No deformities.     Data Reviewed: I have personally reviewed following labs and imaging studies  CBC: Recent Labs  Lab 03/03/22 1243  WBC 5.2  HGB 13.5  HCT 43.0  MCV 97.1  PLT [id number]   Basic Metabolic Panel: Recent Labs  Lab 03/03/22 1243 03/04/22 0219  NA 136 142  K 3.5 3.3*  CL 101 97*  CO2 25 28  GLUCOSE 123* 98  BUN 17 12  CREATININE 0.70 0.72  CALCIUM 9.2 9.0   GFR: CrCl cannot be calculated (Unknown ideal weight.). Liver Function Tests: Recent Labs  Lab 03/04/22 0219  AST 23  ALT 14  ALKPHOS 78  BILITOT 2.3*  PROT 6.0*  ALBUMIN 3.6   No results for input(s): "LIPASE", "AMYLASE" in the last 168 hours. No results for input(s): "AMMONIA" in the last 168 hours. Coagulation Profile: No results for input(s): "INR", "PROTIME" in the last 168 hours. Cardiac Enzymes: No results for input(s): "CKTOTAL", "CKMB", "CKMBINDEX", "TROPONINI" in the last 168 hours. BNP (last 3 results) No results for input(s): "PROBNP" in the last 8760 hours. HbA1C: No results for input(s): "HGBA1C" in the last 72 hours. CBG: No results for input(s): "GLUCAP" in the last 168 hours. Lipid Profile: No results for input(s): "CHOL", "HDL", "LDLCALC", "TRIG", "CHOLHDL", "LDLDIRECT" in the last 72 hours. Thyroid Function Tests: Recent Labs    03/03/22 1224 03/03/22 1652  TSH  --  7.095*  FREET4 1.34*  --    Anemia Panel: No results for input(s): "VITAMINB12", "FOLATE", "FERRITIN", "TIBC", "IRON", "RETICCTPCT" in the last 72 hours. Sepsis Labs: No results for input(s): "PROCALCITON", "LATICACIDVEN" in the last 168 hours.  Recent Results (from the  past 240 hour(s))  Resp panel by RT-PCR (RSV, Flu A&B, Covid) Anterior Nasal Swab     Status: None   Collection Time: 03/03/22  3:56 PM   Specimen: Anterior Nasal Swab  Result Value Ref Range Status   SARS Coronavirus 2 by RT PCR NEGATIVE NEGATIVE Final   Influenza A by PCR NEGATIVE NEGATIVE Final   Influenza B by PCR NEGATIVE NEGATIVE Final    Comment: (NOTE) The Xpert Xpress SARS-CoV-2/FLU/RSV plus assay is intended as an aid in the diagnosis of influenza from Nasopharyngeal swab specimens and should not be used as a sole basis for treatment. Nasal washings and aspirates are unacceptable for Xpert Xpress SARS-CoV-2/FLU/RSV testing.  Fact Sheet for Patients: EntrepreneurPulse.com.au  Fact Sheet for Healthcare Providers: IncredibleEmployment.be  This test is not yet approved or cleared by the [place] FDA and has been authorized for detection and/or diagnosis of SARS-CoV-2 by FDA under an Emergency Use Authorization (EUA). This EUA will remain in effect (meaning this test can be used) for the duration of the COVID-19 declaration under Section 564(b)(1) of the Act, 21 U.S.C. section 360bbb-3(b)(1), unless the authorization is terminated or revoked.     Resp Syncytial Virus  by PCR NEGATIVE NEGATIVE Final    Comment: (NOTE) Fact Sheet for Patients: EntrepreneurPulse.com.au  Fact Sheet for Healthcare Providers: IncredibleEmployment.be  This test is not yet approved or cleared by the [place] FDA and has been authorized for detection and/or diagnosis of SARS-CoV-2 by FDA under an Emergency Use Authorization (EUA). This EUA will remain in effect (meaning this test can be used) for the duration of the COVID-19 declaration under Section 564(b)(1) of the Act, 21 U.S.C. section 360bbb-3(b)(1), unless the authorization is terminated or revoked.  Performed at [facility], [place] [address]  [place], [place] [address]          Radiology Studies: CT ABDOMEN PELVIS WO CONTRAST  Result Date: 03/03/2022 CLINICAL DATA:  Abdominal mass with decreased appetite. EXAM: CT ABDOMEN AND PELVIS WITHOUT CONTRAST TECHNIQUE: Multidetector CT imaging of the abdomen and pelvis was performed following the standard protocol without IV contrast. RADIATION DOSE REDUCTION: This exam was performed according to the departmental dose-optimization program which includes automated exposure control, adjustment of the mA and/or kV according to patient size and/or use of iterative reconstruction technique. COMPARISON:  CTA chest today, CTA chest, abdomen and pelvis 02/07/2022 FINDINGS: Lower chest: There is moderate pan chamber cardiomegaly, through three-vessel coronary artery calcifications and a small chronic pericardial effusion. Lung bases show scarring changes and posterior atelectasis without infiltrates. Hepatobiliary: Limited visualization due to artifact from overlying wires and the patient's arms in the field. No obvious focal liver abnormality. Gallbladder is contracted and poorly seen. No biliary dilatation. Pancreas: Limited visualization due to the patient's arms and overlying wires causing streak artifact. No obvious abnormality. 7 mm lipoma was previously seen posteriorly in the pancreatic neck. Spleen: No obvious abnormality. Adrenals/Urinary Tract: No adrenal or renal masses seen. There is mild bilateral renal volume loss. There is contrast in the collecting systems. No hydronephrosis or ureterectasis is seen. There is contrast opacification of the unremarkable bladder. The wall and lumen are unremarkable. Stomach/Bowel: No dilatation or wall thickening is visible without contrast. An appendix is not seen. Uncomplicated sigmoid diverticulosis. Vascular/Lymphatic: No adenopathy is seen. There is extensive aortoiliac calcification. There is calcification in the visceral branch arteries. 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 Reproductive: Uterus and bilateral adnexa are unremarkable. Other: There is no free air, free hemorrhage or free fluid. Musculoskeletal: Osteopenia. Moderate L3 and 4 compression fractures have been treated with kyphoplasty in the interval. There is progressive compression fracture of the T8 vertebral body with about 30% overall height loss. Old left hip nailing. Degenerative change and dextro rotary scoliosis lumbar spine. IMPRESSION: 1. No acute noncontrast CT abnormality is seen in the abdomen or pelvis. 2. Cardiomegaly with small chronic pericardial effusion. 3. Aortic and coronary artery atherosclerosis. 4. Extensive aortoiliac calcifications and stable 4.4 x 4.5 cm fusiform distal infrarenal AAA. Possible that the aneurysm could be palpated as a mass because it is in between the abdominal wall and spine. Recommend follow-up every 6 months and vascular consultation. Reference: J Am Coll Radiol [id number]. 5. 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 6. Osteopenia, scoliosis and degenerative change. 7. Progressive T8 compression fracture with about 30% height loss. 8. L3 and L4  moderate compression fractures have been treated with kyphoplasty in the interval. No increased height loss at either level. Aortic Atherosclerosis (ICD10-I70.0). Electronically Signed   By: Telford Nab M.D.   On: 03/03/2022 21:56   CT Angio Chest PE W and/or Wo Contrast  Result Date: 03/03/2022 CLINICAL DATA:  Pulmonary embolism suspected. High probability. Recent lumbar surgery. EXAM: CT ANGIOGRAPHY CHEST WITH CONTRAST TECHNIQUE: Multidetector CT imaging of the chest was performed using  the standard protocol during bolus administration of intravenous contrast. Multiplanar CT image reconstructions and MIPs were obtained to evaluate the vascular anatomy. RADIATION DOSE REDUCTION: This exam was performed according to the departmental dose-optimization program which includes automated exposure control, adjustment of the mA and/or kV according to patient size and/or use of iterative reconstruction technique. CONTRAST:  26m OMNIPAQUE IOHEXOL 350 MG/ML SOLN COMPARISON:  02/07/2022 FINDINGS: Cardiovascular: Chronic cardiomegaly. Coronary artery calcification. Aortic atherosclerotic calcification. Thoracic aortic stent without complicating feature. This is not opacified by contrast as this exam is targeted towards the pulmonary arteries. Pulmonary arterial opacification is good. No pulmonary emboli. Mediastinum/Nodes: No mass or adenopathy. Lungs/Pleura: Chronic upper lobe pulmonary scarring. Minimal dependent atelectasis. No pleural effusion. No sign of pneumonia or mass lesion. Upper Abdomen: Negative Musculoskeletal: Thoracic compression fractures at T5, T6 and T7 appear the same as they did on the prior exam. There has been progression of compression fractures at T8 and T10. Review of the MIP images confirms the above findings. IMPRESSION: 1. No pulmonary emboli. 2. Chronic cardiomegaly. Coronary artery calcification. Aortic stent without visible complicating feature. 3. Chronic upper lobe pulmonary scarring. 4. Thoracic compression fractures at T5, T6 and T7 appear the same as they did on the prior exam. There has been progression of compression fractures at T8 and T10. Aortic Atherosclerosis (ICD10-I70.0). Electronically Signed   By: MNelson ChimesM.D.   On: 03/03/2022 18:21   DG Chest 2 View  Result Date: 03/03/2022 CLINICAL DATA:  Shortness of breath. EXAM: CHEST - 2 VIEW COMPARISON:  Chest radiograph 06/03/2021 and CTA 02/07/2022 FINDINGS: The cardiac silhouette remains enlarged. A descending  thoracic aortic stent graft is again noted. There is chronic interstitial coarsening. Increased, mild densities in the lung bases compared to the prior radiograph may reflect atelectasis and possibly trace pleural effusions. No pneumothorax is identified. Thoracolumbar scoliosis and multiple compression fractures are again noted. IMPRESSION: Increased bibasilar opacities which may reflect atelectasis and possible trace pleural effusions. Electronically Signed   By: ALogan BoresM.D.   On: 03/03/2022 13:16        Scheduled Meds:  furosemide  40 mg Intravenous Daily   levothyroxine  50 mcg Oral Q0600   lidocaine  1 patch Transdermal Q24H   nystatin  5 mL Oral QID   potassium chloride  40 mEq Oral BID   Continuous Infusions:   LOS: 0 days    Time spent: 35 minutes    KBarb Merino MD [facility] Pager [phone]

## 2022-03-04 NOTE — ED Notes (Signed)
Irene Pap MD aware of elevated troponin levels. Orders received.

## 2022-03-04 NOTE — Progress Notes (Addendum)
Heart Failure Nurse Navigator Progress Note  Assessed for HV TOC readiness. Resting in bed, HOB 30, on room air, able to speak in full sentences. Pt states she is very weak after her back surgery and has a hard time getting in  and out of vehicles. Lives with dtr, dtr drives her to/from appts. Pt uses walker at home.   No plans for HV TOC appt upon discharge d/t limited mobility.   HF Navigation team will sign-off. Please re-consult as needed.    Pricilla Holm, MSN, RN Heart Failure Nurse Navigator

## 2022-03-05 ENCOUNTER — Inpatient Hospital Stay (HOSPITAL_COMMUNITY): Payer: Medicare Other

## 2022-03-05 DIAGNOSIS — J42 Unspecified chronic bronchitis: Secondary | ICD-10-CM | POA: Diagnosis not present

## 2022-03-05 DIAGNOSIS — R0602 Shortness of breath: Secondary | ICD-10-CM

## 2022-03-05 DIAGNOSIS — R52 Pain, unspecified: Secondary | ICD-10-CM

## 2022-03-05 DIAGNOSIS — I10 Essential (primary) hypertension: Secondary | ICD-10-CM

## 2022-03-05 DIAGNOSIS — R630 Anorexia: Secondary | ICD-10-CM | POA: Diagnosis not present

## 2022-03-05 DIAGNOSIS — I5033 Acute on chronic diastolic (congestive) heart failure: Secondary | ICD-10-CM

## 2022-03-05 DIAGNOSIS — Z515 Encounter for palliative care: Secondary | ICD-10-CM | POA: Diagnosis not present

## 2022-03-05 DIAGNOSIS — R946 Abnormal results of thyroid function studies: Secondary | ICD-10-CM | POA: Diagnosis not present

## 2022-03-05 DIAGNOSIS — Z7189 Other specified counseling: Secondary | ICD-10-CM | POA: Diagnosis not present

## 2022-03-05 DIAGNOSIS — I5021 Acute systolic (congestive) heart failure: Secondary | ICD-10-CM | POA: Diagnosis not present

## 2022-03-05 DIAGNOSIS — R531 Weakness: Secondary | ICD-10-CM | POA: Diagnosis not present

## 2022-03-05 DIAGNOSIS — I509 Heart failure, unspecified: Secondary | ICD-10-CM | POA: Diagnosis not present

## 2022-03-05 LAB — CBC WITH DIFFERENTIAL/PLATELET
Abs Immature Granulocytes: 0.02 10*3/uL (ref 0.00–0.07)
Basophils Absolute: 0 10*3/uL (ref 0.0–0.1)
Basophils Relative: 1 %
Eosinophils Absolute: 0.1 10*3/uL (ref 0.0–0.5)
Eosinophils Relative: 1 %
HCT: 41.5 % (ref 36.0–46.0)
Hemoglobin: 13.8 g/dL (ref 12.0–15.0)
Immature Granulocytes: 0 %
Lymphocytes Relative: 21 %
Lymphs Abs: 1.3 10*3/uL (ref 0.7–4.0)
MCH: 31.1 pg (ref 26.0–34.0)
MCHC: 33.3 g/dL (ref 30.0–36.0)
MCV: 93.5 fL (ref 80.0–100.0)
Monocytes Absolute: 0.8 10*3/uL (ref 0.1–1.0)
Monocytes Relative: 12 %
Neutro Abs: 3.9 10*3/uL (ref 1.7–7.7)
Neutrophils Relative %: 65 %
Platelets: 162 10*3/uL (ref 150–400)
RBC: 4.44 MIL/uL (ref 3.87–5.11)
RDW: 14.6 % (ref 11.5–15.5)
WBC: 6.1 10*3/uL (ref 4.0–10.5)
nRBC: 0 % (ref 0.0–0.2)

## 2022-03-05 LAB — BASIC METABOLIC PANEL
Anion gap: 12 (ref 5–15)
BUN: 14 mg/dL (ref 8–23)
CO2: 28 mmol/L (ref 22–32)
Calcium: 9.1 mg/dL (ref 8.9–10.3)
Chloride: 99 mmol/L (ref 98–111)
Creatinine, Ser: 0.69 mg/dL (ref 0.44–1.00)
GFR, Estimated: >60 mL/min (ref >60)
Glucose, Bld: 122 mg/dL — ABNORMAL HIGH (ref 70–99)
Potassium: 4.2 mmol/L (ref 3.5–5.1)
Sodium: 139 mmol/L (ref 135–145)

## 2022-03-05 LAB — MAGNESIUM: Magnesium: 1.8 mg/dL (ref 1.7–2.4)

## 2022-03-05 LAB — PHOSPHORUS: Phosphorus: 3.7 mg/dL (ref 2.5–4.6)

## 2022-03-05 MED ORDER — ENOXAPARIN SODIUM 40 MG/0.4ML IJ SOSY: 40.0000 mg | PREFILLED_SYRINGE | INTRAMUSCULAR | Status: DC

## 2022-03-05 MED ORDER — HYDROXYZINE HCL 10 MG/5ML PO SYRP
10.0000 mg | ORAL_SOLUTION | Freq: Once | ORAL | Status: AC
  Administered 2022-03-05: 10 mg via ORAL
  Filled 2022-03-05 (×2): qty 5

## 2022-03-05 MED ORDER — ENOXAPARIN SODIUM 30 MG/0.3ML IJ SOSY
30.0000 mg | PREFILLED_SYRINGE | INTRAMUSCULAR | Status: DC
  Administered 2022-03-05 – 2022-03-11 (×7): 30 mg via SUBCUTANEOUS
  Filled 2022-03-05 (×7): qty 0.3

## 2022-03-05 MED ORDER — METOPROLOL TARTRATE 12.5 MG HALF TABLET
12.5000 mg | ORAL_TABLET | Freq: Two times a day (BID) | ORAL | Status: DC
  Administered 2022-03-05 – 2022-03-07 (×6): 12.5 mg via ORAL
  Filled 2022-03-05 (×8): qty 1

## 2022-03-05 MED ORDER — ALPRAZOLAM 0.25 MG PO TABS
0.2500 mg | ORAL_TABLET | Freq: Three times a day (TID) | ORAL | Status: DC | PRN
  Administered 2022-03-05 – 2022-03-10 (×10): 0.25 mg via ORAL
  Filled 2022-03-05 (×10): qty 1

## 2022-03-05 NOTE — Consult Note (Addendum)
Cardiology Consultation   Patient ID: Stephanie Collier MRN: [id number]; DOB: 07/20/1935  Admit date: 03/03/2022 Date of Consult: 03/05/2022  PCP:  [facility] Providers Cardiologist:  None        Patient Profile:   Stephanie Collier is a 87 y.o. female with a hx of hypertension, thoracic aortic aneurysm, COPD, Raynaud's, osteoporosis, scoliosis, recent vertebral compression fractures s/p kyphoplasty who is being seen 03/05/2022 for the evaluation of CHF and abnormal echocardiogram at the request of Dr. Sloan Leiter.  History of Present Illness:   Stephanie Collier presented to the hospital on 2/21 with symptoms of malaise, decreased appetite, dyspnea. She recently underwent kyphoplasty during an admission for vertebral compression fractures from 1/27-2/2. Since leaving the hospital, she has felt weak with orthopnea at night and some exertional dyspnea. Patient describes sometimes waking up at night and "feeling like she's suffocating." She does not recall experiencing these symptoms outside of the past month or so. Denies lower extremity edema, chest pain, palpitations. Patient generally gave very brief answers to questions on my exam and obtaining full HPI was challenging.  Workup to this point includes CTA chest which was without PE. Cardiomegaly noted with coronary artery calcifications. Aortic stent without visible complications. CT abdomen pelvis with stable 4.4x4.5 cm fusiform distal infrarenal AAA. BNP elevated at 1707.4. Troponin elevated but flat at 54->63->65->64->59. TTE with newly reduced LVEF 30-35% with regional wall motion abnormalities as well as independently mobile echodensity 0.7 x 0.7 cm in RV outflow tract.   Past Medical History:  Diagnosis Date   AAA (abdominal aortic aneurysm) (HCC)    Arthritis    hip   Hypertension    no meds now   Osteoporosis    Patellar fracture    left   Raynaud's disease    Scoliosis     Past Surgical History:   Procedure Laterality Date   APPENDECTOMY     at age 64   BREAST BIOPSY Left    CHEST TUBE INSERTION Left 04/04/2014   Procedure: CHEST TUBE INSERTION;  Surgeon: Serafina Mitchell, MD;  Location: [facility];  Service: Vascular;  Laterality: Left;  by Dr. Vernetta Honey   INSERTION OF VENA CAVA FILTER N/A 04/02/2014   Procedure: INSERTION OF VENA CAVA FILTER;  Surgeon: Serafina Mitchell, MD;  Location: [facility];  Service: Cardiovascular;  Laterality: N/A;   INTRAMEDULLARY (IM) NAIL INTERTROCHANTERIC Left 06/04/2021   Procedure: INTRAMEDULLARY (IM) NAIL INTERTROCHANTRIC;  Surgeon: Georgeanna Harrison, MD;  Location: [facility];  Service: Orthopedics;  Laterality: Left;   IR KYPHO EA ADDL LEVEL THORACIC OR LUMBAR  02/10/2022   IR KYPHO LUMBAR INC FX REDUCE BONE BX UNI/BIL CANNULATION INC/IMAGING  02/10/2022   ORIF PATELLA Left 11/20/2020   Procedure: OPEN REDUCTION INTERNAL (ORIF) FIXATION PATELLA;  Surgeon: Willaim Sheng, MD;  Location: [facility];  Service: Orthopedics;  Laterality: Left;   PERIPHERAL VASCULAR CATHETERIZATION N/A 06/26/2014   Procedure: IVC Filter Removal;  Surgeon: Serafina Mitchell, MD;  Location: [facility];  Service: Cardiovascular;  Laterality: N/A;   THORACIC AORTIC ENDOVASCULAR STENT GRAFT N/A 04/04/2014   Procedure: THORACIC AORTIC ENDOVASCULAR STENT GRAFT;  Surgeon: Serafina Mitchell, MD;  Location: [facility];  Service: Vascular;  Laterality: N/A;     Home Medications:  Prior to Admission medications   Medication Sig Start Date End Date Taking? Authorizing Provider  acetaminophen (TYLENOL) 325 MG tablet Take 2 tablets (650 mg total) by mouth  every 6 (six) hours as needed for mild pain (or Fever >/= 101). 02/12/22   Barb Merino, MD  calcitonin, salmon, (MIACALCIN/FORTICAL) 200 UNIT/ACT nasal spray Place 1 spray into alternate nostrils daily. 02/12/22   Barb Merino, MD  docusate sodium (COLACE) 100 MG capsule Take 1 capsule (100 mg total) by mouth 2 (two) times daily.  02/12/22   Barb Merino, MD  feeding supplement (ENSURE ENLIVE / ENSURE PLUS) LIQD Take 237 mLs by mouth 2 (two) times daily between meals. 02/12/22   Barb Merino, MD  lidocaine (LIDODERM) 5 % Place 1-3 patches onto the skin daily. Remove & Discard patch within 12 hours or as directed by MD 02/12/22   Barb Merino, MD  polyethylene glycol (MIRALAX / GLYCOLAX) 17 g packet Take 17 g by mouth daily as needed for mild constipation or moderate constipation. 02/12/22   Barb Merino, MD  traMADol (ULTRAM) 50 MG tablet Take 50 mg by mouth daily as needed. 01/22/22   [provider]    Inpatient Medications: Scheduled Meds:  enoxaparin (LOVENOX) injection  30 mg Subcutaneous Q24H   furosemide  40 mg Intravenous Daily   levothyroxine  50 mcg Oral Q0600   lidocaine  1 patch Transdermal Q24H   nystatin  5 mL Oral QID   potassium chloride  40 mEq Oral BID   Continuous Infusions:  PRN Meds: ALPRAZolam, hydrALAZINE, ipratropium-albuterol, naLOXone (NARCAN)  injection, oxyCODONE  Allergies:    Allergies  Allergen Reactions   Fentanyl Shortness Of Breath   Acetaminophen Other (See Comments)    Constipation    Asa [Aspirin] Other (See Comments)    Per patient stomach cramps   Azithromycin Diarrhea   Calcium Carb-Magnesium Carb Other (See Comments)    skin lesions   Fosamax [Alendronate] Other (See Comments)    indigestion   Labetalol Hcl Other (See Comments)     indigestion    Social History:   Social History   Socioeconomic History   Marital status: Divorced    Spouse name: Not on file   Number of children: Not on file   Years of education: Not on file   Highest education level: Not on file  Occupational History   Not on file  Tobacco Use   Smoking status: Never   Smokeless tobacco: Never  Vaping Use   Vaping Use: Never used  Substance and Sexual Activity   Alcohol use: Not Currently    Comment: rare wine   Drug use: No   Sexual activity: Not Currently    Birth  control/protection: Post-menopausal  Other Topics Concern   Not on file  Social History Narrative   Not on file   Social Determinants of Health   Financial Resource Strain: Not on file  Food Insecurity: No Food Insecurity (02/08/2022)   Hunger Vital Sign    Worried About Running Out of Food in the Last Year: Never true    Ran Out of Food in the Last Year: Never true  Transportation Needs: No Transportation Needs (02/08/2022)   PRAPARE - Hydrologist (Medical): No    Lack of Transportation (Non-Medical): No  Physical Activity: Not on file  Stress: Not on file  Social Connections: Not on file  Intimate Partner Violence: Not At Risk (02/08/2022)   Humiliation, Afraid, Rape, and Kick questionnaire    Fear of Current or Ex-Partner: No    Emotionally Abused: No    Physically Abused: No    Sexually Abused: No  Family History:    Family History  Problem Relation Age of Onset   Breast cancer Daughter 38     ROS:  Please see the history of present illness.   All other ROS reviewed and negative.     Physical Exam/Data:   Vitals:   03/05/22 0044 03/05/22 0500 03/05/22 0520 03/05/22 0733  BP: (!) 142/76  138/72 (!) 141/74  Pulse: 90  84 93  Resp: '17  15 20  '$ Temp: 97.7 F (36.5 C)  (!) 97 F (36.1 C) 97.7 F (36.5 C)  TempSrc: Oral  Oral Oral  SpO2: 94%  94% 94%  Weight: 45.5 kg 45.1 kg      Intake/Output Summary (Last 24 hours) at 03/05/2022 1154 Last data filed at 03/05/2022 0700 Gross per 24 hour  Intake 420 ml  Output 525 ml  Net -105 ml      03/05/2022    5:00 AM 03/05/2022   12:44 AM 02/06/2022    9:11 PM  Last 3 Weights  Weight (lbs) 99 lb 6.8 oz 100 lb 5 oz 97 lb  Weight (kg) 45.1 kg 45.5 kg 43.999 kg     Body mass index is 19.42 kg/m.  General:  Patient is anxious and tired appearing. Tachypnea also  HEENT: normal Neck: JVP visible to mandible Vascular: No carotid bruits; Distal pulses 2+ bilaterally Cardiac:  normal S1,  S2; RRR; no murmur  Lungs: bibasilar crackles noted Abd: soft, nontender, no hepatomegaly  Ext: no edema Musculoskeletal:  No deformities, BUE and BLE strength normal and equal Skin: warm and dry  Neuro:  CNs 2-12 intact, no focal abnormalities noted Psych:  Normal affect   EKG:  The EKG from 2/22 was personally reviewed and demonstrates: sinus rhythm. LVH with borderline wide QRS. ST elevation is noted in V2-V4. Of note, patient's previous ECG in May of 2023 had diffuse T wave inversion that has since resolved.   Telemetry:  Telemetry was personally reviewed and demonstrates:  sinus rhythm  Relevant CV Studies:  03/04/22 TTE  IMPRESSIONS     1. Independently mobile echodensity 0.7 x 0.7 cm, which appears to be in  the right ventricular outflow tract based on parasternal imaging. Image  17-19 best demonstrate, not well seen elsewhere. Consider TEE if  clinically relevant.   2. The aortic valve was not well visualized. There is moderate  calcification of the aortic valve. Aortic valve regurgitation is severe.  Aortic valve sclerosis/calcification is present, without any evidence of  aortic stenosis. Stroke volume index 34  ml/m2 and DVI 0.43.   3. Left ventricular ejection fraction, by estimation, is 30 to 35%. The  left ventricle has moderately decreased function. The left ventricle  demonstrates regional wall motion abnormalities (see scoring  diagram/findings for description). There is mild  left ventricular hypertrophy. Left ventricular diastolic parameters are  indeterminate.   4. Right ventricular systolic function is normal. The right ventricular  size is normal. Tricuspid regurgitation signal is inadequate for assessing  PA pressure.   5. The mitral valve is grossly normal. Mild mitral valve regurgitation.   6. The inferior vena cava is normal in size with greater than 50%  respiratory variability, suggesting right atrial pressure of 3 mmHg.   Comparison(s): A prior  study was performed on 06/04/21. Prior images  reviewed side by side. Aortic valve regurgitation appears similar. LV  function has decreased.   FINDINGS   Left Ventricle: Left ventricular ejection fraction, by estimation, is 30  to 35%. The  left ventricle has moderately decreased function. The left  ventricle demonstrates regional wall motion abnormalities. The left  ventricular internal cavity size was  normal in size. There is mild left ventricular hypertrophy. Left  ventricular diastolic parameters are indeterminate.   Right Ventricle: The right ventricular size is normal. Right vetricular  wall thickness was not well visualized. Right ventricular systolic  function is normal. Tricuspid regurgitation signal is inadequate for  assessing PA pressure.   Left Atrium: Left atrial size was normal in size.   Right Atrium: Right atrial size was normal in size.   Pericardium: There is no evidence of pericardial effusion.   Mitral Valve: The mitral valve is grossly normal. Mild mitral valve  regurgitation.   Tricuspid Valve: The tricuspid valve is not well visualized. Tricuspid  valve regurgitation is trivial.   Aortic Valve: The aortic valve was not well visualized. There is moderate  calcification of the aortic valve. Aortic valve regurgitation is severe.  Aortic regurgitation PHT measures 353 msec. Aortic valve  sclerosis/calcification is present, without any  evidence of aortic stenosis. Aortic valve mean gradient measures 6.0 mmHg.  Aortic valve peak gradient measures 9.8 mmHg. Aortic valve area, by VTI  measures 1.79 cm.   Pulmonic Valve: The pulmonic valve was not well visualized. Pulmonic valve  regurgitation is trivial.   Aorta: The aortic root is normal in size and structure and the ascending  aorta was not well visualized.   Venous: The inferior vena cava is normal in size with greater than 50%  respiratory variability, suggesting right atrial pressure of 3 mmHg.    IAS/Shunts: The interatrial septum was not well visualized.   06/04/21 TTE  IMPRESSIONS     1. LV apical false tendon (normal variant). Left ventricular ejection  fraction, by estimation, is 60 to 65%. Left ventricular ejection fraction  by PLAX is 60 %. The left ventricle has normal function. The left  ventricle has no regional wall motion  abnormalities. Left ventricular diastolic parameters are consistent with  Grade I diastolic dysfunction (impaired relaxation).   2. Right ventricular systolic function is normal. The right ventricular  size is normal.   3. Left atrial size was mildly dilated.   4. The mitral valve is abnormal. Trivial mitral valve regurgitation.   5. The aortic valve is tricuspid. There is moderate calcification of the  aortic valve. Aortic valve regurgitation is moderate. Mild to moderate  aortic valve stenosis. Aortic regurgitation PHT measures 407 msec. Aortic  valve area, by VTI measures 1.42  cm. Aortic valve mean gradient measures 8.0 mmHg. Aortic valve Vmax  measures 1.98 m/s. Peak gradient 15.6 mmHg. Sharma Covert is 0.45.   Comparison(s): No prior Echocardiogram.   FINDINGS   Left Ventricle: LV apical false tendon (normal variant). Left ventricular  ejection fraction, by estimation, is 60 to 65%. Left ventricular ejection  fraction by PLAX is 60 %. The left ventricle has normal function. The left  ventricle has no regional wall   motion abnormalities. The left ventricular internal cavity size was  normal in size. There is no left ventricular hypertrophy. Left ventricular  diastolic parameters are consistent with Grade I diastolic dysfunction  (impaired relaxation). Normal left  ventricular filling pressure.   Right Ventricle: The right ventricular size is normal. No increase in  right ventricular wall thickness. Right ventricular systolic function is  normal.   Left Atrium: Left atrial size was mildly dilated.   Right Atrium: Right atrial size was  normal in size.  Pericardium: There is no evidence of pericardial effusion.   Mitral Valve: The mitral valve is abnormal. There is mild thickening of  the anterior and posterior mitral valve leaflet(s). Trivial mitral valve  regurgitation. MV peak gradient, 5.1 mmHg. The mean mitral valve gradient  is 2.0 mmHg.   Tricuspid Valve: The tricuspid valve is grossly normal. Tricuspid valve  regurgitation is trivial.   Aortic Valve: The aortic valve is tricuspid. There is moderate  calcification of the aortic valve. Aortic valve regurgitation is moderate.  Aortic regurgitation PHT measures 407 msec. Mild to moderate aortic  stenosis is present. Aortic valve mean gradient  measures 8.0 mmHg. Aortic valve peak gradient measures 15.6 mmHg. Aortic  valve area, by VTI measures 1.42 cm.   Pulmonic Valve: The pulmonic valve was normal in structure. Pulmonic valve  regurgitation is not visualized.   Aorta: The aortic root and ascending aorta are structurally normal, with  no evidence of dilitation.   Venous: The inferior vena cava was not well visualized.   IAS/Shunts: No atrial level shunt detected by color flow Doppler.   Laboratory Data:  High Sensitivity Troponin:   Recent Labs  Lab 03/03/22 1651 03/04/22 0036 03/04/22 0219 03/04/22 0337 03/04/22 0556  TROPONINIHS 54* 63* 65* 64* 59*     Chemistry Recent Labs  Lab 03/03/22 1243 03/04/22 0219 03/05/22 0132  NA 136 142 139  K 3.5 3.3* 4.2  CL 101 97* 99  CO2 '25 28 28  '$ GLUCOSE 123* 98 122*  BUN '17 12 14  '$ CREATININE 0.70 0.72 0.69  CALCIUM 9.2 9.0 9.1  MG  --   --  1.8  GFRNONAA >60 >60 >60  ANIONGAP 10 17* 12    Recent Labs  Lab 03/04/22 0219  PROT 6.0*  ALBUMIN 3.6  AST 23  ALT 14  ALKPHOS 78  BILITOT 2.3*   Lipids No results for input(s): "CHOL", "TRIG", "HDL", "LABVLDL", "LDLCALC", "CHOLHDL" in the last 168 hours.  Hematology Recent Labs  Lab 03/03/22 1243 03/05/22 0132  WBC 5.2 6.1  RBC 4.43 4.44   HGB 13.5 13.8  HCT 43.0 41.5  MCV 97.1 93.5  MCH 30.5 31.1  MCHC 31.4 33.3  RDW 14.8 14.6  PLT 159 162   Thyroid  Recent Labs  Lab 03/03/22 1224 03/03/22 1652  TSH  --  7.095*  FREET4 1.34*  --     BNP Recent Labs  Lab 03/03/22 1243  BNP 1,707.4*    DDimer No results for input(s): "DDIMER" in the last 168 hours.   Radiology/Studies:  VAS [place] LOWER EXTREMITY VENOUS (DVT)  Result Date: 03/05/2022  Lower Venous DVT Study Patient Name:  Stephanie Collier  Date of Exam:   03/05/2022 Medical Rec #: [id number]   Accession #:    [id number] Date of Birth: 1935/05/27   Patient Gender: F Patient Age:   87 years Exam Location:  [facility] Procedure:      VAS [place] LOWER EXTREMITY VENOUS (DVT) Referring Phys: Barb Merino --------------------------------------------------------------------------------  Indications: Pain, SOB, and weakness.  Risk Factors: DVT hx RLE 04/01/14. Comparison Study: Previous bilateral 04/01/14 right (positive) left (negative) Performing Technologist: McKayla Maag  Examination Guidelines: A complete evaluation includes B-mode imaging, spectral Doppler, color Doppler, and power Doppler as needed of all accessible portions of each vessel. Bilateral testing is considered an integral part of a complete examination. Limited examinations for reoccurring indications may be performed as noted. The reflux portion of the exam is performed with the patient in reverse  Trendelenburg.  +---------+---------------+---------+-----------+----------+--------------+ RIGHT    CompressibilityPhasicitySpontaneityPropertiesThrombus Aging +---------+---------------+---------+-----------+----------+--------------+ CFV      Full           Yes      Yes                                 +---------+---------------+---------+-----------+----------+--------------+ SFJ      Full                                                         +---------+---------------+---------+-----------+----------+--------------+ FV Prox  Full                                                        +---------+---------------+---------+-----------+----------+--------------+ FV Mid   Full                                                        +---------+---------------+---------+-----------+----------+--------------+ FV DistalFull                                                        +---------+---------------+---------+-----------+----------+--------------+ PFV      Full                                                        +---------+---------------+---------+-----------+----------+--------------+ POP      Full           Yes      Yes                                 +---------+---------------+---------+-----------+----------+--------------+ PTV      Full                                                        +---------+---------------+---------+-----------+----------+--------------+ PERO     Full                                                        +---------+---------------+---------+-----------+----------+--------------+   +---------+---------------+---------+-----------+----------+--------------+ LEFT     CompressibilityPhasicitySpontaneityPropertiesThrombus Aging +---------+---------------+---------+-----------+----------+--------------+ CFV      Full           Yes      Yes                                 +---------+---------------+---------+-----------+----------+--------------+  SFJ      Full                                                        +---------+---------------+---------+-----------+----------+--------------+ FV Prox  Full                                                        +---------+---------------+---------+-----------+----------+--------------+ FV Mid   Full                                                         +---------+---------------+---------+-----------+----------+--------------+ FV DistalFull                                                        +---------+---------------+---------+-----------+----------+--------------+ PFV      Full                                                        +---------+---------------+---------+-----------+----------+--------------+ POP      Full           Yes      Yes                                 +---------+---------------+---------+-----------+----------+--------------+ PTV      Full                                                        +---------+---------------+---------+-----------+----------+--------------+ PERO     Full                                                        +---------+---------------+---------+-----------+----------+--------------+     Summary: BILATERAL: - No evidence of deep vein thrombosis seen in the lower extremities, bilaterally. - No evidence of superficial venous thrombosis in the lower extremities, bilaterally. -No evidence of popliteal cyst, bilaterally.   *See table(s) above for measurements and observations.    Preliminary    ECHOCARDIOGRAM COMPLETE  Result Date: 03/04/2022    ECHOCARDIOGRAM REPORT   Patient Name:   Stephanie Collier Date of Exam: 03/04/2022 Medical Rec #:  [id number]  Height:       60.0 in Accession #:    [id number] Weight:       97.0 lb Date of Birth:  11/07/1935  BSA:          1.372 m Patient Age:    87 years   BP:           157/75 mmHg Patient Gender: F          HR:           86 bpm. Exam Location:  Inpatient Procedure: 2D Echo, Cardiac Doppler and Color Doppler Indications:    I50.21 CHF  History:        Patient has prior history of Echocardiogram examinations, most                 recent 06/04/2021. CHF, COPD and History of thoracic aortic                 aneurysm history of stenting of aorta, Signs/Symptoms:Murmur;                 Risk Factors:Hypertension.  Sonographer:    Lenard Galloway [facility],  RD[facility] Referring Phys: [id number] [facility]  Sonographer Comments: Image difficulty due to kyphoscoliosis and patient unable to lay flat IMPRESSIONS  1. Independently mobile echodensity 0.7 x 0.7 cm, which appears to be in the right ventricular outflow tract based on parasternal imaging. Image 17-19 best demonstrate, not well seen elsewhere. Consider TEE if clinically relevant.  2. The aortic valve was not well visualized. There is moderate calcification of the aortic valve. Aortic valve regurgitation is severe. Aortic valve sclerosis/calcification is present, without any evidence of aortic stenosis. Stroke volume index 34 ml/m2 and DVI 0.43.  3. Left ventricular ejection fraction, by estimation, is 30 to 35%. The left ventricle has moderately decreased function. The left ventricle demonstrates regional wall motion abnormalities (see scoring diagram/findings for description). There is mild left ventricular hypertrophy. Left ventricular diastolic parameters are indeterminate.  4. Right ventricular systolic function is normal. The right ventricular size is normal. Tricuspid regurgitation signal is inadequate for assessing PA pressure.  5. The mitral valve is grossly normal. Mild mitral valve regurgitation.  6. The inferior vena cava is normal in size with greater than 50% respiratory variability, suggesting right atrial pressure of 3 mmHg. Comparison(s): A prior study was performed on 06/04/21. Prior images reviewed side by side. Aortic valve regurgitation appears similar. LV function has decreased. FINDINGS  Left Ventricle: Left ventricular ejection fraction, by estimation, is 30 to 35%. The left ventricle has moderately decreased function. The left ventricle demonstrates regional wall motion abnormalities. The left ventricular internal cavity size was normal in size. There is mild left ventricular hypertrophy. Left ventricular diastolic parameters are indeterminate. Right Ventricle: The right ventricular size is  normal. Right vetricular wall thickness was not well visualized. Right ventricular systolic function is normal. Tricuspid regurgitation signal is inadequate for assessing PA pressure. Left Atrium: Left atrial size was normal in size. Right Atrium: Right atrial size was normal in size. Pericardium: There is no evidence of pericardial effusion. Mitral Valve: The mitral valve is grossly normal. Mild mitral valve regurgitation. Tricuspid Valve: The tricuspid valve is not well visualized. Tricuspid valve regurgitation is trivial. Aortic Valve: The aortic valve was not well visualized. There is moderate calcification of the aortic valve. Aortic valve regurgitation is severe. Aortic regurgitation PHT measures 353 msec. Aortic valve sclerosis/calcification is present, without any evidence of aortic stenosis. Aortic valve mean gradient measures 6.0 mmHg. Aortic valve peak gradient measures 9.8 mmHg. Aortic valve area, by VTI measures 1.79 cm. Pulmonic Valve: The pulmonic valve was not well visualized. Pulmonic valve regurgitation  is trivial. Aorta: The aortic root is normal in size and structure and the ascending aorta was not well visualized. Venous: The inferior vena cava is normal in size with greater than 50% respiratory variability, suggesting right atrial pressure of 3 mmHg. IAS/Shunts: The interatrial septum was not well visualized.  LEFT VENTRICLE PLAX 2D LVIDd:         5.20 cm   Diastology LVIDs:         4.40 cm   LV e' medial:   2.96 cm/s LV PW:         1.20 cm   LV E/e' medial: 5.1 LV IVS:        1.00 cm LVOT diam:     2.30 cm LV SV:         46 LV SV Index:   34 LVOT Area:     4.15 cm  RIGHT VENTRICLE RV Basal diam:  2.40 cm RV S prime:     23.40 cm/s TAPSE (M-mode): 2.7 cm LEFT ATRIUM             Index        RIGHT ATRIUM           Index LA diam:        2.70 cm 1.97 cm/m   RA Area:     19.00 cm LA Vol (A2C):   36.6 ml 26.67 ml/m  RA Volume:   48.60 ml  35.41 ml/m LA Vol (A4C):   16.1 ml 11.73 ml/m LA  Biplane Vol: 24.6 ml 17.92 ml/m  AORTIC VALVE AV Area (Vmax):    1.99 cm AV Area (Vmean):   1.85 cm AV Area (VTI):     1.79 cm AV Vmax:           156.41 cm/s AV Vmean:          116.818 cm/s AV VTI:            0.259 m AV Peak Grad:      9.8 mmHg AV Mean Grad:      6.0 mmHg LVOT Vmax:         74.73 cm/s LVOT Vmean:        52.133 cm/s LVOT VTI:          0.111 m LVOT/AV VTI ratio: 0.43 AI PHT:            353 msec  AORTA Ao Root diam: 3.50 cm Ao Asc diam:  3.90 cm MV E velocity: 14.98 cm/s                            SHUNTS                            Systemic VTI:  0.11 m                            Systemic Diam: 2.30 cm Cherlynn Kaiser MD Electronically signed by Cherlynn Kaiser MD Signature Date/Time: 03/04/2022/2:20:45 PM    Final    CT ABDOMEN PELVIS WO CONTRAST  Result Date: 03/03/2022 CLINICAL DATA:  Abdominal mass with decreased appetite. EXAM: CT ABDOMEN AND PELVIS WITHOUT CONTRAST TECHNIQUE: Multidetector CT imaging of the abdomen and pelvis was performed following the standard protocol without IV contrast. RADIATION DOSE REDUCTION: This exam was performed according to the departmental dose-optimization program which includes automated exposure control,  adjustment of the mA and/or kV according to patient size and/or use of iterative reconstruction technique. COMPARISON:  CTA chest today, CTA chest, abdomen and pelvis 02/07/2022 FINDINGS: Lower chest: There is moderate pan chamber cardiomegaly, through three-vessel coronary artery calcifications and a small chronic pericardial effusion. Lung bases show scarring changes and posterior atelectasis without infiltrates. Hepatobiliary: Limited visualization due to artifact from overlying wires and the patient's arms in the field. No obvious focal liver abnormality. Gallbladder is contracted and poorly seen. No biliary dilatation. Pancreas: Limited visualization due to the patient's arms and overlying wires causing streak artifact. No obvious abnormality. 7 mm  lipoma was previously seen posteriorly in the pancreatic neck. Spleen: No obvious abnormality. Adrenals/Urinary Tract: No adrenal or renal masses seen. There is mild bilateral renal volume loss. There is contrast in the collecting systems. No hydronephrosis or ureterectasis is seen. There is contrast opacification of the unremarkable bladder. The wall and lumen are unremarkable. Stomach/Bowel: No dilatation or wall thickening is visible without contrast. An appendix is not seen. Uncomplicated sigmoid diverticulosis. Vascular/Lymphatic: No adenopathy is seen. There is extensive aortoiliac calcification. There is calcification in the visceral branch arteries. 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 Reproductive: Uterus and bilateral adnexa are unremarkable. Other: There is no free air, free hemorrhage or free fluid. Musculoskeletal: Osteopenia. Moderate L3 and 4 compression fractures have been treated with kyphoplasty in the interval. There is progressive compression fracture of the T8 vertebral body with about 30% overall height loss. Old left hip nailing. Degenerative change and dextro rotary scoliosis lumbar spine. IMPRESSION: 1. No acute noncontrast CT abnormality is seen in the abdomen or pelvis. 2. Cardiomegaly with small chronic pericardial effusion. 3. Aortic and coronary artery atherosclerosis. 4. Extensive aortoiliac calcifications and stable 4.4 x 4.5 cm fusiform distal infrarenal AAA. Possible that the aneurysm could be palpated as a mass because it is in between the abdominal wall and spine. Recommend follow-up every 6 months and vascular consultation. Reference: J Am Coll Radiol [id number]. 5. Stable aneurysmal dilatation of the common  iliac arteries and bilateral internal iliac arteries. 6. Osteopenia, scoliosis and degenerative change. 7. Progressive T8 compression fracture with about 30% height loss. 8. L3 and L4 moderate compression fractures have been treated with kyphoplasty in the interval. No increased height loss at either level. Aortic Atherosclerosis (ICD10-I70.0). Electronically Signed   By: Telford Nab M.D.   On: 03/03/2022 21:56   CT Angio Chest PE W and/or Wo Contrast  Result Date: 03/03/2022 CLINICAL DATA:  Pulmonary embolism suspected. High probability. Recent lumbar surgery. EXAM: CT ANGIOGRAPHY CHEST WITH CONTRAST TECHNIQUE: Multidetector CT imaging of the chest was performed using the standard protocol during bolus administration of intravenous contrast. Multiplanar CT image reconstructions and MIPs were obtained to evaluate the vascular anatomy. RADIATION DOSE REDUCTION: This exam was performed according to the departmental dose-optimization program which includes automated exposure control, adjustment of the mA and/or kV according to patient size and/or use of iterative reconstruction technique. CONTRAST:  46m OMNIPAQUE IOHEXOL 350 MG/ML SOLN COMPARISON:  02/07/2022 FINDINGS: Cardiovascular: Chronic cardiomegaly. Coronary artery calcification. Aortic atherosclerotic calcification. Thoracic aortic stent without complicating feature. This is not opacified by contrast as this exam is targeted towards the pulmonary arteries. Pulmonary  arterial opacification is good. No pulmonary emboli. Mediastinum/Nodes: No mass or adenopathy. Lungs/Pleura: Chronic upper lobe pulmonary scarring. Minimal dependent atelectasis. No pleural effusion. No sign of pneumonia or mass lesion. Upper Abdomen: Negative Musculoskeletal: Thoracic compression fractures at T5, T6 and T7 appear the same as they did on the prior exam. There has been progression of compression fractures at T8 and T10. Review of the MIP images confirms the above findings.  IMPRESSION: 1. No pulmonary emboli. 2. Chronic cardiomegaly. Coronary artery calcification. Aortic stent without visible complicating feature. 3. Chronic upper lobe pulmonary scarring. 4. Thoracic compression fractures at T5, T6 and T7 appear the same as they did on the prior exam. There has been progression of compression fractures at T8 and T10. Aortic Atherosclerosis (ICD10-I70.0). Electronically Signed   By: Nelson Chimes M.D.   On: 03/03/2022 18:21   DG Chest 2 View  Result Date: 03/03/2022 CLINICAL DATA:  Shortness of breath. EXAM: CHEST - 2 VIEW COMPARISON:  Chest radiograph 06/03/2021 and CTA 02/07/2022 FINDINGS: The cardiac silhouette remains enlarged. A descending thoracic aortic stent graft is again noted. There is chronic interstitial coarsening. Increased, mild densities in the lung bases compared to the prior radiograph may reflect atelectasis and possibly trace pleural effusions. No pneumothorax is identified. Thoracolumbar scoliosis and multiple compression fractures are again noted. IMPRESSION: Increased bibasilar opacities which may reflect atelectasis and possible trace pleural effusions. Electronically Signed   By: Logan Bores M.D.   On: 03/03/2022 13:16     Assessment and Plan:   New cardiomyopathy w/ wall motion abnormalities Acute HFrEF  Patient with newly reduced LVEF (30-35% down from 60-65% May 2023) and regional wall motion abnormalities. Increased orthopnea/dyspnea in the last few weeks, NYHA class II-III symptoms. CTA chest noted with coronary artery calcifications. Aortic stent without visible complications. CT abdomen pelvis with stable 4.4x4.5 cm fusiform distal infrarenal AAA. BNP elevated at 1707.4. Troponin elevated but flat at 54->63->65->64->59. ECG with notable LVH, borderline ST elevation in V2-V4.  Patient with bibasilar crackles and elevated JVP on physical exam. Net output of 54m this admission. Symptoms/flat troponin not fully consistent with ACS. However,  given risk factors for CAD and calcifications on CTA, patient warrants further ischemic workup of new cardiomyopathy.   Given ongoing dyspnea with evidence of pulmonary edema, would continue to cautiously diurese this weekend [facility]  BP low normal on most recent check. Otherwise, has been hypertensive. Will add low dose Metoprolol and defer additional GDMT such as MRA, ACEI/ARB/ARNI, SGLT2 until BP response to Metoprolol is assessed.    Risk Assessment/Risk Scores:      New York Heart Association (NYHA) Functional Class NYHA Class III   For questions or updates, please contact [place] [email or web address] for contact info under    Signed, ELily Kocher PA-C  03/05/2022 11:54 AM  History and all data above reviewed.  Patient examined.  I agree with the findings as above.  Presents predominantly for evaluation of back pain and mobility issues.  She has had lumbar compression fractures and hip injury.  She has been having some of her failure to thrive and weight loss over months.  She lives with a daughter who is visually impaired and getting ready to have eye surgery.  She does report some shortness of breath but in part she thinks it has to do with her frailty and weakness and decreased mobility.  However, when she just has to clean herself up in the bathroom  she gets short of breath.  She is not describing PND or orthopnea.  She is not describing chest pressure, neck or arm discomfort.  Because the shortness of breath troponins were ordered and she was found to have mildly elevated flat troponin trend.  However, echo demonstrates findings as above.  This is new compared to the previous echocardiogram.  Despite her vascular problems including stenting of her aorta she has not actually seen a cardiologist or otherwise had a cardiac issue.    The patient exam reveals COR: Regular rate and rhythm, soft brief apical systolic murmur,  Lungs:  Decreased breath sounds bilaterally with bilateral basilar crackles,  Abd: Positive bowel sounds normal frequency pitch, bruits, rebound, guarding, Ext 2+ pulses, no edema..  All available labs, radiology testing, previous records reviewed. Agree with documented assessment and plan.  Acute systolic heart failure: This is a new diagnosis.  She would agree to a right and left heart catheterization I think this is reasonable.  Will start a low-dose beta-blocker.  I agree with trying to continue her diuresis although this should be assessed daily over the weekend.  Stephanie Collier  2:42 PM  03/05/2022

## 2022-03-05 NOTE — Progress Notes (Signed)
Bilateral lower extremity venous study completed.   Preliminary results relayed to RN.  Please see CV Procedures for preliminary results.  Kristyna Bradstreet, RVT  9:55 AM 03/05/22

## 2022-03-05 NOTE — Progress Notes (Signed)
Daily Progress Note   Patient Name: Stephanie Collier       Date: 03/05/2022 DOB: [birth year]  Age: 87 y.o. MRN#: [id number] Attending Physician: Barb Merino, MD Primary Care Physician: [facility] Admit Date: 03/03/2022  Reason for Consultation/Follow-up: Establishing goals of care  Subjective: Tells me she needs to sleep, doesn't want to talk  Length of Stay: 1  Current Medications: Scheduled Meds:   enoxaparin (LOVENOX) injection  30 mg Subcutaneous Q24H   furosemide  40 mg Intravenous Daily   levothyroxine  50 mcg Oral Q0600   lidocaine  1 patch Transdermal Q24H   nystatin  5 mL Oral QID   potassium chloride  40 mEq Oral BID    Continuous Infusions:   PRN Meds: ALPRAZolam, hydrALAZINE, ipratropium-albuterol, naLOXone (NARCAN)  injection, oxyCODONE  Physical Exam Constitutional:      General: She is not in acute distress.    Appearance: She is ill-appearing.  Pulmonary:     Effort: Pulmonary effort is normal.  Skin:    General: Skin is warm and dry.  Neurological:     Mental Status: She is oriented to person, place, and time.             Vital Signs: BP 115/64 (BP Location: Left Arm)   Pulse 93   Temp 97.8 F (36.6 C) (Oral)   Resp (!) 31   Wt 45.1 kg   SpO2 92%   BMI 19.42 kg/m  SpO2: SpO2: 92 % O2 Device: O2 Device: Room Air O2 Flow Rate:    Intake/output summary:  Intake/Output Summary (Last 24 hours) at 03/05/2022 1350 Last data filed at 03/05/2022 1254 Gross per 24 hour  Intake 480 ml  Output 525 ml  Net -45 ml   LBM: Last BM Date : 03/02/22 Baseline Weight: Weight: 45.5 kg Most recent weight: Weight: 45.1 kg       Palliative Assessment/Data: PPS 40%      Patient Active Problem List   Diagnosis Date Noted   CHF exacerbation ([place])  03/04/2022   CHF, acute ([place]) 03/03/2022   Anorexia 03/03/2022   Elevated troponin 03/03/2022   Oral thrush 03/03/2022   Abnormal thyroid function test 03/03/2022   COPD (chronic obstructive pulmonary disease) ([place]) 03/03/2022   Intractable back pain 02/07/2022   Compression fracture of body of thoracic vertebra (HCC) 02/07/2022   Lumbar compression fracture, closed, initial encounter ([place]) 02/07/2022   PVD (peripheral vascular disease) ([place]) 02/07/2022   Dysphagia 02/07/2022   Multiple lung nodules 02/07/2022   DNR (do not resuscitate) 02/07/2022   Protein-calorie malnutrition, severe 06/06/2021   Cardiac murmur 06/04/2021   Fall at home, initial encounter 06/04/2021   Normocytic anemia 06/04/2021   Closed left hip fracture ([place]) 06/03/2021   Prolonged QT interval 06/03/2021   Family history of breast cancer 11/13/2018   Malnutrition of moderate degree ([place]) 04/09/2014   Aortic aneurysm, thoracic ([place]) 04/04/2014   Acute bronchitis 03/31/2014   Essential hypertension 03/31/2014   Descending thoracic aortic dissection (HCC)    UTI (lower urinary tract infection)     Palliative Care Assessment & Plan   HPI: 87 y.o. female  with past medical history of essential hypertension, AAA,  COPD, Raynaud's disease, scoliosis, and recent admission for intractable back pain due to multiple spinal fractures status post kyphoplasty admitted on 03/03/2022 with generalized weakness, loss of appetite, and shortness of breath.  Patient diagnosed with acute CHF.  Chest x-ray with cardiomegaly.  Also with elevated BNP.  She is being treated with IV Lasix.  Patient also found to have stable AAA and more progression of compression fractures of T8 and T10.  She was found with elevated TSH at 7.  PMT consulted to discuss goals of care.   Assessment: Chart reviewed. Ms Bernardo is tired today, tells me she just wants to sleep. Received 0.25 mg xanax about 1.5 hrs prior to my visit. She remains oriented to time and  situation.  Ms. Hook echocardiogram reveals EF 30-35%. Now being seen by cardiology note pending but Ms. Fronheiser expresses that cardiac cath was discussed with her. She is aware of echocardiogram findings. I ask her how she feels about cardiac interventions and she tells me she needs to think about it more. I ask her if we can review our conversation yesterday but she declines telling me she really just wants to sleep. I ask her if I can call her daughter to address any questions/concerns but Ms. Maskell requests that I not call her daughter today - tells me today is a busy day for her. I ask her if palliative team can follow up with her over the weekend and she is agreeable.     Recommendations/Plan: Forde Dandy much more limited today than yesterday - wants to sleep, remains oriented to situation and understands her diagnoses She requests I not call her daughter today, says it is a busy day for her Agreeable to palliative follow up over the weekend  Code Status: DNR  Care plan was discussed with patient  Thank you for allowing the Palliative Medicine Team to assist in the care of this patient.  *Please note that this is a verbal dictation therefore any spelling or grammatical errors are due to the "[place]" system interpretation.  Juel Burrow, DNP, [facility] Palliative Medicine Team Team Phone # [phone]  Pager [phone]

## 2022-03-05 NOTE — Progress Notes (Signed)
PT scored a "Yellow MEWS" Vitals were taken after PT repositioned in the bed to obtain V/S. MEWS was driven by an increase in RR giving 2 points. When settled, re scored MEWS was a green. No escalation needed.     03/05/22 1307  Assess: MEWS Score  Temp 97.8 F (36.6 C)  BP 115/64  MAP (mmHg) 79  Pulse Rate 93  ECG Heart Rate 91  Resp (!) 31  SpO2 92 %  O2 Device Room Air  Assess: MEWS Score  MEWS Temp 0  MEWS Systolic 0  MEWS Pulse 0  MEWS RR 2  MEWS LOC 0  MEWS Score 2  MEWS Score Color Yellow  Assess: if the MEWS score is Yellow or Red  Were vital signs taken at a resting state? No (Just repositioned in bed to obtain V/S.)  Focused Assessment No change from prior assessment  Does the patient meet 2 or more of the SIRS criteria? Yes  Does the patient have a confirmed or suspected source of infection? No  MEWS guidelines implemented  No, vital signs rechecked  Assess: SIRS CRITERIA  SIRS Temperature  0  SIRS Pulse 1  SIRS Respirations  1  SIRS WBC 0  SIRS Score Sum  2

## 2022-03-05 NOTE — TOC Progression Note (Signed)
Transition of Care [facility]) - Progression Note    Patient Details  Name: Alene Solesbee MRN: [id number] Date of Birth: 1935-09-09  Transition of Care [facility]) CM/SW [place], LCSW Phone Number: 03/05/2022, 11:24 AM  Clinical Narrative:    CSW met with pt to discuss recommendation for SNF. Pt is refusing SNF and stated she wants to have PT come to her house. Pt reported that she has had home health PT previously. Due to recent experience with SNF, pt doesn't want to pursue SNF. Pt stated she recently checked herself out of [facility] SNF. CSW informed pt that RN CM will come speak with pt to set up home health PT.   TOC will continue to follow this admission.         Expected Discharge Plan and Services                                               Social Determinants of Health (SDOH) Interventions SDOH Screenings   Food Insecurity: No Food Insecurity (02/08/2022)  Housing: Low Risk  (02/08/2022)  Transportation Needs: No Transportation Needs (02/08/2022)  Utilities: Not At Risk (02/08/2022)  Tobacco Use: Low Risk  (03/03/2022)    Readmission Risk Interventions     No data to display         Beckey Rutter, MSW, LCSWA, LCASA Transitions of Care  Clinical Social Worker I

## 2022-03-05 NOTE — Progress Notes (Signed)
PT Cancellation Note  Patient Details Name: Stephanie Collier MRN: [id number] DOB: 12/05/1935   Cancelled Treatment:    Reason Eval/Treat Not Completed: (P) Patient declined, no reason specified;Other (comment), pt declining all mobility stating she has "not slept in 2 weeks" also stating she is to have surgery Monday and needs to be as strong as possible, educated pt re; importance of frequent and continued mobility to maintain strength with pt continuing to decline.   Audry Riles. PTA Acute Rehabilitation Services Office: [place] 03/05/2022, 4:13 PM

## 2022-03-05 NOTE — Evaluation (Signed)
Occupational Therapy Evaluation Patient Details Name: Stephanie Collier MRN: [id number] DOB: [birth year] Today's Date: 03/05/2022   History of Present Illness 87 y.o. female admitted on 03/03/2022 with generalized weakness, loss of appetite, and shortness of breath.  Patient diagnosed with acute CHF.Scan reveal progression of compression fractures of T8 and T10.  PMH essential HTN, AAA, COPD, Raynaud's disease, scoliosis,02/10/22 kyphoplasty   Clinical Impression   PT admitted with acute CHF and back pain. Pt currently with functional limitiations due to the deficits listed below (see OT problem list). Pt recent admission 02/10/22 and progressive generalized weakness. Pt reports poor rest 03/04/22 PM and anxiety. Pt states she has never had anxiety but discussion of DNR caused her to keep thinking.  Pt will benefit from skilled OT to increase their independence and safety with adls and balance to allow discharge SNF.       Recommendations for follow up therapy are one component of a multi-disciplinary discharge planning process, led by the attending physician.  Recommendations may be updated based on patient status, additional functional criteria and insurance authorization.   Follow Up Recommendations  Skilled nursing-short term rehab (<3 hours/day)     Assistance Recommended at Discharge Intermittent Supervision/Assistance  Patient can return home with the following Assist for transportation;Assistance with cooking/housework;A little help with walking and/or transfers;A little help with bathing/dressing/bathroom    Functional Status Assessment  Patient has had a recent decline in their functional status and demonstrates the ability to make significant improvements in function in a reasonable and predictable amount of time.  Equipment Recommendations  Wheelchair (measurements OT);Wheelchair cushion (measurements OT)    Recommendations for Other Services       Precautions / Restrictions  Precautions Precautions: Fall;Back Precaution Booklet Issued: No Precaution Comments: back precautions      Mobility Bed Mobility Overal bed mobility: Needs Assistance Bed Mobility: Supine to Sit, Sit to Supine Rolling: Min assist   Supine to sit: Min assist Sit to supine: Min assist   General bed mobility comments: pt needs rest break at EOB. pt reports L side pain and requesting L side exit    Transfers Overall transfer level: Needs assistance Equipment used: Rolling walker (2 wheels) Transfers: Sit to/from Stand Sit to Stand: Min guard, From elevated surface           General transfer comment: pt with bed elevated to push up from bed surface. Pt needs physical from chair position with bil UE on arm rest      Balance Overall balance assessment: Needs assistance         Standing balance support: Bilateral upper extremity supported, Reliant on assistive device for balance, During functional activity Standing balance-Leahy Scale: Poor                             ADL either performed or assessed with clinical judgement   ADL Overall ADL's : Needs assistance/impaired Eating/Feeding: Set up;Sitting Eating/Feeding Details (indicate cue type and reason): drinking orange juice with straw Grooming: Wash/dry face;Set up;Sitting   Upper Body Bathing: Moderate assistance   Lower Body Bathing: Maximal assistance;Sit to/from stand   Upper Body Dressing : Maximal assistance;Sitting   Lower Body Dressing: Maximal assistance;Sit to/from stand   Toilet Transfer: Maximal assistance;Ambulation;BSC/3in1;Rolling walker (2 wheels)   Toileting- Clothing Manipulation and Hygiene: Maximal assistance;Sit to/from stand       Functional mobility during ADLs: Minimal assistance;Rolling walker (2 wheels) General ADL Comments: pt noted to have SOB  and unable to answer question if RR 40 is due to pain or inability to breath. pt noted to drop to 84% on RA and rebound in  sitting 96%     Vision Patient Visual Report: No change from baseline       Perception     Praxis      Pertinent Vitals/Pain Pain Assessment Pain Assessment: 0-10 Pain Score: 3  Pain Location: middle of back Pain Descriptors / Indicators: Sore, Constant Pain Intervention(s): Monitored during session, Repositioned, Limited activity within patient's tolerance     Hand Dominance Right   Extremity/Trunk Assessment Upper Extremity Assessment Upper Extremity Assessment: Generalized weakness   Lower Extremity Assessment Lower Extremity Assessment: Generalized weakness   Cervical / Trunk Assessment Cervical / Trunk Assessment: Kyphotic   Communication Communication Communication: No difficulties   Cognition Arousal/Alertness: Awake/alert Behavior During Therapy: Flat affect Overall Cognitive Status: Within Functional Limits for tasks assessed                                       General Comments  RA  with desaturation noted. Pt fatigues quickly. pt reports L side pain and Rn made aware    Exercises     Shoulder Instructions      Home Living Family/patient expects to be discharged to:: Skilled nursing facility Living Arrangements: Children Available Help at Discharge: Family;Available 24 hours/day Type of Home: House Home Access: Stairs to enter [employer] of Steps: 3 Entrance Stairs-Rails: Left Home Layout: One level     Bathroom Shower/Tub: Tub/shower unit;Walk-in shower   Bathroom Toilet: Standard Bathroom Accessibility: Yes How Accessible: Accessible via walker Home Equipment: [place] (2 wheels);Rollator (4 wheels);Cane - quad;Shower seat;Hand held shower head;Grab bars - tub/shower   Additional Comments: cats- daughters animals      Prior Functioning/Environment Prior Level of Function : Independent/Modified Independent;History of Falls (last six months)             Mobility Comments: ambulatory with Rollator  outdoors, RW vs no device indoors, utilizes quad cane for stair negotiation ADLs Comments: daughter helping get in and out of shower for last two weeks  prior was mod I with ADL and light home management at [facility] level        OT Problem List: Pain;Decreased activity tolerance;Impaired balance (sitting and/or standing)      OT Treatment/Interventions: Self-care/ADL training;Therapeutic activities;Patient/family education;Balance training;DME and/or AE instruction    OT Goals(Current goals can be found in the care plan section) Acute Rehab OT Goals Patient Stated Goal: to be able to move without this pain OT Goal Formulation: With patient Time For Goal Achievement: 03/19/22 Potential to Achieve Goals: Good  OT Frequency: Min 2X/week    Co-evaluation              AM-PAC OT "6 Clicks" Daily Activity     Outcome Measure Help from another person eating meals?: None Help from another person taking care of personal grooming?: A Little Help from another person toileting, which includes using toliet, bedpan, or urinal?: A Little Help from another person bathing (including washing, rinsing, drying)?: A Lot Help from another person to put on and taking off regular upper body clothing?: A Little Help from another person to put on and taking off regular lower body clothing?: A Lot 6 Click Score: 17   End of Session Equipment Utilized During Treatment: Rolling walker (2 wheels) Nurse Communication:  Mobility status;Precautions  Activity Tolerance: Patient limited by pain Patient left: in bed;with call bell/phone within reach;with bed alarm set  OT Visit Diagnosis: Unsteadiness on feet (R26.81)                Time: [id number] OT Time Calculation (min): 27 min Charges:  OT General Charges $OT Visit: 1 Visit OT Evaluation $OT Eval Moderate Complexity: 1 Mod   Brynn, OTR/L  Acute Rehabilitation Services Office: [phone] .   Jeri Modena 03/05/2022, 12:23 PM

## 2022-03-05 NOTE — Progress Notes (Signed)
PROGRESS NOTE    Stephanie Collier  [id number] DOB: Feb 16, 1935 DOA: 03/03/2022 PCP: [facility]    Brief Narrative:  87 year old female with history of essential hypertension, AAA, COPD, Raynaud's disease, scoliosis, recent admission for intractable back pain due to multiple spinal fractures status post kyphoplasty, comes back to the hospital with generalized weakness, loss of appetite, shortness of breath.  In the emergency room, slightly tachypneic but not hypoxemic.  100% on room air.  Afebrile.  BNP 1707.  COVID influenza RSV PCR negative.  Troponins 54.  TSH 7.  Admitted with possible new onset congestive heart failure. Echocardiogram with significantly worsened ejection fraction.   Assessment & Plan:   Acute systolic congestive heart failure.  New onset. Demand ischemia.  Presented with shortness of breath, orthopnea and PND along with peripheral edema.  Chest x-ray with cardiomegaly.  BNP is greatly elevated. 2D echocardiogram shows ejection fraction 30 to 35%, previous echocardiogram with diastolic heart failure and normal ejection fraction. 0.7 x 0.7 cm mobile mass right ventricular outflow tract?  Patient does not have clinical evidence of endocarditis.  Will discuss with cardiology.  Blood cultures are drawn. Started on IV Lasix, will continue.  Blood pressure is borderline currently not able to tolerate additional GDMT.  Daily weight Intake output monitoring Mildly elevated troponins, less likely acute coronary syndrome.  More likely demand ischemia. Replace potassium. Daily BMP. With complaints of leg pain, duplex was ordered.  Results are pending.  Palpable abdominal mass: CT scan consistent with abdominal aortic aneurysm. Patient does have extensive aortoiliac calcification with stable 4.4 x 4.5 cm fusiform distal infrarenal AAA.  She has been followed as outpatient by vascular surgery for this.  This has remained stable.  Recent multiple vertebral  compression fractures: More progression of compression fractures of T8 and T10.  She is not a surgical candidate.  She had a kyphoplasty last month.  Conservative management.  COPD: DuoNebs as needed.  Not in exacerbation.  Elevated TSH: TSH 7.  T4 is 1.3.  Mildly elevated however patient with lethargy and weakness.  Will start on Synthroid 50 mcg.  Will need recheck in about a month.  Dysphagia and frailty: Seen by speech therapy.  Tolerating regular diet and thin liquids.  Swallowing is normal.  Progressive debility, frailty and failure to thrive: Patient continues to have poor appetite, dysphagia and progressive debility. Work with PT OT and speech. Cardiology consultation. Seen by palliative care, patient currently hopes to get better and work with PT OT at home.  She does live with her daughter at home. Symptom management, encouraged to use some oxycodone for back pain with some laxatives. She has profound anxiety, will try low-dose of Xanax today if that helps she will need prescription.    DVT prophylaxis: SCDs Start: 03/03/22 2053   Code Status: DNR Family Communication: Daughter on the phone. Disposition Plan: Status is: Inpatient.  Active treatment of congestive heart failure, IV diuresis   Consultants:  Palliative care team Cardiology  Procedures:  None  Antimicrobials:  None   Subjective:  Patient seen and examined.  Did not remember having conversation with me yesterday morning.  She had profound anxiety and could not sleep much last night.  Denies any chest pain or shortness of breath.  Leg swelling is improved. Denies any back pain when laying in the bed. Wants to try something for anxiety. Wants to talk to cardiology.  Objective: Vitals:   03/05/22 [id number] 03/05/22 0500 03/05/22 [id number] 03/05/22 [id number]  BP: (!) 142/76  138/72 (!) 141/74  Pulse: 90  84 93  Resp: '17  15 20  '$ Temp: 97.7 F (36.5 C)  (!) 97 F (36.1 C) 97.7 F (36.5 C)  TempSrc: Oral  Oral  Oral  SpO2: 94%  94% 94%  Weight: 45.5 kg 45.1 kg      Intake/Output Summary (Last 24 hours) at 03/05/2022 1019 Last data filed at 03/05/2022 0700 Gross per 24 hour  Intake 420 ml  Output 525 ml  Net -105 ml    Filed Weights   03/05/22 0044 03/05/22 0500  Weight: 45.5 kg 45.1 kg    Examination:  General: Frail and debilitated.  on room air.  Looks comfortable today but with anxiety. Cardiovascular: S1-S2 normal.  Tachycardic.  Patient does have parasternal heave. JVD visible and pulsatile. Respiratory: No added sounds. Gastrointestinal: Soft.  Nontender.  Thin scaphoid abdomen with palpable pulsating aortic aneurysm. Ext: No edema.  No cyanosis. Neuro: Alert awake and oriented x 4.  generalized weakness. Musculoskeletal: No deformities.     Data Reviewed: I have personally reviewed following labs and imaging studies  CBC: Recent Labs  Lab 03/03/22 1243 03/05/22 0132  WBC 5.2 6.1  NEUTROABS  --  3.9  HGB 13.5 13.8  HCT 43.0 41.5  MCV 97.1 93.5  PLT 159 0000000    Basic Metabolic Panel: Recent Labs  Lab 03/03/22 1243 03/04/22 0219 03/05/22 0132  NA 136 142 139  K 3.5 3.3* 4.2  CL 101 97* 99  CO2 '25 28 28  '$ GLUCOSE 123* 98 122*  BUN '17 12 14  '$ CREATININE 0.70 0.72 0.69  CALCIUM 9.2 9.0 9.1  MG  --   --  1.8  PHOS  --   --  3.7    GFR: Estimated Creatinine Clearance: 37.3 mL/min (by C-G formula based on SCr of 0.69 mg/dL). Liver Function Tests: Recent Labs  Lab 03/04/22 0219  AST 23  ALT 14  ALKPHOS 78  BILITOT 2.3*  PROT 6.0*  ALBUMIN 3.6    No results for input(s): "LIPASE", "AMYLASE" in the last 168 hours. No results for input(s): "AMMONIA" in the last 168 hours. Coagulation Profile: No results for input(s): "INR", "PROTIME" in the last 168 hours. Cardiac Enzymes: No results for input(s): "CKTOTAL", "CKMB", "CKMBINDEX", "TROPONINI" in the last 168 hours. BNP (last 3 results) No results for input(s): "PROBNP" in the last 8760  hours. HbA1C: No results for input(s): "HGBA1C" in the last 72 hours. CBG: No results for input(s): "GLUCAP" in the last 168 hours. Lipid Profile: No results for input(s): "CHOL", "HDL", "LDLCALC", "TRIG", "CHOLHDL", "LDLDIRECT" in the last 72 hours. Thyroid Function Tests: Recent Labs    03/03/22 1224 03/03/22 1652  TSH  --  7.095*  FREET4 1.34*  --     Anemia Panel: No results for input(s): "VITAMINB12", "FOLATE", "FERRITIN", "TIBC", "IRON", "RETICCTPCT" in the last 72 hours. Sepsis Labs: No results for input(s): "PROCALCITON", "LATICACIDVEN" in the last 168 hours.  Recent Results (from the past 240 hour(s))  Resp panel by RT-PCR (RSV, Flu A&B, Covid) Anterior Nasal Swab     Status: None   Collection Time: 03/03/22  3:56 PM   Specimen: Anterior Nasal Swab  Result Value Ref Range Status   SARS Coronavirus 2 by RT PCR NEGATIVE NEGATIVE Final   Influenza A by PCR NEGATIVE NEGATIVE Final   Influenza B by PCR NEGATIVE NEGATIVE Final    Comment: (NOTE) The Xpert Xpress SARS-CoV-2/FLU/RSV plus assay is intended as an  aid in the diagnosis of influenza from Nasopharyngeal swab specimens and should not be used as a sole basis for treatment. Nasal washings and aspirates are unacceptable for Xpert Xpress SARS-CoV-2/FLU/RSV testing.  Fact Sheet for Patients: EntrepreneurPulse.com.au  Fact Sheet for Healthcare Providers: IncredibleEmployment.be  This test is not yet approved or cleared by the [place] FDA and has been authorized for detection and/or diagnosis of SARS-CoV-2 by FDA under an Emergency Use Authorization (EUA). This EUA will remain in effect (meaning this test can be used) for the duration of the COVID-19 declaration under Section 564(b)(1) of the Act, 21 U.S.C. section 360bbb-3(b)(1), unless the authorization is terminated or revoked.     Resp Syncytial Virus by PCR NEGATIVE NEGATIVE Final    Comment: (NOTE) Fact Sheet for  Patients: EntrepreneurPulse.com.au  Fact Sheet for Healthcare Providers: IncredibleEmployment.be  This test is not yet approved or cleared by the [place] FDA and has been authorized for detection and/or diagnosis of SARS-CoV-2 by FDA under an Emergency Use Authorization (EUA). This EUA will remain in effect (meaning this test can be used) for the duration of the COVID-19 declaration under Section 564(b)(1) of the Act, 21 U.S.C. section 360bbb-3(b)(1), unless the authorization is terminated or revoked.  Performed at [facility], [place] [address] [place], [place] [address]   Culture, blood (single) w Reflex to ID Panel     Status: None (Preliminary result)   Collection Time: 03/04/22  4:05 PM   Specimen: BLOOD  Result Value Ref Range Status   Specimen Description BLOOD SITE NOT SPECIFIED  Final   Special Requests   Final    BOTTLES DRAWN AEROBIC ONLY Blood Culture results may not be optimal due to an inadequate volume of blood received in culture bottles   Culture   Final    NO GROWTH < 24 HOURS Performed at [facility], [address] [place], [place] [address]    Report Status PENDING  Incomplete         Radiology Studies: ECHOCARDIOGRAM COMPLETE  Result Date: 03/04/2022    ECHOCARDIOGRAM REPORT   Patient Name:   Stephanie Collier Date of Exam: 03/04/2022 Medical Rec #:  [id number]  Height:       60.0 in Accession #:    [id number] Weight:       97.0 lb Date of Birth:  October 05, 1935  BSA:          1.372 m Patient Age:    87 years   BP:           157/75 mmHg Patient Gender: F          HR:           86 bpm. Exam Location:  Inpatient Procedure: 2D Echo, Cardiac Doppler and Color Doppler Indications:    I50.21 CHF  History:        Patient has prior history of Echocardiogram examinations, most                 recent 06/04/2021. CHF, COPD and History of thoracic aortic                 aneurysm history of stenting of aorta,  Signs/Symptoms:Murmur;                 Risk Factors:Hypertension.  Sonographer:    Lenard Galloway [facility], RDCS Referring Phys: [id number] [facility]  Sonographer Comments: Image difficulty due to kyphoscoliosis and patient unable to lay flat IMPRESSIONS  1. Independently  mobile echodensity 0.7 x 0.7 cm, which appears to be in the right ventricular outflow tract based on parasternal imaging. Image 17-19 best demonstrate, not well seen elsewhere. Consider TEE if clinically relevant.  2. The aortic valve was not well visualized. There is moderate calcification of the aortic valve. Aortic valve regurgitation is severe. Aortic valve sclerosis/calcification is present, without any evidence of aortic stenosis. Stroke volume index 34 ml/m2 and DVI 0.43.  3. Left ventricular ejection fraction, by estimation, is 30 to 35%. The left ventricle has moderately decreased function. The left ventricle demonstrates regional wall motion abnormalities (see scoring diagram/findings for description). There is mild left ventricular hypertrophy. Left ventricular diastolic parameters are indeterminate.  4. Right ventricular systolic function is normal. The right ventricular size is normal. Tricuspid regurgitation signal is inadequate for assessing PA pressure.  5. The mitral valve is grossly normal. Mild mitral valve regurgitation.  6. The inferior vena cava is normal in size with greater than 50% respiratory variability, suggesting right atrial pressure of 3 mmHg. Comparison(s): A prior study was performed on 06/04/21. Prior images reviewed side by side. Aortic valve regurgitation appears similar. LV function has decreased. FINDINGS  Left Ventricle: Left ventricular ejection fraction, by estimation, is 30 to 35%. The left ventricle has moderately decreased function. The left ventricle demonstrates regional wall motion abnormalities. The left ventricular internal cavity size was normal in size. There is mild left ventricular hypertrophy.  Left ventricular diastolic parameters are indeterminate. Right Ventricle: The right ventricular size is normal. Right vetricular wall thickness was not well visualized. Right ventricular systolic function is normal. Tricuspid regurgitation signal is inadequate for assessing PA pressure. Left Atrium: Left atrial size was normal in size. Right Atrium: Right atrial size was normal in size. Pericardium: There is no evidence of pericardial effusion. Mitral Valve: The mitral valve is grossly normal. Mild mitral valve regurgitation. Tricuspid Valve: The tricuspid valve is not well visualized. Tricuspid valve regurgitation is trivial. Aortic Valve: The aortic valve was not well visualized. There is moderate calcification of the aortic valve. Aortic valve regurgitation is severe. Aortic regurgitation PHT measures 353 msec. Aortic valve sclerosis/calcification is present, without any evidence of aortic stenosis. Aortic valve mean gradient measures 6.0 mmHg. Aortic valve peak gradient measures 9.8 mmHg. Aortic valve area, by VTI measures 1.79 cm. Pulmonic Valve: The pulmonic valve was not well visualized. Pulmonic valve regurgitation is trivial. Aorta: The aortic root is normal in size and structure and the ascending aorta was not well visualized. Venous: The inferior vena cava is normal in size with greater than 50% respiratory variability, suggesting right atrial pressure of 3 mmHg. IAS/Shunts: The interatrial septum was not well visualized.  LEFT VENTRICLE PLAX 2D LVIDd:         5.20 cm   Diastology LVIDs:         4.40 cm   LV e' medial:   2.96 cm/s LV PW:         1.20 cm   LV E/e' medial: 5.1 LV IVS:        1.00 cm LVOT diam:     2.30 cm LV SV:         46 LV SV Index:   34 LVOT Area:     4.15 cm  RIGHT VENTRICLE RV Basal diam:  2.40 cm RV S prime:     23.40 cm/s TAPSE (M-mode): 2.7 cm LEFT ATRIUM             Index  RIGHT ATRIUM           Index LA diam:        2.70 cm 1.97 cm/m   RA Area:     19.00 cm LA Vol  (A2C):   36.6 ml 26.67 ml/m  RA Volume:   48.60 ml  35.41 ml/m LA Vol (A4C):   16.1 ml 11.73 ml/m LA Biplane Vol: 24.6 ml 17.92 ml/m  AORTIC VALVE AV Area (Vmax):    1.99 cm AV Area (Vmean):   1.85 cm AV Area (VTI):     1.79 cm AV Vmax:           156.41 cm/s AV Vmean:          116.818 cm/s AV VTI:            0.259 m AV Peak Grad:      9.8 mmHg AV Mean Grad:      6.0 mmHg LVOT Vmax:         74.73 cm/s LVOT Vmean:        52.133 cm/s LVOT VTI:          0.111 m LVOT/AV VTI ratio: 0.43 AI PHT:            353 msec  AORTA Ao Root diam: 3.50 cm Ao Asc diam:  3.90 cm MV E velocity: 14.98 cm/s                            SHUNTS                            Systemic VTI:  0.11 m                            Systemic Diam: 2.30 cm Cherlynn Kaiser MD Electronically signed by Cherlynn Kaiser MD Signature Date/Time: 03/04/2022/2:20:45 PM    Final    CT ABDOMEN PELVIS WO CONTRAST  Result Date: 03/03/2022 CLINICAL DATA:  Abdominal mass with decreased appetite. EXAM: CT ABDOMEN AND PELVIS WITHOUT CONTRAST TECHNIQUE: Multidetector CT imaging of the abdomen and pelvis was performed following the standard protocol without IV contrast. RADIATION DOSE REDUCTION: This exam was performed according to the departmental dose-optimization program which includes automated exposure control, adjustment of the mA and/or kV according to patient size and/or use of iterative reconstruction technique. COMPARISON:  CTA chest today, CTA chest, abdomen and pelvis 02/07/2022 FINDINGS: Lower chest: There is moderate pan chamber cardiomegaly, through three-vessel coronary artery calcifications and a small chronic pericardial effusion. Lung bases show scarring changes and posterior atelectasis without infiltrates. Hepatobiliary: Limited visualization due to artifact from overlying wires and the patient's arms in the field. No obvious focal liver abnormality. Gallbladder is contracted and poorly seen. No biliary dilatation. Pancreas: Limited visualization  due to the patient's arms and overlying wires causing streak artifact. No obvious abnormality. 7 mm lipoma was previously seen posteriorly in the pancreatic neck. Spleen: No obvious abnormality. Adrenals/Urinary Tract: No adrenal or renal masses seen. There is mild bilateral renal volume loss. There is contrast in the collecting systems. No hydronephrosis or ureterectasis is seen. There is contrast opacification of the unremarkable bladder. The wall and lumen are unremarkable. Stomach/Bowel: No dilatation or wall thickening is visible without contrast. An appendix is not seen. Uncomplicated sigmoid diverticulosis. Vascular/Lymphatic: No adenopathy is seen. There is extensive aortoiliac calcification. There is calcification in the  visceral branch arteries. 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 Reproductive: Uterus and bilateral adnexa are unremarkable. Other: There is no free air, free hemorrhage or free fluid. Musculoskeletal: Osteopenia. Moderate L3 and 4 compression fractures have been treated with kyphoplasty in the interval. There is progressive compression fracture of the T8 vertebral body with about 30% overall height loss. Old left hip nailing. Degenerative change and dextro rotary scoliosis lumbar spine. IMPRESSION: 1. No acute noncontrast CT abnormality is seen in the abdomen or pelvis. 2. Cardiomegaly with small chronic pericardial effusion. 3. Aortic and coronary artery atherosclerosis. 4. Extensive aortoiliac calcifications and stable 4.4 x 4.5 cm fusiform distal infrarenal AAA. Possible that the aneurysm could be palpated as a mass because it is in between the abdominal wall and spine. Recommend follow-up every 6 months and vascular  consultation. Reference: J Am Coll Radiol [id number]. 5. 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 6. Osteopenia, scoliosis and degenerative change. 7. Progressive T8 compression fracture with about 30% height loss. 8. L3 and L4 moderate compression fractures have been treated with kyphoplasty in the interval. No increased height loss at either level. Aortic Atherosclerosis (ICD10-I70.0). Electronically Signed   By: Telford Nab M.D.   On: 03/03/2022 21:56   CT Angio Chest PE W and/or Wo Contrast  Result Date: 03/03/2022 CLINICAL DATA:  Pulmonary embolism suspected. High probability. Recent lumbar surgery. EXAM: CT ANGIOGRAPHY CHEST WITH CONTRAST TECHNIQUE: Multidetector CT imaging of the chest was performed using the standard protocol during bolus administration of intravenous contrast. Multiplanar CT image reconstructions and MIPs were obtained to evaluate the vascular anatomy. RADIATION DOSE REDUCTION: This exam was performed according to the departmental dose-optimization program which includes automated exposure control, adjustment of the mA and/or kV according to patient size and/or use of iterative reconstruction technique. CONTRAST:  67m OMNIPAQUE IOHEXOL 350 MG/ML SOLN COMPARISON:  02/07/2022 FINDINGS: Cardiovascular: Chronic cardiomegaly. Coronary artery calcification. Aortic atherosclerotic calcification. Thoracic aortic stent without complicating feature. This is not opacified by contrast as this exam is targeted towards the pulmonary arteries. Pulmonary arterial opacification is good. No pulmonary emboli. Mediastinum/Nodes: No mass or adenopathy. Lungs/Pleura: Chronic upper lobe pulmonary scarring. Minimal dependent atelectasis. No pleural effusion. No sign of pneumonia or mass lesion. Upper Abdomen: Negative Musculoskeletal: Thoracic compression fractures at T5, T6 and T7 appear the same as they did on the prior exam. There has been  progression of compression fractures at T8 and T10. Review of the MIP images confirms the above findings. IMPRESSION: 1. No pulmonary emboli. 2. Chronic cardiomegaly. Coronary artery calcification. Aortic stent without visible complicating feature. 3. Chronic upper lobe pulmonary scarring. 4. Thoracic compression fractures at T5, T6 and T7 appear the same as they did on the prior exam. There has been progression of compression fractures at T8 and T10. Aortic Atherosclerosis (ICD10-I70.0). Electronically Signed   By: MNelson ChimesM.D.   On: 03/03/2022 18:21   DG Chest 2 View  Result Date: 03/03/2022 CLINICAL DATA:  Shortness of breath. EXAM: CHEST - 2 VIEW COMPARISON:  Chest radiograph 06/03/2021 and CTA 02/07/2022 FINDINGS: The cardiac silhouette remains enlarged. A descending thoracic aortic stent graft is again noted. There is chronic interstitial coarsening. Increased, mild  densities in the lung bases compared to the prior radiograph may reflect atelectasis and possibly trace pleural effusions. No pneumothorax is identified. Thoracolumbar scoliosis and multiple compression fractures are again noted. IMPRESSION: Increased bibasilar opacities which may reflect atelectasis and possible trace pleural effusions. Electronically Signed   By: Logan Bores M.D.   On: 03/03/2022 13:16        Scheduled Meds:  furosemide  40 mg Intravenous Daily   levothyroxine  50 mcg Oral Q0600   lidocaine  1 patch Transdermal Q24H   nystatin  5 mL Oral QID   potassium chloride  40 mEq Oral BID   Continuous Infusions:   LOS: 1 day    Time spent: 35 minutes    Barb Merino, MD [facility] Pager [phone]

## 2022-03-05 NOTE — TOC Progression Note (Signed)
Transition of Care [facility]) - Progression Note    Patient Details  Name: Stephanie Collier MRN: [id number] Date of Birth: [birth year]  [facility] CM/SW Contact  Zenon Mayo, RN Phone Number: 03/05/2022, 4:24 PM  Clinical Narrative:    NCM spoke with patient at the bedside, offered choice for [facility] she states she is having surgery on Monday and would like to wait til after surgery to set this up, NCM left the Medicare. Gov list in room with patient.         Expected Discharge Plan and Services                                               Social Determinants of Health (SDOH) Interventions SDOH Screenings   Food Insecurity: No Food Insecurity (02/08/2022)  Housing: Low Risk  (02/08/2022)  Transportation Needs: No Transportation Needs (02/08/2022)  Utilities: Not At Risk (02/08/2022)  Tobacco Use: Low Risk  (03/03/2022)    Readmission Risk Interventions     No data to display

## 2022-03-06 DIAGNOSIS — I509 Heart failure, unspecified: Secondary | ICD-10-CM | POA: Diagnosis not present

## 2022-03-06 DIAGNOSIS — I351 Nonrheumatic aortic (valve) insufficiency: Secondary | ICD-10-CM | POA: Diagnosis not present

## 2022-03-06 DIAGNOSIS — E039 Hypothyroidism, unspecified: Secondary | ICD-10-CM | POA: Diagnosis not present

## 2022-03-06 DIAGNOSIS — I5020 Unspecified systolic (congestive) heart failure: Secondary | ICD-10-CM | POA: Diagnosis not present

## 2022-03-06 DIAGNOSIS — R7989 Other specified abnormal findings of blood chemistry: Secondary | ICD-10-CM

## 2022-03-06 DIAGNOSIS — Z515 Encounter for palliative care: Secondary | ICD-10-CM | POA: Diagnosis not present

## 2022-03-06 DIAGNOSIS — Z7189 Other specified counseling: Secondary | ICD-10-CM | POA: Diagnosis not present

## 2022-03-06 LAB — BASIC METABOLIC PANEL
Anion gap: 11 (ref 5–15)
BUN: 20 mg/dL (ref 8–23)
CO2: 27 mmol/L (ref 22–32)
Calcium: 8.8 mg/dL — ABNORMAL LOW (ref 8.9–10.3)
Chloride: 100 mmol/L (ref 98–111)
Creatinine, Ser: 0.66 mg/dL (ref 0.44–1.00)
GFR, Estimated: >60 mL/min (ref >60)
Glucose, Bld: 98 mg/dL (ref 70–99)
Potassium: 4.5 mmol/L (ref 3.5–5.1)
Sodium: 138 mmol/L (ref 135–145)

## 2022-03-06 LAB — T3, FREE: T3, Free: 3.2 pg/mL (ref 2.0–4.4)

## 2022-03-06 MED ORDER — ORAL CARE MOUTH RINSE: 15.0000 mL | OROMUCOSAL | Status: DC | PRN

## 2022-03-06 MED ORDER — SPIRONOLACTONE 12.5 MG HALF TABLET
12.5000 mg | ORAL_TABLET | Freq: Every day | ORAL | Status: DC
  Administered 2022-03-06 – 2022-03-11 (×6): 12.5 mg via ORAL
  Filled 2022-03-06 (×6): qty 1

## 2022-03-06 MED ORDER — LEVOTHYROXINE SODIUM 25 MCG PO TABS
25.0000 ug | ORAL_TABLET | Freq: Every day | ORAL | Status: DC
  Administered 2022-03-07 – 2022-03-11 (×5): 25 ug via ORAL
  Filled 2022-03-06 (×5): qty 1

## 2022-03-06 NOTE — Progress Notes (Signed)
[facility]                                                                              Stephanie Collier, is a 87 y.o. female, DOB - [birth year], [id number] Admit date - 03/03/2022    Outpatient Primary MD for the patient is Pa, McGregor  LOS - 2  days  Chief Complaint  Patient presents with   Anorexia       Brief summary   87 year old female with history of essential hypertension, AAA, COPD, Raynaud's disease, scoliosis, recent admission for intractable back pain due to multiple spinal fractures status post kyphoplasty, comes back to the hospital with generalized weakness, loss of appetite, shortness of breath.  In the emergency room, slightly tachypneic but not hypoxemic.  100% on room air.  Afebrile.  BNP 1707.  COVID influenza RSV PCR negative.  Troponins 54.  TSH 7.  Admitted with possible new onset congestive heart failure. Echocardiogram with significantly worsened ejection fraction, EF 30 to 35% Cardiology consulted   Assessment & Plan    Principal Problem: Acute systolic CHF, new onset with demand ischemia New cardiomyopathy with wall motion abnormalities -Presented with dyspnea, orthopnea, PND, pedal edema, elevated BNP chest x-ray with interstitial edema, cardiomegaly -2D echo showed EF 30 to 35%, (EF 60% on previous echo in 05/2021), mobile echodensity 0.7x 0.7 cm in the right ventricular outflow tract -Cardiology consulted, plan for cardiac cath on Monday -Continue IV Lasix for diuresis, negative balance of 475 cc -Follow cardiology recommendations if patient needs TEE for the mobile mass in the right ventricular outflow tract, will defer to cardiology -Venous Dopplers lower extremity negative for DVT  Active Problems:    Essential hypertension -BP currently stable, continue Lasix, metoprolol    Compression fracture of body of thoracic vertebra (HCC) -Recent compression fractures with more progression of compression fracture  T8, T10 -Status post kyphoplasty on 02/10/2022.  -  continue conservative management, not a surgical candidate  COPD  -Currently stable, not in any exacerbation continue DuoNebs   Elevated TSH -TSH 7, free T4 1.3 Patient had presented with lethargy and weakness, was started on Synthroid 50 mcg.  Decreased to 25 mcg -Recheck thyroid panel in 4 weeks  Palpable abdominal mass: - CT scan with AAA.  Patient has extensive aortoiliac calcification with stable 4.4 x 4.5 cm fusiform to stable infrarenal AAA, followed outpatient by vascular surgery, remained stable.    Dysphagia and frailty:  -Seen by speech therapy.  Tolerating regular diet and thin liquids.  Swallowing is normal.  Anxiety -Patient was placed on low-dose Xanax for anxiety.    Progressive debility, frailty and failure to thrive: -Seen by PT, recommended SNF.  Lives with her daughter at home. -Palliative care consulted, symptom management Estimated body mass index is 19.46 kg/m as calculated from the following:   Height as of 02/06/22: 5' (1.524 m).   Weight as of this encounter: 45.2 kg.  Code Status: DN DVT Prophylaxis:  enoxaparin (LOVENOX) injection 30 mg Start: 03/05/22 1215 SCDs Start: 03/03/22 2053   Level of Care: Level of care: Telemetry Medical Family Communication: Updated patient Disposition Plan:  Remains inpatient appropriate: On IV Lasix diuresis, pending cardiac cath on Monday   Procedures:  2D echo  Consultants:   Cardiology  Antimicrobials: None   Medications  enoxaparin (LOVENOX) injection  30 mg Subcutaneous Q24H   furosemide  40 mg Intravenous Daily   levothyroxine  50 mcg Oral Q0600   lidocaine  1 patch Transdermal Q24H   metoprolol tartrate  12.5 mg Oral BID   nystatin  5 mL Oral QID      Subjective:   [name] was seen and examined today.   Denies any specific chest pain or acute shortness of breath.  Feeling somewhat better today with anxiety.  No nausea vomiting abdominal  pain or any diarrhea.   Objective:   Vitals:   03/05/22 2200 03/05/22 2209 03/06/22 0444 03/06/22 0832  BP: 124/65 124/65 (!) 107/50 (!) 115/59  Pulse:  83 79 76  Resp: '20  20 18  '$ Temp:   97.6 F (36.4 C) 97.7 F (36.5 C)  TempSrc:   Axillary Axillary  SpO2:   98% 100%  Weight:   45.2 kg     Intake/Output Summary (Last 24 hours) at 03/06/2022 1030 Last data filed at 03/06/2022 0016 Gross per 24 hour  Intake 230 ml  Output 100 ml  Net 130 ml     Wt Readings from Last 3 Encounters:  03/06/22 45.2 kg  02/06/22 44 kg  06/04/21 49.9 kg     Exam General: Alert and oriented, NAD, appears comfortable Cardiovascular: S1 S2 auscultated,  RRR, JVD + Respiratory: Bibasilar crackles Gastrointestinal: Soft, nontender, nondistended, + bowel sounds Ext: no pedal edema bilaterally Neuro: moving all 4 extremities spontaneously Skin: No rashes Psych: Normal affect     Data Reviewed:  I have personally reviewed following labs    CBC Lab Results  Component Value Date   WBC 6.1 03/05/2022   RBC 4.44 03/05/2022   HGB 13.8 03/05/2022   HCT 41.5 03/05/2022   MCV 93.5 03/05/2022   MCH 31.1 03/05/2022   PLT 162 03/05/2022   MCHC 33.3 03/05/2022   RDW 14.6 03/05/2022   LYMPHSABS 1.3 03/05/2022   MONOABS 0.8 03/05/2022   EOSABS 0.1 03/05/2022   BASOSABS 0.0 [id number]     Last metabolic panel Lab Results  Component Value Date   NA 138 03/06/2022   K 4.5 03/06/2022   CL 100 03/06/2022   CO2 27 03/06/2022   BUN 20 03/06/2022   CREATININE 0.66 03/06/2022   GLUCOSE 98 03/06/2022   GFRNONAA >60 03/06/2022   GFRAA >90 04/10/2014   CALCIUM 8.8 (L) 03/06/2022   PHOS 3.7 03/05/2022   PROT 6.0 (L) 03/04/2022   ALBUMIN 3.6 03/04/2022   BILITOT 2.3 (H) 03/04/2022   ALKPHOS 78 03/04/2022   AST 23 03/04/2022   ALT 14 03/04/2022   ANIONGAP 11 03/06/2022    CBG (last 3)  No results for input(s): "GLUCAP" in the last 72 hours.    Coagulation Profile: No results for  input(s): "INR", "PROTIME" in the last 168 hours.   Radiology Studies: I have personally reviewed the imaging studies  VAS [place] LOWER EXTREMITY VENOUS (DVT)  Result Date: 03/05/2022  Lower Venous DVT Study Patient Name:  Stephanie Collier  Date of Exam:   03/05/2022 Medical Rec #: [id number]   Accession #:    [id number] Date of Birth: March 08, 1935   Patient Gender: F Patient Age:   62 years Exam Location:  [facility] Procedure:      VAS [place]  LOWER EXTREMITY VENOUS (DVT) Referring Phys: Barb Merino --------------------------------------------------------------------------------  Indications: Pain, SOB, and weakness.  Risk Factors: DVT hx RLE 04/01/14. Comparison Study: Previous bilateral 04/01/14 right (positive) left (negative) Performing Technologist: McKayla Maag  Examination Guidelines: A complete evaluation includes B-mode imaging, spectral Doppler, color Doppler, and power Doppler as needed of all accessible portions of each vessel. Bilateral testing is considered an integral part of a complete examination. Limited examinations for reoccurring indications may be performed as noted. The reflux portion of the exam is performed with the patient in reverse Trendelenburg.  +---------+---------------+---------+-----------+----------+--------------+ RIGHT    CompressibilityPhasicitySpontaneityPropertiesThrombus Aging +---------+---------------+---------+-----------+----------+--------------+ CFV      Full           Yes      Yes                                 +---------+---------------+---------+-----------+----------+--------------+ SFJ      Full                                                        +---------+---------------+---------+-----------+----------+--------------+ FV Prox  Full                                                        +---------+---------------+---------+-----------+----------+--------------+ FV Mid   Full                                                         +---------+---------------+---------+-----------+----------+--------------+ FV DistalFull                                                        +---------+---------------+---------+-----------+----------+--------------+ PFV      Full                                                        +---------+---------------+---------+-----------+----------+--------------+ POP      Full           Yes      Yes                                 +---------+---------------+---------+-----------+----------+--------------+ PTV      Full                                                        +---------+---------------+---------+-----------+----------+--------------+ PERO     Full                                                        +---------+---------------+---------+-----------+----------+--------------+   +---------+---------------+---------+-----------+----------+--------------+  LEFT     CompressibilityPhasicitySpontaneityPropertiesThrombus Aging +---------+---------------+---------+-----------+----------+--------------+ CFV      Full           Yes      Yes                                 +---------+---------------+---------+-----------+----------+--------------+ SFJ      Full                                                        +---------+---------------+---------+-----------+----------+--------------+ FV Prox  Full                                                        +---------+---------------+---------+-----------+----------+--------------+ FV Mid   Full                                                        +---------+---------------+---------+-----------+----------+--------------+ FV DistalFull                                                        +---------+---------------+---------+-----------+----------+--------------+ PFV      Full                                                         +---------+---------------+---------+-----------+----------+--------------+ POP      Full           Yes      Yes                                 +---------+---------------+---------+-----------+----------+--------------+ PTV      Full                                                        +---------+---------------+---------+-----------+----------+--------------+ PERO     Full                                                        +---------+---------------+---------+-----------+----------+--------------+     Summary: BILATERAL: - No evidence of deep vein thrombosis seen in the lower extremities, bilaterally. - No evidence of superficial venous thrombosis in the lower extremities, bilaterally. -No evidence of popliteal cyst, bilaterally.   *See table(s) above for measurements and observations. Electronically  signed by Orlie Pollen on 03/05/2022 at 6:47:57 PM.    Final    ECHOCARDIOGRAM COMPLETE  Result Date: 03/04/2022    ECHOCARDIOGRAM REPORT   Patient Name:   ANNELIE ALBER Date of Exam: 03/04/2022 Medical Rec #:  [id number]  Height:       60.0 in Accession #:    [id number] Weight:       97.0 lb Date of Birth:  08/17/1935  BSA:          1.372 m Patient Age:    44 years   BP:           157/75 mmHg Patient Gender: F          HR:           86 bpm. Exam Location:  Inpatient Procedure: 2D Echo, Cardiac Doppler and Color Doppler Indications:    I50.21 CHF  History:        Patient has prior history of Echocardiogram examinations, most                 recent 06/04/2021. CHF, COPD and History of thoracic aortic                 aneurysm history of stenting of aorta, Signs/Symptoms:Murmur;                 Risk Factors:Hypertension.  Sonographer:    Lenard Galloway [facility], RDCS Referring Phys: [id number] [facility]  Sonographer Comments: Image difficulty due to kyphoscoliosis and patient unable to lay flat IMPRESSIONS  1. Independently mobile echodensity 0.7 x 0.7 cm, which appears to be in the right  ventricular outflow tract based on parasternal imaging. Image 17-19 best demonstrate, not well seen elsewhere. Consider TEE if clinically relevant.  2. The aortic valve was not well visualized. There is moderate calcification of the aortic valve. Aortic valve regurgitation is severe. Aortic valve sclerosis/calcification is present, without any evidence of aortic stenosis. Stroke volume index 34 ml/m2 and DVI 0.43.  3. Left ventricular ejection fraction, by estimation, is 30 to 35%. The left ventricle has moderately decreased function. The left ventricle demonstrates regional wall motion abnormalities (see scoring diagram/findings for description). There is mild left ventricular hypertrophy. Left ventricular diastolic parameters are indeterminate.  4. Right ventricular systolic function is normal. The right ventricular size is normal. Tricuspid regurgitation signal is inadequate for assessing PA pressure.  5. The mitral valve is grossly normal. Mild mitral valve regurgitation.  6. The inferior vena cava is normal in size with greater than 50% respiratory variability, suggesting right atrial pressure of 3 mmHg. Comparison(s): A prior study was performed on 06/04/21. Prior images reviewed side by side. Aortic valve regurgitation appears similar. LV function has decreased. FINDINGS  Left Ventricle: Left ventricular ejection fraction, by estimation, is 30 to 35%. The left ventricle has moderately decreased function. The left ventricle demonstrates regional wall motion abnormalities. The left ventricular internal cavity size was normal in size. There is mild left ventricular hypertrophy. Left ventricular diastolic parameters are indeterminate. Right Ventricle: The right ventricular size is normal. Right vetricular wall thickness was not well visualized. Right ventricular systolic function is normal. Tricuspid regurgitation signal is inadequate for assessing PA pressure. Left Atrium: Left atrial size was normal in size.  Right Atrium: Right atrial size was normal in size. Pericardium: There is no evidence of pericardial effusion. Mitral Valve: The mitral valve is grossly normal. Mild mitral valve regurgitation. Tricuspid Valve: The tricuspid valve is not well visualized. Tricuspid valve regurgitation  is trivial. Aortic Valve: The aortic valve was not well visualized. There is moderate calcification of the aortic valve. Aortic valve regurgitation is severe. Aortic regurgitation PHT measures 353 msec. Aortic valve sclerosis/calcification is present, without any evidence of aortic stenosis. Aortic valve mean gradient measures 6.0 mmHg. Aortic valve peak gradient measures 9.8 mmHg. Aortic valve area, by VTI measures 1.79 cm. Pulmonic Valve: The pulmonic valve was not well visualized. Pulmonic valve regurgitation is trivial. Aorta: The aortic root is normal in size and structure and the ascending aorta was not well visualized. Venous: The inferior vena cava is normal in size with greater than 50% respiratory variability, suggesting right atrial pressure of 3 mmHg. IAS/Shunts: The interatrial septum was not well visualized.  LEFT VENTRICLE PLAX 2D LVIDd:         5.20 cm   Diastology LVIDs:         4.40 cm   LV e' medial:   2.96 cm/s LV PW:         1.20 cm   LV E/e' medial: 5.1 LV IVS:        1.00 cm LVOT diam:     2.30 cm LV SV:         46 LV SV Index:   34 LVOT Area:     4.15 cm  RIGHT VENTRICLE RV Basal diam:  2.40 cm RV S prime:     23.40 cm/s TAPSE (M-mode): 2.7 cm LEFT ATRIUM             Index        RIGHT ATRIUM           Index LA diam:        2.70 cm 1.97 cm/m   RA Area:     19.00 cm LA Vol (A2C):   36.6 ml 26.67 ml/m  RA Volume:   48.60 ml  35.41 ml/m LA Vol (A4C):   16.1 ml 11.73 ml/m LA Biplane Vol: 24.6 ml 17.92 ml/m  AORTIC VALVE AV Area (Vmax):    1.99 cm AV Area (Vmean):   1.85 cm AV Area (VTI):     1.79 cm AV Vmax:           156.41 cm/s AV Vmean:          116.818 cm/s AV VTI:            0.259 m AV Peak Grad:       9.8 mmHg AV Mean Grad:      6.0 mmHg LVOT Vmax:         74.73 cm/s LVOT Vmean:        52.133 cm/s LVOT VTI:          0.111 m LVOT/AV VTI ratio: 0.43 AI PHT:            353 msec  AORTA Ao Root diam: 3.50 cm Ao Asc diam:  3.90 cm MV E velocity: 14.98 cm/s                            SHUNTS                            Systemic VTI:  0.11 m                            Systemic Diam: 2.30 cm Cherlynn Kaiser MD Electronically signed  by Cherlynn Kaiser MD Signature Date/Time: 03/04/2022/2:20:45 PM    Final        Estill Cotta M.D. [facility] 03/06/2022, 10:30 AM  Available via Epic secure chat 7am-7pm After 7 pm, please refer to night coverage provider listed on amion.

## 2022-03-06 NOTE — Plan of Care (Signed)

## 2022-03-06 NOTE — Progress Notes (Signed)
Palliative Medicine Inpatient Follow Up Note HPI: 87 y.o. female  with past medical history of essential hypertension, AAA, COPD, Raynaud's disease, scoliosis, and recent admission for intractable back pain due to multiple spinal fractures status post kyphoplasty admitted on 03/03/2022 with generalized weakness, loss of appetite, and shortness of breath.  Patient diagnosed with acute CHF.  Chest x-ray with cardiomegaly.  Also with elevated BNP.  She is being treated with IV Lasix.  Patient also found to have stable AAA and more progression of compression fractures of T8 and T10.  She was found with elevated TSH at 7.  PMT consulted to discuss goals of care.    Today's Discussion 03/06/2022  *Please note that this is a verbal dictation therefore any spelling or grammatical errors are due to the "[employer]" system interpretation.  Chart reviewed inclusive of vital signs, progress notes, laboratory results, and diagnostic images.   I met with Stephanie Collier this morning. She shares that she has gotten one of the best sleeps in "weeks" since yesterday from the xananx that was initiated for her anxiety. She goes on to share that her anxiety stemmed from multiple things that were occurring on 2/22 - her present health though also a great deal of emotional distress regarding a child she gave birth to who was a stillborn.   We talked at length about Stephanie Collier's family. She shares with me information about her husband and three daughters. She expresses the importance of them in her life.   We reviewed that Stephanie Collier is not the type of woman to "give up". She shares that the plan for Monday is to get a cardiac catheterization to identify if there is anything that can be stented. If not then she will be accepting of this and move forward toward a rehabilitation plan.   Stephanie Collier reaffirms her desire to be DNAR/DNI. She shares that her daughter, Stephanie Collier took this news in a more troubling way despite many  conversations regarding this in the past. Patient shares she does not want "broken bones, or to live on wires, machines". I shared that we plan to uphold her desires/wishes.  Stephanie Collier shares with me how her health has declined over the past 16 months or so. She shares with me her cardiac diagnosis, multiple falls she has endured, and spinal fractures. She expresses that as a result of these she has ongoing/generalized discomfort in her upper and lower back. She shares that she in the past has been on tramadol which helped a bit initially but then caused BLE swelling and was not noted to be addressing the pain. She shares tylenol does nothing. She feels oxycodone helps somewhat. She expresses that when she mobilized she has generalized discomfort but while laying supine typically experiences none of that. She does not desire that I alter her pain regiment at this time.   We discussed the importance of mobility and nutrition which patient did appear aware of. She is ambivalent to get up today in the setting of having various family members visiting.   Created space and opportunity for patient to explore thoughts feelings and fears regarding current medical situation.She vocalizes hope and acceptance of what is to come. She shares it's hard to plan or know how you will feel about the future until the future present itself.   Questions and concerns addressed/Palliative Support Provided.  ________________________ Addendum:  I have called and left a message for patients daughter to provide a brief update.   Objective Assessment: Vital Signs Vitals:  03/06/22 0444 03/06/22 0832  BP: (!) 107/50 (!) 115/59  Pulse: 79 76  Resp: 20 18  Temp: 97.6 F (36.4 C) 97.7 F (36.5 C)  SpO2: 98% 100%    Intake/Output Summary (Last 24 hours) at 03/06/2022 1211 Last data filed at 03/06/2022 0016 Gross per 24 hour  Intake 230 ml  Output 100 ml  Net 130 ml   Last Weight  Most recent update: 03/06/2022  4:49 AM     Weight  45.2 kg (99 lb 10.4 oz)            Gen:  Frail elderly caucasian F in NAD HEENT: moist mucous membranes CV: Regular rate and rhythm  PULM: ON 2LPM [place], breathing is even and nonlabored ABD: soft/nontender EXT: No edema Neuro: Alert and oriented x3  SUMMARY OF RECOMMENDATIONS   DNAR/DNI --> Patient reconfirmed this  Plan for cardiac catheterization on Monday  Patient accepting of skilled nursing to gain strength  Encouragement of mobility and nutritional intake  Ongoing PMT support  Time Spent: 65 Billing based on MDM: High ______________________________________________________________________________________ [place] Team Team Cell Phone: [phone] Please utilize secure chat with additional questions, if there is no response within 30 minutes please call the above phone number  Palliative Medicine Team providers are available by phone from 7am to 7pm daily and can be reached through the team cell phone.  Should this patient require assistance outside of these hours, please call the patient's attending physician.

## 2022-03-06 NOTE — Progress Notes (Signed)
Progress Note  Patient Name: Stephanie Collier Date of Encounter: 03/06/2022  Primary Cardiologist: Minus Breeding, MD  Subjective   No chest pain or shortness of breath at rest.  She relates to me that back and arthritic pain is also a big contributor to her exertional shortness of breath at home.  Inpatient Medications    Scheduled Meds:  enoxaparin (LOVENOX) injection  30 mg Subcutaneous Q24H   furosemide  40 mg Intravenous Daily   [START ON 03/07/2022] levothyroxine  25 mcg Oral Q0600   lidocaine  1 patch Transdermal Q24H   metoprolol tartrate  12.5 mg Oral BID   nystatin  5 mL Oral QID    PRN Meds: ALPRAZolam, hydrALAZINE, ipratropium-albuterol, naLOXone (NARCAN)  injection, oxyCODONE   Vital Signs    Vitals:   03/05/22 2200 03/05/22 2209 03/06/22 0444 03/06/22 0832  BP: 124/65 124/65 (!) 107/50 (!) 115/59  Pulse:  83 79 76  Resp: 20  20 18  $ Temp:   97.6 F (36.4 C) 97.7 F (36.5 C)  TempSrc:   Axillary Axillary  SpO2:   98% 100%  Weight:   45.2 kg     Intake/Output Summary (Last 24 hours) at 03/06/2022 1126 Last data filed at 03/06/2022 0016 Gross per 24 hour  Intake 230 ml  Output 100 ml  Net 130 ml   Filed Weights   03/05/22 0044 03/05/22 0500 03/06/22 0444  Weight: 45.5 kg 45.1 kg 45.2 kg    Telemetry    Sinus rhythm.  Personally reviewed.  ECG    An ECG dated 03/04/2022 was personally reviewed today and demonstrated:  Sinus rhythm with LVH, IVCD, and left anterior fascicular block.  Physical Exam   GEN: Frail-appearing elderly woman, no acute distress.   Neck: No JVD. Cardiac: RRR, 2/6 diastolic murmur, no gallop.  Respiratory: Nonlabored.  Decreased breath sounds at the bases. GI: Soft, nontender, bowel sounds present. MS: No edema.  Kyphosis noted. Neuro:  Nonfocal. Psych: Alert and oriented x 3. Normal affect.  Labs    Chemistry Recent Labs  Lab 03/04/22 0219 03/05/22 0132 03/06/22 0110  NA 142 139 138  K 3.3* 4.2 4.5  CL 97* 99  100  CO2 28 28 27  $ GLUCOSE 98 122* 98  BUN 12 14 20  $ CREATININE 0.72 0.69 0.66  CALCIUM 9.0 9.1 8.8*  PROT 6.0*  --   --   ALBUMIN 3.6  --   --   AST 23  --   --   ALT 14  --   --   ALKPHOS 78  --   --   BILITOT 2.3*  --   --   GFRNONAA >60 >60 >60  ANIONGAP 17* 12 11     Hematology Recent Labs  Lab 03/03/22 1243 03/05/22 0132  WBC 5.2 6.1  RBC 4.43 4.44  HGB 13.5 13.8  HCT 43.0 41.5  MCV 97.1 93.5  MCH 30.5 31.1  MCHC 31.4 33.3  RDW 14.8 14.6  PLT 159 162    Cardiac Enzymes Recent Labs  Lab 03/03/22 1651 03/04/22 0036 03/04/22 0219 03/04/22 0337 03/04/22 0556  TROPONINIHS 54* 63* 65* 64* 59*    BNP Recent Labs  Lab 03/03/22 1243  BNP 1,707.4*     Radiology    VAS [place] LOWER EXTREMITY VENOUS (DVT)  Result Date: 03/05/2022  Lower Venous DVT Study Patient Name:  Stephanie Collier  Date of Exam:   03/05/2022 Medical Rec #: [id number]   Accession #:  [id number] Date of Birth: 12-25-1935   Patient Gender: F Patient Age:   87 years Exam Location:  [facility] Procedure:      VAS [place] LOWER EXTREMITY VENOUS (DVT) Referring Phys: Barb Merino --------------------------------------------------------------------------------  Indications: Pain, SOB, and weakness.  Risk Factors: DVT hx RLE 04/01/14. Comparison Study: Previous bilateral 04/01/14 right (positive) left (negative) Performing Technologist: McKayla Maag  Examination Guidelines: A complete evaluation includes B-mode imaging, spectral Doppler, color Doppler, and power Doppler as needed of all accessible portions of each vessel. Bilateral testing is considered an integral part of a complete examination. Limited examinations for reoccurring indications may be performed as noted. The reflux portion of the exam is performed with the patient in reverse Trendelenburg.  +---------+---------------+---------+-----------+----------+--------------+ RIGHT    CompressibilityPhasicitySpontaneityPropertiesThrombus Aging  +---------+---------------+---------+-----------+----------+--------------+ CFV      Full           Yes      Yes                                 +---------+---------------+---------+-----------+----------+--------------+ SFJ      Full                                                        +---------+---------------+---------+-----------+----------+--------------+ FV Prox  Full                                                        +---------+---------------+---------+-----------+----------+--------------+ FV Mid   Full                                                        +---------+---------------+---------+-----------+----------+--------------+ FV DistalFull                                                        +---------+---------------+---------+-----------+----------+--------------+ PFV      Full                                                        +---------+---------------+---------+-----------+----------+--------------+ POP      Full           Yes      Yes                                 +---------+---------------+---------+-----------+----------+--------------+ PTV      Full                                                        +---------+---------------+---------+-----------+----------+--------------+  PERO     Full                                                        +---------+---------------+---------+-----------+----------+--------------+   +---------+---------------+---------+-----------+----------+--------------+ LEFT     CompressibilityPhasicitySpontaneityPropertiesThrombus Aging +---------+---------------+---------+-----------+----------+--------------+ CFV      Full           Yes      Yes                                 +---------+---------------+---------+-----------+----------+--------------+ SFJ      Full                                                         +---------+---------------+---------+-----------+----------+--------------+ FV Prox  Full                                                        +---------+---------------+---------+-----------+----------+--------------+ FV Mid   Full                                                        +---------+---------------+---------+-----------+----------+--------------+ FV DistalFull                                                        +---------+---------------+---------+-----------+----------+--------------+ PFV      Full                                                        +---------+---------------+---------+-----------+----------+--------------+ POP      Full           Yes      Yes                                 +---------+---------------+---------+-----------+----------+--------------+ PTV      Full                                                        +---------+---------------+---------+-----------+----------+--------------+ PERO     Full                                                        +---------+---------------+---------+-----------+----------+--------------+  Summary: BILATERAL: - No evidence of deep vein thrombosis seen in the lower extremities, bilaterally. - No evidence of superficial venous thrombosis in the lower extremities, bilaterally. -No evidence of popliteal cyst, bilaterally.   *See table(s) above for measurements and observations. Electronically signed by Orlie Pollen on 03/05/2022 at 6:47:57 PM.    Final    ECHOCARDIOGRAM COMPLETE  Result Date: 03/04/2022    ECHOCARDIOGRAM REPORT   Patient Name:   Stephanie Collier Date of Exam: 03/04/2022 Medical Rec #:  [id number]  Height:       60.0 in Accession #:    [id number] Weight:       97.0 lb Date of Birth:  [birth year]  BSA:          1.372 m Patient Age:    46 years   BP:           157/75 mmHg Patient Gender: F          HR:           86 bpm. Exam Location:  Inpatient Procedure: 2D Echo, Cardiac  Doppler and Color Doppler Indications:    I50.21 CHF  History:        Patient has prior history of Echocardiogram examinations, most                 recent 06/04/2021. CHF, COPD and History of thoracic aortic                 aneurysm history of stenting of aorta, Signs/Symptoms:Murmur;                 Risk Factors:Hypertension.  Sonographer:    Lenard Galloway [facility], RDCS Referring Phys: [id number] [facility]  Sonographer Comments: Image difficulty due to kyphoscoliosis and patient unable to lay flat IMPRESSIONS  1. Independently mobile echodensity 0.7 x 0.7 cm, which appears to be in the right ventricular outflow tract based on parasternal imaging. Image 17-19 best demonstrate, not well seen elsewhere. Consider TEE if clinically relevant.  2. The aortic valve was not well visualized. There is moderate calcification of the aortic valve. Aortic valve regurgitation is severe. Aortic valve sclerosis/calcification is present, without any evidence of aortic stenosis. Stroke volume index 34 ml/m2 and DVI 0.43.  3. Left ventricular ejection fraction, by estimation, is 30 to 35%. The left ventricle has moderately decreased function. The left ventricle demonstrates regional wall motion abnormalities (see scoring diagram/findings for description). There is mild left ventricular hypertrophy. Left ventricular diastolic parameters are indeterminate.  4. Right ventricular systolic function is normal. The right ventricular size is normal. Tricuspid regurgitation signal is inadequate for assessing PA pressure.  5. The mitral valve is grossly normal. Mild mitral valve regurgitation.  6. The inferior vena cava is normal in size with greater than 50% respiratory variability, suggesting right atrial pressure of 3 mmHg. Comparison(s): A prior study was performed on 06/04/21. Prior images reviewed side by side. Aortic valve regurgitation appears similar. LV function has decreased. FINDINGS  Left Ventricle: Left ventricular ejection  fraction, by estimation, is 30 to 35%. The left ventricle has moderately decreased function. The left ventricle demonstrates regional wall motion abnormalities. The left ventricular internal cavity size was normal in size. There is mild left ventricular hypertrophy. Left ventricular diastolic parameters are indeterminate. Right Ventricle: The right ventricular size is normal. Right vetricular wall thickness was not well visualized. Right ventricular systolic function is normal. Tricuspid regurgitation signal is inadequate for assessing PA pressure. Left Atrium: Left atrial size was normal  in size. Right Atrium: Right atrial size was normal in size. Pericardium: There is no evidence of pericardial effusion. Mitral Valve: The mitral valve is grossly normal. Mild mitral valve regurgitation. Tricuspid Valve: The tricuspid valve is not well visualized. Tricuspid valve regurgitation is trivial. Aortic Valve: The aortic valve was not well visualized. There is moderate calcification of the aortic valve. Aortic valve regurgitation is severe. Aortic regurgitation PHT measures 353 msec. Aortic valve sclerosis/calcification is present, without any evidence of aortic stenosis. Aortic valve mean gradient measures 6.0 mmHg. Aortic valve peak gradient measures 9.8 mmHg. Aortic valve area, by VTI measures 1.79 cm. Pulmonic Valve: The pulmonic valve was not well visualized. Pulmonic valve regurgitation is trivial. Aorta: The aortic root is normal in size and structure and the ascending aorta was not well visualized. Venous: The inferior vena cava is normal in size with greater than 50% respiratory variability, suggesting right atrial pressure of 3 mmHg. IAS/Shunts: The interatrial septum was not well visualized.  LEFT VENTRICLE PLAX 2D LVIDd:         5.20 cm   Diastology LVIDs:         4.40 cm   LV e' medial:   2.96 cm/s LV PW:         1.20 cm   LV E/e' medial: 5.1 LV IVS:        1.00 cm LVOT diam:     2.30 cm LV SV:         46 LV  SV Index:   34 LVOT Area:     4.15 cm  RIGHT VENTRICLE RV Basal diam:  2.40 cm RV S prime:     23.40 cm/s TAPSE (M-mode): 2.7 cm LEFT ATRIUM             Index        RIGHT ATRIUM           Index LA diam:        2.70 cm 1.97 cm/m   RA Area:     19.00 cm LA Vol (A2C):   36.6 ml 26.67 ml/m  RA Volume:   48.60 ml  35.41 ml/m LA Vol (A4C):   16.1 ml 11.73 ml/m LA Biplane Vol: 24.6 ml 17.92 ml/m  AORTIC VALVE AV Area (Vmax):    1.99 cm AV Area (Vmean):   1.85 cm AV Area (VTI):     1.79 cm AV Vmax:           156.41 cm/s AV Vmean:          116.818 cm/s AV VTI:            0.259 m AV Peak Grad:      9.8 mmHg AV Mean Grad:      6.0 mmHg LVOT Vmax:         74.73 cm/s LVOT Vmean:        52.133 cm/s LVOT VTI:          0.111 m LVOT/AV VTI ratio: 0.43 AI PHT:            353 msec  AORTA Ao Root diam: 3.50 cm Ao Asc diam:  3.90 cm MV E velocity: 14.98 cm/s                            SHUNTS  Systemic VTI:  0.11 m                            Systemic Diam: 2.30 cm Cherlynn Kaiser MD Electronically signed by Cherlynn Kaiser MD Signature Date/Time: 03/04/2022/2:20:45 PM    Final     Assessment & Plan    1.  HFrEF, newly documented cardiomyopathy with LVEF approximately 20 to 25% by my review, relatively global hypokinesis with some regional variation possibly related to conduction system disease.  RV contraction normal.  Recent with NYHA class III dyspnea.  High-sensitivity troponin I levels are minimally elevated and in flat pattern not suggestive of ACS.  2.  Severe aortic regurgitation, dilated ascending aorta at 39 mm.  Could potentially be contributor to developing cardiomyopathy.  3.  4.4 x 4.5 cm distal infrarenal AAA.  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  4.  Thoracic and lumbar compression fractures with chronic pain.  5.  Essential hypertension.  6.  Failure to thrive with  general frailty and debility.  7.  Abnormal echocardiographic findings with description of echodensity in the RV outflow tract.  I reviewed the images which look to be off axis and potentially imaging the thickened tricuspid septal leaflet.  At this point do not suspect endocarditis.  Chart reviewed including consultation by Dr. Percival Spanish yesterday.  Tentative plans noted for right and left heart catheterization Monday, however per discussion with patient today it seems that conservative management may make more sense at least initially.  Palliative care is seeing the patient and she does express some concerns about invasive testing which is certainly reasonable.  She was placed on low-dose beta-blocker already, would not uptitrate given severe aortic regurgitation.  Will add low-dose Aldactone and continue IV Lasix.  Not a great candidate for SGLT2 inhibitor, and would not tolerate ARNI at this point given blood pressure.  Continue to reevaluate as it relates to next step in testing.  It may be that conservative approach overall makes the most sense.  I would actually consider a TEE before heart catheterization to better assess her aortic valve as this may be the bigger problem to deal with.  She is not a surgical candidate however.  Signed, Rozann Lesches, MD  03/06/2022, 11:26 AM

## 2022-03-07 DIAGNOSIS — I5020 Unspecified systolic (congestive) heart failure: Secondary | ICD-10-CM | POA: Diagnosis not present

## 2022-03-07 DIAGNOSIS — S22000A Wedge compression fracture of unspecified thoracic vertebra, initial encounter for closed fracture: Secondary | ICD-10-CM

## 2022-03-07 DIAGNOSIS — I509 Heart failure, unspecified: Secondary | ICD-10-CM | POA: Diagnosis not present

## 2022-03-07 DIAGNOSIS — Z515 Encounter for palliative care: Secondary | ICD-10-CM | POA: Diagnosis not present

## 2022-03-07 DIAGNOSIS — R7989 Other specified abnormal findings of blood chemistry: Secondary | ICD-10-CM | POA: Diagnosis not present

## 2022-03-07 DIAGNOSIS — I351 Nonrheumatic aortic (valve) insufficiency: Secondary | ICD-10-CM | POA: Diagnosis not present

## 2022-03-07 DIAGNOSIS — E039 Hypothyroidism, unspecified: Secondary | ICD-10-CM | POA: Diagnosis not present

## 2022-03-07 LAB — BASIC METABOLIC PANEL
Anion gap: 6 (ref 5–15)
BUN: 22 mg/dL (ref 8–23)
CO2: 32 mmol/L (ref 22–32)
Calcium: 8.7 mg/dL — ABNORMAL LOW (ref 8.9–10.3)
Chloride: 97 mmol/L — ABNORMAL LOW (ref 98–111)
Creatinine, Ser: 0.78 mg/dL (ref 0.44–1.00)
GFR, Estimated: >60 mL/min (ref >60)
Glucose, Bld: 115 mg/dL — ABNORMAL HIGH (ref 70–99)
Potassium: 4.4 mmol/L (ref 3.5–5.1)
Sodium: 135 mmol/L (ref 135–145)

## 2022-03-07 MED ORDER — POLYETHYLENE GLYCOL 3350 17 G PO PACK
17.0000 g | PACK | Freq: Every day | ORAL | Status: DC
  Administered 2022-03-10: 17 g via ORAL
  Filled 2022-03-07 (×5): qty 1

## 2022-03-07 MED ORDER — FLEET ENEMA 7-19 GM/118ML RE ENEM: 1.0000 | ENEMA | Freq: Every day | RECTAL | Status: DC | PRN

## 2022-03-07 MED ORDER — FUROSEMIDE 10 MG/ML IJ SOLN
60.0000 mg | Freq: Every day | INTRAMUSCULAR | Status: DC
  Administered 2022-03-07 – 2022-03-09 (×3): 60 mg via INTRAVENOUS
  Filled 2022-03-07 (×3): qty 6

## 2022-03-07 MED ORDER — SENNA 8.6 MG PO TABS
2.0000 | ORAL_TABLET | Freq: Every day | ORAL | Status: DC
  Administered 2022-03-07 – 2022-03-08 (×2): 17.2 mg via ORAL
  Filled 2022-03-07 (×4): qty 2

## 2022-03-07 NOTE — Progress Notes (Signed)
Mobility Specialist Progress Note:   03/07/22 1000  Mobility  Activity Transferred from bed to chair  Level of Assistance Contact guard assist, steadying assist  Assistive Device Front wheel walker  Distance Ambulated (ft) 3 ft  Activity Response Tolerated fair  Mobility Referral Yes  $Mobility charge 1 Mobility   Pt agreeable to mobility session with mild encouragement. Required only minG assist during transfer, pt stating "don't touch me". Pt anxious throughout, left in chair with alarm on.  Nelta Numbers Mobility Specialist Please contact via SecureChat or  Rehab office at [phone]

## 2022-03-07 NOTE — Progress Notes (Signed)
   Palliative Medicine Inpatient Follow Up Note   Family meeting held this evening with Thressa Sheller, her daughter Sadan Juedes, and her daughters [name] (on speaker phone), and Marcelo Baldy.   A review of Aracelys's past medical history was completed.  Discussion and education on heart failure and its progressive stages as well as the symptoms patients may experience was held.  Discussed based upon patient's acute on chronic comorbidities when to consider hospice care in the home.  Reviewed with Lucillia the importance of knowing and understanding her wishes.  Marcie Bal shares that she would like to go home with home health.  We talked about the limitations as a result of her daughter Deisy Ozbun's active glaucoma and encroaching surgery.  Recommended finding additional help for possible respite care which family was in agreements with.  Discussed the idea of outpatient palliative support which patient and her 3 daughters were all supportive of.  Ongoing discussions to be held with cardiology tomorrow to better identify cardiac plan moving forward.  Appreciate the transitions of care team offering patient's family 3-4 home health options as well as home palliative options.  Time Spent: 60 minutes Billing based on MDM: High ______________________________________________________________________________________ [facility] Team Team Cell Phone: [phone] Please utilize secure chat with additional questions, if there is no response within 30 minutes please call the above phone number  Palliative Medicine Team providers are available by phone from 7am to 7pm daily and can be reached through the team cell phone.  Should this patient require assistance outside of these hours, please call the patient's attending physician.

## 2022-03-07 NOTE — Progress Notes (Signed)
Progress Note  Patient Name: Stephanie Collier Date of Encounter: 03/07/2022  Primary Cardiologist: Minus Breeding, MD  Subjective   Complains of back pain this morning, did not sleep well last night.  Planning to get up to bedside chair this morning.  No chest pain.  Inpatient Medications    Scheduled Meds:  enoxaparin (LOVENOX) injection  30 mg Subcutaneous Q24H   furosemide  40 mg Intravenous Daily   levothyroxine  25 mcg Oral Q0600   lidocaine  1 patch Transdermal Q24H   metoprolol tartrate  12.5 mg Oral BID   nystatin  5 mL Oral QID   spironolactone  12.5 mg Oral Daily    PRN Meds: ALPRAZolam, hydrALAZINE, ipratropium-albuterol, naLOXone (NARCAN)  injection, mouth rinse, oxyCODONE   Vital Signs    Vitals:   03/06/22 2048 03/07/22 0028 03/07/22 0520 03/07/22 0746  BP: 115/64 114/62  106/62  Pulse: 81 72 66 72  Resp:  20 20 20  $ Temp:  97.7 F (36.5 C) 97.7 F (36.5 C) (!) 97.5 F (36.4 C)  TempSrc:  Oral Oral Oral  SpO2:  99%  91%  Weight:   46.2 kg     Intake/Output Summary (Last 24 hours) at 03/07/2022 0942 Last data filed at 03/07/2022 0907 Gross per 24 hour  Intake 820 ml  Output 450 ml  Net 370 ml   Filed Weights   03/05/22 0500 03/06/22 0444 03/07/22 0520  Weight: 45.1 kg 45.2 kg 46.2 kg    Telemetry    Sinus rhythm.  Personally reviewed.  ECG    An ECG dated 03/04/2022 was personally reviewed today and demonstrated:  Sinus rhythm with LVH, IVCD, and left anterior fascicular block.  Physical Exam   GEN: Frail-appearing elderly woman, no acute distress.   Neck: No JVD. Cardiac: RRR, 2/6 diastolic murmur, no gallop.  Respiratory: Nonlabored.  Decreased breath sounds at the bases. GI: Soft, nontender, bowel sounds present. MS: No edema.  Kyphosis noted. Neuro:  Nonfocal. Psych: Alert and oriented x 3. Normal affect.  Labs    Chemistry Recent Labs  Lab 03/04/22 0219 03/05/22 0132 03/06/22 0110 03/07/22 0044  NA 142 139 138 135  K 3.3*  4.2 4.5 4.4  CL 97* 99 100 97*  CO2 28 28 27 $ 32  GLUCOSE 98 122* 98 115*  BUN 12 14 20 22  $ CREATININE 0.72 0.69 0.66 0.78  CALCIUM 9.0 9.1 8.8* 8.7*  PROT 6.0*  --   --   --   ALBUMIN 3.6  --   --   --   AST 23  --   --   --   ALT 14  --   --   --   ALKPHOS 78  --   --   --   BILITOT 2.3*  --   --   --   GFRNONAA >60 >60 >60 >60  ANIONGAP 17* 12 11 6     $ Hematology Recent Labs  Lab 03/03/22 1243 03/05/22 0132  WBC 5.2 6.1  RBC 4.43 4.44  HGB 13.5 13.8  HCT 43.0 41.5  MCV 97.1 93.5  MCH 30.5 31.1  MCHC 31.4 33.3  RDW 14.8 14.6  PLT 159 162    Cardiac Enzymes Recent Labs  Lab 03/03/22 1651 03/04/22 0036 03/04/22 0219 03/04/22 0337 03/04/22 0556  TROPONINIHS 54* 63* 65* 64* 59*    BNP Recent Labs  Lab 03/03/22 1243  BNP 1,707.4*     Radiology    VAS [place] LOWER EXTREMITY VENOUS (  DVT)  Result Date: 03/05/2022  Lower Venous DVT Study Patient Name:  Stephanie Collier  Date of Exam:   03/05/2022 Medical Rec #: [id number]   Accession #:    [id number] Date of Birth: May 06, 1935   Patient Gender: F Patient Age:   18 years Exam Location:  [facility] Procedure:      VAS [place] LOWER EXTREMITY VENOUS (DVT) Referring Phys: Barb Merino --------------------------------------------------------------------------------  Indications: Pain, SOB, and weakness.  Risk Factors: DVT hx RLE 04/01/14. Comparison Study: Previous bilateral 04/01/14 right (positive) left (negative) Performing Technologist: McKayla Maag  Examination Guidelines: A complete evaluation includes B-mode imaging, spectral Doppler, color Doppler, and power Doppler as needed of all accessible portions of each vessel. Bilateral testing is considered an integral part of a complete examination. Limited examinations for reoccurring indications may be performed as noted. The reflux portion of the exam is performed with the patient in reverse Trendelenburg.  +---------+---------------+---------+-----------+----------+--------------+  RIGHT    CompressibilityPhasicitySpontaneityPropertiesThrombus Aging +---------+---------------+---------+-----------+----------+--------------+ CFV      Full           Yes      Yes                                 +---------+---------------+---------+-----------+----------+--------------+ SFJ      Full                                                        +---------+---------------+---------+-----------+----------+--------------+ FV Prox  Full                                                        +---------+---------------+---------+-----------+----------+--------------+ FV Mid   Full                                                        +---------+---------------+---------+-----------+----------+--------------+ FV DistalFull                                                        +---------+---------------+---------+-----------+----------+--------------+ PFV      Full                                                        +---------+---------------+---------+-----------+----------+--------------+ POP      Full           Yes      Yes                                 +---------+---------------+---------+-----------+----------+--------------+ PTV      Full                                                        +---------+---------------+---------+-----------+----------+--------------+  PERO     Full                                                        +---------+---------------+---------+-----------+----------+--------------+   +---------+---------------+---------+-----------+----------+--------------+ LEFT     CompressibilityPhasicitySpontaneityPropertiesThrombus Aging +---------+---------------+---------+-----------+----------+--------------+ CFV      Full           Yes      Yes                                 +---------+---------------+---------+-----------+----------+--------------+ SFJ      Full                                                         +---------+---------------+---------+-----------+----------+--------------+ FV Prox  Full                                                        +---------+---------------+---------+-----------+----------+--------------+ FV Mid   Full                                                        +---------+---------------+---------+-----------+----------+--------------+ FV DistalFull                                                        +---------+---------------+---------+-----------+----------+--------------+ PFV      Full                                                        +---------+---------------+---------+-----------+----------+--------------+ POP      Full           Yes      Yes                                 +---------+---------------+---------+-----------+----------+--------------+ PTV      Full                                                        +---------+---------------+---------+-----------+----------+--------------+ PERO     Full                                                        +---------+---------------+---------+-----------+----------+--------------+  Summary: BILATERAL: - No evidence of deep vein thrombosis seen in the lower extremities, bilaterally. - No evidence of superficial venous thrombosis in the lower extremities, bilaterally. -No evidence of popliteal cyst, bilaterally.   *See table(s) above for measurements and observations. Electronically signed by Orlie Pollen on 03/05/2022 at 6:47:57 PM.    Final     Assessment & Plan    1.  HFrEF, newly documented cardiomyopathy with LVEF approximately 20 to 25% by my review, relatively global hypokinesis with some regional variation possibly related to conduction system disease.  RV contraction normal.  Recent with NYHA class III dyspnea.  High-sensitivity troponin I levels are minimally elevated and in flat pattern not suggestive of ACS.  2.  Severe aortic  regurgitation, dilated ascending aorta at 39 mm.  Could potentially be contributor to developing cardiomyopathy.  3.  4.4 x 4.5 cm distal infrarenal AAA.  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  4.  Thoracic and lumbar compression fractures with chronic pain.  5.  Essential hypertension.  6.  Failure to thrive with general frailty and debility.  7.  Abnormal echocardiographic findings with description of echodensity in the RV outflow tract.  I reviewed the images which look to be off axis and potentially imaging the thickened tricuspid septal leaflet.  At this point do not suspect endocarditis.  Interval chart reviewed.  Situation discussed again with the patient, also touched base with the palliative care team.  While a cardiac catheterization and TEE could technically be performed, the question is whether we would find something that is easily correctable and actually contribute to extending her life or quality of life.  I think the likelihood of that is low and my sense is that medical therapy makes the most sense at this point.  Plan to increase Lasix dose, continue current dose of Lopressor, also started on Aldactone.  Our service will continue to follow.  Signed, Rozann Lesches, MD  03/07/2022, 9:42 AM

## 2022-03-07 NOTE — Progress Notes (Addendum)
Palliative Medicine Inpatient Follow Up Note HPI: 87 y.o. female  with past medical history of essential hypertension, AAA, COPD, Raynaud's disease, scoliosis, and recent admission for intractable back pain due to multiple spinal fractures status post kyphoplasty admitted on 03/03/2022 with generalized weakness, loss of appetite, and shortness of breath.  Patient diagnosed with acute CHF.  Chest x-ray with cardiomegaly.  Also with elevated BNP.  She is being treated with IV Lasix.  Patient also found to have stable AAA and more progression of compression fractures of T8 and T10.  She was found with elevated TSH at 7.  PMT consulted to discuss goals of care.    Today's Discussion 03/07/2022  *Please note that this is a verbal dictation therefore any spelling or grammatical errors are due to the "[place] [employer]" system interpretation.  Chart reviewed inclusive of vital signs, progress notes, laboratory results, and diagnostic images.   I met with Stephanie Collier this morning. She shares that she slept poorly overnight and kept being bothered by staff. She endorses that she is having pain on her left side "down to my ribs". She additionally shares that she feels more short of breath and is doing a pant pant blow pattern of breathing. She is in agreement with placement of a Lidoderm patch for symptoms.   Stephanie Collier shares with me that she had spoken to the cardiology team yesterday and she remains uncertain as to why she is going to be managed with medications as opposed to having a cardiac catheterization. I was able to provide education on heart failure and it's progressive nature. I shared that eventually even with the best medical management symptom burden can be elevated.   Reviewed that I would speak to the cardiology team and reflect her questions.  Discussed goals for the day and the importance of getting OOB. We reviewed the severity of deconditioning that Stephanie Collier will experience if this is not done.   ___________________ Addendum:  Have again called patients daughter, Stephanie Collier and left a HIPAA compliant VM. To date I have not heard back from her for further [place] conversations.  ___________________ Addendum #2:  Patient's bedside RN, Stephanie Collier shared with me that patient's daughter Stephanie Collier was at bedside and desiring to speak to the palliative medicine team.   I met with Stephanie Collier.  We discussed the conversation I had with Stephanie Collier yesterday.  We reviewed her past medical history inclusive of her AAA, COPD, multiple spinal injuries, prior hip fracture, and active systolic heart failure.  Reviewed the cardiology notes and the discussion of not pursuing a cardiac catheterization.  Patient's sister is very much in agreement with this though she does realize there is a different cardiologist who will be present tomorrow and that there views may differ from the cardiology team over the weekend.  Stephanie Collier and I discussed all sitting down together with Stephanie Collier and her 2 other sisters for further more dedicated conversations as they relate to goals of care.  We discussed that patient's active anxiety does appear to be an issue as related to these difficult conversations. Stephanie Collier asked if it would be possible for [place] to meet separately then to meet with Stephanie Collier in the oncoming days when she is in a more calm state of mind.  We did formally discussed the differences between palliative care and hospice care.  I shared the importance of mobility and nutrition to improve her recovery.  We discussed briefly best case and worst-case scenarios and if Stephanie Collier should not thrive the idea  of hospice either in the home or in an inpatient setting.   Stephanie Collier asked if he will be possible to meet with her, her Stephanie Collier, and call their other sister in [place] later this evening.  I endorsed I would be back by the room once the time was confirmed.  Additional Time: 45  Objective Assessment: Vital Signs Vitals:    03/07/22 0520 03/07/22 0746  BP:  106/62  Pulse: 66 72  Resp: 20 20  Temp: 97.7 F (36.5 C) (!) 97.5 F (36.4 C)  SpO2:  91%    Intake/Output Summary (Last 24 hours) at 03/07/2022 [id number] Last data filed at 03/07/2022 0456 Gross per 24 hour  Intake 320 ml  Output 450 ml  Net -130 ml    Last Weight  Most recent update: 03/07/2022  5:24 AM    Weight  46.2 kg (101 lb 13.6 oz)            Gen:  Frail elderly caucasian F in NAD HEENT: moist mucous membranes CV: Regular rate and rhythm  PULM: ON 2LPM [place], breathing is even and nonlabored ABD: soft/nontender EXT: No edema Neuro: Alert and oriented x3  SUMMARY OF RECOMMENDATIONS   DNAR/DNI   Plan for continued conservative management with medication therapy  Patient accepting of skilled nursing to gain strength  Encouragement of mobility and nutritional intake  Encouraged use of pain medications to better aid in L rib discomfort  Ongoing PMT support  Billing based on MDM: High ______________________________________________________________________________________ [place] Team Team Cell Phone: [phone] Please utilize secure chat with additional questions, if there is no response within 30 minutes please call the above phone number  Palliative Medicine Team providers are available by phone from 7am to 7pm daily and can be reached through the team cell phone.  Should this patient require assistance outside of these hours, please call the patient's attending physician.

## 2022-03-07 NOTE — Plan of Care (Signed)
  Problem: Education: Goal: Ability to demonstrate management of disease process will improve Outcome: Progressing Goal: Ability to verbalize understanding of medication therapies will improve Outcome: Progressing Goal: Individualized Educational Video(s) Outcome: Progressing   Problem: Cardiac: Goal: Ability to achieve and maintain adequate cardiopulmonary perfusion will improve Outcome: Progressing   Problem: Education: Goal: Knowledge of General Education information will improve Description: Including pain rating scale, medication(s)/side effects and non-pharmacologic comfort measures Outcome: Progressing   Problem: Health Behavior/Discharge Planning: Goal: Ability to manage health-related needs will improve Outcome: Progressing   Problem: Clinical Measurements: Goal: Ability to maintain clinical measurements within normal limits will improve Outcome: Progressing Goal: Will remain free from infection Outcome: Progressing Goal: Diagnostic test results will improve Outcome: Progressing Goal: Respiratory complications will improve Outcome: Progressing Goal: Cardiovascular complication will be avoided Outcome: Progressing   Problem: Activity: Goal: Risk for activity intolerance will decrease Outcome: Progressing   Problem: Nutrition: Goal: Adequate nutrition will be maintained Outcome: Progressing   Problem: Coping: Goal: Level of anxiety will decrease Outcome: Progressing   Problem: Elimination: Goal: Will not experience complications related to bowel motility Outcome: Progressing Goal: Will not experience complications related to urinary retention Outcome: Progressing   Problem: Activity: Goal: Capacity to carry out activities will improve Outcome: Not Progressing   Problem: Pain Managment: Goal: General experience of comfort will improve Outcome: Not Progressing

## 2022-03-07 NOTE — Progress Notes (Signed)
[facility]                                                                              Stephanie Collier, is a 87 y.o. female, DOB - 18-Dec-1935, [id number] Admit date - 03/03/2022    Outpatient Primary MD for the patient is Pa, [place]  LOS - 3  days  Chief Complaint  Patient presents with   Anorexia       Brief summary   87 year old female with history of essential hypertension, AAA, COPD, Raynaud's disease, scoliosis, recent admission for intractable back pain due to multiple spinal fractures status post kyphoplasty, comes back to the hospital with generalized weakness, loss of appetite, shortness of breath.  In the emergency room, slightly tachypneic but not hypoxemic.  100% on room air.  Afebrile.  BNP 1707.  COVID influenza RSV PCR negative.  Troponins 54.  TSH 7.  Admitted with possible new onset congestive heart failure. Echocardiogram with significantly worsened ejection fraction, EF 30 to 35% Cardiology consulted   Assessment & Plan    Principal Problem: Acute systolic CHF, new onset with demand ischemia New cardiomyopathy with wall motion abnormalities -Presented with dyspnea, orthopnea, PND, pedal edema, elevated BNP chest x-ray with interstitial edema, cardiomegaly -2D echo showed EF 30 to 35%, (EF 60% on previous echo in 05/2021), mobile echodensity 0.7x 0.7 cm in the right ventricular outflow tract -Venous Dopplers lower extremity negative for DVT -Continue IV Lasix for diuresis, plan for cardiac cath tomorrow 2/26 -Lasix increased to 60 mg IV q. 24 hrs   Active Problems:  Mobile echodensity in the right ventricular outflow tract on echo -Defer to cardiology if patient needs TEE     Essential hypertension -BP currently stable, continue Lasix, metoprolol    Compression fracture of body of thoracic vertebra (HCC) -Recent compression fractures with more progression of compression fracture T8, T10 -Status post kyphoplasty  on 02/10/2022.  -  continue conservative management, not a surgical candidate  COPD  -Currently stable, not in any exacerbation continue DuoNebs   Elevated TSH -TSH 7, free T4 1.3 Patient had presented with lethargy and weakness, was started on Synthroid 50 mcg.  Decreased to 25 mcg -Recheck thyroid panel in 4 weeks  Palpable abdominal mass: - CT scan with AAA.  Patient has extensive aortoiliac calcification with stable 4.4 x 4.5 cm fusiform to stable infrarenal AAA, followed outpatient by vascular surgery, remained stable.    Dysphagia and frailty:  -Seen by speech therapy, currently tolerating heart healthy diet.    Anxiety -Patient was placed on low-dose Xanax for anxiety.    Progressive debility, frailty and failure to thrive: -Seen by PT, recommended SNF.  Lives with her daughter at home. -Palliative care consulted, symptom management Estimated body mass index is 19.89 kg/m as calculated from the following:   Height as of 02/06/22: 5' (1.524 m).   Weight as of this encounter: 46.2 kg.  Code Status: DN DVT Prophylaxis:  enoxaparin (LOVENOX) injection 30 mg Start: 03/05/22 1215 SCDs Start: 03/03/22 2053   Level of Care: Level of care: Telemetry Medical Family Communication: Updated patient Disposition Plan:  Remains inpatient appropriate: On IV Lasix diuresis, pending cardiac cath on Monday   Procedures:  2D echo  Consultants:   Cardiology  Antimicrobials: None   Medications  enoxaparin (LOVENOX) injection  30 mg Subcutaneous Q24H   furosemide  60 mg Intravenous Daily   levothyroxine  25 mcg Oral Q0600   lidocaine  1 patch Transdermal Q24H   metoprolol tartrate  12.5 mg Oral BID   nystatin  5 mL Oral QID   spironolactone  12.5 mg Oral Daily      Subjective:   Stephanie Collier was seen and examined today.  No acute issues, no acute chest pain or shortness of breath.  Did not sleep well last night, having back pain.     Objective:   Vitals:   03/06/22  2048 03/07/22 0028 03/07/22 0520 03/07/22 0746  BP: 115/64 114/62  106/62  Pulse: 81 72 66 72  Resp:  '20 20 20  '$ Temp:  97.7 F (36.5 C) 97.7 F (36.5 C) (!) 97.5 F (36.4 C)  TempSrc:  Oral Oral Oral  SpO2:  99%  91%  Weight:   46.2 kg     Intake/Output Summary (Last 24 hours) at 03/07/2022 1014 Last data filed at 03/07/2022 [id number] Gross per 24 hour  Intake 820 ml  Output 450 ml  Net 370 ml     Wt Readings from Last 3 Encounters:  03/07/22 46.2 kg  02/06/22 44 kg  06/04/21 49.9 kg    Physical Exam General: Alert and oriented, NAD, appears fatigued. Cardiovascular: S1 S2 clear, RRR.  2/6 diastolic murmur, no JVD Respiratory: Decreased BS at bases Gastrointestinal: Soft, nontender, nondistended, NBS Ext: no pedal edema bilaterally Neuro: no new deficits Psych: Normal affect    Data Reviewed:  I have personally reviewed following labs    CBC Lab Results  Component Value Date   WBC 6.1 03/05/2022   RBC 4.44 03/05/2022   HGB 13.8 03/05/2022   HCT 41.5 03/05/2022   MCV 93.5 03/05/2022   MCH 31.1 03/05/2022   PLT 162 03/05/2022   MCHC 33.3 03/05/2022   RDW 14.6 03/05/2022   LYMPHSABS 1.3 03/05/2022   MONOABS 0.8 03/05/2022   EOSABS 0.1 03/05/2022   BASOSABS 0.0 [id number]     Last metabolic panel Lab Results  Component Value Date   NA 135 03/07/2022   K 4.4 03/07/2022   CL 97 (L) 03/07/2022   CO2 32 03/07/2022   BUN 22 03/07/2022   CREATININE 0.78 03/07/2022   GLUCOSE 115 (H) 03/07/2022   GFRNONAA >60 03/07/2022   GFRAA >90 04/10/2014   CALCIUM 8.7 (L) 03/07/2022   PHOS 3.7 03/05/2022   PROT 6.0 (L) 03/04/2022   ALBUMIN 3.6 03/04/2022   BILITOT 2.3 (H) 03/04/2022   ALKPHOS 78 03/04/2022   AST 23 03/04/2022   ALT 14 03/04/2022   ANIONGAP 6 03/07/2022    CBG (last 3)  No results for input(s): "GLUCAP" in the last 72 hours.    Coagulation Profile: No results for input(s): "INR", "PROTIME" in the last 168 hours.   Radiology Studies: I have  personally reviewed the imaging studies  No results found.     Estill Cotta M.D. [facility] 03/07/2022, 10:14 AM  Available via Epic secure chat 7am-7pm After 7 pm, please refer to night coverage provider listed on amion.

## 2022-03-08 DIAGNOSIS — E039 Hypothyroidism, unspecified: Secondary | ICD-10-CM | POA: Diagnosis not present

## 2022-03-08 DIAGNOSIS — S22000A Wedge compression fracture of unspecified thoracic vertebra, initial encounter for closed fracture: Secondary | ICD-10-CM | POA: Diagnosis not present

## 2022-03-08 DIAGNOSIS — I5041 Acute combined systolic (congestive) and diastolic (congestive) heart failure: Secondary | ICD-10-CM | POA: Diagnosis not present

## 2022-03-08 DIAGNOSIS — I351 Nonrheumatic aortic (valve) insufficiency: Secondary | ICD-10-CM | POA: Diagnosis not present

## 2022-03-08 DIAGNOSIS — R7989 Other specified abnormal findings of blood chemistry: Secondary | ICD-10-CM | POA: Diagnosis not present

## 2022-03-08 DIAGNOSIS — I509 Heart failure, unspecified: Secondary | ICD-10-CM | POA: Diagnosis not present

## 2022-03-08 LAB — BASIC METABOLIC PANEL
Anion gap: 7 (ref 5–15)
BUN: 22 mg/dL (ref 8–23)
CO2: 34 mmol/L — ABNORMAL HIGH (ref 22–32)
Calcium: 8.6 mg/dL — ABNORMAL LOW (ref 8.9–10.3)
Chloride: 94 mmol/L — ABNORMAL LOW (ref 98–111)
Creatinine, Ser: 0.9 mg/dL (ref 0.44–1.00)
GFR, Estimated: >60 mL/min (ref >60)
Glucose, Bld: 96 mg/dL (ref 70–99)
Potassium: 3.8 mmol/L (ref 3.5–5.1)
Sodium: 135 mmol/L (ref 135–145)

## 2022-03-08 MED ORDER — METOPROLOL SUCCINATE ER 25 MG PO TB24
12.5000 mg | ORAL_TABLET | Freq: Every day | ORAL | Status: DC
  Administered 2022-03-08 – 2022-03-11 (×4): 12.5 mg via ORAL
  Filled 2022-03-08 (×4): qty 1

## 2022-03-08 MED ORDER — FLEET ENEMA 7-19 GM/118ML RE ENEM
1.0000 | ENEMA | Freq: Once | RECTAL | Status: AC
  Administered 2022-03-08: 1 via RECTAL
  Filled 2022-03-08: qty 1

## 2022-03-08 NOTE — Care Management Important Message (Signed)
Important Message  Patient Details  Name: Stephanie Collier MRN: [id number] Date of Birth: 1935/05/07   Medicare Important Message Given:  Yes     Shelda Altes 03/08/2022, 8:47 AM

## 2022-03-08 NOTE — Plan of Care (Signed)
  Problem: Education: Goal: Ability to demonstrate management of disease process will improve Outcome: Progressing Goal: Ability to verbalize understanding of medication therapies will improve Outcome: Progressing   Problem: Activity: Goal: Capacity to carry out activities will improve Outcome: Progressing   Problem: Education: Goal: Knowledge of General Education information will improve Description: Including pain rating scale, medication(s)/side effects and non-pharmacologic comfort measures Outcome: Progressing   Problem: Health Behavior/Discharge Planning: Goal: Ability to manage health-related needs will improve Outcome: Progressing   Problem: Clinical Measurements: Goal: Ability to maintain clinical measurements within normal limits will improve Outcome: Progressing Goal: Will remain free from infection Outcome: Progressing Goal: Diagnostic test results will improve Outcome: Progressing Goal: Respiratory complications will improve Outcome: Progressing Goal: Cardiovascular complication will be avoided Outcome: Progressing   Problem: Activity: Goal: Risk for activity intolerance will decrease Outcome: Progressing   Problem: Nutrition: Goal: Adequate nutrition will be maintained Outcome: Progressing   Problem: Elimination: Goal: Will not experience complications related to bowel motility Outcome: Progressing Goal: Will not experience complications related to urinary retention Outcome: Progressing   Problem: Pain Managment: Goal: General experience of comfort will improve Outcome: Progressing   Problem: Safety: Goal: Ability to remain free from injury will improve Outcome: Progressing   Problem: Skin Integrity: Goal: Risk for impaired skin integrity will decrease Outcome: Progressing

## 2022-03-08 NOTE — Progress Notes (Signed)
Rounding Note    Patient Name: Stephanie Collier Date of Encounter: 03/08/2022  [place] Cardiologist: Minus Breeding, MD   Subjective   Feeling well.  Questions about the next steps.  Denies chest pain.  She was feeling short of breath today and requested supplemental oxygen.  Inpatient Medications    Scheduled Meds:  enoxaparin (LOVENOX) injection  30 mg Subcutaneous Q24H   furosemide  60 mg Intravenous Daily   levothyroxine  25 mcg Oral Q0600   lidocaine  1 patch Transdermal Q24H   metoprolol tartrate  12.5 mg Oral BID   nystatin  5 mL Oral QID   polyethylene glycol  17 g Oral Daily   senna  2 tablet Oral QHS   spironolactone  12.5 mg Oral Daily   Continuous Infusions:  PRN Meds: ALPRAZolam, hydrALAZINE, ipratropium-albuterol, naLOXone (NARCAN)  injection, mouth rinse, oxyCODONE, sodium phosphate   Vital Signs    Vitals:   03/08/22 0029 03/08/22 0449 03/08/22 0710 03/08/22 0854  BP: (!) 126/58 (!) 117/59 122/62 (!) 98/53  Pulse: 70 66 68 72  Resp: '20 20 18 20  '$ Temp: 97.6 F (36.4 C) 97.6 F (36.4 C) (!) 97.5 F (36.4 C)   TempSrc: Oral Oral Oral   SpO2: 100% 100% 99%   Weight:  44.5 kg      Intake/Output Summary (Last 24 hours) at 03/08/2022 0950 Last data filed at 03/08/2022 0831 Gross per 24 hour  Intake 900 ml  Output 950 ml  Net -50 ml      03/08/2022    4:49 AM 03/07/2022    5:20 AM 03/06/2022    4:44 AM  Last 3 Weights  Weight (lbs) 98 lb 1.7 oz 101 lb 13.6 oz 99 lb 10.4 oz  Weight (kg) 44.5 kg 46.2 kg 45.2 kg      Telemetry    Sinus rhythm.  3 beats NSVT.  13 beats of SVT.- Personally Reviewed  ECG    N/a - Personally Reviewed  Physical Exam   VS:  BP (!) 125/58   Pulse 73   Temp (!) 97.5 F (36.4 C) (Oral)   Resp 20   Wt 44.5 kg   SpO2 99%   BMI 19.16 kg/m  , BMI Body mass index is 19.16 kg/m. GENERAL: Frail.  Mild respiratory distress HEENT: Pupils equal round and reactive, fundi not visualized, oral mucosa  unremarkable NECK:  + jugular venous distention, waveform within normal limits, carotid upstroke brisk and symmetric, no bruits, no thyromegaly LUNGS: Diminished at bases.  No crackles, rhonchi, or wheezes HEART:  RRR.  PMI not displaced or sustained,S1 and S2 within normal limits, no S3, no S4, no clicks, no rubs, no murmurs ABD:  Flat, positive bowel sounds normal in frequency in pitch, no bruits, no rebound, no guarding, no midline pulsatile mass, no hepatomegaly, no splenomegaly EXT:  2 plus pulses throughout, no edema, no cyanosis no clubbing SKIN:  No rashes no nodules NEURO:  Cranial nerves II through XII grossly intact, motor grossly intact throughout PSYCH:  Cognitively intact, oriented to person place and time   Labs    High Sensitivity Troponin:   Recent Labs  Lab 03/03/22 1651 03/04/22 0036 03/04/22 0219 03/04/22 0337 03/04/22 0556  TROPONINIHS 54* 63* 65* 64* 59*     Chemistry Recent Labs  Lab 03/04/22 0219 03/05/22 0132 03/06/22 0110 03/07/22 0044 03/08/22 0049  NA 142 139 138 135 135  K 3.3* 4.2 4.5 4.4 3.8  CL 97* 99 100  97* 94*  CO2 '28 28 27 '$ 32 34*  GLUCOSE 98 122* 98 115* 96  BUN '12 14 20 22 22  '$ CREATININE 0.72 0.69 0.66 0.78 0.90  CALCIUM 9.0 9.1 8.8* 8.7* 8.6*  MG  --  1.8  --   --   --   PROT 6.0*  --   --   --   --   ALBUMIN 3.6  --   --   --   --   AST 23  --   --   --   --   ALT 14  --   --   --   --   ALKPHOS 78  --   --   --   --   BILITOT 2.3*  --   --   --   --   GFRNONAA >60 >60 >60 >60 >60  ANIONGAP 17* '12 11 6 7    '$ Lipids No results for input(s): "CHOL", "TRIG", "HDL", "LABVLDL", "LDLCALC", "CHOLHDL" in the last 168 hours.  Hematology Recent Labs  Lab 03/03/22 1243 03/05/22 0132  WBC 5.2 6.1  RBC 4.43 4.44  HGB 13.5 13.8  HCT 43.0 41.5  MCV 97.1 93.5  MCH 30.5 31.1  MCHC 31.4 33.3  RDW 14.8 14.6  PLT 159 162   Thyroid  Recent Labs  Lab 03/03/22 1224 03/03/22 1652  TSH  --  7.095*  FREET4 1.34*  --     BNP Recent  Labs  Lab 03/03/22 1243  BNP 1,707.4*    DDimer No results for input(s): "DDIMER" in the last 168 hours.   Radiology    No results found.  Cardiac Studies  Echo 03/04/22:  1. Independently mobile echodensity 0.7 x 0.7 cm, which appears to be in  the right ventricular outflow tract based on parasternal imaging. Image  17-19 best demonstrate, not well seen elsewhere. Consider TEE if  clinically relevant.   2. The aortic valve was not well visualized. There is moderate  calcification of the aortic valve. Aortic valve regurgitation is severe.  Aortic valve sclerosis/calcification is present, without any evidence of  aortic stenosis. Stroke volume index 34  ml/m2 and DVI 0.43.   3. Left ventricular ejection fraction, by estimation, is 30 to 35%. The  left ventricle has moderately decreased function. The left ventricle  demonstrates regional wall motion abnormalities (see scoring  diagram/findings for description). There is mild  left ventricular hypertrophy. Left ventricular diastolic parameters are  indeterminate.   4. Right ventricular systolic function is normal. The right ventricular  size is normal. Tricuspid regurgitation signal is inadequate for assessing  PA pressure.   5. The mitral valve is grossly normal. Mild mitral valve regurgitation.   6. The inferior vena cava is normal in size with greater than 50%  respiratory variability, suggesting right atrial pressure of 3 mmHg.   Patient Profile     87 y.o. 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  Assessment & Plan    # Acute systolic heart failure: LVEF 20-25%.  Newly diagnosed this admission.  Global hypokinesis.  High-sensitivity troponin minimally elevated and flat.  She has no chest pain.   Not thought to be due to ACS.  She has no chest pain.  Given her overall prognosis, no plan for invasive workup.  Plan was for right and  left heart cath today.  However palliative care is seeing the patient and she has elected a less invasive course.  Consolidate metoprolol to succinate.  Will reduce the dose to 12.5 mg daily due to bradycardia and her aortic regurgitation as well as hypotension.  Blood pressure does not tolerate further titration of GDMT.  Continue spironolactone.  Will check a BNP in the morning.  Consider switching from IV Lasix to oral tomorrow.  # Coronary calcification:  # Hyperlipidemia: Noted on CT this admission.  Initially there was discussion about left and right heart cath today.  However, after discussion with the weekend team, family and patient have elected for more palliative approach given her overall prognosis and lack of ischemic symptoms.  Check fasting lipids.  # Severe AR:  Avoid bradycardia.  Reduce metoprolol as above.  She is nonoperative candidate.  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  Not a candidate for anticoagulation due to intramural hematoma.  She follows with vascular and was supposed to have repeat scan and follow-up 05/2022.  She was scanned this hospitalization and found to have extensive aortoiliac calcification and a stable 4.4X 4.5 cm fusiform distal infrarenal AAA.  They recommend 42-monthscans and vascular follow-up.      For questions or updates, please contact CW[place]Please consult [email or web address] for contact info under        Signed, TSkeet Latch MD  03/08/2022, 9:50 AM

## 2022-03-08 NOTE — TOC Progression Note (Signed)
Transition of Care [facility]) - Progression Note    Patient Details  Name: Stephanie Collier MRN: [id number] Date of Birth: 07/05/1935  Transition of Care [facility]) CM/SW Contact  Stephanie Mayo, RN Phone Number: 03/08/2022, 3:07 PM  Clinical Narrative:    Per Palliative note, patient will need to be set up with [facility] and outpatient palliative services.  NCM spoke with one of the daughters , Stephanie Collier, she states Stephanie Collier is the POA,  she states they are not sure which [facility] agency they would want, NCM gave her the Medicare. Gov list for [facility] and outpatient palliative services.  She states they will get back with this NCM in the am with their choice.         Expected Discharge Plan and Services                                               Social Determinants of Health (SDOH) Interventions SDOH Screenings   Food Insecurity: No Food Insecurity (02/08/2022)  Housing: Low Risk  (02/08/2022)  Transportation Needs: No Transportation Needs (02/08/2022)  Utilities: Not At Risk (02/08/2022)  Tobacco Use: Low Risk  (03/03/2022)    Readmission Risk Interventions     No data to display

## 2022-03-08 NOTE — Progress Notes (Signed)
[facility]                                                                              Stephanie Collier, is a 87 y.o. female, DOB - 11-15-1935, [id number] Admit date - 03/03/2022    Outpatient Primary MD for the patient is Pa, Engelhard  LOS - 4  days  Chief Complaint  Patient presents with   Anorexia       Brief summary   87 year old female with history of essential hypertension, AAA, COPD, Raynaud's disease, scoliosis, recent admission for intractable back pain due to multiple spinal fractures status post kyphoplasty, comes back to the hospital with generalized weakness, loss of appetite, shortness of breath.  In the emergency room, slightly tachypneic but not hypoxemic.  100% on room air.  Afebrile.  BNP 1707.  COVID influenza RSV PCR negative.  Troponins 54.  TSH 7.  Admitted with possible new onset congestive heart failure. Echocardiogram with significantly worsened ejection fraction, EF 30 to 35% Cardiology consulted   Assessment & Plan    Principal Problem: Acute systolic CHF, new onset with demand ischemia New cardiomyopathy with wall motion abnormalities -Presented with dyspnea, orthopnea, PND, pedal edema, elevated BNP chest x-ray with interstitial edema, cardiomegaly -2D echo showed EF 30 to 35%, (EF 60% on previous echo in 05/2021), mobile echodensity 0.7x 0.7 cm in the right ventricular outflow tract -Venous Dopplers lower extremity negative for DVT -Initial plan for right and left heart cath today, palliative medicine following, patient has elected less invasive course. -Cardiology following, continue IV Lasix for diuresis, Aldactone, +185 cc   Active Problems:  Mobile echodensity in the right ventricular outflow tract on echo -Defer to cardiology if patient needs TEE, at this time, no plans for invasive testing or TEE per cardiology/palliative medicine.    Essential hypertension -BP soft, metoprolol reduced -Currently on  Aldactone, Lasix    Compression fracture of body of thoracic vertebra (HCC) -Recent compression fractures with more progression of compression fracture T8, T10 -Status post kyphoplasty on 02/10/2022.  -  continue conservative management, not a surgical candidate  COPD  -Currently stable, not in any exacerbation continue DuoNebs   Elevated TSH -TSH 7, free T4 1.3 Patient had presented with lethargy and weakness, was started on Synthroid 50 mcg.  Decreased to 25 mcg -Recheck thyroid panel in 4 weeks  Palpable abdominal mass: - CT scan with AAA.  Patient has extensive aortoiliac calcification with stable 4.4 x 4.5 cm fusiform to stable infrarenal AAA, followed outpatient by vascular surgery, remained stable.    Dysphagia and frailty:  -Seen by speech therapy, currently tolerating heart healthy diet.    Anxiety -Patient was placed on low-dose Xanax for anxiety.    Progressive debility, frailty and failure to thrive: -Seen by PT, recommended SNF.  Lives with her daughter at home. -Palliative care consulted, symptom management Estimated body mass index is 19.16 kg/m as calculated from the following:   Height as of 02/06/22: 5' (1.524 m).   Weight as of this encounter: 44.5 kg.  Code Status: DN DVT Prophylaxis:  enoxaparin (LOVENOX) injection 30 mg Start: 03/05/22 1215 SCDs  Start: 03/03/22 2053   Level of Care: Level of care: Telemetry Medical Family Communication: Updated patient Disposition Plan:      Remains inpatient appropriate: On IV Lasix diuresis, cardiology, palliative medicine following   Procedures:  2D echo  Consultants:   Cardiology Palliative medicine  Antimicrobials: None   Medications  enoxaparin (LOVENOX) injection  30 mg Subcutaneous Q24H   furosemide  60 mg Intravenous Daily   levothyroxine  25 mcg Oral Q0600   lidocaine  1 patch Transdermal Q24H   metoprolol succinate  12.5 mg Oral Daily   nystatin  5 mL Oral QID   polyethylene glycol  17 g Oral  Daily   senna  2 tablet Oral QHS   sodium phosphate  1 enema Rectal Once   spironolactone  12.5 mg Oral Daily      Subjective:   Stephanie Collier was seen and examined today.  No chest pain however is somewhat short of breath.  Did not sleep well last night.  No acute nausea vomiting, fevers, abdominal pain.   Objective:   Vitals:   03/08/22 0710 03/08/22 0854 03/08/22 1021 03/08/22 1126  BP: 122/62 (!) 98/53 (!) 125/58 (!) 116/59  Pulse: 68 72 73 74  Resp: 18 20  (!) 24  Temp: (!) 97.5 F (36.4 C)   98.1 F (36.7 C)  TempSrc: Oral   Oral  SpO2: 99%   99%  Weight:        Intake/Output Summary (Last 24 hours) at 03/08/2022 1332 Last data filed at 03/08/2022 1254 Gross per 24 hour  Intake 620 ml  Output 950 ml  Net -330 ml     Wt Readings from Last 3 Encounters:  03/08/22 44.5 kg  02/06/22 44 kg  06/04/21 49.9 kg   Physical Exam General: Alert and oriented x 3, NAD, frail and ill-appearing Cardiovascular: S1 S2 clear, RRR.  JVD+ Respiratory: Diminished breath sound at the bases Gastrointestinal: Soft, nontender, nondistended, NBS Ext: no pedal edema bilaterally Neuro: no new deficits Psych: Normal affect    Data Reviewed:  I have personally reviewed following labs    CBC Lab Results  Component Value Date   WBC 6.1 03/05/2022   RBC 4.44 03/05/2022   HGB 13.8 03/05/2022   HCT 41.5 03/05/2022   MCV 93.5 03/05/2022   MCH 31.1 03/05/2022   PLT 162 03/05/2022   MCHC 33.3 03/05/2022   RDW 14.6 03/05/2022   LYMPHSABS 1.3 03/05/2022   MONOABS 0.8 03/05/2022   EOSABS 0.1 03/05/2022   BASOSABS 0.0 123456     Last metabolic panel Lab Results  Component Value Date   NA 135 03/08/2022   K 3.8 03/08/2022   CL 94 (L) 03/08/2022   CO2 34 (H) 03/08/2022   BUN 22 03/08/2022   CREATININE 0.90 03/08/2022   GLUCOSE 96 03/08/2022   GFRNONAA >60 03/08/2022   GFRAA >90 04/10/2014   CALCIUM 8.6 (L) 03/08/2022   PHOS 3.7 03/05/2022   PROT 6.0 (L) 03/04/2022    ALBUMIN 3.6 03/04/2022   BILITOT 2.3 (H) 03/04/2022   ALKPHOS 78 03/04/2022   AST 23 03/04/2022   ALT 14 03/04/2022   ANIONGAP 7 03/08/2022    CBG (last 3)  No results for input(s): "GLUCAP" in the last 72 hours.    Coagulation Profile: No results for input(s): "INR", "PROTIME" in the last 168 hours.   Radiology Studies: I have personally reviewed the imaging studies  No results found.     Estill Cotta M.D. [facility] 03/08/2022, 1:32 PM  Available via Epic secure chat 7am-7pm After 7 pm, please refer to night coverage provider listed on amion.

## 2022-03-08 NOTE — Progress Notes (Signed)
PT Cancellation Note  Patient Details Name: Stephanie Collier MRN: [id number] DOB: 1935-03-03   Cancelled Treatment:    Reason Eval/Treat Not Completed: Other (comment) (NT made PT aware that pt had just received an enema and was currently not appropriate for PT) Will try again tomorrow.   Viann Shove 03/08/2022, 4:22 PM

## 2022-03-09 DIAGNOSIS — Z7189 Other specified counseling: Secondary | ICD-10-CM | POA: Diagnosis not present

## 2022-03-09 DIAGNOSIS — R7989 Other specified abnormal findings of blood chemistry: Secondary | ICD-10-CM | POA: Diagnosis not present

## 2022-03-09 DIAGNOSIS — I351 Nonrheumatic aortic (valve) insufficiency: Secondary | ICD-10-CM | POA: Diagnosis not present

## 2022-03-09 DIAGNOSIS — I509 Heart failure, unspecified: Secondary | ICD-10-CM | POA: Diagnosis not present

## 2022-03-09 DIAGNOSIS — I5041 Acute combined systolic (congestive) and diastolic (congestive) heart failure: Secondary | ICD-10-CM | POA: Diagnosis not present

## 2022-03-09 DIAGNOSIS — S22000A Wedge compression fracture of unspecified thoracic vertebra, initial encounter for closed fracture: Secondary | ICD-10-CM | POA: Diagnosis not present

## 2022-03-09 DIAGNOSIS — E039 Hypothyroidism, unspecified: Secondary | ICD-10-CM | POA: Diagnosis not present

## 2022-03-09 DIAGNOSIS — I10 Essential (primary) hypertension: Secondary | ICD-10-CM | POA: Diagnosis not present

## 2022-03-09 DIAGNOSIS — Z515 Encounter for palliative care: Secondary | ICD-10-CM | POA: Diagnosis not present

## 2022-03-09 LAB — VITAMIN B1: Vitamin B1 (Thiamine): 116 nmol/L (ref 66.5–200.0)

## 2022-03-09 LAB — BRAIN NATRIURETIC PEPTIDE: B Natriuretic Peptide: 419.2 pg/mL — ABNORMAL HIGH (ref 0.0–100.0)

## 2022-03-09 LAB — BASIC METABOLIC PANEL
Anion gap: 12 (ref 5–15)
BUN: 24 mg/dL — ABNORMAL HIGH (ref 8–23)
CO2: 27 mmol/L (ref 22–32)
Calcium: 8.6 mg/dL — ABNORMAL LOW (ref 8.9–10.3)
Chloride: 94 mmol/L — ABNORMAL LOW (ref 98–111)
Creatinine, Ser: 0.57 mg/dL (ref 0.44–1.00)
GFR, Estimated: >60 mL/min (ref >60)
Glucose, Bld: 101 mg/dL — ABNORMAL HIGH (ref 70–99)
Potassium: 4.4 mmol/L (ref 3.5–5.1)
Sodium: 133 mmol/L — ABNORMAL LOW (ref 135–145)

## 2022-03-09 LAB — CBC
HCT: 41.9 % (ref 36.0–46.0)
Hemoglobin: 13.6 g/dL (ref 12.0–15.0)
MCH: 31.3 pg (ref 26.0–34.0)
MCHC: 32.5 g/dL (ref 30.0–36.0)
MCV: 96.3 fL (ref 80.0–100.0)
Platelets: 123 10*3/uL — ABNORMAL LOW (ref 150–400)
RBC: 4.35 MIL/uL (ref 3.87–5.11)
RDW: 14.4 % (ref 11.5–15.5)
WBC: 6 10*3/uL (ref 4.0–10.5)
nRBC: 0 % (ref 0.0–0.2)

## 2022-03-09 LAB — LIPID PANEL
Cholesterol: 179 mg/dL (ref 0–200)
HDL: 40 mg/dL — ABNORMAL LOW (ref >40)
LDL Cholesterol: 111 mg/dL — ABNORMAL HIGH (ref 0–99)
Total CHOL/HDL Ratio: 4.5 RATIO
Triglycerides: 138 mg/dL (ref <150)
VLDL: 28 mg/dL (ref 0–40)

## 2022-03-09 LAB — CULTURE, BLOOD (SINGLE): Culture: NO GROWTH

## 2022-03-09 MED ORDER — ROSUVASTATIN CALCIUM 5 MG PO TABS
10.0000 mg | ORAL_TABLET | Freq: Every day | ORAL | Status: DC
  Administered 2022-03-09 – 2022-03-11 (×3): 10 mg via ORAL
  Filled 2022-03-09 (×3): qty 2

## 2022-03-09 MED ORDER — FUROSEMIDE 40 MG PO TABS
60.0000 mg | ORAL_TABLET | Freq: Every day | ORAL | Status: DC
  Administered 2022-03-10 – 2022-03-11 (×2): 60 mg via ORAL
  Filled 2022-03-09 (×2): qty 1

## 2022-03-09 MED ORDER — FUROSEMIDE 10 MG/ML IJ SOLN
60.0000 mg | Freq: Once | INTRAMUSCULAR | Status: AC
  Administered 2022-03-09: 60 mg via INTRAVENOUS
  Filled 2022-03-09: qty 6

## 2022-03-09 NOTE — Progress Notes (Signed)
   Palliative Medicine Inpatient Follow Up Note HPI: 87 y.o. female  with past medical history of essential hypertension, AAA, COPD, Raynaud's disease, scoliosis, and recent admission for intractable back pain due to multiple spinal fractures status post kyphoplasty admitted on 03/03/2022 with generalized weakness, loss of appetite, and shortness of breath.  Patient diagnosed with acute CHF.  Chest x-ray with cardiomegaly.  Also with elevated BNP.  She is being treated with IV Lasix.  Patient also found to have stable AAA and more progression of compression fractures of T8 and T10.  She was found with elevated TSH at 7.  PMT consulted to discuss goals of care.    Today's Discussion 03/09/2022  *Please note that this is a verbal dictation therefore any spelling or grammatical errors are due to the "[employer] One" system interpretation.  Chart reviewed inclusive of vital signs, progress notes, laboratory results, and diagnostic images.   Patients daughter, [name] called the PMT and requested a meeting at 13:15 today.   ____________________________________ Addendum:  Family meeting held with patients daughter, [name] and Quiana.   We discussed patients present health state and expectations for care moving forward. Reviewed patient will be going home with [facility] and OP Palliative support,  Discussed PT notes and expectations when home. Discussed with Fernande the importance of daily mobility for strength and wellness.  Reviewed pill burden and the reason for patients present medications. I shared if at anytime the number of medications becomes too much the clinical course can always shift to a more hospice focused emphasis of care. At this time patient wants to continue with medical management..  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 I share to reach out to the [place] as often folks who were active during wartime should have some benefits.   Reviewed likely trajectory  in terms of discharge  Palliative support provided.   Questions answered.  Objective Assessment: Vital Signs Vitals:   03/09/22 1101 03/09/22 1105  BP: (!) 114/54 (!) 114/54  Pulse: 74 75  Resp: 17 19  Temp: 97.6 F (36.4 C)   SpO2: 98% 100%    Intake/Output Summary (Last 24 hours) at 03/09/2022 1303 Last data filed at 03/09/2022 1202 Gross per 24 hour  Intake 200 ml  Output 850 ml  Net -650 ml    Last Weight  Most recent update: 03/09/2022  4:54 AM    Weight  44.9 kg (98 lb 15.8 oz)            Gen:  Frail elderly caucasian F in NAD HEENT: moist mucous membranes CV: Regular rate and rhythm  PULM: ON 2LPM [place], breathing is even and nonlabored ABD: soft/nontender EXT: No edema Neuro: Alert and oriented x3  SUMMARY OF RECOMMENDATIONS   DNAR/DNI   Plan for continued conservative management with medication therapy  Plan for discharge home once optimized with OP Palliative support and [place]  Billing based on MDM: High ______________________________________________________________________________________ [place] Team Team Cell Phone: [phone] Please utilize secure chat with additional questions, if there is no response within 30 minutes please call the above phone number  Palliative Medicine Team providers are available by phone from 7am to 7pm daily and can be reached through the team cell phone.  Should this patient require assistance outside of these hours, please call the patient's attending physician.

## 2022-03-09 NOTE — Progress Notes (Signed)
[facility]) Hospital liaison note  Notified by [facility] manager of patient/family request for [facility] Palliative services at home after discharge.  [facility] will follow patient for discharge disposition.  Please call with any hospice or outpatient palliative care related questions.   Thank you for the opportunity to participate in this patient's care.  [place]  [facility] liaison  [phone]

## 2022-03-09 NOTE — TOC Progression Note (Addendum)
Transition of Care [facility]) - Progression Note    Patient Details  Name: Stephanie Collier MRN: [id number] Date of Birth: 08-03-1935  Transition of Care [facility]) CM/SW Contact  Zenon Mayo, RN Phone Number: 03/09/2022, 8:51 AM  Clinical Narrative:    NCM received call from patient's daughter , Stephanie Collier, she states that they chose [place] for [facility] services and [facility] for outpatient palliative services.  NCM made referral to [facility] with Alvis Lemmings for [facility], [place], [facility] and SW.  NCM made referral to Nicholaus Corolla with [facility] for outpatient palliative services..  Patient may not meet [facility] criteria, so may have to look at another agency. Per Nicholaus Corolla with [facility] she meets the criteria, they will be in touch with family.    1430- NCM was informed the patient daughter , Stephanie Collier , wanted to speak with this NCM.  She states they would like to change from [facility] to [place].  NCM informed Sarah with [facility] Chimera and [name] with Palliative.  NCM making referral to [facility] with [place], she states they will not be admitting til mid March, this NCM gave her the daughter's phone number to call to speak with her and she will call me to let me know if daughter was ok with mid March.  The daughter informed me that she will let me know in 30 minutes who she want to go with for outpatient palliative services. Awaiting to her from daughter which palliative service agency she wants.   Expected Discharge Plan: [place] Barriers to Discharge: Continued Medical Work up  Expected Discharge Plan and Services In-house Referral: NA Discharge Planning Services: CM Consult Post Acute Care Choice: [place] arrangements for the past 2 months: Single Family Home                 DME Arranged: N/A         HH Arranged: RN, PT, Social Work, Nurse's Aide [place] Agency: [place] Date [place]: 03/09/22 Time [place]: [phone] Representative spoke with at [place]: [place] Interventions SDOH Screenings   Food Insecurity: No Food Insecurity (02/08/2022)  Housing: Low Risk  (02/08/2022)  Transportation Needs: No Transportation Needs (02/08/2022)  Utilities: Not At Risk (02/08/2022)  Tobacco Use: Low Risk  (03/03/2022)    Readmission Risk Interventions     No data to display

## 2022-03-09 NOTE — Progress Notes (Signed)
[facility]                                                                              Stephanie Collier, is a 87 y.o. female, DOB - 14-Jun-1935, [id number] Admit date - 03/03/2022    Outpatient Primary MD for the patient is Pa, [place]  LOS - 5  days  Chief Complaint  Patient presents with   Anorexia       Brief summary   87 year old female with history of essential hypertension, AAA, COPD, Raynaud's disease, scoliosis, recent admission for intractable back pain due to multiple spinal fractures status post kyphoplasty, comes back to the hospital with generalized weakness, loss of appetite, shortness of breath.  In the emergency room, slightly tachypneic but not hypoxemic.  100% on room air.  Afebrile.  BNP 1707.  COVID influenza RSV PCR negative.  Troponins 54.  TSH 7.  Admitted with possible new onset congestive heart failure. Echocardiogram with significantly worsened ejection fraction, EF 30 to 35% Cardiology consulted   Assessment & Plan    Principal Problem: Acute systolic CHF, new onset with demand ischemia New cardiomyopathy with wall motion abnormalities -Presented with dyspnea, orthopnea, PND, pedal edema, elevated BNP chest x-ray with interstitial edema, cardiomegaly -2D echo showed EF 30 to 35%, (EF 60% on previous echo in 05/2021), mobile echodensity 0.7x 0.7 cm in the right ventricular outflow tract -Venous Dopplers lower extremity negative for DVT -Cardiology and palliative medicine following.  Patient has elected less invasive course and no cardiac cath. -Per cardiology, additional dose of IV Lasix and possibly switch to oral Lasix tomorrow   Active Problems:  Mobile echodensity in the right ventricular outflow tract on echo -Defer to cardiology, no plans for invasive testing or TEE per cardiology/palliative medicine.    Essential hypertension -BP stable, continue metoprolol  -Continue Lasix, Aldactone    Compression  fracture of body of thoracic vertebra (HCC) -Recent compression fractures with more progression of compression fracture T8, T10 -Status post kyphoplasty on 02/10/2022.  -  continue conservative management, not a surgical candidate  COPD  -Currently stable, not in any exacerbation continue DuoNebs   Elevated TSH -TSH 7, free T4 1.3, presented with lethargy, weakness, started on Synthroid.  -Recheck thyroid panel in 4 weeks  Palpable abdominal mass: - CT scan with AAA.  Patient has extensive aortoiliac calcification with stable 4.4 x 4.5 cm fusiform to stable infrarenal AAA, followed outpatient by vascular surgery, remained stable.    Dysphagia and frailty:  -Seen by speech therapy, currently tolerating heart healthy diet.    Anxiety -Patient was placed on low-dose Xanax for anxiety.    Progressive debility, frailty and failure to thrive: -Seen by PT, recommended SNF.  Lives with her daughter at home. -Palliative care consulted, symptom management Estimated body mass index is 19.33 kg/m as calculated from the following:   Height as of 02/06/22: 5' (1.524 m).   Weight as of this encounter: 44.9 kg.  Code Status: DN DVT Prophylaxis:  enoxaparin (LOVENOX) injection 30 mg Start: 03/05/22 1215 SCDs Start: 03/03/22 2053   Level of Care: Level of care: Telemetry Medical Family Communication:  Updated patient Disposition Plan:      Remains inpatient appropriate: On IV Lasix diuresis, cardiology, palliative medicine following   Procedures:  2D echo  Consultants:   Cardiology Palliative medicine  Antimicrobials: None   Medications  enoxaparin (LOVENOX) injection  30 mg Subcutaneous Q24H   furosemide  60 mg Intravenous Once   [START ON 03/10/2022] furosemide  60 mg Oral Daily   levothyroxine  25 mcg Oral Q0600   lidocaine  1 patch Transdermal Q24H   metoprolol succinate  12.5 mg Oral Daily   nystatin  5 mL Oral QID   polyethylene glycol  17 g Oral Daily   rosuvastatin  10 mg  Oral Daily   senna  2 tablet Oral QHS   spironolactone  12.5 mg Oral Daily      Subjective:   Stephanie Collier was seen and examined today.  No acute complaints except did not sleep well.  No acute shortness of breath, chest pain, nausea or vomiting.  Objective:   Vitals:   03/09/22 0722 03/09/22 0804 03/09/22 1101 03/09/22 1105  BP: (!) 121/54 (!) 121/54 (!) 114/54 (!) 114/54  Pulse: 72 75 74 75  Resp: '16  17 19  '$ Temp: (!) 97.3 F (36.3 C)  97.6 F (36.4 C)   TempSrc:   Oral   SpO2: 100%  98% 100%  Weight:        Intake/Output Summary (Last 24 hours) at 03/09/2022 1329 Last data filed at 03/09/2022 1202 Gross per 24 hour  Intake 200 ml  Output 850 ml  Net -650 ml     Wt Readings from Last 3 Encounters:  03/09/22 44.9 kg  02/06/22 44 kg  06/04/21 49.9 kg    Physical Exam General: Alert and oriented x 3, NAD, frail and ill-appearing Cardiovascular: S1 S2 clear, RRR.  JVD+ Respiratory: Bibasilar crackles Gastrointestinal: Soft, nontender, nondistended, NBS Ext: no pedal edema bilaterally Neuro: moving all 4 extremities spontaneously Psych: Normal affect    Data Reviewed:  I have personally reviewed following labs    CBC Lab Results  Component Value Date   WBC 6.0 03/09/2022   RBC 4.35 03/09/2022   HGB 13.6 03/09/2022   HCT 41.9 03/09/2022   MCV 96.3 03/09/2022   MCH 31.3 03/09/2022   PLT 123 (L) 03/09/2022   MCHC 32.5 03/09/2022   RDW 14.4 03/09/2022   LYMPHSABS 1.3 03/05/2022   MONOABS 0.8 03/05/2022   EOSABS 0.1 03/05/2022   BASOSABS 0.0 [id number]     Last metabolic panel Lab Results  Component Value Date   NA 133 (L) 03/09/2022   K 4.4 03/09/2022   CL 94 (L) 03/09/2022   CO2 27 03/09/2022   BUN 24 (H) 03/09/2022   CREATININE 0.57 03/09/2022   GLUCOSE 101 (H) 03/09/2022   GFRNONAA >60 03/09/2022   GFRAA >90 04/10/2014   CALCIUM 8.6 (L) 03/09/2022   PHOS 3.7 03/05/2022   PROT 6.0 (L) 03/04/2022   ALBUMIN 3.6 03/04/2022   BILITOT 2.3 (H)  03/04/2022   ALKPHOS 78 03/04/2022   AST 23 03/04/2022   ALT 14 03/04/2022   ANIONGAP 12 03/09/2022    CBG (last 3)  No results for input(s): "GLUCAP" in the last 72 hours.    Coagulation Profile: No results for input(s): "INR", "PROTIME" in the last 168 hours.   Radiology Studies: I have personally reviewed the imaging studies  No results found.     Estill Cotta M.D. [facility] 03/09/2022, 1:29 PM  Available via Epic secure  chat 7am-7pm After 7 pm, please refer to night coverage provider listed on amion.

## 2022-03-09 NOTE — Progress Notes (Signed)
Physical Therapy Treatment Patient Details Name: Stephanie Collier MRN: [id number] DOB: [birth year] Today's Date: 03/09/2022   History of Present Illness 87 y.o. female admitted on 03/03/2022 with generalized weakness, loss of appetite, and shortness of breath.  Patient diagnosed with acute CHF.Scan reveal progression of compression fractures of T8 and T10.  PMH essential HTN, AAA, COPD, Raynaud's disease, scoliosis,02/10/22 kyphoplasty    PT Comments    Pt had fair tolerance to treatment. Pt was able to transfer to [facility] however was too fatigued for any further mobility. DC plan updated to HHPT as pt lives with her daughter who is able to provide assistance. Pt would benefit from further OOB mobility during acute stay.   Recommendations for follow up therapy are one component of a multi-disciplinary discharge planning process, led by the attending physician.  Recommendations may be updated based on patient status, additional functional criteria and insurance authorization.  Follow Up Recommendations  Home health PT Can patient physically be transported by private vehicle: No   Assistance Recommended at Discharge Intermittent Supervision/Assistance  Patient can return home with the following A lot of help with walking and/or transfers;A lot of help with bathing/dressing/bathroom;Assistance with cooking/housework;Assist for transportation;Help with stairs or ramp for entrance   Equipment Recommendations  None recommended by PT (Pt has needed DME)    Recommendations for Other Services       Precautions / Restrictions Precautions Precautions: Fall;Back Precaution Booklet Issued: No Precaution Comments: back precautions Restrictions Weight Bearing Restrictions: No     Mobility  Bed Mobility Overal bed mobility: Needs Assistance Bed Mobility: Supine to Sit, Sit to Supine     Supine to sit: Supervision Sit to supine: Mod assist   General bed mobility comments: Pt needed rest break at EOB.  Pt requested assistance with BLE management. Pt reported a cramp in L hip    Transfers Overall transfer level: Needs assistance Equipment used: Rolling walker (2 wheels) Transfers: Bed to chair/wheelchair/BSC, Sit to/from Stand Sit to Stand: Min assist Stand pivot transfers: Min assist              Ambulation/Gait                   Stairs             Wheelchair Mobility    Modified Rankin (Stroke Patients Only)       Balance Overall balance assessment: Mild deficits observed, not formally tested                                          Cognition Arousal/Alertness: Awake/alert Behavior During Therapy: Flat affect Overall Cognitive Status: Within Functional Limits for tasks assessed                                          Exercises      General Comments General comments (skin integrity, edema, etc.): VSS, pt on 2L [place]      Pertinent Vitals/Pain Pain Assessment Pain Assessment: No/denies pain    Home Living                          Prior Function            PT Goals (current goals can now be found  in the care plan section) Progress towards PT goals: Progressing toward goals    Frequency    Min 3X/week      PT Plan Discharge plan needs to be updated    Co-evaluation              AM-PAC PT "6 Clicks" Mobility   Outcome Measure  Help needed turning from your back to your side while in a flat bed without using bedrails?: A Little Help needed moving from lying on your back to sitting on the side of a flat bed without using bedrails?: A Little Help needed moving to and from a bed to a chair (including a wheelchair)?: A Little Help needed standing up from a chair using your arms (e.g., wheelchair or bedside chair)?: A Little Help needed to walk in hospital room?: A Lot Help needed climbing 3-5 steps with a railing? : Total 6 Click Score: 15    End of Session Equipment Utilized  During Treatment: Other (comment) (Deferred gait belt due to pt urgency to use BSC) Activity Tolerance: Patient limited by fatigue Patient left: in bed;with call bell/phone within reach Nurse Communication: Mobility status PT Visit Diagnosis: Other abnormalities of gait and mobility (R26.89);Muscle weakness (generalized) (M62.81);Pain     Time: [id number] PT Time Calculation (min) (ACUTE ONLY): 40 min  Charges:  $Therapeutic Activity: 38-52 mins                     Shelby Mattocks, PT, DPT Acute Rehab Services [id number]    Viann Shove 03/09/2022, 10:17 AM

## 2022-03-09 NOTE — Progress Notes (Signed)
Rounding Note    Patient Name: Stephanie Collier Date of Encounter: 03/09/2022  [place] Cardiologist: Minus Breeding, MD   Subjective   Feeling well.  Breathing is improving.   Inpatient Medications    Scheduled Meds:  enoxaparin (LOVENOX) injection  30 mg Subcutaneous Q24H   furosemide  60 mg Intravenous Daily   levothyroxine  25 mcg Oral Q0600   lidocaine  1 patch Transdermal Q24H   metoprolol succinate  12.5 mg Oral Daily   nystatin  5 mL Oral QID   polyethylene glycol  17 g Oral Daily   senna  2 tablet Oral QHS   spironolactone  12.5 mg Oral Daily   Continuous Infusions:  PRN Meds: ALPRAZolam, hydrALAZINE, ipratropium-albuterol, naLOXone (NARCAN)  injection, mouth rinse, oxyCODONE, sodium phosphate   Vital Signs    Vitals:   03/09/22 0453 03/09/22 0716 03/09/22 0722 03/09/22 0804  BP: (!) 106/44 103/63 (!) 121/54 (!) 121/54  Pulse: 70 73 72 75  Resp: '20 20 16   '$ Temp: 97.8 F (36.6 C) 97.6 F (36.4 C) (!) 97.3 F (36.3 C)   TempSrc: Oral Oral    SpO2: 97% 99% 100%   Weight: 44.9 kg       Intake/Output Summary (Last 24 hours) at 03/09/2022 1000 Last data filed at 03/09/2022 0843 Gross per 24 hour  Intake 320 ml  Output 650 ml  Net -330 ml      03/09/2022    4:53 AM 03/08/2022    4:49 AM 03/07/2022    5:20 AM  Last 3 Weights  Weight (lbs) 98 lb 15.8 oz 98 lb 1.7 oz 101 lb 13.6 oz  Weight (kg) 44.9 kg 44.5 kg 46.2 kg      Telemetry    Sinus rhythm.  3 beats NSVT.  13 beats of SVT.- Personally Reviewed  ECG    N/a - Personally Reviewed  Physical Exam   VS:  BP (!) 121/54   Pulse 75   Temp (!) 97.3 F (36.3 C)   Resp 16   Wt 44.9 kg   SpO2 100%   BMI 19.33 kg/m  , BMI Body mass index is 19.33 kg/m. GENERAL: Frail.  Mild respiratory distress HEENT: Pupils equal round and reactive, fundi not visualized, oral mucosa unremarkable NECK:  + jugular venous distention, waveform within normal limits, carotid upstroke brisk and symmetric,  no bruits, no thyromegaly LUNGS: Crackles at bases.  No rhonchi, or wheezes HEART:  RRR.  PMI not displaced or sustained,S1 and S2 within normal limits, no S3, no S4, no clicks, no rubs, no murmurs ABD:  Flat, positive bowel sounds normal in frequency in pitch, no bruits, no rebound, no guarding, no midline pulsatile mass, no hepatomegaly, no splenomegaly EXT:  2 plus pulses throughout, no edema, no cyanosis no clubbing SKIN:  No rashes no nodules NEURO:  Cranial nerves II through XII grossly intact, motor grossly intact throughout PSYCH:  Cognitively intact, oriented to person place and time   Labs    High Sensitivity Troponin:   Recent Labs  Lab 03/03/22 1651 03/04/22 0036 03/04/22 0219 03/04/22 0337 03/04/22 0556  TROPONINIHS 54* 63* 65* 64* 59*     Chemistry Recent Labs  Lab 03/04/22 0219 03/05/22 0132 03/06/22 0110 03/07/22 0044 03/08/22 0049 03/09/22 0044  NA 142 139   < > 135 135 133*  K 3.3* 4.2   < > 4.4 3.8 4.4  CL 97* 99   < > 97* 94* 94*  CO2 28  28   < > 32 34* 27  GLUCOSE 98 122*   < > 115* 96 101*  BUN 12 14   < > 22 22 24*  CREATININE 0.72 0.69   < > 0.78 0.90 0.57  CALCIUM 9.0 9.1   < > 8.7* 8.6* 8.6*  MG  --  1.8  --   --   --   --   PROT 6.0*  --   --   --   --   --   ALBUMIN 3.6  --   --   --   --   --   AST 23  --   --   --   --   --   ALT 14  --   --   --   --   --   ALKPHOS 78  --   --   --   --   --   BILITOT 2.3*  --   --   --   --   --   GFRNONAA >60 >60   < > >60 >60 >60  ANIONGAP 17* 12   < > '6 7 12   '$ < > = values in this interval not displayed.    Lipids  Recent Labs  Lab 03/09/22 0044  CHOL 179  TRIG 138  HDL 40*  LDLCALC 111*  CHOLHDL 4.5    Hematology Recent Labs  Lab 03/03/22 1243 03/05/22 0132 03/09/22 0044  WBC 5.2 6.1 6.0  RBC 4.43 4.44 4.35  HGB 13.5 13.8 13.6  HCT 43.0 41.5 41.9  MCV 97.1 93.5 96.3  MCH 30.5 31.1 31.3  MCHC 31.4 33.3 32.5  RDW 14.8 14.6 14.4  PLT 159 162 123*   Thyroid  Recent Labs  Lab  03/03/22 1224 03/03/22 1652  TSH  --  7.095*  FREET4 1.34*  --     BNP Recent Labs  Lab 03/03/22 1243 03/09/22 0539  BNP 1,707.4* 419.2*    DDimer No results for input(s): "DDIMER" in the last 168 hours.   Radiology    No results found.  Cardiac Studies  Echo 03/04/22:  1. Independently mobile echodensity 0.7 x 0.7 cm, which appears to be in  the right ventricular outflow tract based on parasternal imaging. Image  17-19 best demonstrate, not well seen elsewhere. Consider TEE if  clinically relevant.   2. The aortic valve was not well visualized. There is moderate  calcification of the aortic valve. Aortic valve regurgitation is severe.  Aortic valve sclerosis/calcification is present, without any evidence of  aortic stenosis. Stroke volume index 34  ml/m2 and DVI 0.43.   3. Left ventricular ejection fraction, by estimation, is 30 to 35%. The  left ventricle has moderately decreased function. The left ventricle  demonstrates regional wall motion abnormalities (see scoring  diagram/findings for description). There is mild  left ventricular hypertrophy. Left ventricular diastolic parameters are  indeterminate.   4. Right ventricular systolic function is normal. The right ventricular  size is normal. Tricuspid regurgitation signal is inadequate for assessing  PA pressure.   5. The mitral valve is grossly normal. Mild mitral valve regurgitation.   6. The inferior vena cava is normal in size with greater than 50%  respiratory variability, suggesting right atrial pressure of 3 mmHg.   Patient Profile     87 y.o. female with chronic systolic and diastolic heart failure (LVEF 30-35%), severe aortic regurgitation, mild ascending aortic aneurysm, 4.5 cm infrarenal AAA, thoracic aorta and treating  ulcer, hypertension admitted failure to thrive and we diagnosed systolic heart failure.  Assessment & Plan    # Acute systolic heart failure: LVEF 20-25%.  Newly diagnosed this  admission.  Global hypokinesis.  High-sensitivity troponin minimally elevated and flat.  She has no chest pain.  Not thought to be due to ACS.  She has no chest pain.  Given her overall prognosis and lack of symptoms, no plan for invasive workup.  Continue metoprolol succinate.  Avoid bradycardia given her aortic regurgitation.  Blood pressure does not tolerate further titration of GDMT.  Continue spironolactone.  BNP downtrending but still elevated and she has faint crackles at the base.  Will give an additional dose of IV lasix this afternoon.  Switch to oral tomorrow.     # Coronary calcification:  # Hyperlipidemia: Noted on CT this admission.  Initially there was discussion about left and right heart cath.  However, after discussion with the weekend team, family and patient have elected for more palliative approach given her overall prognosis and lack of ischemic symptoms.  LDL is 111.  Start rosuvastatin '10mg'$ .  Check lipids/CMP In 2-3 months.   # Severe AR:  Avoid bradycardia.  Reduced metoprolol as above.  She is not an operative candidate.  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  Not a candidate for anticoagulation due to intramural hematoma.  She follows with vascular and was supposed to have repeat scan and follow-up 05/2022.  She was scanned this hospitalization and found to have extensive aortoiliac calcification and a stable 4.4X 4.5 cm fusiform distal infrarenal AAA.  They recommend 66-monthscans and continued vascular follow-up.      For questions or updates, please contact [place] consult [email or web address] for contact info under        Signed, TSkeet Latch MD  03/09/2022, 10:00 AM

## 2022-03-09 NOTE — Plan of Care (Signed)

## 2022-03-10 ENCOUNTER — Inpatient Hospital Stay (HOSPITAL_COMMUNITY): Payer: Medicare Other

## 2022-03-10 DIAGNOSIS — R7989 Other specified abnormal findings of blood chemistry: Secondary | ICD-10-CM | POA: Diagnosis not present

## 2022-03-10 DIAGNOSIS — S22000A Wedge compression fracture of unspecified thoracic vertebra, initial encounter for closed fracture: Secondary | ICD-10-CM | POA: Diagnosis not present

## 2022-03-10 DIAGNOSIS — I509 Heart failure, unspecified: Secondary | ICD-10-CM | POA: Diagnosis not present

## 2022-03-10 DIAGNOSIS — E039 Hypothyroidism, unspecified: Secondary | ICD-10-CM | POA: Diagnosis not present

## 2022-03-10 DIAGNOSIS — I351 Nonrheumatic aortic (valve) insufficiency: Secondary | ICD-10-CM | POA: Diagnosis not present

## 2022-03-10 DIAGNOSIS — I5041 Acute combined systolic (congestive) and diastolic (congestive) heart failure: Secondary | ICD-10-CM | POA: Diagnosis not present

## 2022-03-10 LAB — BASIC METABOLIC PANEL
Anion gap: 9 (ref 5–15)
BUN: 21 mg/dL (ref 8–23)
CO2: 33 mmol/L — ABNORMAL HIGH (ref 22–32)
Calcium: 8.8 mg/dL — ABNORMAL LOW (ref 8.9–10.3)
Chloride: 91 mmol/L — ABNORMAL LOW (ref 98–111)
Creatinine, Ser: 0.71 mg/dL (ref 0.44–1.00)
GFR, Estimated: >60 mL/min (ref >60)
Glucose, Bld: 108 mg/dL — ABNORMAL HIGH (ref 70–99)
Potassium: 4 mmol/L (ref 3.5–5.1)
Sodium: 133 mmol/L — ABNORMAL LOW (ref 135–145)

## 2022-03-10 NOTE — Progress Notes (Signed)
Mobility Specialist - Progress Note   03/10/22 1459  Mobility  Activity Ambulated with assistance in room;Transferred from chair to bed  Level of Assistance Standby assist, set-up cues, supervision of patient - no hands on  Assistive Device Front wheel walker  Distance Ambulated (ft) 15 ft  Activity Response Tolerated well  Mobility Referral Yes  $Mobility charge 1 Mobility    Pt received in recliner and agreeable. Distance limited by c/o nausea. No physical assistance was needed throughout session. Left in bed w/ daughter present and call bell at her side.   [place] Specialist Please contact via SecureChat or Rehab office at [phone]

## 2022-03-10 NOTE — Progress Notes (Signed)
Rounding Note    Patient Name: Stephanie Collier Date of Encounter: 03/10/2022  [place] Cardiologist: Skeet Latch, MD   Subjective   Feeling well.  Breathing is improving.  Notes back pain. Eager to go home.   Inpatient Medications    Scheduled Meds:  enoxaparin (LOVENOX) injection  30 mg Subcutaneous Q24H   furosemide  60 mg Oral Daily   levothyroxine  25 mcg Oral Q0600   lidocaine  1 patch Transdermal Q24H   metoprolol succinate  12.5 mg Oral Daily   nystatin  5 mL Oral QID   polyethylene glycol  17 g Oral Daily   rosuvastatin  10 mg Oral Daily   senna  2 tablet Oral QHS   spironolactone  12.5 mg Oral Daily   Continuous Infusions:  PRN Meds: ALPRAZolam, hydrALAZINE, ipratropium-albuterol, naLOXone (NARCAN)  injection, mouth rinse, oxyCODONE, sodium phosphate   Vital Signs    Vitals:   03/09/22 2009 03/10/22 0025 03/10/22 0605 03/10/22 0743  BP: (!) 110/47 (!) 104/52 (!) 106/57 (!) 124/90  Pulse:  64 73 70  Resp: '17 18 17 17  '$ Temp: 98 F (36.7 C) 98.2 F (36.8 C) 98 F (36.7 C) 98 F (36.7 C)  TempSrc: Oral Oral Oral Oral  SpO2: 100% 96% 93% 95%  Weight:   45 kg     Intake/Output Summary (Last 24 hours) at 03/10/2022 1044 Last data filed at 03/10/2022 [id number] Gross per 24 hour  Intake 360 ml  Output 1150 ml  Net -790 ml      03/10/2022    6:05 AM 03/09/2022    4:53 AM 03/08/2022    4:49 AM  Last 3 Weights  Weight (lbs) 99 lb 3.3 oz 98 lb 15.8 oz 98 lb 1.7 oz  Weight (kg) 45 kg 44.9 kg 44.5 kg      Telemetry    Sinus rhythm.  3 beats NSVT.  13 beats of SVT.- Personally Reviewed  ECG    N/a - Personally Reviewed  Physical Exam   VS:  BP (!) 124/90 (BP Location: Left Arm)   Pulse 70   Temp 98 F (36.7 C) (Oral)   Resp 17   Wt 45 kg   SpO2 95%   BMI 19.38 kg/m  , BMI Body mass index is 19.38 kg/m. GENERAL: Frail.  Mild respiratory distress HEENT: Pupils equal round and reactive, fundi not visualized, oral mucosa  unremarkable NECK:  no jugular venous distention, waveform within normal limits, carotid upstroke brisk and symmetric, no bruits, no thyromegaly LUNGS: Crackles at L base.  No rhonchi, or wheezes HEART:  RRR.  PMI not displaced or sustained,S1 and S2 within normal limits, no S3, no S4, no clicks, no rubs, no murmurs ABD:  Flat, positive bowel sounds normal in frequency in pitch, no bruits, no rebound, no guarding, no midline pulsatile mass, no hepatomegaly, no splenomegaly EXT:  2 plus pulses throughout, no edema, no cyanosis no clubbing SKIN:  No rashes no nodules NEURO:  Cranial nerves II through XII grossly intact, motor grossly intact throughout PSYCH:  Cognitively intact, oriented to person place and time   Labs    High Sensitivity Troponin:   Recent Labs  Lab 03/03/22 1651 03/04/22 0036 03/04/22 0219 03/04/22 0337 03/04/22 0556  TROPONINIHS 54* 63* 65* 64* 59*     Chemistry Recent Labs  Lab 03/04/22 0219 03/05/22 0132 03/06/22 0110 03/08/22 0049 03/09/22 0044 03/10/22 0055  NA 142 139   < > 135 133* 133*  K 3.3* 4.2   < > 3.8 4.4 4.0  CL 97* 99   < > 94* 94* 91*  CO2 28 28   < > 34* 27 33*  GLUCOSE 98 122*   < > 96 101* 108*  BUN 12 14   < > 22 24* 21  CREATININE 0.72 0.69   < > 0.90 0.57 0.71  CALCIUM 9.0 9.1   < > 8.6* 8.6* 8.8*  MG  --  1.8  --   --   --   --   PROT 6.0*  --   --   --   --   --   ALBUMIN 3.6  --   --   --   --   --   AST 23  --   --   --   --   --   ALT 14  --   --   --   --   --   ALKPHOS 78  --   --   --   --   --   BILITOT 2.3*  --   --   --   --   --   GFRNONAA >60 >60   < > >60 >60 >60  ANIONGAP 17* 12   < > '7 12 9   '$ < > = values in this interval not displayed.    Lipids  Recent Labs  Lab 03/09/22 0044  CHOL 179  TRIG 138  HDL 40*  LDLCALC 111*  CHOLHDL 4.5    Hematology Recent Labs  Lab 03/03/22 1243 03/05/22 0132 03/09/22 0044  WBC 5.2 6.1 6.0  RBC 4.43 4.44 4.35  HGB 13.5 13.8 13.6  HCT 43.0 41.5 41.9  MCV 97.1  93.5 96.3  MCH 30.5 31.1 31.3  MCHC 31.4 33.3 32.5  RDW 14.8 14.6 14.4  PLT 159 162 123*   Thyroid  Recent Labs  Lab 03/03/22 1224 03/03/22 1652  TSH  --  7.095*  FREET4 1.34*  --     BNP Recent Labs  Lab 03/03/22 1243 03/09/22 0539  BNP 1,707.4* 419.2*    DDimer No results for input(s): "DDIMER" in the last 168 hours.   Radiology    DG Ribs Unilateral Left  Result Date: 03/10/2022 CLINICAL DATA:  Complains of posterior left rib pain EXAM: LEFT RIBS - 2 VIEW COMPARISON:  CT chest 03/03/2022. FINDINGS: The bones appear osteopenic. There are no signs acute displaced rib fracture. Previous stent graft repair of the descending thoracic aorta. Multiple thoracic compression deformities are again seen as noted on recent chest CT. IMPRESSION: 1. No acute findings.  No displaced rib fractures identified. 2. Osteopenia. 3. Multiple thoracic compression deformities. Electronically Signed   By: Kerby Moors M.D.   On: 03/10/2022 10:26    Cardiac Studies  Echo 03/04/22:  1. Independently mobile echodensity 0.7 x 0.7 cm, which appears to be in  the right ventricular outflow tract based on parasternal imaging. Image  17-19 best demonstrate, not well seen elsewhere. Consider TEE if  clinically relevant.   2. The aortic valve was not well visualized. There is moderate  calcification of the aortic valve. Aortic valve regurgitation is severe.  Aortic valve sclerosis/calcification is present, without any evidence of  aortic stenosis. Stroke volume index 34  ml/m2 and DVI 0.43.   3. Left ventricular ejection fraction, by estimation, is 30 to 35%. The  left ventricle has moderately decreased function. The left ventricle  demonstrates regional wall motion abnormalities (see  scoring  diagram/findings for description). There is mild  left ventricular hypertrophy. Left ventricular diastolic parameters are  indeterminate.   4. Right ventricular systolic function is normal. The right ventricular   size is normal. Tricuspid regurgitation signal is inadequate for assessing  PA pressure.   5. The mitral valve is grossly normal. Mild mitral valve regurgitation.   6. The inferior vena cava is normal in size with greater than 50%  respiratory variability, suggesting right atrial pressure of 3 mmHg.   Patient Profile     87 y.o. 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  Assessment & Plan    # Acute systolic heart failure: LVEF 20-25%.  Newly diagnosed this admission.  Global hypokinesis.  High-sensitivity troponin minimally elevated and flat.  She has no chest pain.  Not thought to be due to ACS.  She has no chest pain.  Given her overall prognosis and lack of symptoms, no plan for invasive workup.  Continue metoprolol succinate.  Avoid bradycardia given her aortic regurgitation.  Blood pressure does not tolerate further titration of GDMT.  Continue spironolactone.  BNP downtrending but still elevated and she has faint crackles at the base.  Start oral lasix today.     # Coronary calcification:  # Hyperlipidemia: Noted on CT this admission.  Initially there was discussion about left and right heart cath.  However, after discussion with the weekend team, family and patient have elected for more palliative approach given her overall prognosis and lack of ischemic symptoms.  LDL is 111.  Started rosuvastatin '10mg'$ .  Check lipids/CMP In 2-3 months.   # Severe AR:  Avoid bradycardia.  Reduced metoprolol as above.  She is not an operative candidate.  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  Not a candidate for anticoagulation due to intramural hematoma.  She follows with vascular and was supposed to have repeat scan and follow-up 05/2022.  She was scanned this hospitalization and found to have extensive aortoiliac calcification and a stable 4.4X 4.5 cm fusiform distal infrarenal AAA.  They recommend 48-monthscans and continued vascular follow-up.      For questions or updates, please contact [place] consult [email or web address] for contact info under        Signed, TSkeet Latch MD  03/10/2022, 10:44 AM

## 2022-03-10 NOTE — Progress Notes (Signed)
[facility]                                                                              Stephanie Collier, is a 87 y.o. female, DOB - 19-Oct-1935, [id number] Admit date - 03/03/2022    Outpatient Primary MD for the patient is Pa, [place]  LOS - 6  days  Chief Complaint  Patient presents with   Anorexia       Brief summary   87 year old female with history of essential hypertension, AAA, COPD, Raynaud's disease, scoliosis, recent admission for intractable back pain due to multiple spinal fractures status post kyphoplasty, comes back to the hospital with generalized weakness, loss of appetite, shortness of breath.  In the emergency room, slightly tachypneic but not hypoxemic.  100% on room air.  Afebrile.  BNP 1707.  COVID influenza RSV PCR negative.  Troponins 54.  TSH 7.  Admitted with possible new onset congestive heart failure. Echocardiogram with significantly worsened ejection fraction, EF 30 to 35% Cardiology consulted   Assessment & Plan    Principal Problem: Acute systolic CHF, new onset with demand ischemia New cardiomyopathy with wall motion abnormalities -Presented with dyspnea, orthopnea, PND, pedal edema, elevated BNP chest x-ray with interstitial edema, cardiomegaly -2D echo showed EF 30 to 35%, (EF 60% on previous echo in 05/2021), mobile echodensity 0.7x 0.7 cm in the right ventricular outflow tract -Venous Dopplers lower extremity negative for DVT -Cardiology and palliative medicine following.  Patient has elected less invasive course and no cardiac cath. -Received IV Lasix, transition to oral Lasix today.  Negative balance of 1.0 L.    Active Problems:  Mobile echodensity in the right ventricular outflow tract on echo -Defer to cardiology, no plans for invasive testing or TEE per cardiology/palliative medicine.    Essential hypertension -BP stable, continue metoprolol  -Continue Aldactone, to addition to oral Lasix  today  Multiple compression fractures of thoracic vertebra (HCC), chronic back pain -Recent compression fractures with more progression of compression fracture T8, T10 -Status post kyphoplasty on 02/10/2022.  Not a surgical candidate. -Patient also complaining of left rib pain, - x-rays negative for any rib fractures  COPD  -Currently stable, not in any exacerbation continue DuoNebs   Elevated TSH -TSH 7, free T4 1.3, presented with lethargy, weakness, started on Synthroid.  -Recheck thyroid panel in 4 weeks  Palpable abdominal mass: - CT scan with AAA.  Patient has extensive aortoiliac calcification with stable 4.4 x 4.5 cm fusiform to stable infrarenal AAA, followed outpatient by vascular surgery, remained stable.    Dysphagia and frailty:  -Seen by speech therapy, currently tolerating heart healthy diet.    Anxiety -Patient was placed on low-dose Xanax for anxiety.    Progressive debility, frailty and failure to thrive: -Seen by PT, recommended SNF.  Lives with her daughter at home. -Palliative care consulted, symptom management Estimated body mass index is 19.38 kg/m as calculated from the following:   Height as of 02/06/22: 5' (1.524 m).   Weight as of this encounter: 45 kg.  Code Status: DN DVT Prophylaxis:  enoxaparin (LOVENOX) injection 30 mg Start: 03/05/22 1215 SCDs  Start: 03/03/22 2053   Level of Care: Level of care: Telemetry Medical Family Communication: Updated patient's daughter, [name] at the bedside Disposition Plan:      Remains inpatient appropriate: Plan to DC home tomorrow.  PT recommended home health PT   Procedures:  2D echo  Consultants:   Cardiology Palliative medicine  Antimicrobials: None   Medications  enoxaparin (LOVENOX) injection  30 mg Subcutaneous Q24H   furosemide  60 mg Oral Daily   levothyroxine  25 mcg Oral Q0600   lidocaine  1 patch Transdermal Q24H   metoprolol succinate  12.5 mg Oral Daily   nystatin  5 mL Oral QID    polyethylene glycol  17 g Oral Daily   rosuvastatin  10 mg Oral Daily   senna  2 tablet Oral QHS   spironolactone  12.5 mg Oral Daily      Subjective:   Stephanie Collier was seen and examined today.  Complaining of back pain, left-sided rib pain, daughter at the bedside.  No acute shortness of breath, fevers or chills, nausea or vomiting.  Eating breakfast. Objective:   Vitals:   03/09/22 2009 03/10/22 0025 03/10/22 0605 03/10/22 0743  BP: (!) 110/47 (!) 104/52 (!) 106/57 (!) 124/90  Pulse:  64 73 70  Resp: '17 18 17 17  '$ Temp: 98 F (36.7 C) 98.2 F (36.8 C) 98 F (36.7 C) 98 F (36.7 C)  TempSrc: Oral Oral Oral Oral  SpO2: 100% 96% 93% 95%  Weight:   45 kg     Intake/Output Summary (Last 24 hours) at 03/10/2022 1125 Last data filed at 03/10/2022 [id number] Gross per 24 hour  Intake 360 ml  Output 1150 ml  Net -790 ml     Wt Readings from Last 3 Encounters:  03/10/22 45 kg  02/06/22 44 kg  06/04/21 49.9 kg   Physical Exam General: Alert and oriented x 3, NAD Cardiovascular: S1 S2 clear, RRR.  No JVD Respiratory: Bibasilar crackles Gastrointestinal: Soft, nontender, nondistended, NBS Ext: no pedal edema bilaterally Neuro: no new deficits Psych: Normal affect   Data Reviewed:  I have personally reviewed following labs    CBC Lab Results  Component Value Date   WBC 6.0 03/09/2022   RBC 4.35 03/09/2022   HGB 13.6 03/09/2022   HCT 41.9 03/09/2022   MCV 96.3 03/09/2022   MCH 31.3 03/09/2022   PLT 123 (L) 03/09/2022   MCHC 32.5 03/09/2022   RDW 14.4 03/09/2022   LYMPHSABS 1.3 03/05/2022   MONOABS 0.8 03/05/2022   EOSABS 0.1 03/05/2022   BASOSABS 0.0 123456     Last metabolic panel Lab Results  Component Value Date   NA 133 (L) 03/10/2022   K 4.0 03/10/2022   CL 91 (L) 03/10/2022   CO2 33 (H) 03/10/2022   BUN 21 03/10/2022   CREATININE 0.71 03/10/2022   GLUCOSE 108 (H) 03/10/2022   GFRNONAA >60 03/10/2022   GFRAA >90 04/10/2014   CALCIUM 8.8 (L)  03/10/2022   PHOS 3.7 03/05/2022   PROT 6.0 (L) 03/04/2022   ALBUMIN 3.6 03/04/2022   BILITOT 2.3 (H) 03/04/2022   ALKPHOS 78 03/04/2022   AST 23 03/04/2022   ALT 14 03/04/2022   ANIONGAP 9 03/10/2022    CBG (last 3)  No results for input(s): "GLUCAP" in the last 72 hours.    Coagulation Profile: No results for input(s): "INR", "PROTIME" in the last 168 hours.   Radiology Studies: I have personally reviewed the imaging studies  DG Ribs  Unilateral Left  Result Date: 03/10/2022 CLINICAL DATA:  Complains of posterior left rib pain EXAM: LEFT RIBS - 2 VIEW COMPARISON:  CT chest 03/03/2022. FINDINGS: The bones appear osteopenic. There are no signs acute displaced rib fracture. Previous stent graft repair of the descending thoracic aorta. Multiple thoracic compression deformities are again seen as noted on recent chest CT. IMPRESSION: 1. No acute findings.  No displaced rib fractures identified. 2. Osteopenia. 3. Multiple thoracic compression deformities. Electronically Signed   By: Kerby Moors M.D.   On: 03/10/2022 10:26       Alizzon Dioguardi M.D. [facility] 03/10/2022, 11:25 AM  Available via Epic secure chat 7am-7pm After 7 pm, please refer to night coverage provider listed on amion.

## 2022-03-10 NOTE — Progress Notes (Signed)
Occupational Therapy Treatment Patient Details Name: Stephanie Collier MRN: [id number] DOB: 1935-06-06 Today's Date: 03/10/2022   History of present illness 87 y.o. female admitted on 03/03/2022 with generalized weakness, loss of appetite, and shortness of breath.  Patient diagnosed with acute CHF.Scan reveal progression of compression fractures of T8 and T10.  PMH essential HTN, AAA, COPD, Raynaud's disease, scoliosis,02/10/22 kyphoplasty   OT comments  Pt making slow progression toward goals this session, remains limited by pain/fatigue. Pt needing max A for LB ADL and min A for simulated step pivot transfer, pt fatiguing quickly taking 2-3 steps before needing to rest. Pt stating she will be able to "do this at home". Encouraged continued mobility and up to chair to day with NSG/MT if possible. Pt presenting with impairments listed below, will follow acutely. Recommend SNF at d/c pending progression, unless family able to provide 24/7 assist.   Recommendations for follow up therapy are one component of a multi-disciplinary discharge planning process, led by the attending physician.  Recommendations may be updated based on patient status, additional functional criteria and insurance authorization.    Follow Up Recommendations  Skilled nursing-short term rehab (<3 hours/day) (could go home with [place] if pt has 24/7 assist at home)     Assistance Recommended at Discharge Intermittent Supervision/Assistance  Patient can return home with the following  Assist for transportation;Assistance with cooking/housework;A little help with walking and/or transfers;A little help with bathing/dressing/bathroom   Equipment Recommendations  Wheelchair (measurements OT);Wheelchair cushion (measurements OT)    Recommendations for Other Services PT consult    Precautions / Restrictions Precautions Precautions: Fall;Back Precaution Booklet Issued: No Precaution Comments: back precautions Restrictions Weight Bearing  Restrictions: No       Mobility Bed Mobility Overal bed mobility: Needs Assistance     Sidelying to sit: Min guard     Sit to sidelying: Min guard General bed mobility comments: increased time and cues to scoot to EOB    Transfers Overall transfer level: Needs assistance Equipment used: Rolling walker (2 wheels) Transfers: Bed to chair/wheelchair/BSC, Sit to/from Stand Sit to Stand: Min guard, Min assist           General transfer comment: able to take 2-3 steps before needing rest break and requesting to lay down     Balance Overall balance assessment: Needs assistance Sitting-balance support: Feet supported Sitting balance-Leahy Scale: Good     Standing balance support: Bilateral upper extremity supported, Reliant on assistive device for balance, During functional activity Standing balance-Leahy Scale: Poor Standing balance comment: relaint on external support                           ADL either performed or assessed with clinical judgement   ADL Overall ADL's : Needs assistance/impaired                     Lower Body Dressing: Maximal assistance;Sitting/lateral leans Lower Body Dressing Details (indicate cue type and reason): donning shoes Toilet Transfer: Minimal assistance;Stand-pivot;Rolling walker (2 wheels);BSC/3in1 Toilet Transfer Details (indicate cue type and reason): simulated via functional mobility x2         Functional mobility during ADLs: Minimal assistance      Extremity/Trunk Assessment Upper Extremity Assessment Upper Extremity Assessment: Generalized weakness   Lower Extremity Assessment Lower Extremity Assessment: Defer to PT evaluation        Vision       Perception Perception Perception: Not tested   Praxis Praxis  Praxis: Not tested    Cognition Arousal/Alertness: Awake/alert Behavior During Therapy: Flat affect Overall Cognitive Status: Impaired/Different from baseline Area of Impairment:  Following commands, Safety/judgement, Awareness                       Following Commands: Follows one step commands with increased time Safety/Judgement: Decreased awareness of safety, Decreased awareness of deficits Awareness: Emergent   General Comments: decreased insight to deficits, stating she will "be able to do this at home with her lift chair" when needing assist to stand and only able to take 2-3 steps before needing to sit        Exercises      Shoulder Instructions       General Comments VSS on RA    Pertinent Vitals/ Pain       Pain Assessment Pain Assessment: No/denies pain Pain Score: 5  Faces Pain Scale: Hurts even more Pain Location: back, L hip, L little toe Pain Descriptors / Indicators: Sore, Constant Pain Intervention(s): Limited activity within patient's tolerance, Monitored during session, Repositioned  Home Living   Living Arrangements: Children Available Help at Discharge: Family;Available 24 hours/day                                    Prior Functioning/Environment              Frequency  Min 2X/week        Progress Toward Goals  OT Goals(current goals can now be found in the care plan section)  Progress towards OT goals: Progressing toward goals  Acute Rehab OT Goals Patient Stated Goal: to go home OT Goal Formulation: With patient Time For Goal Achievement: 03/19/22 Potential to Achieve Goals: Good ADL Goals Pt Will Perform Upper Body Bathing: with set-up;sitting Pt Will Transfer to Toilet: with supervision;ambulating;bedside commode Additional ADL Goal #1: pt will complete bed mobility supervision level as precursor to [place] Discharge plan remains appropriate;Frequency remains appropriate    Co-evaluation                 AM-PAC OT "6 Clicks" Daily Activity     Outcome Measure   Help from another person eating meals?: None Help from another person taking care of personal grooming?: A  Little Help from another person toileting, which includes using toliet, bedpan, or urinal?: A Little Help from another person bathing (including washing, rinsing, drying)?: A Lot Help from another person to put on and taking off regular upper body clothing?: A Little Help from another person to put on and taking off regular lower body clothing?: A Lot 6 Click Score: 17    End of Session Equipment Utilized During Treatment: Gait belt;Rolling walker (2 wheels)  OT Visit Diagnosis: Unsteadiness on feet (R26.81)   Activity Tolerance Patient limited by pain   Patient Left in bed;with call bell/phone within reach;with bed alarm set;with family/visitor present   Nurse Communication Mobility status;Precautions        Time: 1030-1103 OT Time Calculation (min): 33 min  Charges: OT General Charges $OT Visit: 1 Visit OT Treatments $Self Care/Home Management : 8-22 mins $Therapeutic Activity: 8-22 mins  Renaye Rakers, OTD, OTR/L SecureChat Preferred Acute Rehab [phone]   Renaye Rakers [place] 03/10/2022, 1:45 PM

## 2022-03-11 DIAGNOSIS — E039 Hypothyroidism, unspecified: Secondary | ICD-10-CM | POA: Diagnosis not present

## 2022-03-11 DIAGNOSIS — R7989 Other specified abnormal findings of blood chemistry: Secondary | ICD-10-CM | POA: Diagnosis not present

## 2022-03-11 DIAGNOSIS — S22000A Wedge compression fracture of unspecified thoracic vertebra, initial encounter for closed fracture: Secondary | ICD-10-CM | POA: Diagnosis not present

## 2022-03-11 DIAGNOSIS — I509 Heart failure, unspecified: Secondary | ICD-10-CM | POA: Diagnosis not present

## 2022-03-11 LAB — BASIC METABOLIC PANEL
Anion gap: 9 (ref 5–15)
BUN: 21 mg/dL (ref 8–23)
CO2: 35 mmol/L — ABNORMAL HIGH (ref 22–32)
Calcium: 9 mg/dL (ref 8.9–10.3)
Chloride: 92 mmol/L — ABNORMAL LOW (ref 98–111)
Creatinine, Ser: 0.78 mg/dL (ref 0.44–1.00)
GFR, Estimated: >60 mL/min (ref >60)
Glucose, Bld: 106 mg/dL — ABNORMAL HIGH (ref 70–99)
Potassium: 4 mmol/L (ref 3.5–5.1)
Sodium: 136 mmol/L (ref 135–145)

## 2022-03-11 MED ORDER — DAPAGLIFLOZIN PROPANEDIOL 10 MG PO TABS
10.0000 mg | ORAL_TABLET | Freq: Every day | ORAL | Status: DC
  Administered 2022-03-11: 10 mg via ORAL
  Filled 2022-03-11: qty 1

## 2022-03-11 MED ORDER — LEVOTHYROXINE SODIUM 25 MCG PO TABS: 25.0000 ug | ORAL_TABLET | Freq: Every day | ORAL | 3 refills | Status: AC

## 2022-03-11 MED ORDER — FUROSEMIDE 40 MG PO TABS: 40.0000 mg | ORAL_TABLET | Freq: Every day | ORAL | 3 refills | Status: DC

## 2022-03-11 MED ORDER — LIDOCAINE 5 % EX PTCH: 1.0000 | MEDICATED_PATCH | CUTANEOUS | 0 refills | Status: DC

## 2022-03-11 MED ORDER — DAPAGLIFLOZIN PROPANEDIOL 10 MG PO TABS: 10.0000 mg | ORAL_TABLET | Freq: Every day | ORAL | 3 refills | Status: DC

## 2022-03-11 MED ORDER — MIRTAZAPINE 7.5 MG PO TABS: 7.5000 mg | ORAL_TABLET | Freq: Every day | ORAL | 1 refills | Status: DC

## 2022-03-11 MED ORDER — SPIRONOLACTONE 25 MG PO TABS: 12.5000 mg | ORAL_TABLET | Freq: Every day | ORAL | 3 refills | Status: DC

## 2022-03-11 MED ORDER — METOPROLOL SUCCINATE ER 25 MG PO TB24: 12.5000 mg | ORAL_TABLET | Freq: Every day | ORAL | 3 refills | Status: DC

## 2022-03-11 MED ORDER — MIRTAZAPINE 15 MG PO TABS: 7.5000 mg | ORAL_TABLET | Freq: Every day | ORAL | Status: DC

## 2022-03-11 MED ORDER — NYSTATIN 100000 UNIT/ML MT SUSP: 5.0000 mL | Freq: Four times a day (QID) | OROMUCOSAL | 0 refills | Status: AC

## 2022-03-11 MED ORDER — ROSUVASTATIN CALCIUM 10 MG PO TABS: 10.0000 mg | ORAL_TABLET | Freq: Every day | ORAL | 3 refills | Status: DC

## 2022-03-11 NOTE — Discharge Summary (Signed)
Physician Discharge Summary   Patient: Stephanie Collier MRN: [id number] DOB: [birth year]  Admit date:     03/03/2022  Discharge date: 03/11/22  Discharge Physician: Estill Cotta, MD    PCP: [facility]   Recommendations at discharge:   Continue Lasix 40 mg daily and extra 40 mg as needed Farxiga 10 mg daily Toprol-XL decreased to 12.5 mg daily Continue Remeron 7.5 mg daily at bedtime Continue oxycodone 5 mg daily as needed for pain, during daytime Recommended 10-monthscans and continued vascular follow-up for AAA and penetrating aortic ulcer Started on Synthroid 25 mcg daily, follow TSH, T4 in 4 weeks  Discharge Diagnoses:   Acute systolic CHF, newly diagnosed this admission   Essential hypertension   Compression fracture of body of thoracic vertebra (HCC)   Elevated troponin   Oral thrush  Hypothyroidism   COPD (chronic obstructive pulmonary disease) (HCC)   Severe aortic regurgitation   AAA and penetrating aortic ulcer  RVOT mass  Progressive debility and failure to thrive  Hospital Course:  87year old female with history of essential hypertension, AAA, COPD, Raynaud's disease, scoliosis, recent admission for intractable back pain due to multiple spinal fractures status post kyphoplasty, comes back to the hospital with generalized weakness, loss of appetite, shortness of breath.  In the emergency room, slightly tachypneic but not hypoxemic.  100% on room air.  Afebrile.  BNP 1707.  COVID influenza RSV PCR negative.  Troponins 54.  TSH 7.  Admitted with possible new onset congestive heart failure. Echocardiogram with significantly worsened ejection fraction, EF 30 to 35% Cardiology consulted    Assessment and Plan:   Acute systolic CHF, new onset with demand ischemia New cardiomyopathy with wall motion abnormalities -Presented with dyspnea, orthopnea, PND, pedal edema, elevated BNP chest x-ray with interstitial edema, cardiomegaly -2D echo showed EF 30  to 35%, (EF 60% on previous echo in 05/2021), mobile echodensity 0.7x 0.7 cm in the right ventricular outflow tract -Venous Dopplers lower extremity negative for DVT -Cardiology and palliative medicine was consulted.  Patient has elected less invasive course and no cardiac cath. -Patient received IV lasix for diuresis and has been transitioned to oral Lasix. -Cardiology recommended 40 mg daily, and extra 40 mg as needed for fluids/edema, weight gain.  Outpatient follow-up with cardiology     Mobile echodensity in the right ventricular outflow tract on echo -per cardiology, no plans for invasive testing or TEE      Essential hypertension -BP stable, continue metoprolol  -Continue Aldactone, Lasix   Multiple compression fractures of thoracic vertebra ([place], chronic back pain -Recent compression fractures with more progression of compression fracture T8, T10 -Status post kyphoplasty on 02/10/2022.  Not a surgical candidate. -Patient also complaining of left rib pain, - x-rays negative for any rib fractures   COPD  -Currently stable, not in any exacerbation continue DuoNebs    Elevated TSH -TSH 7, free T4 1.3, presented with lethargy, weakness, started on Synthroid.  -Recheck thyroid panel in 4 weeks   Palpable abdominal mass: - CT scan with AAA.  Patient has extensive aortoiliac calcification with stable 4.4 x 4.5 cm fusiform to stable infrarenal AAA, followed outpatient by vascular surgery, remained stable.    Dysphagia and frailty:  -Seen by speech therapy, currently tolerating heart healthy diet.     Anxiety, insomnia, anorexia, FTT -Started on low-dose Remeron at bedtime, discussed with daughter at the bedside   Progressive debility, frailty and failure to thrive: -Seen by PT, recommended SNF.  Lives  with her daughter at home. -Palliative care consulted, symptom management Estimated body mass index is 19.38 kg/m as calculated from the following:   Height as of 02/06/22: 5'  (1.524 m).   Weight as of this encounter: 45 kg.      Pain control - [employer] Controlled Substance Reporting System database was reviewed. and patient was instructed, not to drive, operate heavy machinery, perform activities at heights, swimming or participation in water activities or provide baby-sitting services while on Pain, Sleep and Anxiety Medications; until their outpatient Physician has advised to do so again. Also recommended to not to take more than prescribed Pain, Sleep and Anxiety Medications.  Consultants: Cardiology, palliative medicine Procedures performed:   Disposition: Home Diet recommendation:  Discharge Diet Orders (From admission, onward)     Start     Ordered   03/11/22 0000  Diet - low sodium heart healthy        03/11/22 1123            DISCHARGE MEDICATION: Allergies as of 03/11/2022       Reactions   Fentanyl Shortness Of Breath   Acetaminophen Other (See Comments)   Constipation    Asa [aspirin] Other (See Comments)   Per patient stomach cramps   Azithromycin Diarrhea   Calcium Carb-magnesium Carb Other (See Comments)   skin lesions   Fosamax [alendronate] Other (See Comments)   indigestion   Labetalol Hcl Other (See Comments)    indigestion        Medication List     STOP taking these medications    calcitonin (salmon) 200 UNIT/ACT nasal spray Commonly known as: MIACALCIN/FORTICAL   traMADol 50 MG tablet Commonly known as: ULTRAM       TAKE these medications    acetaminophen 325 MG tablet Commonly known as: TYLENOL Take 2 tablets (650 mg total) by mouth every 6 (six) hours as needed for mild pain (or Fever >/= 101).   dapagliflozin propanediol 10 MG Tabs tablet Commonly known as: FARXIGA Take 1 tablet (10 mg total) by mouth daily. Start taking on: March 12, 2022   docusate sodium 100 MG capsule Commonly known as: COLACE Take 1 capsule (100 mg total) by mouth 2 (two) times daily. What changed:  when to take  this reasons to take this   feeding supplement Liqd Take 237 mLs by mouth 2 (two) times daily between meals.   furosemide 40 MG tablet Commonly known as: LASIX Take 1 tablet (40 mg total) by mouth daily. And extra 49m as needed if shortness of breath, worsening edema, increase in weight >5lbs in 3 days Start taking on: March 12, 2022   levothyroxine 25 MCG tablet Commonly known as: SYNTHROID Take 1 tablet (25 mcg total) by mouth daily before breakfast.   lidocaine 5 % Commonly known as: LIDODERM Place 1 patch onto the skin daily. Remove & Discard patch within 12 hours or as directed by MD What changed: how much to take   metoprolol succinate 25 MG 24 hr tablet Commonly known as: TOPROL-XL Take 0.5 tablets (12.5 mg total) by mouth daily. Start taking on: March 12, 2022   mirtazapine 7.5 MG tablet Commonly known as: REMERON Take 1 tablet (7.5 mg total) by mouth at bedtime.   MULTIVITAMIN PO Take 1 tablet by mouth daily.   nystatin 100000 UNIT/ML suspension Commonly known as: MYCOSTATIN Take 5 mLs (500,000 Units total) by mouth 4 (four) times daily for 7 days.   oxyCODONE 5 MG immediate release  tablet Commonly known as: Oxy IR/ROXICODONE Take 5 mg by mouth daily as needed for severe pain.   polyethylene glycol 17 g packet Commonly known as: MIRALAX / GLYCOLAX Take 17 g by mouth daily as needed for mild constipation or moderate constipation.   rosuvastatin 10 MG tablet Commonly known as: CRESTOR Take 1 tablet (10 mg total) by mouth at bedtime.   spironolactone 25 MG tablet Commonly known as: ALDACTONE Take 0.5 tablets (12.5 mg total) by mouth daily. Start taking on: March 12, 2022   VITAMIN D PO Take 1 tablet by mouth daily.               Durable Medical Equipment  (From admission, onward)           Start     Ordered   03/10/22 1156  For home use only DME Other see comment  Once       Comments: Lift Chair  Question:  Length of Need  Answer:   Lifetime   03/10/22 1155            Follow-up Information     [facility] Follow up.   Specialty: Home Health Services Why: Agency will call you to set up apt times Contact information: [place] [address] [phone]         [facility] Follow up.   Why: outpatient palliative services Contact information: [place] [id number] [place], [place]. Go on 03/17/2022.   Why: for hospital follow-up @2$ :30pm Contact information: [address], [address] [address] [phone]         Skeet Latch, MD. Schedule an appointment as soon as possible for a visit in 2 week(s).   Specialty: Cardiology Why: for hospital follow-up Contact information: [place] [address] [phone]                Discharge Exam: Filed Weights   03/09/22 0453 03/10/22 0605 03/11/22 0153  Weight: 44.9 kg 45 kg 47.6 kg   Feels a lot better today, no significant back pain or rib pain.  Daughter at the bedside.  Feels okay to go home today  BP (!) 114/55   Pulse 72   Temp 97.8 F (36.6 C) (Oral)   Resp 17   Wt 47.6 kg   SpO2 97%   BMI 20.49 kg/m    Physical Exam General: Alert and oriented x 3, NAD, frail and ill-appearing Cardiovascular: S1 S2 clear, RRR.  Respiratory: Mild crackles at the left base Gastrointestinal: Soft, nontender, nondistended, NBS Ext: no pedal edema bilaterally Neuro: no new deficits Psych: Normal affect    Condition at discharge: fair  The results of significant diagnostics from this hospitalization (including imaging, microbiology, ancillary and laboratory) are listed below for reference.   Imaging Studies: DG Ribs Unilateral Left  Result Date: 03/10/2022 CLINICAL DATA:  Complains of posterior left rib pain EXAM: LEFT RIBS - 2 VIEW COMPARISON:  CT chest 03/03/2022. FINDINGS: The  bones appear osteopenic. There are no signs acute displaced rib fracture. Previous stent graft repair of the descending thoracic aorta. Multiple thoracic compression deformities are again seen as noted on recent chest CT. IMPRESSION: 1. No acute findings.  No displaced rib fractures identified. 2. Osteopenia. 3. Multiple thoracic compression deformities. Electronically Signed   By: Kerby Moors M.D.   On: 03/10/2022 10:26   VAS [place]  LOWER EXTREMITY VENOUS (DVT)  Result Date: 03/05/2022  Lower Venous DVT Study Patient Name:  BROOKIE STANFILL  Date of Exam:   03/05/2022 Medical Rec #: [id number]   Accession #:    [id number] Date of Birth: 20-Mar-1935   Patient Gender: F Patient Age:   21 years Exam Location:  [facility] Procedure:      VAS [place] LOWER EXTREMITY VENOUS (DVT) Referring Phys: Barb Merino --------------------------------------------------------------------------------  Indications: Pain, SOB, and weakness.  Risk Factors: DVT hx RLE 04/01/14. Comparison Study: Previous bilateral 04/01/14 right (positive) left (negative) Performing Technologist: McKayla Maag  Examination Guidelines: A complete evaluation includes B-mode imaging, spectral Doppler, color Doppler, and power Doppler as needed of all accessible portions of each vessel. Bilateral testing is considered an integral part of a complete examination. Limited examinations for reoccurring indications may be performed as noted. The reflux portion of the exam is performed with the patient in reverse Trendelenburg.  +---------+---------------+---------+-----------+----------+--------------+ RIGHT    CompressibilityPhasicitySpontaneityPropertiesThrombus Aging +---------+---------------+---------+-----------+----------+--------------+ CFV      Full           Yes      Yes                                 +---------+---------------+---------+-----------+----------+--------------+ SFJ      Full                                                         +---------+---------------+---------+-----------+----------+--------------+ FV Prox  Full                                                        +---------+---------------+---------+-----------+----------+--------------+ FV Mid   Full                                                        +---------+---------------+---------+-----------+----------+--------------+ FV DistalFull                                                        +---------+---------------+---------+-----------+----------+--------------+ PFV      Full                                                        +---------+---------------+---------+-----------+----------+--------------+ POP      Full           Yes      Yes                                 +---------+---------------+---------+-----------+----------+--------------+ PTV      Full                                                        +---------+---------------+---------+-----------+----------+--------------+  PERO     Full                                                        +---------+---------------+---------+-----------+----------+--------------+   +---------+---------------+---------+-----------+----------+--------------+ LEFT     CompressibilityPhasicitySpontaneityPropertiesThrombus Aging +---------+---------------+---------+-----------+----------+--------------+ CFV      Full           Yes      Yes                                 +---------+---------------+---------+-----------+----------+--------------+ SFJ      Full                                                        +---------+---------------+---------+-----------+----------+--------------+ FV Prox  Full                                                        +---------+---------------+---------+-----------+----------+--------------+ FV Mid   Full                                                         +---------+---------------+---------+-----------+----------+--------------+ FV DistalFull                                                        +---------+---------------+---------+-----------+----------+--------------+ PFV      Full                                                        +---------+---------------+---------+-----------+----------+--------------+ POP      Full           Yes      Yes                                 +---------+---------------+---------+-----------+----------+--------------+ PTV      Full                                                        +---------+---------------+---------+-----------+----------+--------------+ PERO     Full                                                        +---------+---------------+---------+-----------+----------+--------------+  Summary: BILATERAL: - No evidence of deep vein thrombosis seen in the lower extremities, bilaterally. - No evidence of superficial venous thrombosis in the lower extremities, bilaterally. -No evidence of popliteal cyst, bilaterally.   *See table(s) above for measurements and observations. Electronically signed by Orlie Pollen on 03/05/2022 at 6:47:57 PM.    Final    ECHOCARDIOGRAM COMPLETE  Result Date: 03/04/2022    ECHOCARDIOGRAM REPORT   Patient Name:   AZELIE GUSTAVESON Date of Exam: 03/04/2022 Medical Rec #:  [id number]  Height:       60.0 in Accession #:    [id number] Weight:       97.0 lb Date of Birth:  12/16/1935  BSA:          1.372 m Patient Age:    87 years   BP:           157/75 mmHg Patient Gender: F          HR:           86 bpm. Exam Location:  Inpatient Procedure: 2D Echo, Cardiac Doppler and Color Doppler Indications:    I50.21 CHF  History:        Patient has prior history of Echocardiogram examinations, most                 recent 06/04/2021. CHF, COPD and History of thoracic aortic                 aneurysm history of stenting of aorta, Signs/Symptoms:Murmur;                  Risk Factors:Hypertension.  Sonographer:    Lenard Galloway [facility], RDCS Referring Phys: [id number] [facility]  Sonographer Comments: Image difficulty due to kyphoscoliosis and patient unable to lay flat IMPRESSIONS  1. Independently mobile echodensity 0.7 x 0.7 cm, which appears to be in the right ventricular outflow tract based on parasternal imaging. Image 17-19 best demonstrate, not well seen elsewhere. Consider TEE if clinically relevant.  2. The aortic valve was not well visualized. There is moderate calcification of the aortic valve. Aortic valve regurgitation is severe. Aortic valve sclerosis/calcification is present, without any evidence of aortic stenosis. Stroke volume index 34 ml/m2 and DVI 0.43.  3. Left ventricular ejection fraction, by estimation, is 30 to 35%. The left ventricle has moderately decreased function. The left ventricle demonstrates regional wall motion abnormalities (see scoring diagram/findings for description). There is mild left ventricular hypertrophy. Left ventricular diastolic parameters are indeterminate.  4. Right ventricular systolic function is normal. The right ventricular size is normal. Tricuspid regurgitation signal is inadequate for assessing PA pressure.  5. The mitral valve is grossly normal. Mild mitral valve regurgitation.  6. The inferior vena cava is normal in size with greater than 50% respiratory variability, suggesting right atrial pressure of 3 mmHg. Comparison(s): A prior study was performed on 06/04/21. Prior images reviewed side by side. Aortic valve regurgitation appears similar. LV function has decreased. FINDINGS  Left Ventricle: Left ventricular ejection fraction, by estimation, is 30 to 35%. The left ventricle has moderately decreased function. The left ventricle demonstrates regional wall motion abnormalities. The left ventricular internal cavity size was normal in size. There is mild left ventricular hypertrophy. Left ventricular diastolic parameters are  indeterminate. Right Ventricle: The right ventricular size is normal. Right vetricular wall thickness was not well visualized. Right ventricular systolic function is normal. Tricuspid regurgitation signal is inadequate for assessing PA pressure. Left Atrium: Left atrial size was normal  in size. Right Atrium: Right atrial size was normal in size. Pericardium: There is no evidence of pericardial effusion. Mitral Valve: The mitral valve is grossly normal. Mild mitral valve regurgitation. Tricuspid Valve: The tricuspid valve is not well visualized. Tricuspid valve regurgitation is trivial. Aortic Valve: The aortic valve was not well visualized. There is moderate calcification of the aortic valve. Aortic valve regurgitation is severe. Aortic regurgitation PHT measures 353 msec. Aortic valve sclerosis/calcification is present, without any evidence of aortic stenosis. Aortic valve mean gradient measures 6.0 mmHg. Aortic valve peak gradient measures 9.8 mmHg. Aortic valve area, by VTI measures 1.79 cm. Pulmonic Valve: The pulmonic valve was not well visualized. Pulmonic valve regurgitation is trivial. Aorta: The aortic root is normal in size and structure and the ascending aorta was not well visualized. Venous: The inferior vena cava is normal in size with greater than 50% respiratory variability, suggesting right atrial pressure of 3 mmHg. IAS/Shunts: The interatrial septum was not well visualized.  LEFT VENTRICLE PLAX 2D LVIDd:         5.20 cm   Diastology LVIDs:         4.40 cm   LV e' medial:   2.96 cm/s LV PW:         1.20 cm   LV E/e' medial: 5.1 LV IVS:        1.00 cm LVOT diam:     2.30 cm LV SV:         46 LV SV Index:   34 LVOT Area:     4.15 cm  RIGHT VENTRICLE RV Basal diam:  2.40 cm RV S prime:     23.40 cm/s TAPSE (M-mode): 2.7 cm LEFT ATRIUM             Index        RIGHT ATRIUM           Index LA diam:        2.70 cm 1.97 cm/m   RA Area:     19.00 cm LA Vol (A2C):   36.6 ml 26.67 ml/m  RA Volume:    48.60 ml  35.41 ml/m LA Vol (A4C):   16.1 ml 11.73 ml/m LA Biplane Vol: 24.6 ml 17.92 ml/m  AORTIC VALVE AV Area (Vmax):    1.99 cm AV Area (Vmean):   1.85 cm AV Area (VTI):     1.79 cm AV Vmax:           156.41 cm/s AV Vmean:          116.818 cm/s AV VTI:            0.259 m AV Peak Grad:      9.8 mmHg AV Mean Grad:      6.0 mmHg LVOT Vmax:         74.73 cm/s LVOT Vmean:        52.133 cm/s LVOT VTI:          0.111 m LVOT/AV VTI ratio: 0.43 AI PHT:            353 msec  AORTA Ao Root diam: 3.50 cm Ao Asc diam:  3.90 cm MV E velocity: 14.98 cm/s                            SHUNTS  Systemic VTI:  0.11 m                            Systemic Diam: 2.30 cm Cherlynn Kaiser MD Electronically signed by Cherlynn Kaiser MD Signature Date/Time: 03/04/2022/2:20:45 PM    Final    CT ABDOMEN PELVIS WO CONTRAST  Result Date: 03/03/2022 CLINICAL DATA:  Abdominal mass with decreased appetite. EXAM: CT ABDOMEN AND PELVIS WITHOUT CONTRAST TECHNIQUE: Multidetector CT imaging of the abdomen and pelvis was performed following the standard protocol without IV contrast. RADIATION DOSE REDUCTION: This exam was performed according to the departmental dose-optimization program which includes automated exposure control, adjustment of the mA and/or kV according to patient size and/or use of iterative reconstruction technique. COMPARISON:  CTA chest today, CTA chest, abdomen and pelvis 02/07/2022 FINDINGS: Lower chest: There is moderate pan chamber cardiomegaly, through three-vessel coronary artery calcifications and a small chronic pericardial effusion. Lung bases show scarring changes and posterior atelectasis without infiltrates. Hepatobiliary: Limited visualization due to artifact from overlying wires and the patient's arms in the field. No obvious focal liver abnormality. Gallbladder is contracted and poorly seen. No biliary dilatation. Pancreas: Limited visualization due to the patient's arms and overlying  wires causing streak artifact. No obvious abnormality. 7 mm lipoma was previously seen posteriorly in the pancreatic neck. Spleen: No obvious abnormality. Adrenals/Urinary Tract: No adrenal or renal masses seen. There is mild bilateral renal volume loss. There is contrast in the collecting systems. No hydronephrosis or ureterectasis is seen. There is contrast opacification of the unremarkable bladder. The wall and lumen are unremarkable. Stomach/Bowel: No dilatation or wall thickening is visible without contrast. An appendix is not seen. Uncomplicated sigmoid diverticulosis. Vascular/Lymphatic: No adenopathy is seen. There is extensive aortoiliac calcification. There is calcification in the visceral branch arteries. 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 Reproductive: Uterus and bilateral adnexa are unremarkable. Other: There is no free air, free hemorrhage or free fluid. Musculoskeletal: Osteopenia. Moderate L3 and 4 compression fractures have been treated with kyphoplasty in the interval. There is progressive compression fracture of the T8 vertebral body with about 30% overall height loss. Old left hip nailing. Degenerative change and dextro rotary scoliosis lumbar spine. IMPRESSION: 1. No acute noncontrast CT abnormality is seen in the abdomen or pelvis. 2. Cardiomegaly with small chronic pericardial effusion. 3. Aortic and coronary artery atherosclerosis. 4. Extensive aortoiliac calcifications and stable 4.4 x 4.5 cm fusiform distal infrarenal AAA. Possible that the aneurysm could be palpated as a mass because it is in between the abdominal wall and spine. Recommend follow-up every 6 months and vascular consultation. Reference: J Am Coll Radiol  [id number]. 5. 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 6. Osteopenia, scoliosis and degenerative change. 7. Progressive T8 compression fracture with about 30% height loss. 8. L3 and L4 moderate compression fractures have been treated with kyphoplasty in the interval. No increased height loss at either level. Aortic Atherosclerosis (ICD10-I70.0). Electronically Signed   By: Telford Nab M.D.   On: 03/03/2022 21:56   CT Angio Chest PE W and/or Wo Contrast  Result Date: 03/03/2022 CLINICAL DATA:  Pulmonary embolism suspected. High probability. Recent lumbar  surgery. EXAM: CT ANGIOGRAPHY CHEST WITH CONTRAST TECHNIQUE: Multidetector CT imaging of the chest was performed using the standard protocol during bolus administration of intravenous contrast. Multiplanar CT image reconstructions and MIPs were obtained to evaluate the vascular anatomy. RADIATION DOSE REDUCTION: This exam was performed according to the departmental dose-optimization program which includes automated exposure control, adjustment of the mA and/or kV according to patient size and/or use of iterative reconstruction technique. CONTRAST:  65m OMNIPAQUE IOHEXOL 350 MG/ML SOLN COMPARISON:  02/07/2022 FINDINGS: Cardiovascular: Chronic cardiomegaly. Coronary artery calcification. Aortic atherosclerotic calcification. Thoracic aortic stent without complicating feature. This is not opacified by contrast as this exam is targeted towards the pulmonary arteries. Pulmonary arterial opacification is good. No pulmonary emboli. Mediastinum/Nodes: No mass or adenopathy. Lungs/Pleura: Chronic upper lobe pulmonary scarring. Minimal dependent atelectasis. No pleural effusion. No sign of pneumonia or mass lesion. Upper Abdomen: Negative Musculoskeletal: Thoracic compression fractures at T5, T6 and T7 appear the same as they did on the prior exam. There has been progression of compression fractures at T8  and T10. Review of the MIP images confirms the above findings. IMPRESSION: 1. No pulmonary emboli. 2. Chronic cardiomegaly. Coronary artery calcification. Aortic stent without visible complicating feature. 3. Chronic upper lobe pulmonary scarring. 4. Thoracic compression fractures at T5, T6 and T7 appear the same as they did on the prior exam. There has been progression of compression fractures at T8 and T10. Aortic Atherosclerosis (ICD10-I70.0). Electronically Signed   By: MNelson ChimesM.D.   On: 03/03/2022 18:21   DG Chest 2 View  Result Date: 03/03/2022 CLINICAL DATA:  Shortness of breath. EXAM: CHEST - 2 VIEW COMPARISON:  Chest radiograph 06/03/2021 and CTA 02/07/2022 FINDINGS: The cardiac silhouette remains enlarged. A descending thoracic aortic stent graft is again noted. There is chronic interstitial coarsening. Increased, mild densities in the lung bases compared to the prior radiograph may reflect atelectasis and possibly trace pleural effusions. No pneumothorax is identified. Thoracolumbar scoliosis and multiple compression fractures are again noted. IMPRESSION: Increased bibasilar opacities which may reflect atelectasis and possible trace pleural effusions. Electronically Signed   By: ALogan BoresM.D.   On: 03/03/2022 13:16   IR KYPHO LUMBAR INC FX REDUCE BONE BX UNI/BIL CANNULATION INC/IMAGING  Result Date: 02/11/2022 INDICATION: Severe low back pain secondary to compression fractures at L3 and L4. EXAM: BALLOON KYPHOPLASTY AT L3 AND L4 COMPARISON:  Recent thoracic and lumbosacral MRI scan. MEDICATIONS: As antibiotic prophylaxis, Ancef 2 g IV was ordered pre-procedure and administered intravenously within 1 hour of incision. All current medications are in the EMR and have been reviewed as part of this encounter. ANESTHESIA/SEDATION: Moderate (conscious) sedation was employed during this procedure. A total of Versed 1 mg and 1 mg Dilaudid was administered intravenously by the radiology nurse.  Total intra-service moderate Sedation Time: 50 minutes. The patient's level of consciousness and vital signs were monitored continuously by radiology nursing throughout the procedure under my direct supervision. FLUOROSCOPY: Radiation Exposure Index (as provided by the fluoroscopic device): 21 minutes 10000000mGy Kerma COMPLICATIONS: None immediate. PROCEDURE: Following a full explanation of the procedure along with the potential associated complications, an informed witnessed consent was obtained. The patient was placed prone on the fluoroscopic table. The skin overlying the lumbar region was then prepped and draped in the usual sterile fashion. The right pedicle at L3, and the left pedicle at L4 were then infiltrated with 0.25% bupivacaine followed by the advancement of an 11-gauge Jamshidi needle through each pedicle into the posterior one-third  at L3 and L4. These were then exchanged for a Kyphon advanced osteo introducer system comprised of a working cannula and a Kyphon osteo drill at each level. This combination at both levels was then advanced over a Kyphon osteo bone pin until the tip of the Kyphon osteo drill was in the posterior third at L3 and L4. At this time, the bone pin was removed at L3 and L4. In a medial trajectory, the combination was advanced until the tip of the working cannula was inside the posterior one-third at L3 and L4. The osteo drill was removed and a core sample sent for pathologic analysis from both levels. Through the working cannulae, a Kyphon bone biopsy device was advanced to within 5 mm of the anterior aspect of L3 and L4. A core sample from this was also sent for pathologic analysis. Through the working cannulae, a Kyphon inflatable bone tamp 20 x 3 was advanced and positioned with the distal marker 5 mm from the anterior aspect of L3. Crossing of the midline was seen on the AP projection. At this time, the balloons were expanded using contrast via a Kyphon inflation syringe device  via microtubing. Inflations were continued until there was apposition with the superior endplates. At this time, methylmethacrylate mixture was reconstituted with Tobramycin in the Kyphon bone mixing device system. This was then loaded onto the Kyphon bone fillers. The balloons were deflated and removed followed by the instillation of 4-1/2 bone filler equivalents of methylmethacrylate mixture at each level with excellent filling in the AP and lateral projections. No extravasation was noted in the disk spaces or posteriorly into the spinal canal. No epidural venous contamination was seen. The working cannula and the bone fillers were then retrieved and removed. Hemostasis was achieved at the skin entry sites. Patient tolerated the procedure well. IMPRESSION: 1. Status post fluoroscopic-guided needle placement for deep core bone biopsy at L3 and L4. 2. Status post vertebral body augmentation using balloon kyphoplasty at L3 and L4 as described without event. If the patient has known osteoporosis, recommend treatment as clinically indicated. If the patient's bone density status is unknown, DEXA scan is recommended. Electronically Signed   By: Luanne Bras M.D.   On: 02/11/2022 07:54   IR KYPHO EA ADDL LEVEL THORACIC OR LUMBAR  Result Date: 02/11/2022 INDICATION: Severe low back pain secondary to compression fractures at L3 and L4. EXAM: BALLOON KYPHOPLASTY AT L3 AND L4 COMPARISON:  Recent thoracic and lumbosacral MRI scan. MEDICATIONS: As antibiotic prophylaxis, Ancef 2 g IV was ordered pre-procedure and administered intravenously within 1 hour of incision. All current medications are in the EMR and have been reviewed as part of this encounter. ANESTHESIA/SEDATION: Moderate (conscious) sedation was employed during this procedure. A total of Versed 1 mg and 1 mg Dilaudid was administered intravenously by the radiology nurse. Total intra-service moderate Sedation Time: 50 minutes. The patient's level of  consciousness and vital signs were monitored continuously by radiology nursing throughout the procedure under my direct supervision. FLUOROSCOPY: Radiation Exposure Index (as provided by the fluoroscopic device): 21 minutes 0000000 mGy Kerma COMPLICATIONS: None immediate. PROCEDURE: Following a full explanation of the procedure along with the potential associated complications, an informed witnessed consent was obtained. The patient was placed prone on the fluoroscopic table. The skin overlying the lumbar region was then prepped and draped in the usual sterile fashion. The right pedicle at L3, and the left pedicle at L4 were then infiltrated with 0.25% bupivacaine followed by the advancement of an  11-gauge Jamshidi needle through each pedicle into the posterior one-third at L3 and L4. These were then exchanged for a Kyphon advanced osteo introducer system comprised of a working cannula and a Kyphon osteo drill at each level. This combination at both levels was then advanced over a Kyphon osteo bone pin until the tip of the Kyphon osteo drill was in the posterior third at L3 and L4. At this time, the bone pin was removed at L3 and L4. In a medial trajectory, the combination was advanced until the tip of the working cannula was inside the posterior one-third at L3 and L4. The osteo drill was removed and a core sample sent for pathologic analysis from both levels. Through the working cannulae, a Kyphon bone biopsy device was advanced to within 5 mm of the anterior aspect of L3 and L4. A core sample from this was also sent for pathologic analysis. Through the working cannulae, a Kyphon inflatable bone tamp 20 x 3 was advanced and positioned with the distal marker 5 mm from the anterior aspect of L3. Crossing of the midline was seen on the AP projection. At this time, the balloons were expanded using contrast via a Kyphon inflation syringe device via microtubing. Inflations were continued until there was apposition with the  superior endplates. At this time, methylmethacrylate mixture was reconstituted with Tobramycin in the Kyphon bone mixing device system. This was then loaded onto the Kyphon bone fillers. The balloons were deflated and removed followed by the instillation of 4-1/2 bone filler equivalents of methylmethacrylate mixture at each level with excellent filling in the AP and lateral projections. No extravasation was noted in the disk spaces or posteriorly into the spinal canal. No epidural venous contamination was seen. The working cannula and the bone fillers were then retrieved and removed. Hemostasis was achieved at the skin entry sites. Patient tolerated the procedure well. IMPRESSION: 1. Status post fluoroscopic-guided needle placement for deep core bone biopsy at L3 and L4. 2. Status post vertebral body augmentation using balloon kyphoplasty at L3 and L4 as described without event. If the patient has known osteoporosis, recommend treatment as clinically indicated. If the patient's bone density status is unknown, DEXA scan is recommended. Electronically Signed   By: Luanne Bras M.D.   On: 02/11/2022 07:54    Microbiology: Results for orders placed or performed during the hospital encounter of 03/03/22  Resp panel by RT-PCR (RSV, Flu A&B, Covid) Anterior Nasal Swab     Status: None   Collection Time: 03/03/22  3:56 PM   Specimen: Anterior Nasal Swab  Result Value Ref Range Status   SARS Coronavirus 2 by RT PCR NEGATIVE NEGATIVE Final   Influenza A by PCR NEGATIVE NEGATIVE Final   Influenza B by PCR NEGATIVE NEGATIVE Final    Comment: (NOTE) The Xpert Xpress SARS-CoV-2/FLU/RSV plus assay is intended as an aid in the diagnosis of influenza from Nasopharyngeal swab specimens and should not be used as a sole basis for treatment. Nasal washings and aspirates are unacceptable for Xpert Xpress SARS-CoV-2/FLU/RSV testing.  Fact Sheet for Patients: EntrepreneurPulse.com.au  Fact Sheet  for Healthcare Providers: IncredibleEmployment.be  This test is not yet approved or cleared by the [place] FDA and has been authorized for detection and/or diagnosis of SARS-CoV-2 by FDA under an Emergency Use Authorization (EUA). This EUA will remain in effect (meaning this test can be used) for the duration of the COVID-19 declaration under Section 564(b)(1) of the Act, 21 U.S.C. section 360bbb-3(b)(1), unless the authorization  is terminated or revoked.     Resp Syncytial Virus by PCR NEGATIVE NEGATIVE Final    Comment: (NOTE) Fact Sheet for Patients: EntrepreneurPulse.com.au  Fact Sheet for Healthcare Providers: IncredibleEmployment.be  This test is not yet approved or cleared by the [place] FDA and has been authorized for detection and/or diagnosis of SARS-CoV-2 by FDA under an Emergency Use Authorization (EUA). This EUA will remain in effect (meaning this test can be used) for the duration of the COVID-19 declaration under Section 564(b)(1) of the Act, 21 U.S.C. section 360bbb-3(b)(1), unless the authorization is terminated or revoked.  Performed at [facility], [place] [address] [place], [place] [address]   Culture, blood (single) w Reflex to ID Panel     Status: None   Collection Time: 03/04/22  4:05 PM   Specimen: BLOOD  Result Value Ref Range Status   Specimen Description BLOOD SITE NOT SPECIFIED  Final   Special Requests   Final    BOTTLES DRAWN AEROBIC ONLY Blood Culture results may not be optimal due to an inadequate volume of blood received in culture bottles   Culture   Final    NO GROWTH 5 DAYS Performed at [facility], [place] [address] [place] [employer], [place] [address]    Report Status 03/09/2022 FINAL  Final    Labs: CBC: Recent Labs  Lab 03/05/22 0132 03/09/22 0044  WBC 6.1 6.0  NEUTROABS 3.9  --   HGB 13.8 13.6  HCT 41.5 41.9  MCV 93.5 96.3  PLT 162 [id number]   Basic Metabolic  Panel: Recent Labs  Lab 03/05/22 0132 03/06/22 0110 03/07/22 0044 03/08/22 0049 03/09/22 0044 03/10/22 0055 03/11/22 0039  NA 139   < > 135 135 133* 133* 136  K 4.2   < > 4.4 3.8 4.4 4.0 4.0  CL 99   < > 97* 94* 94* 91* 92*  CO2 28   < > 32 34* 27 33* 35*  GLUCOSE 122*   < > 115* 96 101* 108* 106*  BUN 14   < > 22 22 24* 21 21  CREATININE 0.69   < > 0.78 0.90 0.57 0.71 0.78  CALCIUM 9.1   < > 8.7* 8.6* 8.6* 8.8* 9.0  MG 1.8  --   --   --   --   --   --   PHOS 3.7  --   --   --   --   --   --    < > = values in this interval not displayed.   Liver Function Tests: No results for input(s): "AST", "ALT", "ALKPHOS", "BILITOT", "PROT", "ALBUMIN" in the last 168 hours. CBG: No results for input(s): "GLUCAP" in the last 168 hours.  Discharge time spent: greater than 30 minutes.  Signed: Estill Cotta, MD [facility] 03/11/2022

## 2022-03-11 NOTE — TOC Progression Note (Signed)
Transition of Care [facility]) - Progression Note    Patient Details  Name: Nyasha Woolf MRN: [id number] Date of Birth: March 18, 1935  [facility] of [facility]) CM/SW Contact  Zenon Mayo, RN Phone Number: 03/11/2022, 11:29 AM  Clinical Narrative:    NCM called [name] and left message for a return call. Trying to get PTAR ambulance set up.    Expected Discharge Plan: [place] Barriers to Discharge: Continued Medical Work up  Expected Discharge Plan and Services In-house Referral: NA Discharge Planning Services: CM Consult Post Acute Care Choice: [place] arrangements for the past 2 months: Single Family Home Expected Discharge Date: 03/11/22               DME Arranged: N/A         HH Arranged: RN, PT, Social Work, Nurse's Aide HH Agency: [place] Date [facility] Agency Contacted: 03/09/22 Time [place]: [phone] Representative spoke with at [place]: [place] Determinants of Health ([place]) Interventions SDOH Screenings   Food Insecurity: No Food Insecurity (02/08/2022)  Housing: Low Risk  (02/08/2022)  Transportation Needs: No Transportation Needs (02/08/2022)  Utilities: Not At Risk (02/08/2022)  Tobacco Use: Low Risk  (03/03/2022)    Readmission Risk Interventions     No data to display

## 2022-03-11 NOTE — Care Management Important Message (Signed)
Important Message  Patient Details  Name: Stephanie Collier MRN: [id number] Date of Birth: [birth year]   Medicare Important Message Given:  Yes     Shelda Altes 03/11/2022, 11:27 AM

## 2022-03-11 NOTE — TOC Transition Note (Addendum)
Transition of Care [facility]) - CM/SW Discharge Note   Patient Details  Name: Stephanie Collier MRN: [id number] Date of Birth: Dec 21, 1935  [facility]) CM/SW Contact:  Zenon Mayo, RN Phone Number: 03/11/2022, 11:34 AM   Clinical Narrative:    Patient is for dc today, NCM notified Tommi Rumps with [facility] and Sarah with [facility].  NCM gave [name] the order for the chair lift to take to medical supply store.  Patient had decided it would be best for her to go home by ambulance.  NCM left vm for [name] to return call so we can find out what time to set up ambulance transport.  [name] called back and states to set up PTAR for 3:30 because she gets off work at 3 pm. NCM set ptar up for 3:30.    Final next level of care: Home w Home Health Services Barriers to Discharge: Continued Medical Work up   Patient Goals and CMS Choice CMS Medicare.gov Compare Post Acute Care list provided to:: Patient Represenative (must comment) Choice offered to / list presented to : Adult Children  Discharge Placement                         Discharge Plan and Services Additional resources added to the After Visit Summary for   In-house Referral: NA Discharge Planning Services: CM Consult Post Acute Care Choice: Home Health          DME Arranged: N/A         HH Arranged: RN, PT, Social Work, Nurse's Aide HH Agency: [place] Date [facility] Agency Contacted: 03/09/22 Time [place]: [phone] Representative spoke with at [place]: [place] Determinants of Health ([place]) Interventions SDOH Screenings   Food Insecurity: No Food Insecurity (02/08/2022)  Housing: Low Risk  (02/08/2022)  Transportation Needs: No Transportation Needs (02/08/2022)  Utilities: Not At Risk (02/08/2022)  Tobacco Use: Low Risk  (03/03/2022)     Readmission Risk Interventions     No data to display

## 2022-03-11 NOTE — Progress Notes (Signed)
                                                     Palliative Care Progress Note, Assessment & Plan   Patient Name: Stephanie Collier       Date: 03/11/2022 DOB: Apr 27, 1935  Age: 87 y.o. MRN#: [id number] Attending Physician: Mendel Corning, MD Primary Care Physician: [facility] Admit Date: 03/03/2022  HPI: 87 y.o. female  with past medical history of essential hypertension, AAA, COPD, Raynaud's disease, scoliosis, and recent admission for intractable back pain due to multiple spinal fractures status post kyphoplasty admitted on 03/03/2022 with generalized weakness, loss of appetite, and shortness of breath.  Patient diagnosed with acute CHF.  Chest x-ray with cardiomegaly.  Also with elevated BNP.  She is being treated with IV Lasix.  Patient also found to have stable AAA and more progression of compression fractures of T8 and T10.  She was found with elevated TSH at 7.  PMT consulted to discuss goals of care.     Plan of care: After reviewing the patient's chart and assessing the patient at bedside, I spoke with patient's daughter [name] at bedside.  And is requesting further medication education for patient in regards to heart failure drugs.  However, patient is unwilling to participate in education at this time.  [name] shares she is going back to [place] and would like for someone to review patient's medications with her.  Given that patient is discharging today, I advised counseled with [facility] palliative liaison that patient is requesting further discussions of medications.  Patient's discharge summary is in place.   Goals clear. Plan for d/c now. No acute palliative needs at this time.   Physical Exam Constitutional:      General: She is not in acute distress.    Appearance: She is normal weight. She is not ill-appearing.  HENT:      Head: Normocephalic.  Eyes:     Pupils: Pupils are equal, round, and reactive to light.  Cardiovascular:     Rate and Rhythm: Normal rate.  Pulmonary:     Effort: Pulmonary effort is normal.  Abdominal:     Palpations: Abdomen is soft.  Skin:    General: Skin is warm and dry.  Neurological:     Mental Status: She is alert and oriented to person, place, and time. Mental status is at baseline.  Psychiatric:        Behavior: Behavior normal.        Thought Content: Thought content normal.             Total Time 25 minutes   Janeth Terry L. Ilsa Iha, FNP-BC Palliative Medicine Team Team Phone # [phone]

## 2022-03-11 NOTE — Progress Notes (Signed)
Occupational Therapy Treatment Patient Details Name: Stephanie Collier MRN: [id number] DOB: Jan 11, 1936 Today's Date: 03/11/2022   History of present illness 87 y.o. female admitted on 03/03/2022 with generalized weakness, loss of appetite, and shortness of breath.  Patient diagnosed with acute CHF.Scan reveal progression of compression fractures of T8 and T10.  PMH essential HTN, AAA, COPD, Raynaud's disease, scoliosis,02/10/22 kyphoplasty   OT comments  Pt progressing towards goals, continues to need min encouragement for mobility. Pt needing min A for UB/LB dressing, min guard for bed mobility,and min guard-min A for transfers with RW. VSS on RA throughout. Pt presenting with impairments listed below, will follow acutely. Updating d/c recommendation to [facility] as long as pt has 24/7 level of assistance needed at home.   Recommendations for follow up therapy are one component of a multi-disciplinary discharge planning process, led by the attending physician.  Recommendations may be updated based on patient status, additional functional criteria and insurance authorization.    Follow Up Recommendations  Home health OT (as long as pt has 24/7 level of assist needed at home)     Assistance Recommended at Discharge Intermittent Supervision/Assistance  Patient can return home with the following  Assist for transportation;Assistance with cooking/housework;A little help with walking and/or transfers;A little help with bathing/dressing/bathroom   Equipment Recommendations  Wheelchair (measurements OT);Wheelchair cushion (measurements OT)    Recommendations for Other Services PT consult    Precautions / Restrictions Precautions Precautions: Fall;Back Precaution Booklet Issued: No Precaution Comments: back precautions Restrictions Weight Bearing Restrictions: No       Mobility Bed Mobility Overal bed mobility: Needs Assistance Bed Mobility: Supine to Sit, Sit to Supine       Sit to supine: Min  guard Sit to sidelying: Min guard      Transfers Overall transfer level: Needs assistance Equipment used: Rolling walker (2 wheels) Transfers: Bed to chair/wheelchair/BSC, Sit to/from Stand Sit to Stand: Min guard, Min assist Stand pivot transfers: Min assist               Balance Overall balance assessment: Needs assistance Sitting-balance support: Feet supported Sitting balance-Leahy Scale: Good     Standing balance support: Bilateral upper extremity supported, Reliant on assistive device for balance, During functional activity Standing balance-Leahy Scale: Poor Standing balance comment: relaint on external support                           ADL either performed or assessed with clinical judgement   ADL Overall ADL's : Needs assistance/impaired                 Upper Body Dressing : Minimal assistance;Sitting Upper Body Dressing Details (indicate cue type and reason): donning gown on backside Lower Body Dressing: Minimal assistance Lower Body Dressing Details (indicate cue type and reason): donning shoes Toilet Transfer: Ambulation;Rolling walker (2 wheels);Min guard                  Extremity/Trunk Assessment Upper Extremity Assessment Upper Extremity Assessment: Generalized weakness   Lower Extremity Assessment Lower Extremity Assessment: Defer to PT evaluation        Vision       Perception Perception Perception: Not tested   Praxis Praxis Praxis: Not tested    Cognition Arousal/Alertness: Awake/alert Behavior During Therapy: Flat affect Overall Cognitive Status: Impaired/Different from baseline Area of Impairment: Following commands, Safety/judgement, Awareness  Following Commands: Follows one step commands with increased time Safety/Judgement: Decreased awareness of safety, Decreased awareness of deficits Awareness: Emergent   General Comments: more agreeable to mobilize this session with  encouragement from daughter (in room)        Exercises      Shoulder Instructions       General Comments VSS on RA    Pertinent Vitals/ Pain       Pain Assessment Pain Assessment: Faces Pain Score: 3  Faces Pain Scale: Hurts little more Pain Location: L hip Pain Descriptors / Indicators: Discomfort Pain Intervention(s): Limited activity within patient's tolerance, Monitored during session  Home Living                                          Prior Functioning/Environment              Frequency  Min 2X/week        Progress Toward Goals  OT Goals(current goals can now be found in the care plan section)  Progress towards OT goals: Progressing toward goals  Acute Rehab OT Goals Patient Stated Goal: to go home OT Goal Formulation: With patient Time For Goal Achievement: 03/19/22 Potential to Achieve Goals: Good ADL Goals Pt Will Perform Upper Body Bathing: with set-up;sitting Pt Will Transfer to Toilet: with supervision;ambulating;bedside commode Additional ADL Goal #1: pt will complete bed mobility supervision level as precursor to adls  Plan Frequency remains appropriate;Discharge plan needs to be updated    Co-evaluation                 AM-PAC OT "6 Clicks" Daily Activity     Outcome Measure   Help from another person eating meals?: None Help from another person taking care of personal grooming?: A Little Help from another person toileting, which includes using toliet, bedpan, or urinal?: A Little Help from another person bathing (including washing, rinsing, drying)?: A Lot Help from another person to put on and taking off regular upper body clothing?: A Little Help from another person to put on and taking off regular lower body clothing?: A Lot 6 Click Score: 17    End of Session Equipment Utilized During Treatment: Gait belt;Rolling walker (2 wheels)  OT Visit Diagnosis: Unsteadiness on feet (R26.81)   Activity  Tolerance Patient limited by pain   Patient Left in bed;with call bell/phone within reach;with bed alarm set;with family/visitor present   Nurse Communication Mobility status;Precautions        Time: [id number] OT Time Calculation (min): 17 min  Charges: OT General Charges $OT Visit: 1 Visit OT Treatments $Self Care/Home Management : 8-22 mins  Renaye Rakers, OTD, OTR/L SecureChat Preferred Acute Rehab [phone] - [place] 03/11/2022, 12:04 PM

## 2022-03-11 NOTE — TOC Progression Note (Addendum)
Transition of Care [facility]) - Progression Note    Patient Details  Name: Stephanie Collier MRN: [id number] Date of Birth: 1935/07/15  Transition of Care [facility]) CM/SW Contact  Zenon Mayo, RN Phone Number: 03/11/2022, 10:27 AM  Clinical Narrative:    Per [name] the patient, daughter she would like for it to be noted on chart that to contact [name] first, Bari Edward and Jonelle Sidle first.    Expected Discharge Plan: [place] Barriers to Discharge: Continued Medical Work up  Expected Discharge Plan and Services In-house Referral: NA Discharge Planning Services: CM Consult Post Acute Care Choice: [place] arrangements for the past 2 months: Single Family Home                 DME Arranged: N/A         HH Arranged: RN, PT, Social Work, Nurse's Aide [place] Agency: [place] Date [facility] Agency Contacted: 03/09/22 Time [place]: [phone] Representative spoke with at [place]: [place] ([place]) Interventions SDOH Screenings   Food Insecurity: No Food Insecurity (02/08/2022)  Housing: Low Risk  (02/08/2022)  Transportation Needs: No Transportation Needs (02/08/2022)  Utilities: Not At Risk (02/08/2022)  Tobacco Use: Low Risk  (03/03/2022)    Readmission Risk Interventions     No data to display

## 2022-03-11 NOTE — Progress Notes (Signed)
Rounding Note    Patient Name: Stephanie Collier Date of Encounter: 03/11/2022  [place] Cardiologist: Skeet Latch, MD   Subjective   Feeling well.  Breathing is improving.  Short of breath after walking to bathroom.  Inpatient Medications    Scheduled Meds:  enoxaparin (LOVENOX) injection  30 mg Subcutaneous Q24H   furosemide  60 mg Oral Daily   levothyroxine  25 mcg Oral Q0600   lidocaine  1 patch Transdermal Q24H   metoprolol succinate  12.5 mg Oral Daily   nystatin  5 mL Oral QID   polyethylene glycol  17 g Oral Daily   rosuvastatin  10 mg Oral Daily   senna  2 tablet Oral QHS   spironolactone  12.5 mg Oral Daily   Continuous Infusions:  PRN Meds: ALPRAZolam, hydrALAZINE, ipratropium-albuterol, naLOXone (NARCAN)  injection, mouth rinse, oxyCODONE, sodium phosphate   Vital Signs    Vitals:   03/10/22 1945 03/11/22 0153 03/11/22 0442 03/11/22 0737  BP: (!) 120/57 (!) 118/53 135/60 (!) 114/55  Pulse: 70 72 75 72  Resp: '19 19 18 17  '$ Temp: 98 F (36.7 C) 97.6 F (36.4 C) 97.6 F (36.4 C) 97.8 F (36.6 C)  TempSrc: Oral Oral Oral Oral  SpO2: 95%  95% 97%  Weight:  47.6 kg      Intake/Output Summary (Last 24 hours) at 03/11/2022 0927 Last data filed at 03/11/2022 0830 Gross per 24 hour  Intake 480 ml  Output 800 ml  Net -320 ml      03/11/2022    1:53 AM 03/10/2022    6:05 AM 03/09/2022    4:53 AM  Last 3 Weights  Weight (lbs) 104 lb 15 oz 99 lb 3.3 oz 98 lb 15.8 oz  Weight (kg) 47.6 kg 45 kg 44.9 kg      Telemetry    Sinus rhythm.  3 beats NSVT.  13 beats of SVT.- Personally Reviewed  ECG    N/a - Personally Reviewed  Physical Exam   VS:  BP (!) 114/55   Pulse 72   Temp 97.8 F (36.6 C) (Oral)   Resp 17   Wt 47.6 kg   SpO2 97%   BMI 20.49 kg/m  , BMI Body mass index is 20.49 kg/m. GENERAL: Frail.  Mild respiratory distress HEENT: Pupils equal round and reactive, fundi not visualized, oral mucosa unremarkable NECK:  no  jugular venous distention, waveform within normal limits, carotid upstroke brisk and symmetric, no bruits, no thyromegaly LUNGS: Crackles at L base.  No rhonchi, or wheezes HEART:  RRR.  PMI not displaced or sustained,S1 and S2 within normal limits, no S3, no S4, no clicks, no rubs, no murmurs ABD:  Flat, positive bowel sounds normal in frequency in pitch, no bruits, no rebound, no guarding, no midline pulsatile mass, no hepatomegaly, no splenomegaly EXT:  2 plus pulses throughout, no edema, no cyanosis no clubbing SKIN:  No rashes no nodules NEURO:  Cranial nerves II through XII grossly intact, motor grossly intact throughout PSYCH:  Cognitively intact, oriented to person place and time   Labs    High Sensitivity Troponin:   Recent Labs  Lab 03/03/22 1651 03/04/22 0036 03/04/22 0219 03/04/22 0337 03/04/22 0556  TROPONINIHS 54* 63* 65* 64* 59*     Chemistry Recent Labs  Lab 03/05/22 0132 03/06/22 0110 03/09/22 0044 03/10/22 0055 03/11/22 0039  NA 139   < > 133* 133* 136  K 4.2   < > 4.4 4.0 4.0  CL 99   < > 94* 91* 92*  CO2 28   < > 27 33* 35*  GLUCOSE 122*   < > 101* 108* 106*  BUN 14   < > 24* 21 21  CREATININE 0.69   < > 0.57 0.71 0.78  CALCIUM 9.1   < > 8.6* 8.8* 9.0  MG 1.8  --   --   --   --   GFRNONAA >60   < > >60 >60 >60  ANIONGAP 12   < > '12 9 9   '$ < > = values in this interval not displayed.    Lipids  Recent Labs  Lab 03/09/22 0044  CHOL 179  TRIG 138  HDL 40*  LDLCALC 111*  CHOLHDL 4.5    Hematology Recent Labs  Lab 03/05/22 0132 03/09/22 0044  WBC 6.1 6.0  RBC 4.44 4.35  HGB 13.8 13.6  HCT 41.5 41.9  MCV 93.5 96.3  MCH 31.1 31.3  MCHC 33.3 32.5  RDW 14.6 14.4  PLT 162 123*   Thyroid  No results for input(s): "TSH", "FREET4" in the last 168 hours.   BNP Recent Labs  Lab 03/09/22 0539  BNP 419.2*    DDimer No results for input(s): "DDIMER" in the last 168 hours.   Radiology    DG Ribs Unilateral Left  Result Date:  03/10/2022 CLINICAL DATA:  Complains of posterior left rib pain EXAM: LEFT RIBS - 2 VIEW COMPARISON:  CT chest 03/03/2022. FINDINGS: The bones appear osteopenic. There are no signs acute displaced rib fracture. Previous stent graft repair of the descending thoracic aorta. Multiple thoracic compression deformities are again seen as noted on recent chest CT. IMPRESSION: 1. No acute findings.  No displaced rib fractures identified. 2. Osteopenia. 3. Multiple thoracic compression deformities. Electronically Signed   By: Kerby Moors M.D.   On: 03/10/2022 10:26    Cardiac Studies  Echo 03/04/22:  1. Independently mobile echodensity 0.7 x 0.7 cm, which appears to be in  the right ventricular outflow tract based on parasternal imaging. Image  17-19 best demonstrate, not well seen elsewhere. Consider TEE if  clinically relevant.   2. The aortic valve was not well visualized. There is moderate  calcification of the aortic valve. Aortic valve regurgitation is severe.  Aortic valve sclerosis/calcification is present, without any evidence of  aortic stenosis. Stroke volume index 34  ml/m2 and DVI 0.43.   3. Left ventricular ejection fraction, by estimation, is 30 to 35%. The  left ventricle has moderately decreased function. The left ventricle  demonstrates regional wall motion abnormalities (see scoring  diagram/findings for description). There is mild  left ventricular hypertrophy. Left ventricular diastolic parameters are  indeterminate.   4. Right ventricular systolic function is normal. The right ventricular  size is normal. Tricuspid regurgitation signal is inadequate for assessing  PA pressure.   5. The mitral valve is grossly normal. Mild mitral valve regurgitation.   6. The inferior vena cava is normal in size with greater than 50%  respiratory variability, suggesting right atrial pressure of 3 mmHg.   Patient Profile     87 y.o. female with chronic systolic and diastolic heart failure  (LVEF 30-35%), severe aortic regurgitation, mild ascending aortic aneurysm, 4.5 cm infrarenal AAA, thoracic aorta and treating ulcer, hypertension admitted failure to thrive and we diagnosed systolic heart failure.  Assessment & Plan    # Acute systolic heart failure: LVEF 30-35%.  Newly diagnosed this admission.  Global hypokinesis.  High-sensitivity troponin minimally elevated and flat.  She has no chest pain.  Not thought to be due to ACS.  She has no chest pain.  Given her overall prognosis and lack of symptoms, no plan for invasive workup.  Continue metoprolol succinate.  Avoid bradycardia given her aortic regurgitation.  Blood pressure does not tolerate further titration of GDMT.  Continue spironolactone.  She was started on oral lasix and had a negative fluid balance.  We discussed the importance of weighing daily.  If her weight goes up by 2lb in a day, increased SOB or 5lb ina week, OK to take extra dose of lasix.  Renal function stable.  Will add [place].    # Coronary calcification:  # Hyperlipidemia: Noted on CT this admission.  Initially there was discussion about left and right heart cath.  However, after discussion with the weekend team, family and patient have elected for more palliative approach given her overall prognosis and lack of ischemic symptoms.  LDL is 111.  Started rosuvastatin '10mg'$ .  Check lipids/CMP In 2-3 months.   # Severe AR:  Avoid bradycardia.  Reduced metoprolol as above.  She is not an operative candidate.  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  Not a candidate for anticoagulation due to intramural hematoma.  She follows with vascular and was supposed to have repeat scan and follow-up 05/2022.  She was scanned this hospitalization and found to have extensive  aortoiliac calcification and a stable 4.4X 4.5 cm fusiform distal infrarenal AAA.  They recommend 53-monthscans and continued vascular follow-up.  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  Clinically no evidence of endocarditis.  No further evaluation recommended at this time.   [place] sign off.   Medication Recommendations:  lasix '40mg'$  daily and extra as needed.  Add FWilder Glade(or JGhanaif insurance prefers) Other recommendations (labs, testing, etc):  lipids/CMP in 2-3 months.  Weigh daily.  Follow up as an outpatient:  we will arrange     For questions or updates, please contact CLongviewPlease consult [email or web address] for contact info under        Signed, TSkeet Latch MD  03/11/2022, 9:27 AM

## 2022-03-15 ENCOUNTER — Telehealth (HOSPITAL_BASED_OUTPATIENT_CLINIC_OR_DEPARTMENT_OTHER): Payer: Self-pay | Admitting: Family

## 2022-03-15 DIAGNOSIS — I2510 Atherosclerotic heart disease of native coronary artery without angina pectoris: Secondary | ICD-10-CM | POA: Diagnosis not present

## 2022-03-15 DIAGNOSIS — I7300 Raynaud's syndrome without gangrene: Secondary | ICD-10-CM | POA: Diagnosis not present

## 2022-03-15 DIAGNOSIS — Z681 Body mass index (BMI) 19 or less, adult: Secondary | ICD-10-CM | POA: Diagnosis not present

## 2022-03-15 DIAGNOSIS — I714 Abdominal aortic aneurysm, without rupture, unspecified: Secondary | ICD-10-CM | POA: Diagnosis not present

## 2022-03-15 DIAGNOSIS — J449 Chronic obstructive pulmonary disease, unspecified: Secondary | ICD-10-CM | POA: Diagnosis not present

## 2022-03-15 DIAGNOSIS — I700 Atherosclerosis of aorta: Secondary | ICD-10-CM | POA: Diagnosis not present

## 2022-03-15 DIAGNOSIS — M4854XD Collapsed vertebra, not elsewhere classified, thoracic region, subsequent encounter for fracture with routine healing: Secondary | ICD-10-CM | POA: Diagnosis not present

## 2022-03-15 DIAGNOSIS — M419 Scoliosis, unspecified: Secondary | ICD-10-CM | POA: Diagnosis not present

## 2022-03-15 DIAGNOSIS — Z9181 History of falling: Secondary | ICD-10-CM | POA: Diagnosis not present

## 2022-03-15 DIAGNOSIS — I429 Cardiomyopathy, unspecified: Secondary | ICD-10-CM | POA: Diagnosis not present

## 2022-03-15 DIAGNOSIS — E039 Hypothyroidism, unspecified: Secondary | ICD-10-CM | POA: Diagnosis not present

## 2022-03-15 DIAGNOSIS — G4700 Insomnia, unspecified: Secondary | ICD-10-CM | POA: Diagnosis not present

## 2022-03-15 DIAGNOSIS — M47819 Spondylosis without myelopathy or radiculopathy, site unspecified: Secondary | ICD-10-CM | POA: Diagnosis not present

## 2022-03-15 DIAGNOSIS — Z955 Presence of coronary angioplasty implant and graft: Secondary | ICD-10-CM | POA: Diagnosis not present

## 2022-03-15 DIAGNOSIS — B370 Candidal stomatitis: Secondary | ICD-10-CM | POA: Diagnosis not present

## 2022-03-15 DIAGNOSIS — I5041 Acute combined systolic (congestive) and diastolic (congestive) heart failure: Secondary | ICD-10-CM | POA: Diagnosis not present

## 2022-03-15 DIAGNOSIS — I351 Nonrheumatic aortic (valve) insufficiency: Secondary | ICD-10-CM | POA: Diagnosis not present

## 2022-03-15 DIAGNOSIS — G8929 Other chronic pain: Secondary | ICD-10-CM | POA: Diagnosis not present

## 2022-03-15 DIAGNOSIS — M8580 Other specified disorders of bone density and structure, unspecified site: Secondary | ICD-10-CM | POA: Diagnosis not present

## 2022-03-15 DIAGNOSIS — I110 Hypertensive heart disease with heart failure: Secondary | ICD-10-CM | POA: Diagnosis not present

## 2022-03-15 DIAGNOSIS — R1310 Dysphagia, unspecified: Secondary | ICD-10-CM | POA: Diagnosis not present

## 2022-03-15 DIAGNOSIS — R0781 Pleurodynia: Secondary | ICD-10-CM | POA: Diagnosis not present

## 2022-03-15 DIAGNOSIS — F419 Anxiety disorder, unspecified: Secondary | ICD-10-CM | POA: Diagnosis not present

## 2022-03-15 NOTE — Telephone Encounter (Signed)
Spoke with patient regarding the 03/19/22 2:20 pm appt with Braxton Feathers is not in the office----patient needs a Friday afternoon---rescheduled to 04/09/22 at 2:20 pm.  WIll mail information to patient and she voiced her understanding.  Will mail information to patient ;;

## 2022-03-17 DIAGNOSIS — S72145S Nondisplaced intertrochanteric fracture of left femur, sequela: Secondary | ICD-10-CM | POA: Diagnosis not present

## 2022-03-17 DIAGNOSIS — J9611 Chronic respiratory failure with hypoxia: Secondary | ICD-10-CM | POA: Diagnosis not present

## 2022-03-17 DIAGNOSIS — I509 Heart failure, unspecified: Secondary | ICD-10-CM | POA: Diagnosis not present

## 2022-03-17 DIAGNOSIS — J9601 Acute respiratory failure with hypoxia: Secondary | ICD-10-CM | POA: Diagnosis not present

## 2022-03-17 DIAGNOSIS — R1310 Dysphagia, unspecified: Secondary | ICD-10-CM | POA: Diagnosis not present

## 2022-03-19 ENCOUNTER — Ambulatory Visit (HOSPITAL_BASED_OUTPATIENT_CLINIC_OR_DEPARTMENT_OTHER): Payer: Medicare Other | Admitting: Family

## 2022-03-19 DIAGNOSIS — I719 Aortic aneurysm of unspecified site, without rupture: Secondary | ICD-10-CM | POA: Diagnosis not present

## 2022-03-19 DIAGNOSIS — M8088XD Other osteoporosis with current pathological fracture, vertebra(e), subsequent encounter for fracture with routine healing: Secondary | ICD-10-CM | POA: Diagnosis not present

## 2022-03-19 DIAGNOSIS — E46 Unspecified protein-calorie malnutrition: Secondary | ICD-10-CM | POA: Diagnosis not present

## 2022-03-19 DIAGNOSIS — I509 Heart failure, unspecified: Secondary | ICD-10-CM | POA: Diagnosis not present

## 2022-03-23 ENCOUNTER — Telehealth: Payer: Self-pay

## 2022-03-23 NOTE — Telephone Encounter (Signed)
(  2:27 pm) PC SW left a voice message for patient and her daughter-[name] requesting a call back to discuss the palliative care referral received from the doctor.

## 2022-03-24 ENCOUNTER — Other Ambulatory Visit: Payer: Self-pay

## 2022-03-24 ENCOUNTER — Telehealth: Payer: Self-pay

## 2022-03-24 DIAGNOSIS — I712 Thoracic aortic aneurysm, without rupture, unspecified: Secondary | ICD-10-CM

## 2022-03-24 NOTE — Telephone Encounter (Signed)
(  3:27 pm) PC SW returned call to patient's daughter-Dana. SW provided education to [place] regarding the palliative program, which she was open to. She advised that her mother has not been compliant with other services in the past. She feels her mother could benefit from the program. She stated that she will call her mother to endorse the program and services and asked SW to follow-up with patient in a few minutes.  (3:50 pm) PC SW called patient and received her voicemail. SW left a message for patient requesting a call back to discuss services.

## 2022-03-24 NOTE — Telephone Encounter (Signed)
Pt called to let see when she should return for f/u surveillance, as she was recently d/c from hospital and CT was done of her AAA. Per MD, pt can return for f/u in 6 months with AAA u/s. Pt has been scheduled and is aware of this appt.

## 2022-03-25 ENCOUNTER — Telehealth: Payer: Self-pay

## 2022-03-25 ENCOUNTER — Ambulatory Visit (HOSPITAL_BASED_OUTPATIENT_CLINIC_OR_DEPARTMENT_OTHER): Payer: Medicare Other | Admitting: Family

## 2022-03-25 ENCOUNTER — Telehealth: Payer: Self-pay | Admitting: Family

## 2022-03-25 DIAGNOSIS — I351 Nonrheumatic aortic (valve) insufficiency: Secondary | ICD-10-CM | POA: Diagnosis not present

## 2022-03-25 DIAGNOSIS — R0781 Pleurodynia: Secondary | ICD-10-CM | POA: Diagnosis not present

## 2022-03-25 DIAGNOSIS — M47819 Spondylosis without myelopathy or radiculopathy, site unspecified: Secondary | ICD-10-CM | POA: Diagnosis not present

## 2022-03-25 DIAGNOSIS — I110 Hypertensive heart disease with heart failure: Secondary | ICD-10-CM | POA: Diagnosis not present

## 2022-03-25 DIAGNOSIS — I700 Atherosclerosis of aorta: Secondary | ICD-10-CM | POA: Diagnosis not present

## 2022-03-25 DIAGNOSIS — M4854XD Collapsed vertebra, not elsewhere classified, thoracic region, subsequent encounter for fracture with routine healing: Secondary | ICD-10-CM | POA: Diagnosis not present

## 2022-03-25 DIAGNOSIS — I2510 Atherosclerotic heart disease of native coronary artery without angina pectoris: Secondary | ICD-10-CM | POA: Diagnosis not present

## 2022-03-25 DIAGNOSIS — I429 Cardiomyopathy, unspecified: Secondary | ICD-10-CM | POA: Diagnosis not present

## 2022-03-25 DIAGNOSIS — I7300 Raynaud's syndrome without gangrene: Secondary | ICD-10-CM | POA: Diagnosis not present

## 2022-03-25 DIAGNOSIS — I714 Abdominal aortic aneurysm, without rupture, unspecified: Secondary | ICD-10-CM | POA: Diagnosis not present

## 2022-03-25 DIAGNOSIS — I5041 Acute combined systolic (congestive) and diastolic (congestive) heart failure: Secondary | ICD-10-CM | POA: Diagnosis not present

## 2022-03-25 DIAGNOSIS — M419 Scoliosis, unspecified: Secondary | ICD-10-CM | POA: Diagnosis not present

## 2022-03-25 DIAGNOSIS — M8580 Other specified disorders of bone density and structure, unspecified site: Secondary | ICD-10-CM | POA: Diagnosis not present

## 2022-03-25 DIAGNOSIS — G8929 Other chronic pain: Secondary | ICD-10-CM | POA: Diagnosis not present

## 2022-03-25 DIAGNOSIS — E039 Hypothyroidism, unspecified: Secondary | ICD-10-CM | POA: Diagnosis not present

## 2022-03-25 DIAGNOSIS — J449 Chronic obstructive pulmonary disease, unspecified: Secondary | ICD-10-CM | POA: Diagnosis not present

## 2022-03-25 NOTE — Telephone Encounter (Signed)
Chart reviewed. Seen in hospital by Dr. Oval Linsey with new onset acute systolic heart failure. She was started on GDMT for heart failure, HLD, coronary calcification.   May stop Rosuvastatin. Start Pravastatin '20mg'$  daily.   Mirtazapine refills will need addressed by PCP.   Wilder Glade presently prescribed for heart failure benefit. Called [facility] - she does not have Medicare part D - she did pick up 30 day supply for $631. Will need to contact [facility] ([place]) to see if they can help her with cost assistance. Information listed below. We can provide 2 weeks of samples as well as patient assistance paperwork to fill out. But need to ensure we find a sustainable way to get the [place]. CCing [place], [place] in case I missed a resource.  [facility] of [place] [address] [phone] ext. [phone]  Would likely be beneficial to discuss with daughter. Needs to fill out DPR at clinic visit. Can ask her on phone if okay if we call her daughter to relay information.  Loel Dubonnet, NP

## 2022-03-25 NOTE — Telephone Encounter (Signed)
(  2: 24 pm) PC SW completed a follow-up call to patient to discuss palliative care services and set up a visit. Patient stated she did not want palliative care services at this time. She states she has other services in place and declined palliative care services. SW reinforced her contact information and encouraged her to call back if/when services are needed.  Patient will be moved to inactive list.

## 2022-03-25 NOTE — Telephone Encounter (Signed)
Call attempted, no answer, unable to leave message due to no DPR on file!     "Chart reviewed. Seen in hospital by Dr. Oval Linsey with new onset acute systolic heart failure. She was started on GDMT for heart failure, HLD, coronary calcification.    May stop Rosuvastatin. Start Pravastatin '20mg'$  daily.    Mirtazapine refills will need addressed by PCP.    Wilder Glade presently prescribed for heart failure benefit. Called [facility] - she does not have Medicare part D - she did pick up 30 day supply for $631. Will need to contact [facility] ([place]) to see if they can help her with cost assistance. Information listed below. We can provide 2 weeks of samples as well as patient assistance paperwork to fill out. But need to ensure we find a sustainable way to get the [place]. CCing [place], [place] in case I missed a resource.   [facility] of [place] [address] [phone] ext. [phone]   Would likely be beneficial to discuss with daughter. Needs to fill out DPR at clinic visit. Can ask her on phone if okay if we call her daughter to relay information.   Loel Dubonnet, NP

## 2022-03-25 NOTE — Telephone Encounter (Signed)
Pt. Returned call, transferred from call center,   Patient states there are two medications that she is not taking, rosuvastatin and mirtazapine, she states she has been taking them on and off for the last two weeks. She states the mirtazapine messed with her head- she states she cannot remember what it did to her, but she knows it was awful. She said she is not having any anxiety or depression and so she does not need it.   The rosuvastatin she states gave her horrible stomach pain, indigestion, and insomnia. She does not want to take this. She states that if she needs to go back onto a new medication for her cholesterol to only send in 10 pills to see if it works before she spends money on a whole prescription.   Patient states she will not be paying the crazy price for farxiga and states that someone is supposed to be working on something with this for her. Informed patient that medication not prescribed by cardiology and she would need to contact her primary care office.

## 2022-03-25 NOTE — Telephone Encounter (Signed)
Patient is calling to talk with Dr. Gilford Rile or Dr. Oval Linsey in regards to her medication

## 2022-03-25 NOTE — Addendum Note (Signed)
Addended by: Gerald Stabs on: 03/25/2022 04:55 PM   Modules accepted: Orders

## 2022-03-25 NOTE — Telephone Encounter (Signed)
Returned call to patient, no answer, unable to leave message, no DPR on file

## 2022-03-26 ENCOUNTER — Other Ambulatory Visit (HOSPITAL_COMMUNITY): Payer: Self-pay

## 2022-03-26 DIAGNOSIS — J449 Chronic obstructive pulmonary disease, unspecified: Secondary | ICD-10-CM | POA: Diagnosis not present

## 2022-03-26 DIAGNOSIS — I5041 Acute combined systolic (congestive) and diastolic (congestive) heart failure: Secondary | ICD-10-CM | POA: Diagnosis not present

## 2022-03-26 DIAGNOSIS — I2510 Atherosclerotic heart disease of native coronary artery without angina pectoris: Secondary | ICD-10-CM | POA: Diagnosis not present

## 2022-03-26 DIAGNOSIS — I110 Hypertensive heart disease with heart failure: Secondary | ICD-10-CM | POA: Diagnosis not present

## 2022-03-26 DIAGNOSIS — I429 Cardiomyopathy, unspecified: Secondary | ICD-10-CM | POA: Diagnosis not present

## 2022-03-26 DIAGNOSIS — M419 Scoliosis, unspecified: Secondary | ICD-10-CM | POA: Diagnosis not present

## 2022-03-26 DIAGNOSIS — I7300 Raynaud's syndrome without gangrene: Secondary | ICD-10-CM | POA: Diagnosis not present

## 2022-03-26 DIAGNOSIS — R0781 Pleurodynia: Secondary | ICD-10-CM | POA: Diagnosis not present

## 2022-03-26 DIAGNOSIS — M4854XD Collapsed vertebra, not elsewhere classified, thoracic region, subsequent encounter for fracture with routine healing: Secondary | ICD-10-CM | POA: Diagnosis not present

## 2022-03-26 DIAGNOSIS — I700 Atherosclerosis of aorta: Secondary | ICD-10-CM | POA: Diagnosis not present

## 2022-03-26 DIAGNOSIS — G8929 Other chronic pain: Secondary | ICD-10-CM | POA: Diagnosis not present

## 2022-03-26 DIAGNOSIS — I351 Nonrheumatic aortic (valve) insufficiency: Secondary | ICD-10-CM | POA: Diagnosis not present

## 2022-03-26 DIAGNOSIS — I714 Abdominal aortic aneurysm, without rupture, unspecified: Secondary | ICD-10-CM | POA: Diagnosis not present

## 2022-03-26 DIAGNOSIS — E039 Hypothyroidism, unspecified: Secondary | ICD-10-CM | POA: Diagnosis not present

## 2022-03-26 DIAGNOSIS — M8580 Other specified disorders of bone density and structure, unspecified site: Secondary | ICD-10-CM | POA: Diagnosis not present

## 2022-03-26 DIAGNOSIS — M47819 Spondylosis without myelopathy or radiculopathy, site unspecified: Secondary | ICD-10-CM | POA: Diagnosis not present

## 2022-03-26 MED ORDER — PRAVASTATIN SODIUM 20 MG PO TABS: 20.0000 mg | ORAL_TABLET | Freq: Every evening | ORAL | 0 refills | Status: DC

## 2022-03-26 NOTE — Telephone Encounter (Signed)
Returned call to patient and asked if her daughter could be present for the call due to amount of information. Patient verbalizes understanding of new cholesterol medication and asks that a 10 day supply be sent to the pharmacy to try out. She verbalizes understanding that she will need to call PCP in regards to mirtazapine issues. Patient states she had already received approval for farxiga patient assistance. NP updated.          "Chart reviewed. Seen in hospital by Dr. Oval Linsey with new onset acute systolic heart failure. She was started on GDMT for heart failure, HLD, coronary calcification.    May stop Rosuvastatin. Start Pravastatin 20mg  daily.    Mirtazapine refills will need addressed by PCP.    Wilder Glade presently prescribed for heart failure benefit. [facility] - she does not have Medicare part D - she did pick up 30 day supply for $631. Will need to contact [facility] ([place]) to see if they can help her with cost assistance. Information listed below. We can provide 2 weeks of samples as well as patient assistance paperwork to fill out. But need to ensure we find a sustainable way to get the [place]. CCing [place], [place] in case I missed a resource.   [facility] of [place] [address] [phone] ext. [phone]   Would likely be beneficial to discuss with daughter. Needs to fill out DPR at clinic visit. Can ask her on phone if okay if we call her daughter to relay information.   Loel Dubonnet, NP

## 2022-03-26 NOTE — Addendum Note (Signed)
Addended by: Gerald Stabs on: 03/26/2022 12:11 PM   Modules accepted: Orders

## 2022-03-26 NOTE — Telephone Encounter (Signed)
H&V Care Navigation CSW Progress Note  Clinical Social Worker  mailed pt [place]  to assist with Farxiga PAP as pt does not have Part D benefits. Confirmed w/ pharmacy assistance that Medicare advantage plan does not include pharmacy- I confirmed number provided to pt for [facility] counselors is correct to see if pt can be enrolled in Part D or another prescription plan.   Patient is participating in a Managed Medicaid Plan:  No, BCBS Medicare  [place]: No Food Insecurity (02/08/2022)  Housing: Low Risk  (02/08/2022)  Transportation Needs: No Transportation Needs (02/08/2022)  Utilities: Not At Risk (02/08/2022)  Tobacco Use: Low Risk  (03/03/2022)   Westley Hummer, MSW, [place]  [phone]- work cell phone (preferred) [phone]- desk phone

## 2022-04-01 DIAGNOSIS — I429 Cardiomyopathy, unspecified: Secondary | ICD-10-CM | POA: Diagnosis not present

## 2022-04-01 DIAGNOSIS — J449 Chronic obstructive pulmonary disease, unspecified: Secondary | ICD-10-CM | POA: Diagnosis not present

## 2022-04-01 DIAGNOSIS — I351 Nonrheumatic aortic (valve) insufficiency: Secondary | ICD-10-CM | POA: Diagnosis not present

## 2022-04-01 DIAGNOSIS — E039 Hypothyroidism, unspecified: Secondary | ICD-10-CM | POA: Diagnosis not present

## 2022-04-01 DIAGNOSIS — I714 Abdominal aortic aneurysm, without rupture, unspecified: Secondary | ICD-10-CM | POA: Diagnosis not present

## 2022-04-01 DIAGNOSIS — I700 Atherosclerosis of aorta: Secondary | ICD-10-CM | POA: Diagnosis not present

## 2022-04-01 DIAGNOSIS — I2510 Atherosclerotic heart disease of native coronary artery without angina pectoris: Secondary | ICD-10-CM | POA: Diagnosis not present

## 2022-04-01 DIAGNOSIS — M4854XD Collapsed vertebra, not elsewhere classified, thoracic region, subsequent encounter for fracture with routine healing: Secondary | ICD-10-CM | POA: Diagnosis not present

## 2022-04-01 DIAGNOSIS — M419 Scoliosis, unspecified: Secondary | ICD-10-CM | POA: Diagnosis not present

## 2022-04-01 DIAGNOSIS — R0781 Pleurodynia: Secondary | ICD-10-CM | POA: Diagnosis not present

## 2022-04-01 DIAGNOSIS — I7300 Raynaud's syndrome without gangrene: Secondary | ICD-10-CM | POA: Diagnosis not present

## 2022-04-01 DIAGNOSIS — I110 Hypertensive heart disease with heart failure: Secondary | ICD-10-CM | POA: Diagnosis not present

## 2022-04-01 DIAGNOSIS — G8929 Other chronic pain: Secondary | ICD-10-CM | POA: Diagnosis not present

## 2022-04-01 DIAGNOSIS — M47819 Spondylosis without myelopathy or radiculopathy, site unspecified: Secondary | ICD-10-CM | POA: Diagnosis not present

## 2022-04-01 DIAGNOSIS — I5041 Acute combined systolic (congestive) and diastolic (congestive) heart failure: Secondary | ICD-10-CM | POA: Diagnosis not present

## 2022-04-01 DIAGNOSIS — M8580 Other specified disorders of bone density and structure, unspecified site: Secondary | ICD-10-CM | POA: Diagnosis not present

## 2022-04-02 DIAGNOSIS — I351 Nonrheumatic aortic (valve) insufficiency: Secondary | ICD-10-CM | POA: Diagnosis not present

## 2022-04-02 DIAGNOSIS — M47819 Spondylosis without myelopathy or radiculopathy, site unspecified: Secondary | ICD-10-CM | POA: Diagnosis not present

## 2022-04-02 DIAGNOSIS — I700 Atherosclerosis of aorta: Secondary | ICD-10-CM | POA: Diagnosis not present

## 2022-04-02 DIAGNOSIS — I7300 Raynaud's syndrome without gangrene: Secondary | ICD-10-CM | POA: Diagnosis not present

## 2022-04-02 DIAGNOSIS — E039 Hypothyroidism, unspecified: Secondary | ICD-10-CM | POA: Diagnosis not present

## 2022-04-02 DIAGNOSIS — I5041 Acute combined systolic (congestive) and diastolic (congestive) heart failure: Secondary | ICD-10-CM | POA: Diagnosis not present

## 2022-04-02 DIAGNOSIS — I2510 Atherosclerotic heart disease of native coronary artery without angina pectoris: Secondary | ICD-10-CM | POA: Diagnosis not present

## 2022-04-02 DIAGNOSIS — I429 Cardiomyopathy, unspecified: Secondary | ICD-10-CM | POA: Diagnosis not present

## 2022-04-02 DIAGNOSIS — M8580 Other specified disorders of bone density and structure, unspecified site: Secondary | ICD-10-CM | POA: Diagnosis not present

## 2022-04-02 DIAGNOSIS — M419 Scoliosis, unspecified: Secondary | ICD-10-CM | POA: Diagnosis not present

## 2022-04-02 DIAGNOSIS — M4854XD Collapsed vertebra, not elsewhere classified, thoracic region, subsequent encounter for fracture with routine healing: Secondary | ICD-10-CM | POA: Diagnosis not present

## 2022-04-02 DIAGNOSIS — J449 Chronic obstructive pulmonary disease, unspecified: Secondary | ICD-10-CM | POA: Diagnosis not present

## 2022-04-02 DIAGNOSIS — I110 Hypertensive heart disease with heart failure: Secondary | ICD-10-CM | POA: Diagnosis not present

## 2022-04-02 DIAGNOSIS — R0781 Pleurodynia: Secondary | ICD-10-CM | POA: Diagnosis not present

## 2022-04-02 DIAGNOSIS — I714 Abdominal aortic aneurysm, without rupture, unspecified: Secondary | ICD-10-CM | POA: Diagnosis not present

## 2022-04-02 DIAGNOSIS — G8929 Other chronic pain: Secondary | ICD-10-CM | POA: Diagnosis not present

## 2022-04-09 ENCOUNTER — Encounter (HOSPITAL_BASED_OUTPATIENT_CLINIC_OR_DEPARTMENT_OTHER): Payer: Self-pay | Admitting: Family

## 2022-04-09 ENCOUNTER — Ambulatory Visit (HOSPITAL_BASED_OUTPATIENT_CLINIC_OR_DEPARTMENT_OTHER): Payer: Medicare Other | Admitting: Family

## 2022-04-09 VITALS — BP 119/65 | HR 78 | Ht 60.0 in | Wt 95.0 lb

## 2022-04-09 DIAGNOSIS — E785 Hyperlipidemia, unspecified: Secondary | ICD-10-CM

## 2022-04-09 DIAGNOSIS — I2510 Atherosclerotic heart disease of native coronary artery without angina pectoris: Secondary | ICD-10-CM

## 2022-04-09 DIAGNOSIS — I714 Abdominal aortic aneurysm, without rupture, unspecified: Secondary | ICD-10-CM

## 2022-04-09 DIAGNOSIS — I2584 Coronary atherosclerosis due to calcified coronary lesion: Secondary | ICD-10-CM

## 2022-04-09 DIAGNOSIS — Z9889 Other specified postprocedural states: Secondary | ICD-10-CM

## 2022-04-09 DIAGNOSIS — I351 Nonrheumatic aortic (valve) insufficiency: Secondary | ICD-10-CM | POA: Diagnosis not present

## 2022-04-09 DIAGNOSIS — I5022 Chronic systolic (congestive) heart failure: Secondary | ICD-10-CM

## 2022-04-09 MED ORDER — EZETIMIBE 10 MG PO TABS: 10.0000 mg | ORAL_TABLET | Freq: Every day | ORAL | 0 refills | Status: DC

## 2022-04-09 MED ORDER — METOPROLOL SUCCINATE ER 25 MG PO TB24: 12.5000 mg | ORAL_TABLET | Freq: Every day | ORAL | 1 refills | Status: DC

## 2022-04-09 MED ORDER — SPIRONOLACTONE 25 MG PO TABS: 12.5000 mg | ORAL_TABLET | Freq: Every day | ORAL | 1 refills | Status: DC

## 2022-04-09 NOTE — Progress Notes (Signed)
Office Visit    Patient Name: Stephanie Collier Date of Encounter: 04/09/2022  PCP:  Stephanie Collier, Mary Esther  Cardiologist:  Stephanie Latch, MD  Advanced Practice Provider:  No care team member to display Electrophysiologist:  None      Chief Complaint    Stephanie Collier is a 87 y.o. female presents today for Collier follow up   Past Medical History    Past Medical History:  Diagnosis Date   AAA (abdominal aortic aneurysm) ([place])    Arthritis    hip   Hypertension    no meds now   Osteoporosis    Patellar fracture    left   Raynaud's disease    Scoliosis    Past Surgical History:  Procedure Laterality Date   APPENDECTOMY     at age 7   BREAST BIOPSY Left    CHEST TUBE INSERTION Left 04/04/2014   Procedure: CHEST TUBE INSERTION;  Surgeon: Stephanie Mitchell, MD;  Location: [facility];  Service: Vascular;  Laterality: Left;  by Dr. Vernetta Collier   INSERTION OF VENA CAVA FILTER N/A 04/02/2014   Procedure: INSERTION OF VENA CAVA FILTER;  Surgeon: Stephanie Mitchell, MD;  Location: [facility];  Service: Cardiovascular;  Laterality: N/A;   INTRAMEDULLARY (IM) NAIL INTERTROCHANTERIC Left 06/04/2021   Procedure: INTRAMEDULLARY (IM) NAIL INTERTROCHANTRIC;  Surgeon: Stephanie Harrison, MD;  Location: [facility];  Service: Orthopedics;  Laterality: Left;   IR KYPHO EA ADDL LEVEL THORACIC OR LUMBAR  02/10/2022   IR KYPHO LUMBAR INC FX REDUCE BONE BX UNI/BIL CANNULATION INC/IMAGING  02/10/2022   ORIF PATELLA Left 11/20/2020   Procedure: OPEN REDUCTION INTERNAL (ORIF) FIXATION PATELLA;  Surgeon: Stephanie Sheng, MD;  Location: [facility];  Service: Orthopedics;  Laterality: Left;   PERIPHERAL VASCULAR CATHETERIZATION N/A 06/26/2014   Procedure: IVC Filter Removal;  Surgeon: Stephanie Mitchell, MD;  Location: [facility];  Service: Cardiovascular;  Laterality: N/A;   THORACIC AORTIC ENDOVASCULAR STENT GRAFT N/A 04/04/2014   Procedure: THORACIC AORTIC  ENDOVASCULAR STENT GRAFT;  Surgeon: Stephanie Mitchell, MD;  Location: [facility];  Service: Vascular;  Laterality: N/A;    Allergies  Allergies  Allergen Reactions   Fentanyl Shortness Of Breath   Acetaminophen Other (See Comments)    Constipation    Asa [Aspirin] Other (See Comments)    Per patient stomach cramps   Azithromycin Diarrhea   Calcium Carb-Magnesium Carb Other (See Comments)    skin lesions   Fosamax [Alendronate] Other (See Comments)    indigestion   Labetalol Hcl Other (See Comments)     indigestion    History of Present Illness    Stephanie Collier is a 87 y.o. female with a hx of chronic systolic and diastolic heart failure (LVEF 30 to 35%), severe aortic regurgitation, mild ascending aortic aneurysm, 4.5 cm infrarenal AAA, thoracic aorta penetrating ulcer, hypertension last seen while hospitalized.  Admitted 1/27-02/12/22 with compression fracture of T5-T7 and underwent cardiac arthroplasty L3 and L4.  Admitted 2/21-2/29/24 with new onset systolic heart failure LVEF 30 to 35% (previous echo 05/2021 LVEF 60%) with mobile echodensity 0.7X 0.7 cm in the right ventricular outflow tract.  Lower extremity duplex negative for DVT.  Patient politely declined cardiac catheterization and elected for less invasive course.  Diuresed with IV Lasix and discharged on p.o. Lasix.  New onset hypothyroidism and was started on Synthroid.  She contacted the office 03/25/2022 regarding medications.  She  reported rosuvastatin gave her stomach pain, indigestion, insomnia and was instead started on pravastatin 20 mg daily.  W[place]ade was noted to be very expensive as she does not have Medicare part D.  She was provided [facility] info  She declined palliative care services when they contacted her 03/25/22.  She presents today for follow up with her daughter. Weight at discharge 99lbs and weight today in clinic 95 lbs. reports weight has been stable at home.  She is most concerned regarding her back.Notes when she  does something she hurts but does want to be able to do more. She is participating in [place] once per week. Has not had to take anything for pain as she notes most often it gest better when she sits and rests in her recliner or her bed. Notes she does not have more than 5 minutes of stamina.   Reports recurrent abdominal pain, diarrhea after 1 dose of pravastatin. Requests to trial non statin medication. She has been approved for [place] patient assistance and received a 90-day supply in the mail.  Reports no shortness of breath at rest and only mild exertional dyspnea which she attributes to deconditioning.  Reports no chest pain, pressure, or tightness. No edema, orthopnea, PND. Reports no palpitations.    EKGs/Labs/Other Studies Reviewed:   The following studies were reviewed today: Cardiac Studies & Procedures       ECHOCARDIOGRAM  ECHOCARDIOGRAM COMPLETE 03/04/2022  Narrative ECHOCARDIOGRAM REPORT    Patient Name:   Stephanie Collier Date of Exam: 03/04/2022 Medical Rec #:  [id number]  Height:       60.0 in Accession #:    [id number] Weight:       97.0 lb Date of Birth:  [birth year]  BSA:          1.372 m Patient Age:    61 years   BP:           157/75 mmHg Patient Gender: F          HR:           86 bpm. Exam Location:  Inpatient  Procedure: 2D Echo, Cardiac Doppler and Color Doppler  Indications:    I50.21 CHF  History:        Patient has prior history of Echocardiogram examinations, most recent 06/04/2021. CHF, COPD and History of thoracic aortic aneurysm history of stenting of aorta, Signs/Symptoms:Murmur; Risk Factors:Hypertension.  Sonographer:    Stephanie Collier, RDCS Referring Phys: [id number] [facility]   Sonographer Comments: Image difficulty due to kyphoscoliosis and patient unable to lay flat IMPRESSIONS   1. Independently mobile echodensity 0.7 x 0.7 cm, which appears to be in the right ventricular outflow tract based on parasternal imaging. Image 17-19 best  demonstrate, not well seen elsewhere. Consider TEE if clinically relevant. 2. The aortic valve was not well visualized. There is moderate calcification of the aortic valve. Aortic valve regurgitation is severe. Aortic valve sclerosis/calcification is present, without any evidence of aortic stenosis. Stroke volume index 34 ml/m2 and DVI 0.43. 3. Left ventricular ejection fraction, by estimation, is 30 to 35%. The left ventricle has moderately decreased function. The left ventricle demonstrates regional wall motion abnormalities (see scoring diagram/findings for description). There is mild left ventricular hypertrophy. Left ventricular diastolic parameters are indeterminate. 4. Right ventricular systolic function is normal. The right ventricular size is normal. Tricuspid regurgitation signal is inadequate for assessing PA pressure. 5. The mitral valve is grossly normal. Mild mitral valve regurgitation. 6. The inferior vena cava  is normal in size with greater than 50% respiratory variability, suggesting right atrial pressure of 3 mmHg.  Comparison(s): A prior study was performed on 06/04/21. Prior images reviewed side by side. Aortic valve regurgitation appears similar. LV function has decreased.  FINDINGS Left Ventricle: Left ventricular ejection fraction, by estimation, is 30 to 35%. The left ventricle has moderately decreased function. The left ventricle demonstrates regional wall motion abnormalities. The left ventricular internal cavity size was normal in size. There is mild left ventricular hypertrophy. Left ventricular diastolic parameters are indeterminate.  Right Ventricle: The right ventricular size is normal. Right vetricular wall thickness was not well visualized. Right ventricular systolic function is normal. Tricuspid regurgitation signal is inadequate for assessing PA pressure.  Left Atrium: Left atrial size was normal in size.  Right Atrium: Right atrial size was normal in  size.  Pericardium: There is no evidence of pericardial effusion.  Mitral Valve: The mitral valve is grossly normal. Mild mitral valve regurgitation.  Tricuspid Valve: The tricuspid valve is not well visualized. Tricuspid valve regurgitation is trivial.  Aortic Valve: The aortic valve was not well visualized. There is moderate calcification of the aortic valve. Aortic valve regurgitation is severe. Aortic regurgitation PHT measures 353 msec. Aortic valve sclerosis/calcification is present, without any evidence of aortic stenosis. Aortic valve mean gradient measures 6.0 mmHg. Aortic valve peak gradient measures 9.8 mmHg. Aortic valve area, by VTI measures 1.79 cm.  Pulmonic Valve: The pulmonic valve was not well visualized. Pulmonic valve regurgitation is trivial.  Aorta: The aortic root is normal in size and structure and the ascending aorta was not well visualized.  Venous: The inferior vena cava is normal in size with greater than 50% respiratory variability, suggesting right atrial pressure of 3 mmHg.  IAS/Shunts: The interatrial septum was not well visualized.   LEFT VENTRICLE PLAX 2D LVIDd:         5.20 cm   Diastology LVIDs:         4.40 cm   LV e' medial:   2.96 cm/s LV PW:         1.20 cm   LV E/e' medial: 5.1 LV IVS:        1.00 cm LVOT diam:     2.30 cm LV SV:         46 LV SV Index:   34 LVOT Area:     4.15 cm   RIGHT VENTRICLE RV Basal diam:  2.40 cm RV S prime:     23.40 cm/s TAPSE (M-mode): 2.7 cm  LEFT ATRIUM             Index        RIGHT ATRIUM           Index LA diam:        2.70 cm 1.97 cm/m   RA Area:     19.00 cm LA Vol (A2C):   36.6 ml 26.67 ml/m  RA Volume:   48.60 ml  35.41 ml/m LA Vol (A4C):   16.1 ml 11.73 ml/m LA Biplane Vol: 24.6 ml 17.92 ml/m AORTIC VALVE AV Area (Vmax):    1.99 cm AV Area (Vmean):   1.85 cm AV Area (VTI):     1.79 cm AV Vmax:           156.41 cm/s AV Vmean:          116.818 cm/s AV VTI:            0.259 m AV  Peak  Grad:      9.8 mmHg AV Mean Grad:      6.0 mmHg LVOT Vmax:         74.73 cm/s LVOT Vmean:        52.133 cm/s LVOT VTI:          0.111 m LVOT/AV VTI ratio: 0.43 AI PHT:            353 msec  AORTA Ao Root diam: 3.50 cm Ao Asc diam:  3.90 cm  MV E velocity: 14.98 cm/s SHUNTS Systemic VTI:  0.11 m Systemic Diam: 2.30 cm  Stephanie Kaiser MD Electronically signed by Stephanie Kaiser MD Signature Date/Time: 03/04/2022/2:20:45 PM    Final              EKG:  EKG is not ordered today.    Recent Labs: 03/03/2022: TSH 7.095 03/04/2022: ALT 14 03/05/2022: Magnesium 1.8 03/09/2022: B Natriuretic Peptide 419.2; Hemoglobin 13.6; Platelets 123 03/11/2022: BUN 21; Creatinine, Ser 0.78; Potassium 4.0; Sodium 136  Recent Lipid Panel    Component Value Date/Time   CHOL 179 03/09/2022 0044   TRIG 138 03/09/2022 0044   HDL 40 (L) 03/09/2022 0044   CHOLHDL 4.5 03/09/2022 0044   VLDL 28 03/09/2022 0044   LDLCALC 111 (H) 03/09/2022 0044     Home Medications   Current Meds  Medication Sig   acetaminophen (TYLENOL) 325 MG tablet Take 2 tablets (650 mg total) by mouth every 6 (six) hours as needed for mild pain (or Fever >/= 101).   dapagliflozin propanediol (FARXIGA) 10 MG TABS tablet Take 1 tablet (10 mg total) by mouth daily.   docusate sodium (COLACE) 100 MG capsule Take 1 capsule (100 mg total) by mouth 2 (two) times daily. (Patient taking differently: Take 100 mg by mouth 2 (two) times daily as needed for moderate constipation.)   feeding supplement (ENSURE ENLIVE / ENSURE PLUS) LIQD Take 237 mLs by mouth 2 (two) times daily between meals.   furosemide (LASIX) 40 MG tablet Take 1 tablet (40 mg total) by mouth daily. And extra 40mg  as needed if shortness of breath, worsening edema, increase in weight >5lbs in 3 days   levothyroxine (SYNTHROID) 25 MCG tablet Take 1 tablet (25 mcg total) by mouth daily before breakfast.   lidocaine (LIDODERM) 5 % Place 1 patch onto the skin daily. Remove  & Discard patch within 12 hours or as directed by MD   metoprolol succinate (TOPROL-XL) 25 MG 24 hr tablet Take 0.5 tablets (12.5 mg total) by mouth daily.   Multiple Vitamin (MULTIVITAMIN PO) Take 1 tablet by mouth daily.   polyethylene glycol (MIRALAX / GLYCOLAX) 17 g packet Take 17 g by mouth daily as needed for mild constipation or moderate constipation.   spironolactone (ALDACTONE) 25 MG tablet Take 0.5 tablets (12.5 mg total) by mouth daily.   VITAMIN D PO Take 1 tablet by mouth daily.     Review of Systems      All other systems reviewed and are otherwise negative except as noted above.  Physical Exam    VS:  BP 119/65 (BP Location: Right Arm, Patient Position: Sitting, Cuff Size: Normal)   Pulse 78   Ht 5' (1.524 m)   Wt 95 lb (43.1 kg)   SpO2 97%   BMI 18.55 kg/m  , BMI Body mass index is 18.55 kg/m.  Wt Readings from Last 3 Encounters:  04/09/22 95 lb (43.1 kg)  03/11/22 104 lb 15 oz (47.6 kg)  02/06/22  97 lb (44 kg)    GEN: Frail appearing,  in no acute distress. HEENT: normal. Neck: Supple, no JVD, carotid bruits, or masses. Cardiac: RRR, no murmurs, rubs, or gallops. No clubbing, cyanosis, edema.  Radials/PT 2+ and equal bilaterally.  Respiratory:  Respirations regular and unlabored, clear to auscultation bilaterally. GI: Soft, nontender, nondistended. MS: No deformity or atrophy. Skin: Warm and dry, no rash. Neuro:  Strength and sensation are intact. Psych: Normal affect.  Assessment & Plan    Systolic heart failure- Euvolemic and well compensated on exam.  Continue GDMT Farxiga 10 mg daily, furosemide 40 mg daily, Toprol 12.5 mg daily, spironolactone 12.5 mg daily.  Low suspicion BP would tolerate ARB/ARNI.  She has patient assistance for [place]. Low sodium diet, fluid restriction <2L, and daily weights encouraged. Educated to contact our office for weight gain of 2 lbs overnight or 5 lbs in one week.  Refills provided.  Coronary artery  calcification/hyperlipidemia- Stable with no anginal symptoms. No indication for ischemic evaluation.  Reports intolerance to rosuvastatin as well as pravastatin with abdominal pain and diarrhea.  Rx Zetia 10 mg daily.  She requests [place] only send 10 tablets and she will let us know if it works well.  Consider repeat lipids at follow-up.  Severe AR-would recommend avoidance of bradycardia.  She is not an operative candidate.  AAA/penetrating aortic ulcer-s/p endovascular repair.  Continue to follow with [facility].  S/p kyphoplasty - Still with back pain. Encouraged to continue working with PT. Encouraged to follow up with PCP and/or interventional radiology who performed her procedure.   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  Clinically no evidence of endocarditis.  No further evaluation at this time.         Disposition: Follow up in 3 month(s) with Stephanie Latch, MD or APP.  Signed, Loel Dubonnet, NP 04/09/2022, 2:17 PM [place]

## 2022-04-09 NOTE — Patient Instructions (Addendum)
Medication Instructions:  Your physician has recommended you make the following change in your medication:  STOP Pravastatin  START Zetia 10mg  daily  Refills of Spironolactone and Metoprolol have been sent to your pharmacy  *If you need a refill on your cardiac medications before your next appointment, please call your pharmacy*   Lab Work: Recommend basic metabolic panel at your next office visit with primary care.  Follow-Up: At [facility], you and your health needs are our priority.  As part of our continuing mission to provide you with exceptional heart care, we have created designated Provider Care Teams.  These Care Teams include your primary Cardiologist (physician) and Advanced Practice Providers (APPs -  Physician Assistants and Nurse Practitioners) who all work together to provide you with the care you need, when you need it.  We recommend signing up for the patient portal called "MyChart".  Sign up information is provided on this After Visit Summary.  MyChart is used to connect with patients for Virtual Visits (Telemedicine).  Patients are able to view lab/test results, encounter notes, upcoming appointments, etc.  Non-urgent messages can be sent to your provider as well.   To learn more about what you can do with MyChart, go to [email or web address].    Your next appointment:   3 month(s)  Provider:   Skeet Latch, MD or Laurann Montana, NP    Other Instructions  Can reach out to Dr. Luanne Bras regarding your kyphoplasty discussed in the hospital. He is located at:  [place] [facility] [address] [place], [place] [address] [phone]

## 2022-04-15 DIAGNOSIS — I2510 Atherosclerotic heart disease of native coronary artery without angina pectoris: Secondary | ICD-10-CM | POA: Diagnosis not present

## 2022-04-15 DIAGNOSIS — Z955 Presence of coronary angioplasty implant and graft: Secondary | ICD-10-CM | POA: Diagnosis not present

## 2022-04-15 DIAGNOSIS — R1310 Dysphagia, unspecified: Secondary | ICD-10-CM | POA: Diagnosis not present

## 2022-04-15 DIAGNOSIS — B370 Candidal stomatitis: Secondary | ICD-10-CM | POA: Diagnosis not present

## 2022-04-15 DIAGNOSIS — Z681 Body mass index (BMI) 19 or less, adult: Secondary | ICD-10-CM | POA: Diagnosis not present

## 2022-04-15 DIAGNOSIS — G4700 Insomnia, unspecified: Secondary | ICD-10-CM | POA: Diagnosis not present

## 2022-04-15 DIAGNOSIS — R0781 Pleurodynia: Secondary | ICD-10-CM | POA: Diagnosis not present

## 2022-04-15 DIAGNOSIS — Z9181 History of falling: Secondary | ICD-10-CM | POA: Diagnosis not present

## 2022-04-15 DIAGNOSIS — M419 Scoliosis, unspecified: Secondary | ICD-10-CM | POA: Diagnosis not present

## 2022-04-15 DIAGNOSIS — I110 Hypertensive heart disease with heart failure: Secondary | ICD-10-CM | POA: Diagnosis not present

## 2022-04-15 DIAGNOSIS — M47819 Spondylosis without myelopathy or radiculopathy, site unspecified: Secondary | ICD-10-CM | POA: Diagnosis not present

## 2022-04-15 DIAGNOSIS — E039 Hypothyroidism, unspecified: Secondary | ICD-10-CM | POA: Diagnosis not present

## 2022-04-15 DIAGNOSIS — M4854XD Collapsed vertebra, not elsewhere classified, thoracic region, subsequent encounter for fracture with routine healing: Secondary | ICD-10-CM | POA: Diagnosis not present

## 2022-04-15 DIAGNOSIS — I351 Nonrheumatic aortic (valve) insufficiency: Secondary | ICD-10-CM | POA: Diagnosis not present

## 2022-04-15 DIAGNOSIS — I5041 Acute combined systolic (congestive) and diastolic (congestive) heart failure: Secondary | ICD-10-CM | POA: Diagnosis not present

## 2022-04-15 DIAGNOSIS — G8929 Other chronic pain: Secondary | ICD-10-CM | POA: Diagnosis not present

## 2022-04-15 DIAGNOSIS — I714 Abdominal aortic aneurysm, without rupture, unspecified: Secondary | ICD-10-CM | POA: Diagnosis not present

## 2022-04-15 DIAGNOSIS — I7300 Raynaud's syndrome without gangrene: Secondary | ICD-10-CM | POA: Diagnosis not present

## 2022-04-15 DIAGNOSIS — I429 Cardiomyopathy, unspecified: Secondary | ICD-10-CM | POA: Diagnosis not present

## 2022-04-15 DIAGNOSIS — M8580 Other specified disorders of bone density and structure, unspecified site: Secondary | ICD-10-CM | POA: Diagnosis not present

## 2022-04-15 DIAGNOSIS — F419 Anxiety disorder, unspecified: Secondary | ICD-10-CM | POA: Diagnosis not present

## 2022-04-15 DIAGNOSIS — J449 Chronic obstructive pulmonary disease, unspecified: Secondary | ICD-10-CM | POA: Diagnosis not present

## 2022-04-15 DIAGNOSIS — I700 Atherosclerosis of aorta: Secondary | ICD-10-CM | POA: Diagnosis not present

## 2022-04-16 DIAGNOSIS — I714 Abdominal aortic aneurysm, without rupture, unspecified: Secondary | ICD-10-CM | POA: Diagnosis not present

## 2022-04-16 DIAGNOSIS — J449 Chronic obstructive pulmonary disease, unspecified: Secondary | ICD-10-CM | POA: Diagnosis not present

## 2022-04-16 DIAGNOSIS — M8580 Other specified disorders of bone density and structure, unspecified site: Secondary | ICD-10-CM | POA: Diagnosis not present

## 2022-04-16 DIAGNOSIS — I351 Nonrheumatic aortic (valve) insufficiency: Secondary | ICD-10-CM | POA: Diagnosis not present

## 2022-04-16 DIAGNOSIS — I429 Cardiomyopathy, unspecified: Secondary | ICD-10-CM | POA: Diagnosis not present

## 2022-04-16 DIAGNOSIS — E039 Hypothyroidism, unspecified: Secondary | ICD-10-CM | POA: Diagnosis not present

## 2022-04-16 DIAGNOSIS — M47819 Spondylosis without myelopathy or radiculopathy, site unspecified: Secondary | ICD-10-CM | POA: Diagnosis not present

## 2022-04-16 DIAGNOSIS — I2510 Atherosclerotic heart disease of native coronary artery without angina pectoris: Secondary | ICD-10-CM | POA: Diagnosis not present

## 2022-04-16 DIAGNOSIS — I5041 Acute combined systolic (congestive) and diastolic (congestive) heart failure: Secondary | ICD-10-CM | POA: Diagnosis not present

## 2022-04-16 DIAGNOSIS — R0781 Pleurodynia: Secondary | ICD-10-CM | POA: Diagnosis not present

## 2022-04-16 DIAGNOSIS — I700 Atherosclerosis of aorta: Secondary | ICD-10-CM | POA: Diagnosis not present

## 2022-04-16 DIAGNOSIS — I7300 Raynaud's syndrome without gangrene: Secondary | ICD-10-CM | POA: Diagnosis not present

## 2022-04-16 DIAGNOSIS — I110 Hypertensive heart disease with heart failure: Secondary | ICD-10-CM | POA: Diagnosis not present

## 2022-04-16 DIAGNOSIS — G8929 Other chronic pain: Secondary | ICD-10-CM | POA: Diagnosis not present

## 2022-04-16 DIAGNOSIS — M419 Scoliosis, unspecified: Secondary | ICD-10-CM | POA: Diagnosis not present

## 2022-04-16 DIAGNOSIS — M4854XD Collapsed vertebra, not elsewhere classified, thoracic region, subsequent encounter for fracture with routine healing: Secondary | ICD-10-CM | POA: Diagnosis not present

## 2022-04-17 DIAGNOSIS — I509 Heart failure, unspecified: Secondary | ICD-10-CM | POA: Diagnosis not present

## 2022-04-17 DIAGNOSIS — R1310 Dysphagia, unspecified: Secondary | ICD-10-CM | POA: Diagnosis not present

## 2022-04-17 DIAGNOSIS — J9601 Acute respiratory failure with hypoxia: Secondary | ICD-10-CM | POA: Diagnosis not present

## 2022-04-17 DIAGNOSIS — S72145S Nondisplaced intertrochanteric fracture of left femur, sequela: Secondary | ICD-10-CM | POA: Diagnosis not present

## 2022-04-17 DIAGNOSIS — J9611 Chronic respiratory failure with hypoxia: Secondary | ICD-10-CM | POA: Diagnosis not present

## 2022-04-23 DIAGNOSIS — I509 Heart failure, unspecified: Secondary | ICD-10-CM | POA: Diagnosis not present

## 2022-04-23 DIAGNOSIS — I110 Hypertensive heart disease with heart failure: Secondary | ICD-10-CM | POA: Diagnosis not present

## 2022-04-23 DIAGNOSIS — I700 Atherosclerosis of aorta: Secondary | ICD-10-CM | POA: Diagnosis not present

## 2022-04-23 DIAGNOSIS — I5020 Unspecified systolic (congestive) heart failure: Secondary | ICD-10-CM | POA: Diagnosis not present

## 2022-04-23 DIAGNOSIS — E46 Unspecified protein-calorie malnutrition: Secondary | ICD-10-CM | POA: Diagnosis not present

## 2022-04-23 DIAGNOSIS — I10 Essential (primary) hypertension: Secondary | ICD-10-CM | POA: Diagnosis not present

## 2022-04-27 ENCOUNTER — Telehealth: Payer: Self-pay

## 2022-04-27 NOTE — Telephone Encounter (Signed)
(  3:50 pm) PC SW left a voice message for patient's daughter-[name] requesting a call back to discuss palliative care referral and schedule an initial visit.

## 2022-05-05 ENCOUNTER — Other Ambulatory Visit: Payer: Medicare Other

## 2022-05-05 DIAGNOSIS — Z515 Encounter for palliative care: Secondary | ICD-10-CM

## 2022-05-07 ENCOUNTER — Encounter (HOSPITAL_BASED_OUTPATIENT_CLINIC_OR_DEPARTMENT_OTHER): Payer: Self-pay

## 2022-05-13 ENCOUNTER — Other Ambulatory Visit: Payer: Medicare Other

## 2022-05-13 VITALS — BP 110/56 | HR 82 | Temp 98.2°F | Wt 92.8 lb

## 2022-05-13 DIAGNOSIS — Z515 Encounter for palliative care: Secondary | ICD-10-CM

## 2022-05-13 NOTE — Progress Notes (Signed)
PATIENT NAME: Stephanie Collier DOB: [birth year] MRN: [id number]  PRIMARY CARE PROVIDER: Lorenda Ishihara, MD  RESPONSIBLE PARTY:  Acct ID - Guarantor Home Phone Work Phone Relationship Acct Type  [id number] ANDRETTA, WALLOCH [phone]  Self P/F     [address] [place], [place] [address]    Palliative Care Initial Encounter Note   Completed home visit. Daughter [name] present.        TODAY'S VISIT:  Respiratory: Nurse observes some SOB at rest; denies SOB this visit  Cardiac: CHF; pt is to drink a quart of fluids per day  Cognitive: alert and oriented x4   Appetite: 3 reduced meals; tries to drink 32oz; upon arrival pt was eating a full size salad  GI/GU: has 2-4 BMs per day; no urinary issues other than occasional incontinence; wears an adult pull up   Mobility: uses her rolling walker and quad cane; pt has scoliosis; at the end of the visit pt demonstrated how well she can ambulate with her walker around the home  ADLs: independent with bathing, grooming, dressing; can do those ADLs but it makes her extremely tired; she must rest for a while before doing anything else   Sleeping Pattern: pt reports she is unable to sleep at night; sometimes she naps in the day time  Pain: back aches all of the time  Wt: 92.8 lbs at this time  Palliative Care/ Hospice: LPN explained role and purpose of palliative care including visit frequency. Also discussed benefits of hospice care as well as the differences between the two with patient.   Goals of Care: To stay in the home and be mobile as long as possible    Next Appt Scheduled For:     CODE STATUS: DNR ADVANCED DIRECTIVES: N MOST FORM: N PPS: 60%   PHYSICAL EXAM:   VITALS: Today's Vitals   05/13/22 1502  BP: (!) 110/56  Pulse: 82  Temp: 98.2 F (36.8 C)  TempSrc: Temporal  SpO2: 96%  Weight: 92 lb 12.8 oz (42.1 kg)  PainSc: 0-No pain    LUNGS: clear to auscultation , decreased breath sounds CARDIAC: Cor  RRR EXTREMITIES: AROM x4 SKIN: cool, dry and intact  NEURO: negative except for weakness       Brei Pociask Clementeen Graham, LPN

## 2022-05-14 NOTE — Progress Notes (Signed)
COMMUNITY PALLIATIVE CARE SW NOTE  PATIENT NAME: Genesha Colangelo DOB: [birth year] MRN: [id number]  PRIMARY CARE PROVIDER: Lorenda Ishihara, MD  RESPONSIBLE PARTY:  Acct ID - Guarantor Home Phone Work Phone Relationship Acct Type  [id number] CHERRELL, VOCK [phone]  Self P/F     [address] [place], [place] [address]   Initial Palliative Care Telephonic Encounter  PC SW completed a telephonic encounter with patient's daughter-[name]. SW provided an in dept explanation of services, to include visit frequency, role in patient's care, difference in hospice care and when/how team should be contacted. [name] verbalized understanding. She provided an initial consent to services. She also expressed her openness to the support.   Follow-up visit is scheduled for 05/13/22 @ 2:30 pm.   Social History   Tobacco Use   Smoking status: Never   Smokeless tobacco: Never  Substance Use Topics   Alcohol use: Not Currently    Comment: rare wine    CODE STATUS: Full Code ADVANCED DIRECTIVES: No MOST FORM COMPLETE:  No HOSPICE EDUCATION PROVIDED: Yes  Duration of visit and documentation: 30 minutes  [employer], LCSW

## 2022-05-17 DIAGNOSIS — J9611 Chronic respiratory failure with hypoxia: Secondary | ICD-10-CM | POA: Diagnosis not present

## 2022-05-17 DIAGNOSIS — S72145S Nondisplaced intertrochanteric fracture of left femur, sequela: Secondary | ICD-10-CM | POA: Diagnosis not present

## 2022-05-17 DIAGNOSIS — I509 Heart failure, unspecified: Secondary | ICD-10-CM | POA: Diagnosis not present

## 2022-05-17 DIAGNOSIS — R1310 Dysphagia, unspecified: Secondary | ICD-10-CM | POA: Diagnosis not present

## 2022-05-17 DIAGNOSIS — J9601 Acute respiratory failure with hypoxia: Secondary | ICD-10-CM | POA: Diagnosis not present

## 2022-06-09 ENCOUNTER — Other Ambulatory Visit: Payer: Medicare Other

## 2022-06-09 VITALS — BP 90/58 | HR 76 | Temp 98.8°F

## 2022-06-09 DIAGNOSIS — Z515 Encounter for palliative care: Secondary | ICD-10-CM

## 2022-06-09 NOTE — Progress Notes (Unsigned)
PATIENT NAME: Stephanie Collier DOB: 1935/04/25 MRN: [id number]  PRIMARY CARE PROVIDER: Lorenda Ishihara, MD  RESPONSIBLE PARTY:  Acct ID - Guarantor Home Phone Work Phone Relationship Acct Type  [id number] GENIEVA, ROCHEL [phone]  Self P/F     [address] [place], [place] [address]   Palliative Care Initial Encounter Note    Completed home visit. Daughter [name] present.         TODAY'S VISIT:   Respiratory: Nurse observes some SOB  and pt is panting   Cardiac: CHF; pt has crackles bilaterally during auscultation.pt is to drink a quart of fluids per day   Cognitive: alert and oriented x4   Appetite: 3 reduced meals; tries to drink 32oz; upon arrival pt was eating a full size salad   GI/GU: has 2-4 BMs per day; no urinary issues other than occasional incontinence; wears an adult pull up   Mobility: uses her rolling walker and quad cane; pt has scoliosis; at the end of the visit pt demonstrated how well she can ambulate with her walker around the home   ADLs: independent with bathing, grooming, dressing; can do those ADLs but it makes her extremely tired; she must rest for a while before doing anything else   Sleeping Pattern: pt reports she is unable to sleep at night; sometimes she naps in the day time   Pain: back aches all of the time   Wt: 91.4 lbs on 06/08/22      Goals of Care: To stay in the home and be mobile as long as possible     Next Appt Scheduled For:       CODE STATUS: DNR ADVANCED DIRECTIVES: N MOST FORM: N PPS: 60%  PHYSICAL EXAM:   VITALS: Today's Vitals   06/09/22 1519  BP: (!) 90/58  Pulse: 76  Temp: 98.8 F (37.1 C)  TempSrc: Temporal  SpO2: 96%  PainSc: 0-No pain    LUNGS: {SYSTEM LUNGS ADULT/PED EXAM:21906} CARDIAC: {Mis exam cardio:32073}} *** EXTREMITIES: {Exam; extremity:10330} SKIN: {Findings; skin exam-one line:31329::"Skin color, texture, turgor normal. No rashes or lesions"}  NEURO: {Findings; ROS  neuro:30532::"negative"}       Kree Rafter Clementeen Graham, RN

## 2022-06-17 DIAGNOSIS — R1310 Dysphagia, unspecified: Secondary | ICD-10-CM | POA: Diagnosis not present

## 2022-06-17 DIAGNOSIS — J9601 Acute respiratory failure with hypoxia: Secondary | ICD-10-CM | POA: Diagnosis not present

## 2022-06-17 DIAGNOSIS — J9611 Chronic respiratory failure with hypoxia: Secondary | ICD-10-CM | POA: Diagnosis not present

## 2022-06-17 DIAGNOSIS — I509 Heart failure, unspecified: Secondary | ICD-10-CM | POA: Diagnosis not present

## 2022-06-17 DIAGNOSIS — S72145S Nondisplaced intertrochanteric fracture of left femur, sequela: Secondary | ICD-10-CM | POA: Diagnosis not present

## 2022-06-18 ENCOUNTER — Ambulatory Visit: Payer: Medicare Other | Admitting: Podiatry

## 2022-06-23 ENCOUNTER — Ambulatory Visit: Payer: Medicare Other | Admitting: Podiatry

## 2022-06-23 DIAGNOSIS — M79674 Pain in right toe(s): Secondary | ICD-10-CM | POA: Diagnosis not present

## 2022-06-23 DIAGNOSIS — B351 Tinea unguium: Secondary | ICD-10-CM | POA: Diagnosis not present

## 2022-06-23 DIAGNOSIS — M79675 Pain in left toe(s): Secondary | ICD-10-CM | POA: Diagnosis not present

## 2022-06-23 NOTE — Progress Notes (Signed)
  Subjective:  Patient ID: Stephanie Collier, female    DOB: Jun 02, 1935,  MRN: [id number]  Chief Complaint  Patient presents with   Nail Problem    Nail trim    87 y.o. female returns for the above complaint.  Patient presents with thickened elongated dystrophic mycotic toenails x 10 mild pain on palpation worse with ambulation worse with pressure she is not able to debride down herself she would like for me to do it denies any other acute complaints.  Objective:  There were no vitals filed for this visit. Podiatric Exam: Vascular: dorsalis pedis and posterior tibial pulses are palpable bilateral. Capillary return is immediate. Temperature gradient is WNL. Skin turgor WNL  Sensorium: Normal Semmes Weinstein monofilament test. Normal tactile sensation bilaterally. Nail Exam: Pt has thick disfigured discolored nails with subungual debris noted bilateral entire nail hallux through fifth toenails.  Pain on palpation to the nails. Ulcer Exam: There is no evidence of ulcer or pre-ulcerative changes or infection. Orthopedic Exam: Muscle tone and strength are WNL. No limitations in general ROM. No crepitus or effusions noted.  Skin: No Porokeratosis. No infection or ulcers    Assessment & Plan:   1. Pain due to onychomycosis of toenails of both feet [B35.1, M79.675, M79.674]     Patient was evaluated and treated and all questions answered.  Onychomycosis with pain  -Nails palliatively debrided as below. -Educated on self-care  Procedure: Nail Debridement Rationale: pain  Type of Debridement: manual, sharp debridement. Instrumentation: Nail nipper, rotary burr. Number of Nails: 10  Procedures and Treatment: Consent by patient was obtained for treatment procedures. The patient understood the discussion of treatment and procedures well. All questions were answered thoroughly reviewed. Debridement of mycotic and hypertrophic toenails, 1 through 5 bilateral and clearing of subungual debris. No  ulceration, no infection noted.  Return Visit-Office Procedure: Patient instructed to return to the office for a follow up visit 3 months for continued evaluation and treatment.  Nicholes Rough, DPM    Return in about 3 months (around 09/23/2022) for Routine Foot Care.

## 2022-06-25 DIAGNOSIS — M545 Low back pain, unspecified: Secondary | ICD-10-CM | POA: Diagnosis not present

## 2022-06-25 DIAGNOSIS — M546 Pain in thoracic spine: Secondary | ICD-10-CM | POA: Diagnosis not present

## 2022-06-30 ENCOUNTER — Other Ambulatory Visit: Payer: Self-pay | Admitting: Orthopedic Surgery

## 2022-06-30 DIAGNOSIS — M545 Low back pain, unspecified: Secondary | ICD-10-CM

## 2022-07-08 NOTE — Progress Notes (Signed)
Cardiology Office Note:  .   Date:  07/09/2022  ID:  Shakaya Bhullar Carbo, DOB [birth year], MRN [id number] PCP: Lorenda Ishihara, MD  [facility] Providers Cardiologist:  Chilton Si, MD    History of Present Illness: .   Darrell Jelley is a 87 y.o. female with past medical history of chronic systolic and diastolic heart failure (LVEF 30 to 35%), severe aortic regurgitation, mild ascending aortic aneurysm, 4.5 cm infrarenal AAA, thoracic aorta penetrating ulcer, hypertension. She is here today for follow up regarding combined heart failure.   She was admitted 02/06/2022 through 02/12/2022 with contracture fracture to T7 and underwent arthroplasty of L3 and L4.  Admitted 2/21 through 03/11/2022 with new onset systolic heart failure LVEF 30 to 35% (previous echo 05/2021 LVEF 60%) with mobile echodensity 0.7 x 0.7 cm in the right ventricular outflow tract.  Lower extremity duplex negative for DVT.  Plan for cardiac catheterization but she elected for less invasive course.  She was diuresed with IV Lasix and discharged on p.o. Lasix.  New onset hypothyroidism and was started on Synthroid.  On 03/25/2022 she contacted the office regarding her medications.  She was reporting rosuvastatin gave stomach pain, indigestion, insomnia and was started on pravastatin 20 mg daily instead.  Marcelline Deist was very expensive she does not have Medicare part D.  She was provided [facility] info. Declined palliative care services when contacted on 03/25/2022.  At her last office visit on 04/09/2022.  Weight remains stable discharge weight was 99 pounds at office visit was 95 pounds. She denied shortness of breath at rest and only mild exertional dyspnea that she attributed to deconditioning.  She reported recurrent abdominal pain, diarrhea after 1 dose of pravastatin, requested a trial of nonstatin medication.  She was started on Zetia 10 mg daily.  She had been approved for [place] patient assistance.   Since last visit has started  with palliative care. At palliative care visit on 06/09/22 she was noted to have bilateral lung crackles and to be short of breath. She reports today that she had not taken her lasix that morning.   Today she states she is doing very well, has been dancing in her house with the assistance of her walker. She endorses some slight dyspnea on exertion that she feels is at her baseline, felt related to deconditioning. She denies chest pain, palpitations, orthopnea, pnd, lower extremity swelling or weight gain. Reports her appetite has improved, is trying to increase her caloric intake. States she tried Zetia once but this resulted in stomach pain, declines further cholesterol treatment.   Recent Labs 04/23/22: BUN 29, creatinine 0.6, eGR 88, K 3.7   ROS: She denies chest pain, palpitations, pnd, orthopnea, n, v, dizziness, syncope, edema, weight gain, or early satiety. All other systems reviewed and are otherwise negative except as noted above.    Studies Reviewed: .       Cardiac Studies & Procedures       ECHOCARDIOGRAM  ECHOCARDIOGRAM COMPLETE 03/04/2022  Narrative ECHOCARDIOGRAM REPORT    Patient Name:   SENOVIA VANKOEVERING Date of Exam: 03/04/2022 Medical Rec #:  [id number]  Height:       60.0 in Accession #:    [id number] Weight:       97.0 lb Date of Birth:  1935/10/07  BSA:          1.372 m Patient Age:    86 years   BP:           157/75 mmHg Patient Gender:  F          HR:           86 bpm. Exam Location:  Inpatient  Procedure: 2D Echo, Cardiac Doppler and Color Doppler  Indications:    I50.21 CHF  History:        Patient has prior history of Echocardiogram examinations, most recent 06/04/2021. CHF, COPD and History of thoracic aortic aneurysm history of stenting of aorta, Signs/Symptoms:Murmur; Risk Factors:Hypertension.  Sonographer:    Chanetta Marshall [facility], RDCS Referring Phys: [id number] [facility]   Sonographer Comments: Image difficulty due to kyphoscoliosis and patient unable  to lay flat IMPRESSIONS   1. Independently mobile echodensity 0.7 x 0.7 cm, which appears to be in the right ventricular outflow tract based on parasternal imaging. Image 17-19 best demonstrate, not well seen elsewhere. Consider TEE if clinically relevant. 2. The aortic valve was not well visualized. There is moderate calcification of the aortic valve. Aortic valve regurgitation is severe. Aortic valve sclerosis/calcification is present, without any evidence of aortic stenosis. Stroke volume index 34 ml/m2 and DVI 0.43. 3. Left ventricular ejection fraction, by estimation, is 30 to 35%. The left ventricle has moderately decreased function. The left ventricle demonstrates regional wall motion abnormalities (see scoring diagram/findings for description). There is mild left ventricular hypertrophy. Left ventricular diastolic parameters are indeterminate. 4. Right ventricular systolic function is normal. The right ventricular size is normal. Tricuspid regurgitation signal is inadequate for assessing PA pressure. 5. The mitral valve is grossly normal. Mild mitral valve regurgitation. 6. The inferior vena cava is normal in size with greater than 50% respiratory variability, suggesting right atrial pressure of 3 mmHg.  Comparison(s): A prior study was performed on 06/04/21. Prior images reviewed side by side. Aortic valve regurgitation appears similar. LV function has decreased.  FINDINGS Left Ventricle: Left ventricular ejection fraction, by estimation, is 30 to 35%. The left ventricle has moderately decreased function. The left ventricle demonstrates regional wall motion abnormalities. The left ventricular internal cavity size was normal in size. There is mild left ventricular hypertrophy. Left ventricular diastolic parameters are indeterminate.  Right Ventricle: The right ventricular size is normal. Right vetricular wall thickness was not well visualized. Right ventricular systolic function is normal.  Tricuspid regurgitation signal is inadequate for assessing PA pressure.  Left Atrium: Left atrial size was normal in size.  Right Atrium: Right atrial size was normal in size.  Pericardium: There is no evidence of pericardial effusion.  Mitral Valve: The mitral valve is grossly normal. Mild mitral valve regurgitation.  Tricuspid Valve: The tricuspid valve is not well visualized. Tricuspid valve regurgitation is trivial.  Aortic Valve: The aortic valve was not well visualized. There is moderate calcification of the aortic valve. Aortic valve regurgitation is severe. Aortic regurgitation PHT measures 353 msec. Aortic valve sclerosis/calcification is present, without any evidence of aortic stenosis. Aortic valve mean gradient measures 6.0 mmHg. Aortic valve peak gradient measures 9.8 mmHg. Aortic valve area, by VTI measures 1.79 cm.  Pulmonic Valve: The pulmonic valve was not well visualized. Pulmonic valve regurgitation is trivial.  Aorta: The aortic root is normal in size and structure and the ascending aorta was not well visualized.  Venous: The inferior vena cava is normal in size with greater than 50% respiratory variability, suggesting right atrial pressure of 3 mmHg.  IAS/Shunts: The interatrial septum was not well visualized.   LEFT VENTRICLE PLAX 2D LVIDd:         5.20 cm   Diastology LVIDs:  4.40 cm   LV e' medial:   2.96 cm/s LV PW:         1.20 cm   LV E/e' medial: 5.1 LV IVS:        1.00 cm LVOT diam:     2.30 cm LV SV:         46 LV SV Index:   34 LVOT Area:     4.15 cm   RIGHT VENTRICLE RV Basal diam:  2.40 cm RV S prime:     23.40 cm/s TAPSE (M-mode): 2.7 cm  LEFT ATRIUM             Index        RIGHT ATRIUM           Index LA diam:        2.70 cm 1.97 cm/m   RA Area:     19.00 cm LA Vol (A2C):   36.6 ml 26.67 ml/m  RA Volume:   48.60 ml  35.41 ml/m LA Vol (A4C):   16.1 ml 11.73 ml/m LA Biplane Vol: 24.6 ml 17.92 ml/m AORTIC VALVE AV Area  (Vmax):    1.99 cm AV Area (Vmean):   1.85 cm AV Area (VTI):     1.79 cm AV Vmax:           156.41 cm/s AV Vmean:          116.818 cm/s AV VTI:            0.259 m AV Peak Grad:      9.8 mmHg AV Mean Grad:      6.0 mmHg LVOT Vmax:         74.73 cm/s LVOT Vmean:        52.133 cm/s LVOT VTI:          0.111 m LVOT/AV VTI ratio: 0.43 AI PHT:            353 msec  AORTA Ao Root diam: 3.50 cm Ao Asc diam:  3.90 cm  MV E velocity: 14.98 cm/s SHUNTS Systemic VTI:  0.11 m Systemic Diam: 2.30 cm  Weston Brass MD Electronically signed by Weston Brass MD Signature Date/Time: 03/04/2022/2:20:45 PM    Final              Physical Exam:   VS:  BP 138/64   Pulse 71   Ht 5' (1.524 m)   Wt 94 lb (42.6 kg)   BMI 18.36 kg/m    Wt Readings from Last 3 Encounters:  07/09/22 94 lb (42.6 kg)  05/13/22 92 lb 12.8 oz (42.1 kg)  04/09/22 95 lb (43.1 kg)    GEN: Well nourished, well developed in no acute distress NECK: No JVD; No carotid bruits CARDIAC: RRR, no murmurs, rubs, gallops RESPIRATORY:  Clear to auscultation without rales, wheezing or rhonchi  ABDOMEN: Soft, non-tender, non-distended EXTREMITIES:  No edema; No deformity   ASSESSMENT AND PLAN: .    Systolic heart failure: Echo on 2/24 indicated LVEF 30 to 35% (previous echo 05/2021 LVEF 60%) with mobile echodensity 0.7 x 0.7 cm in the right ventricular outflow tract. Today is euvolemic and well compensated on exam. She endorses some slight dyspnea on exertion, felt to be her baseline. Reviewed salt and fluid restriction. Discussed to notify the office of weight gain of 2-3lbs overnight or 5lbs in a week. Not on ARB/ARNi as her blood pressure is not likely to tolerate. Continue GDMT Farxiga 10mg  daily, furosemide 40mg  daily, Toprol 12.5mg  daily,  spironolactone 12.5mg  daily. Refills sent to her home pharmacy.   Wt Readings from Last 3 Encounters:  07/09/22 94 lb (42.6 kg)  05/13/22 92 lb 12.8 oz (42.1 kg)  04/09/22 95 lb  (43.1 kg)    Coronary artery calcification/HLD: Stable with no anginal symptoms. No indication for ischemic evaluation.  Last lipid panel 03/09/22 indicated LDL of 111 and cholesterol of 179. Reports abdominal pain and diarrhea with statins. Started Zetia 10 mg daily, states she took one dose and caused stomach pain. Declines further cholesterol treatment.   Severe AR: Not operative candidate. Already established with palliative care.   AAA/penetrating aortic ulcer- s/p endovascular repair: AAA duplex scheduled for 09/20/22. Follows with [facility].   S/p kyphoplasty: Reports ongoing back pain, has MRI scheduled that she feels she might cancel. Recommended she furhter discuss with her PCP.   RVOT mass- Off excess tricuspid valve, no clinical evidence of endocaditis.        Dispo: Follow up in 3-4 months with Dr. Duke Salvia or Gillian Shields, NP   Signed, Rip Harbour, NP

## 2022-07-09 ENCOUNTER — Ambulatory Visit (HOSPITAL_BASED_OUTPATIENT_CLINIC_OR_DEPARTMENT_OTHER): Payer: Medicare Other | Admitting: Cardiology

## 2022-07-09 ENCOUNTER — Encounter (HOSPITAL_BASED_OUTPATIENT_CLINIC_OR_DEPARTMENT_OTHER): Payer: Self-pay | Admitting: Cardiology

## 2022-07-09 VITALS — BP 138/64 | HR 71 | Ht 60.0 in | Wt 94.0 lb

## 2022-07-09 DIAGNOSIS — E785 Hyperlipidemia, unspecified: Secondary | ICD-10-CM

## 2022-07-09 DIAGNOSIS — I2510 Atherosclerotic heart disease of native coronary artery without angina pectoris: Secondary | ICD-10-CM | POA: Diagnosis not present

## 2022-07-09 DIAGNOSIS — I714 Abdominal aortic aneurysm, without rupture, unspecified: Secondary | ICD-10-CM

## 2022-07-09 DIAGNOSIS — I2584 Coronary atherosclerosis due to calcified coronary lesion: Secondary | ICD-10-CM

## 2022-07-09 DIAGNOSIS — I351 Nonrheumatic aortic (valve) insufficiency: Secondary | ICD-10-CM | POA: Diagnosis not present

## 2022-07-09 DIAGNOSIS — I5022 Chronic systolic (congestive) heart failure: Secondary | ICD-10-CM

## 2022-07-09 DIAGNOSIS — Z9889 Other specified postprocedural states: Secondary | ICD-10-CM

## 2022-07-09 MED ORDER — FUROSEMIDE 40 MG PO TABS
40.0000 mg | ORAL_TABLET | Freq: Every day | ORAL | 3 refills | Status: DC
Start: 2022-07-09 — End: 2023-10-13

## 2022-07-09 MED ORDER — METOPROLOL SUCCINATE ER 25 MG PO TB24
12.5000 mg | ORAL_TABLET | Freq: Every day | ORAL | 3 refills | Status: DC
Start: 2022-07-09 — End: 2023-03-21

## 2022-07-09 MED ORDER — DAPAGLIFLOZIN PROPANEDIOL 10 MG PO TABS
10.0000 mg | ORAL_TABLET | Freq: Every day | ORAL | 3 refills | Status: AC
Start: 2022-07-09 — End: ?

## 2022-07-09 MED ORDER — SPIRONOLACTONE 25 MG PO TABS
12.5000 mg | ORAL_TABLET | Freq: Every day | ORAL | 3 refills | Status: DC
Start: 2022-07-09 — End: 2023-04-22

## 2022-07-09 NOTE — Patient Instructions (Signed)
Medication Instructions:  Your physician recommends that you continue on your current medications as directed. Please refer to the Current Medication list given to you today.  *If you need a refill on your cardiac medications before your next appointment, please call your pharmacy*   Follow-Up: At [facility], you and your health needs are our priority.  As part of our continuing mission to provide you with exceptional heart care, we have created designated Provider Care Teams.  These Care Teams include your primary Cardiologist (physician) and Advanced Practice Providers (APPs -  Physician Assistants and Nurse Practitioners) who all work together to provide you with the care you need, when you need it.  We recommend signing up for the patient portal called "MyChart".  Sign up information is provided on this After Visit Summary.  MyChart is used to connect with patients for Virtual Visits (Telemedicine).  Patients are able to view lab/test results, encounter notes, upcoming appointments, etc.  Non-urgent messages can be sent to your provider as well.   To learn more about what you can do with MyChart, go to [email or web address].    Your next appointment:   3-4 months with Duke Salvia or Gillian Shields, NP

## 2022-07-17 DIAGNOSIS — J9601 Acute respiratory failure with hypoxia: Secondary | ICD-10-CM | POA: Diagnosis not present

## 2022-07-17 DIAGNOSIS — I509 Heart failure, unspecified: Secondary | ICD-10-CM | POA: Diagnosis not present

## 2022-07-17 DIAGNOSIS — J9611 Chronic respiratory failure with hypoxia: Secondary | ICD-10-CM | POA: Diagnosis not present

## 2022-07-29 ENCOUNTER — Other Ambulatory Visit: Payer: Medicare Other

## 2022-08-13 ENCOUNTER — Other Ambulatory Visit: Payer: Medicare Other

## 2022-08-17 DIAGNOSIS — J9601 Acute respiratory failure with hypoxia: Secondary | ICD-10-CM | POA: Diagnosis not present

## 2022-08-17 DIAGNOSIS — J9611 Chronic respiratory failure with hypoxia: Secondary | ICD-10-CM | POA: Diagnosis not present

## 2022-08-17 DIAGNOSIS — I509 Heart failure, unspecified: Secondary | ICD-10-CM | POA: Diagnosis not present

## 2022-08-31 DIAGNOSIS — I10 Essential (primary) hypertension: Secondary | ICD-10-CM | POA: Diagnosis not present

## 2022-08-31 DIAGNOSIS — J449 Chronic obstructive pulmonary disease, unspecified: Secondary | ICD-10-CM | POA: Diagnosis not present

## 2022-08-31 DIAGNOSIS — Z8679 Personal history of other diseases of the circulatory system: Secondary | ICD-10-CM | POA: Diagnosis not present

## 2022-08-31 DIAGNOSIS — Z1331 Encounter for screening for depression: Secondary | ICD-10-CM | POA: Diagnosis not present

## 2022-08-31 DIAGNOSIS — I700 Atherosclerosis of aorta: Secondary | ICD-10-CM | POA: Diagnosis not present

## 2022-08-31 DIAGNOSIS — Z86711 Personal history of pulmonary embolism: Secondary | ICD-10-CM | POA: Diagnosis not present

## 2022-08-31 DIAGNOSIS — Z0000 Encounter for general adult medical examination without abnormal findings: Secondary | ICD-10-CM | POA: Diagnosis not present

## 2022-09-01 LAB — LAB REPORT - SCANNED: EGFR: 83

## 2022-09-17 DIAGNOSIS — I509 Heart failure, unspecified: Secondary | ICD-10-CM | POA: Diagnosis not present

## 2022-09-17 DIAGNOSIS — J9601 Acute respiratory failure with hypoxia: Secondary | ICD-10-CM | POA: Diagnosis not present

## 2022-09-17 DIAGNOSIS — J9611 Chronic respiratory failure with hypoxia: Secondary | ICD-10-CM | POA: Diagnosis not present

## 2022-09-20 ENCOUNTER — Ambulatory Visit: Payer: Medicare Other | Admitting: Surgery

## 2022-09-20 ENCOUNTER — Encounter: Payer: Self-pay | Admitting: Surgery

## 2022-09-20 ENCOUNTER — Ambulatory Visit (HOSPITAL_COMMUNITY)
Admission: RE | Admit: 2022-09-20 | Discharge: 2022-09-20 | Disposition: A | Discharge status: Home / Self Care | Payer: Medicare Other | Source: Ambulatory Visit | Attending: Surgery | Admitting: Surgery

## 2022-09-20 VITALS — BP 144/74 | HR 58 | Temp 98.0°F | Resp 20 | Ht 60.0 in | Wt 90.0 lb

## 2022-09-20 DIAGNOSIS — I7143 Infrarenal abdominal aortic aneurysm, without rupture: Secondary | ICD-10-CM | POA: Diagnosis not present

## 2022-09-20 DIAGNOSIS — I7123 Aneurysm of the descending thoracic aorta, without rupture: Secondary | ICD-10-CM

## 2022-09-20 DIAGNOSIS — I712 Thoracic aortic aneurysm, without rupture, unspecified: Secondary | ICD-10-CM | POA: Insufficient documentation

## 2022-09-20 NOTE — Progress Notes (Signed)
[facility] [place]  Patient name: Stephanie Collier MRN: [id number] DOB: 22-Jun-1935 Sex: female   REASON FOR VISIT:    Follow-up  HISOTRY OF PRESENT ILLNESS:    Stephanie Collier is a 87 y.o. female who is back for follow-up.  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  Prior to her operation she suffered a viral illness which kept her in bed for 2 weeks.  She was found to have a DVT as well as PE.  She had a IVC filter placed in an effort to avoid anticoagulation.  Her filter was removed several weeks after her procedure.  She continues to take an ARB for her hypertension.  She has had compression fractures in her back, a knee fracture and a hip fracture since I last saw.  She has however made a great recovery.  She is here with her daughter who is dealing with recurrent breast cancer.  She denies any chest   PAST MEDICAL HISTORY:   Past Medical History:  Diagnosis Date   AAA (abdominal aortic aneurysm) (HCC)    Arthritis    hip   Hypertension    no meds now   Osteoporosis    Patellar fracture    left   Raynaud's disease    Scoliosis      FAMILY HISTORY:   Family History  Problem Relation Age of Onset   Breast cancer Daughter 19    SOCIAL HISTORY:   Social History   Tobacco Use   Smoking status: Never   Smokeless tobacco: Never  Substance Use Topics   Alcohol use: Not Currently    Comment: rare wine     ALLERGIES:   Allergies  Allergen Reactions   Fentanyl Shortness Of Breath   Acetaminophen Other (See Comments)    Constipation    Asa [Aspirin] Other (See Comments)    Per patient stomach cramps   Azithromycin Diarrhea   Calcium Carb-Magnesium Carb Other (See Comments)    skin lesions   Fosamax [Alendronate] Other (See Comments)     indigestion   Labetalol Hcl Other (See Comments)     indigestion     CURRENT MEDICATIONS:   Current Outpatient Medications  Medication Sig Dispense Refill   dapagliflozin propanediol (FARXIGA) 10 MG TABS tablet Take 1 tablet (10 mg total) by mouth daily. 90 tablet 3   furosemide (LASIX) 40 MG tablet Take 1 tablet (40 mg total) by mouth daily. And extra 40mg  as needed if shortness of breath, worsening edema, increase in weight >5lbs in 3 days 135 tablet 3   levothyroxine (SYNTHROID) 25 MCG tablet Take 1 tablet (25 mcg total) by mouth daily before breakfast. 30 tablet 3   metoprolol succinate (TOPROL-XL) 25 MG 24 hr tablet Take 0.5 tablets (12.5 mg total) by mouth daily. 45 tablet 3   Multiple Vitamin (MULTIVITAMIN PO) Take 1 tablet by mouth daily.     spironolactone (ALDACTONE) 25 MG tablet Take 0.5 tablets (12.5 mg total) by mouth daily. 45 tablet 3   No current facility-administered medications for this visit.    REVIEW OF SYSTEMS:   [X]  denotes positive finding, [ ]  denotes negative finding Cardiac  Comments:  Chest pain or chest pressure:    Shortness of breath upon exertion:    Short of breath when lying flat:    Irregular heart rhythm:  Vascular    Pain in calf, thigh, or hip brought on by ambulation:    Pain in feet at night that wakes you up from your sleep:     Blood clot in your veins:    Leg swelling:         Pulmonary    Oxygen at home:    Productive cough:     Wheezing:         Neurologic    Sudden weakness in arms or legs:     Sudden numbness in arms or legs:     Sudden onset of difficulty speaking or slurred speech:    Temporary loss of vision in one eye:     Problems with dizziness:         Gastrointestinal    Blood in stool:     Vomited blood:         Genitourinary    Burning when urinating:     Blood in urine:        Psychiatric    Major depression:         Hematologic    Bleeding problems:    Problems with blood clotting too  easily:        Skin    Rashes or ulcers:        Constitutional    Fever or chills:      PHYSICAL EXAM:   Vitals:   09/20/22 1009  BP: (!) 144/74  Pulse: (!) 58  Resp: 20  Temp: 98 F (36.7 C)  SpO2: 97%  Weight: 90 lb (40.8 kg)  Height: 5' (1.524 m)    GENERAL: The patient is a well-nourished female, in no acute distress. The vital signs are documented above. CARDIAC: There is a regular rate and rhythm.  PULMONARY: Non-labored respirations MUSCULOSKELETAL: There are no major deformities or cyanosis. NEUROLOGIC: No focal weakness or paresthesias are detected. SKIN: There are no ulcers or rashes noted. PSYCHIATRIC: The patient has a normal affect.  STUDIES:   I have reviewed the below CT scan from 03/03/2022 1. No acute noncontrast CT abnormality is seen in the abdomen or pelvis. 2. Cardiomegaly with small chronic pericardial effusion. 3. Aortic and coronary artery atherosclerosis. 4. Extensive aortoiliac calcifications and stable 4.4 x 4.5 cm fusiform distal infrarenal AAA. Possible that the aneurysm could be palpated as a mass because it is in between the abdominal wall and spine. Recommend follow-up every 6 months and vascular consultation. Reference: J Am Coll Radiol 2013;10:789-794. 5. 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 6. Osteopenia, scoliosis and degenerative change. 7. Progressive T8 compression fracture with about 30% height loss. 8. L3 and L4 moderate compression fractures have been treated with kyphoplasty in the interval. No increased height loss at either level.   AAA U/S Abdominal Aorta: There is evidence of abnormal dilatation of the distal  Abdominal aorta. There is evidence of abnormal dilation of the Right  Common Iliac artery and Left Common Iliac artery. The largest aortic  measurement is 4.9 cm. The largest aortic  diameter has increased compared to prior exam. Previous diameter   measurement was 4.5 cm obtained on 03/03/22.   MEDICAL ISSUES:   TAAA: Graft is in good position with resolution of pathology.  I will repeat this CT scan in 6 months   AAA: Maximum aortic diameter is 4.9 cm.  I think there is some discrepancy between CT scan and ultrasound.  Therefore, I am going to get  a CT scan in 6 months.  We did talk about the possibility of proceeding with surgery.  She is a little reluctant but was willing to consider if this can be done under local anesthesia   Durene Cal, IV, MD, FACS [facility] [phone] Pager [phone]

## 2022-10-15 ENCOUNTER — Encounter (HOSPITAL_BASED_OUTPATIENT_CLINIC_OR_DEPARTMENT_OTHER): Payer: Self-pay | Admitting: Family

## 2022-10-15 ENCOUNTER — Ambulatory Visit (HOSPITAL_BASED_OUTPATIENT_CLINIC_OR_DEPARTMENT_OTHER): Payer: Medicare Other | Admitting: Family

## 2022-10-15 VITALS — BP 130/82 | HR 63 | Ht 60.0 in | Wt 92.2 lb

## 2022-10-15 DIAGNOSIS — I2510 Atherosclerotic heart disease of native coronary artery without angina pectoris: Secondary | ICD-10-CM

## 2022-10-15 DIAGNOSIS — I5022 Chronic systolic (congestive) heart failure: Secondary | ICD-10-CM

## 2022-10-15 DIAGNOSIS — E785 Hyperlipidemia, unspecified: Secondary | ICD-10-CM

## 2022-10-15 DIAGNOSIS — I351 Nonrheumatic aortic (valve) insufficiency: Secondary | ICD-10-CM | POA: Diagnosis not present

## 2022-10-15 DIAGNOSIS — E039 Hypothyroidism, unspecified: Secondary | ICD-10-CM

## 2022-10-15 DIAGNOSIS — Z9889 Other specified postprocedural states: Secondary | ICD-10-CM | POA: Diagnosis not present

## 2022-10-15 NOTE — Progress Notes (Signed)
Cardiology Office Note:  .   Date:  10/15/2022  ID:  Stephanie Collier, DOB 01/11/1936, MRN [id number] PCP: Lorenda Ishihara, MD  [facility] Providers Cardiologist:  Chilton Si, MD    History of Present Illness: .   Stephanie Collier is a 87 y.o. female with a hx of chronic systolic and diastolic heart failure (LVEF 30 to 35%), severe aortic regurgitation, mild ascending aortic aneurysm, 4.5 cm infrarenal AAA, thoracic aorta penetrating ulcer, hypertension.   Admitted 1/27-02/12/22 with compression fracture of T5-T7 and underwent cardiac arthroplasty L3 and L4.   Admitted 2/21-2/29/24 with new onset systolic heart failure LVEF 30 to 35% (previous echo 05/2021 LVEF 60%) with mobile echodensity 0.7X 0.7 cm in the right ventricular outflow tract.  Lower extremity duplex negative for DVT.  Patient politely declined cardiac catheterization and elected for less invasive course.  Diuresed with IV Lasix and discharged on p.o. Lasix.  New onset hypothyroidism and was started on Synthroid.   Seen 03/2022 with intolerance rosuvastatin was started on pravastatin. She declined palliative care services when they contacted her 03/25/22.  Lasting 07/09/2022 doing well with no changes made at that time.   She presents today for follow up with her daughter.  Tries to stay active around her home by dancing. Reports no shortness of breath nor dyspnea on exertion. Reports no chest pain, pressure, or tightness. No edema, orthopnea, PND. Reports no palpitations. Reports low energy level is overall unchanged from prior.  Following heart healthy diet, daughter prepares her meals.  ROS: Please see the history of present illness.    All other systems reviewed and are negative.   Studies Reviewed: .        Cardiac Studies & Procedures       ECHOCARDIOGRAM  ECHOCARDIOGRAM COMPLETE 03/04/2022  Narrative ECHOCARDIOGRAM REPORT    Patient Name:   Stephanie Collier Date of Exam: 03/04/2022 Medical Rec #:  [id number]   Height:       60.0 in Accession #:    [id number] Weight:       97.0 lb Date of Birth:  1935/06/24  BSA:          1.372 m Patient Age:    86 years   BP:           157/75 mmHg Patient Gender: F          HR:           86 bpm. Exam Location:  Inpatient  Procedure: 2D Echo, Cardiac Doppler and Color Doppler  Indications:    I50.21 CHF  History:        Patient has prior history of Echocardiogram examinations, most recent 06/04/2021. CHF, COPD and History of thoracic aortic aneurysm history of stenting of aorta, Signs/Symptoms:Murmur; Risk Factors:Hypertension.  Sonographer:    Chanetta Marshall [facility], RDCS Referring Phys: [id number] [facility]   Sonographer Comments: Image difficulty due to kyphoscoliosis and patient unable to lay flat IMPRESSIONS   1. Independently mobile echodensity 0.7 x 0.7 cm, which appears to be in the right ventricular outflow tract based on parasternal imaging. Image 17-19 best demonstrate, not well seen elsewhere. Consider TEE if clinically relevant. 2. The aortic valve was not well visualized. There is moderate calcification of the aortic valve. Aortic valve regurgitation is severe. Aortic valve sclerosis/calcification is present, without any evidence of aortic stenosis. Stroke volume index 34 ml/m2 and DVI 0.43. 3. Left ventricular ejection fraction, by estimation, is 30 to 35%. The left ventricle has moderately decreased function. The  left ventricle demonstrates regional wall motion abnormalities (see scoring diagram/findings for description). There is mild left ventricular hypertrophy. Left ventricular diastolic parameters are indeterminate. 4. Right ventricular systolic function is normal. The right ventricular size is normal. Tricuspid regurgitation signal is inadequate for assessing PA pressure. 5. The mitral valve is grossly normal. Mild mitral valve regurgitation. 6. The inferior vena cava is normal in size with greater than 50% respiratory variability,  suggesting right atrial pressure of 3 mmHg.  Comparison(s): A prior study was performed on 06/04/21. Prior images reviewed side by side. Aortic valve regurgitation appears similar. LV function has decreased.  FINDINGS Left Ventricle: Left ventricular ejection fraction, by estimation, is 30 to 35%. The left ventricle has moderately decreased function. The left ventricle demonstrates regional wall motion abnormalities. The left ventricular internal cavity size was normal in size. There is mild left ventricular hypertrophy. Left ventricular diastolic parameters are indeterminate.  Right Ventricle: The right ventricular size is normal. Right vetricular wall thickness was not well visualized. Right ventricular systolic function is normal. Tricuspid regurgitation signal is inadequate for assessing PA pressure.  Left Atrium: Left atrial size was normal in size.  Right Atrium: Right atrial size was normal in size.  Pericardium: There is no evidence of pericardial effusion.  Mitral Valve: The mitral valve is grossly normal. Mild mitral valve regurgitation.  Tricuspid Valve: The tricuspid valve is not well visualized. Tricuspid valve regurgitation is trivial.  Aortic Valve: The aortic valve was not well visualized. There is moderate calcification of the aortic valve. Aortic valve regurgitation is severe. Aortic regurgitation PHT measures 353 msec. Aortic valve sclerosis/calcification is present, without any evidence of aortic stenosis. Aortic valve mean gradient measures 6.0 mmHg. Aortic valve peak gradient measures 9.8 mmHg. Aortic valve area, by VTI measures 1.79 cm.  Pulmonic Valve: The pulmonic valve was not well visualized. Pulmonic valve regurgitation is trivial.  Aorta: The aortic root is normal in size and structure and the ascending aorta was not well visualized.  Venous: The inferior vena cava is normal in size with greater than 50% respiratory variability, suggesting right atrial pressure  of 3 mmHg.  IAS/Shunts: The interatrial septum was not well visualized.   LEFT VENTRICLE PLAX 2D LVIDd:         5.20 cm   Diastology LVIDs:         4.40 cm   LV e' medial:   2.96 cm/s LV PW:         1.20 cm   LV E/e' medial: 5.1 LV IVS:        1.00 cm LVOT diam:     2.30 cm LV SV:         46 LV SV Index:   34 LVOT Area:     4.15 cm   RIGHT VENTRICLE RV Basal diam:  2.40 cm RV S prime:     23.40 cm/s TAPSE (M-mode): 2.7 cm  LEFT ATRIUM             Index        RIGHT ATRIUM           Index LA diam:        2.70 cm 1.97 cm/m   RA Area:     19.00 cm LA Vol (A2C):   36.6 ml 26.67 ml/m  RA Volume:   48.60 ml  35.41 ml/m LA Vol (A4C):   16.1 ml 11.73 ml/m LA Biplane Vol: 24.6 ml 17.92 ml/m AORTIC VALVE AV Area (Vmax):  1.99 cm AV Area (Vmean):   1.85 cm AV Area (VTI):     1.79 cm AV Vmax:           156.41 cm/s AV Vmean:          116.818 cm/s AV VTI:            0.259 m AV Peak Grad:      9.8 mmHg AV Mean Grad:      6.0 mmHg LVOT Vmax:         74.73 cm/s LVOT Vmean:        52.133 cm/s LVOT VTI:          0.111 m LVOT/AV VTI ratio: 0.43 AI PHT:            353 msec  AORTA Ao Root diam: 3.50 cm Ao Asc diam:  3.90 cm  MV E velocity: 14.98 cm/s SHUNTS Systemic VTI:  0.11 m Systemic Diam: 2.30 cm  Weston Brass MD Electronically signed by Weston Brass MD Signature Date/Time: 03/04/2022/2:20:45 PM    Final             Risk Assessment/Calculations:             Physical Exam:   VS:  BP 130/82   Pulse 63   Ht 5' (1.524 m)   Wt 92 lb 3.2 oz (41.8 kg)   BMI 18.01 kg/m    Wt Readings from Last 3 Encounters:  10/15/22 92 lb 3.2 oz (41.8 kg)  09/20/22 90 lb (40.8 kg)  07/09/22 94 lb (42.6 kg)    Vitals:   10/15/22 1517 10/15/22 2053  BP: (!) 173/76 130/82  Pulse: 63   Height: 5' (1.524 m)   Weight: 92 lb 3.2 oz (41.8 kg)   BMI (Calculated): 18.01     GEN: Well nourished, well developed in no acute distress NECK: No JVD; No carotid  bruits CARDIAC: RRR, no rubs, gallops.  Systolic murmur noted. RESPIRATORY:  Clear to auscultation without rales, wheezing or rhonchi  ABDOMEN: Soft, non-tender, non-distended EXTREMITIES:  No edema; No deformity   ASSESSMENT AND PLAN: .   Systolic heart failure-update echo 02/2023 ordered today.  Euvolemic and well compensated on exam.  Continue GDMT Farxiga 10 mg daily, furosemide 40 mg daily, Toprol 12.5 mg daily, spironolactone 12.5 mg daily.  Low suspicion BP would tolerate ARB/ARNI.  She has patient assistance for Marcelline Deist and will contact her PCP for renewal.  If difficulties renewing, she will contact our office.  Low sodium diet, fluid restriction <2L, and daily weights encouraged. Educated to contact our office for weight gain of 2 lbs overnight or 5 lbs in one week.   Coronary artery calcification/hyperlipidemia- Stable with no anginal symptoms. No indication for ischemic evaluation.  Prior intolerance rosuvastatin, pravastatin, Zetia.  Has declined additional lipid-lowering therapy.   Severe AR-would recommend avoidance of bradycardia.  She is not an operative candidate.   AAA/penetrating aortic ulcer-s/p endovascular repair.  Continue to follow with VVS.   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  Clinically no evidence of endocarditis.  No further evaluation at this time.  Hypothyroidism - Update thyroid panel per her request. Levothyroxine per PCP.          Dispo: follow up after 02/2023 echo  Signed, Alver Sorrow, NP

## 2022-10-15 NOTE — Patient Instructions (Addendum)
Medication Instructions:  Continue your current medications.   *If you need a refill on your cardiac medications before your next appointment, please call your pharmacy*  Testing:   Your physician has requested that you have an echocardiogram February 2025. Echocardiography is a painless test that uses sound waves to create images of your heart. It provides your doctor with information about the size and shape of your heart and how well your heart's chambers and valves are working. This procedure takes approximately one hour. There are no restrictions for this procedure. Please do NOT wear cologne, perfume, aftershave, or lotions (deodorant is allowed). Please arrive 15 minutes prior to your appointment time.  Lab Work: Your physician recommends that you return for lab work today: thyroid panel  If you have labs (blood work) drawn today and your tests are completely normal, you will receive your results only by: MyChart Message (if you have MyChart) OR A paper copy in the mail If you have any lab test that is abnormal or we need to change your treatment, we will call you to review the results.  Follow-Up: At [facility], you and your health needs are our priority.  As part of our continuing mission to provide you with exceptional heart care, we have created designated Provider Care Teams.  These Care Teams include your primary Cardiologist (physician) and Advanced Practice Providers (APPs -  Physician Assistants and Nurse Practitioners) who all work together to provide you with the care you need, when you need it.  We recommend signing up for the patient portal called "MyChart".  Sign up information is provided on this After Visit Summary.  MyChart is used to connect with patients for Virtual Visits (Telemedicine).  Patients are able to view lab/test results, encounter notes, upcoming appointments, etc.  Non-urgent messages can be sent to your provider as well.   To learn more about  what you can do with MyChart, go to [email or web address].    Your next appointment:   After February echocardiogram

## 2022-10-16 LAB — THYROID PANEL WITH TSH
Free Thyroxine Index: 2.3 (ref 1.2–4.9)
T3 Uptake Ratio: 24 % (ref 24–39)
T4, Total: 9.6 ug/dL (ref 4.5–12.0)
TSH: 2.42 u[IU]/mL (ref 0.450–4.500)

## 2022-10-17 DIAGNOSIS — J9601 Acute respiratory failure with hypoxia: Secondary | ICD-10-CM | POA: Diagnosis not present

## 2022-10-17 DIAGNOSIS — I509 Heart failure, unspecified: Secondary | ICD-10-CM | POA: Diagnosis not present

## 2022-10-17 DIAGNOSIS — J9611 Chronic respiratory failure with hypoxia: Secondary | ICD-10-CM | POA: Diagnosis not present

## 2022-10-29 ENCOUNTER — Telehealth: Payer: Self-pay | Admitting: Cardiovascular Disease

## 2022-10-29 NOTE — Telephone Encounter (Signed)
Pt states she needs to speak with the nurse. Please advise

## 2022-10-29 NOTE — Telephone Encounter (Signed)
Returned call to patient,   Patient is concerned about her levothyroxine making her osteoporosis worse, advised her she would need to speak with her PCP regarding those concerns.

## 2022-11-02 DIAGNOSIS — E039 Hypothyroidism, unspecified: Secondary | ICD-10-CM | POA: Diagnosis not present

## 2022-11-02 DIAGNOSIS — I719 Aortic aneurysm of unspecified site, without rupture: Secondary | ICD-10-CM | POA: Diagnosis not present

## 2022-11-02 DIAGNOSIS — I700 Atherosclerosis of aorta: Secondary | ICD-10-CM | POA: Diagnosis not present

## 2022-11-02 DIAGNOSIS — E46 Unspecified protein-calorie malnutrition: Secondary | ICD-10-CM | POA: Diagnosis not present

## 2022-11-17 DIAGNOSIS — J9601 Acute respiratory failure with hypoxia: Secondary | ICD-10-CM | POA: Diagnosis not present

## 2022-11-17 DIAGNOSIS — I509 Heart failure, unspecified: Secondary | ICD-10-CM | POA: Diagnosis not present

## 2022-11-17 DIAGNOSIS — J9611 Chronic respiratory failure with hypoxia: Secondary | ICD-10-CM | POA: Diagnosis not present

## 2022-12-17 DIAGNOSIS — J9611 Chronic respiratory failure with hypoxia: Secondary | ICD-10-CM | POA: Diagnosis not present

## 2022-12-17 DIAGNOSIS — J9601 Acute respiratory failure with hypoxia: Secondary | ICD-10-CM | POA: Diagnosis not present

## 2022-12-17 DIAGNOSIS — I509 Heart failure, unspecified: Secondary | ICD-10-CM | POA: Diagnosis not present

## 2023-01-17 DIAGNOSIS — J9601 Acute respiratory failure with hypoxia: Secondary | ICD-10-CM | POA: Diagnosis not present

## 2023-01-17 DIAGNOSIS — J9611 Chronic respiratory failure with hypoxia: Secondary | ICD-10-CM | POA: Diagnosis not present

## 2023-01-17 DIAGNOSIS — I509 Heart failure, unspecified: Secondary | ICD-10-CM | POA: Diagnosis not present

## 2023-02-17 DIAGNOSIS — I509 Heart failure, unspecified: Secondary | ICD-10-CM | POA: Diagnosis not present

## 2023-02-17 DIAGNOSIS — J9611 Chronic respiratory failure with hypoxia: Secondary | ICD-10-CM | POA: Diagnosis not present

## 2023-02-17 DIAGNOSIS — J9601 Acute respiratory failure with hypoxia: Secondary | ICD-10-CM | POA: Diagnosis not present

## 2023-02-18 ENCOUNTER — Other Ambulatory Visit (HOSPITAL_BASED_OUTPATIENT_CLINIC_OR_DEPARTMENT_OTHER): Payer: Medicare Other

## 2023-02-21 ENCOUNTER — Other Ambulatory Visit: Payer: Self-pay

## 2023-02-21 DIAGNOSIS — I7123 Aneurysm of the descending thoracic aorta, without rupture: Secondary | ICD-10-CM

## 2023-02-22 ENCOUNTER — Encounter: Payer: Self-pay | Admitting: Dentist

## 2023-02-25 ENCOUNTER — Encounter (HOSPITAL_BASED_OUTPATIENT_CLINIC_OR_DEPARTMENT_OTHER): Payer: Self-pay | Admitting: Family

## 2023-02-25 ENCOUNTER — Ambulatory Visit (HOSPITAL_BASED_OUTPATIENT_CLINIC_OR_DEPARTMENT_OTHER): Payer: Medicare Other | Admitting: Family

## 2023-02-25 VITALS — BP 124/70 | HR 74 | Ht 60.0 in | Wt 94.9 lb

## 2023-02-25 DIAGNOSIS — I2510 Atherosclerotic heart disease of native coronary artery without angina pectoris: Secondary | ICD-10-CM

## 2023-02-25 DIAGNOSIS — M25552 Pain in left hip: Secondary | ICD-10-CM

## 2023-02-25 DIAGNOSIS — E785 Hyperlipidemia, unspecified: Secondary | ICD-10-CM | POA: Diagnosis not present

## 2023-02-25 DIAGNOSIS — I10 Essential (primary) hypertension: Secondary | ICD-10-CM

## 2023-02-25 DIAGNOSIS — I5022 Chronic systolic (congestive) heart failure: Secondary | ICD-10-CM | POA: Diagnosis not present

## 2023-02-25 DIAGNOSIS — M549 Dorsalgia, unspecified: Secondary | ICD-10-CM

## 2023-02-25 NOTE — Progress Notes (Signed)
Cardiology Office Note:  .   Date:  02/25/2023  ID:  West Carbo, DOB [birth year], MRN [id number] PCP: Lorenda Ishihara, MD  [facility] Providers Cardiologist:  Chilton Si, MD    History of Present Illness: .   Stephanie Collier is a 88 y.o. female with a hx of chronic systolic and diastolic heart failure (LVEF 30 to 35%), severe aortic regurgitation, mild ascending aortic aneurysm, 4.5 cm infrarenal AAA, thoracic aorta penetrating ulcer, hypertension.   Admitted 1/27-02/12/22 with compression fracture of T5-T7 and underwent cardiac arthroplasty L3 and L4.   Admitted 2/21-2/29/24 with new onset systolic heart failure LVEF 30 to 35% (previous echo 05/2021 LVEF 60%) with mobile echodensity 0.7X 0.7 cm in the right ventricular outflow tract.  Lower extremity duplex negative for DVT.  Patient politely declined cardiac catheterization and elected for less invasive course.  Diuresed with IV Lasix and discharged on p.o. Lasix.  New onset hypothyroidism and was started on Synthroid.   Seen 03/2022 with intolerance rosuvastatin was started on pravastatin. She declined palliative care services when they contacted her 03/25/22.  Lasting 07/09/2022 doing well with no changes made at that time.  Last seen 10/15/2022.  She was euvolemic on exam and recommended for repeat echo 02/2023 for monitoring of LVEF.   She presents today for follow up with her daughter.  She has not had a repeat echo as reports more issues with her left hip  (prior surgery) and back (compression fractures) and did not feel comfortable laying. She does not routinely follow with orthopedics. Notes an exacerbation of pain over the last 2 weeks. Not as active recently, reports not feeling as motivated to exercise. Reports no shortness of breath nor dyspnea on exertion. Reports no chest pain, pressure, or tightness. No edema, orthopnea, PND. Reports no palpitations. Following heart healthy diet, daughter prepares her meals. She inquires if  she has Lorinda Creed, discussed her symptoms are not consistent with this diagnosis. She notes some fatigue which she attributes to her Metoprolol. She reports when she took it at night she had poor dreams. She is also concerned Metoprolol is contributing to memory issues and word finding. Discussed this would be atypical side effects of Metoprolol.   ROS: Please see the history of present illness.    All other systems reviewed and are negative.   Studies Reviewed: Marland Kitchen   EKG Interpretation Date/Time:  Friday February 25 2023 15:29:03 EST Ventricular Rate:  74 PR Interval:  166 QRS Duration:  106 QT Interval:  406 QTC Calculation: 450 R Axis:   -64  Text Interpretation: Normal sinus rhythm with sinus arrhythmia Left anterior fascicular block Artifact noted limiting interpretation. No acute ST/T wave changes. Confirmed by Gillian Shields ([id number]) on 02/25/2023 3:33:03 PM       Risk Assessment/Calculations:             Physical Exam:   VS:  BP 124/70 (BP Location: Left Arm, Patient Position: Sitting, Cuff Size: Small)   Pulse 74   Ht 5' (1.524 m)   Wt 94 lb 14.4 oz (43 kg)   SpO2 96%   BMI 18.53 kg/m    Wt Readings from Last 3 Encounters:  02/25/23 94 lb 14.4 oz (43 kg)  10/15/22 92 lb 3.2 oz (41.8 kg)  09/20/22 90 lb (40.8 kg)    Vitals:   02/25/23 1516  BP: 124/70  Pulse: 74  Height: 5' (1.524 m)  Weight: 94 lb 14.4 oz (43 kg)  SpO2: 96%  BMI (Calculated):  18.53     GEN: Well nourished, well developed in no acute distress NECK: No JVD; No carotid bruits CARDIAC: RRR, no rubs, gallops.  Systolic murmur noted. RESPIRATORY:  Clear to auscultation without rales, wheezing or rhonchi  ABDOMEN: Soft, non-tender, non-distended EXTREMITIES:  No edema; No deformity   ASSESSMENT AND PLAN: .    Back pain and hip pain - Prior kyphoplasty 02/10/22 in IR. Prior L intramedullary nail intertrochantric with Dr. Sherilyn Dacosta 06/04/21. Notes persistent back and hip pain. Will refer to  orthopedics Dr. Christell Constant to establish care.   Systolic heart failure- We reviewed systolic heart failure pathophysiology and treatment plan extensively. Due for updated echo to reassess LVEF, given back and hip pain will await improvement in pain prior to completing as she will have to lay for an hour. Euvolemic and well compensated on exam.  Continue GDMT Farxiga 10 mg daily, furosemide 40 mg daily, Toprol 12.5 mg daily, spironolactone 12.5 mg daily.  Hold Metoprolol for 2 weeks and call for update - low suspicion it is contributory to her fatigue at low dose of 12.5mg  daily. If energy level improves, consider Nebivolol 2.5mg  daily as alternative.Low suspicion BP would tolerate ARB/ARNI.   Low sodium diet, fluid restriction <2L, and daily weights encouraged. Educated to contact our office for weight gain of 2 lbs overnight or 5 lbs in one week.   Coronary artery calcification/hyperlipidemia- Stable with no anginal symptoms. No indication for ischemic evaluation.  Prior intolerance rosuvastatin, pravastatin, Zetia.  Has declined additional lipid-lowering therapy.   Severe AR-would recommend avoidance of bradycardia.  She is not an operative candidate.   AAA/penetrating aortic ulcer-s/p endovascular repair.  Continue to follow with VVS.   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  Clinically no evidence of endocarditis.  No further evaluation at this time.  Hypothyroidism -  Levothyroxine per PCP.          Dispo: follow up in 6 months with Dr. Duke Salvia or APP  Signed, Alver Sorrow, NP

## 2023-02-25 NOTE — Patient Instructions (Signed)
Medication Instructions:  HOLD Metoprolol for 2 weeks.  Please call us in 2 weeks to let us know how your energy level is doing.   *If you need a refill on your cardiac medications before your next appointment, please call your pharmacy*   Lab Work: Your physician recommends that you return for lab work today: BMP, CBC  If you have labs (blood work) drawn today and your tests are completely normal, you will receive your results only by: MyChart Message (if you have MyChart) OR A paper copy in the mail If you have any lab test that is abnormal or we need to change your treatment, we will call you to review the results.   Follow-Up: At [facility], you and your health needs are our priority.  As part of our continuing mission to provide you with exceptional heart care, we have created designated Provider Care Teams.  These Care Teams include your primary Cardiologist (physician) and Advanced Practice Providers (APPs -  Physician Assistants and Nurse Practitioners) who all work together to provide you with the care you need, when you need it.  We recommend signing up for the patient portal called "MyChart".  Sign up information is provided on this After Visit Summary.  MyChart is used to connect with patients for Virtual Visits (Telemedicine).  Patients are able to view lab/test results, encounter notes, upcoming appointments, etc.  Non-urgent messages can be sent to your provider as well.   To learn more about what you can do with MyChart, go to [email or web address].    Your next appointment:   6 month(s)  Provider:   Chilton Si, MD or Alver Sorrow, NP   Other Instructions  Please schedule your echocardiogram at your convenience, okay to wait until your back and hip are feeling better

## 2023-03-11 ENCOUNTER — Encounter (HOSPITAL_BASED_OUTPATIENT_CLINIC_OR_DEPARTMENT_OTHER): Payer: Self-pay

## 2023-03-11 ENCOUNTER — Ambulatory Visit: Payer: Medicare Other | Admitting: Family Medicine

## 2023-03-11 ENCOUNTER — Ambulatory Visit (HOSPITAL_BASED_OUTPATIENT_CLINIC_OR_DEPARTMENT_OTHER): Payer: Medicare Other

## 2023-03-11 DIAGNOSIS — I5022 Chronic systolic (congestive) heart failure: Secondary | ICD-10-CM | POA: Diagnosis not present

## 2023-03-11 DIAGNOSIS — I10 Essential (primary) hypertension: Secondary | ICD-10-CM | POA: Diagnosis not present

## 2023-03-11 DIAGNOSIS — I351 Nonrheumatic aortic (valve) insufficiency: Secondary | ICD-10-CM | POA: Diagnosis not present

## 2023-03-12 ENCOUNTER — Encounter (HOSPITAL_BASED_OUTPATIENT_CLINIC_OR_DEPARTMENT_OTHER): Payer: Self-pay

## 2023-03-12 DIAGNOSIS — E785 Hyperlipidemia, unspecified: Secondary | ICD-10-CM

## 2023-03-12 DIAGNOSIS — Z9889 Other specified postprocedural states: Secondary | ICD-10-CM

## 2023-03-12 DIAGNOSIS — I5022 Chronic systolic (congestive) heart failure: Secondary | ICD-10-CM

## 2023-03-12 DIAGNOSIS — I714 Abdominal aortic aneurysm, without rupture, unspecified: Secondary | ICD-10-CM

## 2023-03-12 DIAGNOSIS — I351 Nonrheumatic aortic (valve) insufficiency: Secondary | ICD-10-CM

## 2023-03-12 DIAGNOSIS — I2510 Atherosclerotic heart disease of native coronary artery without angina pectoris: Secondary | ICD-10-CM

## 2023-03-12 LAB — BASIC METABOLIC PANEL
BUN/Creatinine Ratio: 28 (ref 12–28)
BUN: 21 mg/dL (ref 8–27)
CO2: 27 mmol/L (ref 20–29)
Calcium: 9.5 mg/dL (ref 8.7–10.3)
Chloride: 97 mmol/L (ref 96–106)
Creatinine, Ser: 0.76 mg/dL (ref 0.57–1.00)
Glucose: 73 mg/dL (ref 70–99)
Potassium: 3.3 mmol/L — ABNORMAL LOW (ref 3.5–5.2)
Sodium: 140 mmol/L (ref 134–144)
eGFR: 77 mL/min/{1.73_m2} (ref >59)

## 2023-03-12 LAB — CBC
Hematocrit: 43 % (ref 34.0–46.6)
Hemoglobin: 14.4 g/dL (ref 11.1–15.9)
MCH: 32.4 pg (ref 26.6–33.0)
MCHC: 33.5 g/dL (ref 31.5–35.7)
MCV: 97 fL (ref 79–97)
Platelets: 167 10*3/uL (ref 150–450)
RBC: 4.44 x10E6/uL (ref 3.77–5.28)
RDW: 12.4 % (ref 11.7–15.4)
WBC: 5.3 10*3/uL (ref 3.4–10.8)

## 2023-03-14 ENCOUNTER — Encounter (HOSPITAL_BASED_OUTPATIENT_CLINIC_OR_DEPARTMENT_OTHER): Payer: Self-pay

## 2023-03-14 LAB — ECHOCARDIOGRAM COMPLETE
AR max vel: 2.11 cm2
AV Area VTI: 2.36 cm2
AV Area mean vel: 2.38 cm2
AV Mean grad: 5 mmHg
AV Peak grad: 11.6 mmHg
AV Vena cont: 1.06 cm
Ao pk vel: 1.71 m/s
Area-P 1/2: 2.85 cm2
P 1/2 time: 553 ms
S' Lateral: 3.17 cm

## 2023-03-17 DIAGNOSIS — I509 Heart failure, unspecified: Secondary | ICD-10-CM | POA: Diagnosis not present

## 2023-03-17 DIAGNOSIS — J9611 Chronic respiratory failure with hypoxia: Secondary | ICD-10-CM | POA: Diagnosis not present

## 2023-03-18 ENCOUNTER — Ambulatory Visit
Admission: RE | Admit: 2023-03-18 | Discharge: 2023-03-18 | Disposition: A | Discharge status: Home / Self Care | Payer: Medicare Other | Source: Ambulatory Visit | Attending: Surgery | Admitting: Surgery

## 2023-03-18 DIAGNOSIS — I10 Essential (primary) hypertension: Secondary | ICD-10-CM | POA: Diagnosis not present

## 2023-03-18 DIAGNOSIS — I7123 Aneurysm of the descending thoracic aorta, without rupture: Secondary | ICD-10-CM

## 2023-03-18 DIAGNOSIS — I739 Peripheral vascular disease, unspecified: Secondary | ICD-10-CM | POA: Diagnosis not present

## 2023-03-18 DIAGNOSIS — I700 Atherosclerosis of aorta: Secondary | ICD-10-CM | POA: Diagnosis not present

## 2023-03-18 MED ORDER — IOPAMIDOL (ISOVUE-370) INJECTION 76%
100.0000 mL | Freq: Once | INTRAVENOUS | Status: AC | PRN
  Administered 2023-03-18: 100 mL via INTRAVENOUS

## 2023-03-21 ENCOUNTER — Telehealth: Payer: Self-pay | Admitting: Cardiovascular Disease

## 2023-03-21 ENCOUNTER — Ambulatory Visit: Payer: Medicare Other | Admitting: Surgery

## 2023-03-21 DIAGNOSIS — I5022 Chronic systolic (congestive) heart failure: Secondary | ICD-10-CM

## 2023-03-21 NOTE — Telephone Encounter (Signed)
 Pt upset due to not seeing Dr Duke Salvia and only seeing Luther Parody and is requesting cb from Dr Duke Salvia. Would not give any details

## 2023-03-21 NOTE — Telephone Encounter (Signed)
 Called and spoke to patient. DOB verified.  Patient's concerns:  - upset that she has not seen Dr. Duke Salvia for almost a year; met in hospital back in early 2024.   - upset that Dr. Duke Salvia did not read her echo; pt stated, "It's the least she could've done."  - needs medication clarification d/t recent echo results stating to continue current medication therapy.  - pt has not taken her metoprolol since 2/14.  - unhappy with recent lab work numbers; advised to increase Cleda Daub to one tablet, slept for a few hours and did like how she felt, has been taking 1/2 tablet of Cleda Daub ever since 3/1.  Nurse's Recommendations:  - encouraged pt to call late June/early July to schedule 6 month appt with Dr. Duke Salvia.  - informed pt that recall has been placed to schedule pt with Dr. Duke Salvia only.  - repeat lab work: BMP. Lap slips mailed to pt. address verified. - encouraged to eat potassium rich food prior to lab work. - informed pt that Dr. Duke Salvia did not read echo because she did not order echo.  Patient verbalized understanding.

## 2023-03-28 ENCOUNTER — Ambulatory Visit: Payer: Medicare Other | Admitting: Surgery

## 2023-03-28 ENCOUNTER — Encounter: Payer: Self-pay | Admitting: Surgery

## 2023-03-28 VITALS — BP 147/71 | HR 64 | Ht 60.0 in | Wt 95.0 lb

## 2023-03-28 DIAGNOSIS — I7143 Infrarenal abdominal aortic aneurysm, without rupture: Secondary | ICD-10-CM

## 2023-03-28 DIAGNOSIS — I7123 Aneurysm of the descending thoracic aorta, without rupture: Secondary | ICD-10-CM

## 2023-03-28 NOTE — Progress Notes (Signed)
 [facility] [place]  Patient name: Stephanie Collier MRN: [id number] DOB: [birth year] Sex: female   REASON FOR VISIT:    Follow up  HISOTRY OF PRESENT ILLNESS:    Stephanie Collier is a 88 y.o. female who is back for follow-up.  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  Prior to her operation she suffered a viral illness which kept her in bed for 2 weeks.  She was found to have a DVT as well as PE.  She had a IVC filter placed in an effort to avoid anticoagulation.  Her filter was removed several weeks after her procedure.  She continues to take an ARB for her hypertension.   She has had compression fractures in her back, a knee fracture and a hip fracture .  She has however made a great recovery.  She is here with her daughter who is dealing with recurrent breast cancer.  She denies any chest pain   PAST MEDICAL HISTORY:   Past Medical History:  Diagnosis Date   AAA (abdominal aortic aneurysm) (HCC)    Arthritis    hip   Hypertension    no meds now   Osteoporosis    Patellar fracture    left   Raynaud's disease    Scoliosis      FAMILY HISTORY:   Family History  Problem Relation Age of Onset   Breast cancer Daughter 59    SOCIAL HISTORY:   Social History   Tobacco Use   Smoking status: Never   Smokeless tobacco: Never  Substance Use Topics   Alcohol use: Not Currently    Comment: rare wine     ALLERGIES:   Allergies  Allergen Reactions   Fentanyl Shortness Of Breath   Acetaminophen Other (See Comments)    Constipation    Asa [Aspirin] Other (See Comments)    Per patient stomach cramps   Azithromycin Diarrhea   Calcium Carb-Magnesium Carb Other (See Comments)    skin lesions   Fosamax [Alendronate] Other (See Comments)     indigestion   Labetalol Hcl Other (See Comments)     indigestion     CURRENT MEDICATIONS:   Current Outpatient Medications  Medication Sig Dispense Refill   dapagliflozin propanediol (FARXIGA) 10 MG TABS tablet Take 1 tablet (10 mg total) by mouth daily. 90 tablet 3   furosemide (LASIX) 40 MG tablet Take 1 tablet (40 mg total) by mouth daily. And extra 40mg  as needed if shortness of breath, worsening edema, increase in weight >5lbs in 3 days 135 tablet 3   levothyroxine (SYNTHROID) 25 MCG tablet Take 1 tablet (25 mcg total) by mouth daily before breakfast. 30 tablet 3   Multiple Vitamin (MULTIVITAMIN PO) Take 1 tablet by mouth daily.     spironolactone (ALDACTONE) 25 MG tablet Take 0.5 tablets (12.5 mg total) by mouth daily. 45 tablet 3   No current facility-administered medications for this visit.    REVIEW OF SYSTEMS:   [X]  denotes positive finding, [ ]  denotes negative finding Cardiac  Comments:  Chest pain or chest pressure:    Shortness of breath upon exertion:    Short of breath when lying flat:    Irregular heart rhythm:        Vascular    Pain in calf, thigh, or hip brought on by ambulation:  Pain in feet at night that wakes you up from your sleep:     Blood clot in your veins:    Leg swelling:         Pulmonary    Oxygen at home:    Productive cough:     Wheezing:         Neurologic    Sudden weakness in arms or legs:     Sudden numbness in arms or legs:     Sudden onset of difficulty speaking or slurred speech:    Temporary loss of vision in one eye:     Problems with dizziness:         Gastrointestinal    Blood in stool:     Vomited blood:         Genitourinary    Burning when urinating:     Blood in urine:        Psychiatric    Major depression:         Hematologic    Bleeding problems:    Problems with blood clotting too easily:        Skin    Rashes or ulcers:        Constitutional    Fever or chills:      PHYSICAL EXAM:   Vitals:    03/28/23 1117  BP: (!) 147/71  Pulse: 64  SpO2: 98%  Weight: 95 lb (43.1 kg)  Height: 5' (1.524 m)    GENERAL: The patient is a well-nourished female, in no acute distress. The vital signs are documented above. CARDIAC: There is a regular rate and rhythm.  VASCULAR: Trace lower extremity edema PULMONARY: Non-labored respirations ABDOMEN: Soft and non-tender   MUSCULOSKELETAL: There are no major deformities or cyanosis. NEUROLOGIC: No focal weakness or paresthesias are detected. SKIN: There are no ulcers or rashes noted. PSYCHIATRIC: The patient has a normal affect.  STUDIES:   I have reviewed the following CTA:  1. Stable appearance of the descending thoracic aortic stent graft. No significant aneurysm sac associated with the stent graft. Stable appearance of the distal descending thoracic aorta just beyond the stent graft and there could be a chronic ulceration in this area. 2. Stable mild dilatation of the ascending thoracic aorta measuring up to 4.0 cm. 3. Infrarenal abdominal aortic aneurysm has slightly enlarged in size. Abdominal aortic aneurysm measures 4.5 cm and measured 4.2 cm in 2024. 4. Stable aneurysms involving the bilateral common iliac arteries and bilateral internal iliac arteries. 5. New compression fractures involving L2 and T11. Multiple old vertebral body compression fractures as described. 6. Esophagus is dilated. Findings could be associated with esophageal dysmotility or stricture. 7. Increased low-density material in the central aspect of the uterus. This could be related to endometrial thickening or fluid. Consider further evaluation with a pelvic ultrasound. 8. Left posterior abdominal wall hernia contains a loop of bowel. No bowel dilatation or obstruction. 9. Aortic Atherosclerosis (ICD10-I70.0). MEDICAL ISSUES:   TAAA: CT scan shows successful exclusion of her aneurysm.  I think we can go 18 months before reimaging this with a CTA of the  chest.  AAA: Maximum diameter is 4.5 cm.  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  I will have her return with ultrasound imaging and 6 months.  Once she gets to close to 5 cm I will get another CT angiogram.    Charlena Cross, MD, FACS [facility] [phone] Pager [phone]

## 2023-03-31 ENCOUNTER — Other Ambulatory Visit: Payer: Self-pay | Admitting: *Deleted

## 2023-03-31 DIAGNOSIS — Z9889 Other specified postprocedural states: Secondary | ICD-10-CM

## 2023-04-01 ENCOUNTER — Ambulatory Visit: Admitting: Podiatry

## 2023-04-01 DIAGNOSIS — I739 Peripheral vascular disease, unspecified: Secondary | ICD-10-CM | POA: Diagnosis not present

## 2023-04-01 DIAGNOSIS — B351 Tinea unguium: Secondary | ICD-10-CM | POA: Diagnosis not present

## 2023-04-01 DIAGNOSIS — M79675 Pain in left toe(s): Secondary | ICD-10-CM | POA: Diagnosis not present

## 2023-04-01 DIAGNOSIS — J449 Chronic obstructive pulmonary disease, unspecified: Secondary | ICD-10-CM | POA: Diagnosis not present

## 2023-04-01 DIAGNOSIS — M79674 Pain in right toe(s): Secondary | ICD-10-CM | POA: Diagnosis not present

## 2023-04-01 DIAGNOSIS — R269 Unspecified abnormalities of gait and mobility: Secondary | ICD-10-CM | POA: Diagnosis not present

## 2023-04-01 DIAGNOSIS — Z86711 Personal history of pulmonary embolism: Secondary | ICD-10-CM | POA: Diagnosis not present

## 2023-04-01 NOTE — Progress Notes (Signed)
  Subjective:  Patient ID: Stephanie Collier, female    DOB: November 06, 1935,  MRN: [id number]  Chief Complaint  Patient presents with   Nail Problem    Nail trim    88 y.o. female returns for the above complaint.  Patient presents with thickened and onychodystrophy mycotic toenails x 10 mild pain on palpation worse with ambulation as pressure patient would like for me to debride down she is not able to resolve denies any other acute complaints  Objective:  There were no vitals filed for this visit. Podiatric Exam: Vascular: dorsalis pedis and posterior tibial pulses are palpable bilateral. Capillary return is immediate. Temperature gradient is WNL. Skin turgor WNL  Sensorium: Normal Semmes Weinstein monofilament test. Normal tactile sensation bilaterally. Nail Exam: Pt has thick disfigured discolored nails with subungual debris noted bilateral entire nail hallux through fifth toenails.  Pain on palpation to the nails. Ulcer Exam: There is no evidence of ulcer or pre-ulcerative changes or infection. Orthopedic Exam: Muscle tone and strength are WNL. No limitations in general ROM. No crepitus or effusions noted.  Skin: No Porokeratosis. No infection or ulcers    Assessment & Plan:   1. Pain due to onychomycosis of toenails of both feet [B35.1, M79.675, M79.674]     Patient was evaluated and treated and all questions answered.  Onychomycosis with pain  -Nails palliatively debrided as below. -Educated on self-care  Procedure: Nail Debridement Rationale: pain  Type of Debridement: manual, sharp debridement. Instrumentation: Nail nipper, rotary burr. Number of Nails: 10  Procedures and Treatment: Consent by patient was obtained for treatment procedures. The patient understood the discussion of treatment and procedures well. All questions were answered thoroughly reviewed. Debridement of mycotic and hypertrophic toenails, 1 through 5 bilateral and clearing of subungual debris. No ulceration, no  infection noted.  Return Visit-Office Procedure: Patient instructed to return to the office for a follow up visit 3 months for continued evaluation and treatment.  Nicholes Rough, DPM    Return in about 3 months (around 07/02/2023) for Routine Foot Care.

## 2023-04-12 ENCOUNTER — Telehealth: Payer: Self-pay | Admitting: Cardiovascular Disease

## 2023-04-12 NOTE — Telephone Encounter (Signed)
 Pt is requesting to switch from Dr Duke Salvia to Dr Tenny Craw

## 2023-04-17 DIAGNOSIS — J9611 Chronic respiratory failure with hypoxia: Secondary | ICD-10-CM | POA: Diagnosis not present

## 2023-04-17 DIAGNOSIS — I509 Heart failure, unspecified: Secondary | ICD-10-CM | POA: Diagnosis not present

## 2023-04-22 ENCOUNTER — Telehealth: Payer: Self-pay | Admitting: Cardiovascular Disease

## 2023-04-22 DIAGNOSIS — I2510 Atherosclerotic heart disease of native coronary artery without angina pectoris: Secondary | ICD-10-CM

## 2023-04-22 DIAGNOSIS — I5022 Chronic systolic (congestive) heart failure: Secondary | ICD-10-CM

## 2023-04-22 DIAGNOSIS — E785 Hyperlipidemia, unspecified: Secondary | ICD-10-CM

## 2023-04-22 DIAGNOSIS — Z9889 Other specified postprocedural states: Secondary | ICD-10-CM

## 2023-04-22 DIAGNOSIS — I714 Abdominal aortic aneurysm, without rupture, unspecified: Secondary | ICD-10-CM

## 2023-04-22 DIAGNOSIS — I351 Nonrheumatic aortic (valve) insufficiency: Secondary | ICD-10-CM

## 2023-04-22 MED ORDER — SPIRONOLACTONE 25 MG PO TABS: 25.0000 mg | ORAL_TABLET | Freq: Every day | ORAL | 3 refills | Status: AC

## 2023-04-22 NOTE — Telephone Encounter (Signed)
 Dr Duke Salvia ok with switch

## 2023-04-22 NOTE — Telephone Encounter (Signed)
 Pt c/o medication issue:  1. Name of Medication:   spironolactone (ALDACTONE) 25 MG tablet    2. How are you currently taking this medication (dosage and times per day)?  Take 0.5 tablets (12.5 mg total) by mouth daily.     3. Are you having a reaction (difficulty breathing--STAT)? No  4. What is your medication issue? Pt is requesting a callback regarding the increase in her having to now take a full tablet instead of half and she now has concerns about refills. Please advise

## 2023-04-22 NOTE — Telephone Encounter (Signed)
 Called and spoke to pt. Pt states she will need new prescription to take one whole tablet of Spiro. Refill sent to preferred pharmacy.  Lab slips will be mailed to pt.

## 2023-05-03 ENCOUNTER — Other Ambulatory Visit: Payer: Self-pay | Admitting: Family

## 2023-05-03 DIAGNOSIS — I10 Essential (primary) hypertension: Secondary | ICD-10-CM | POA: Diagnosis not present

## 2023-05-03 DIAGNOSIS — I5022 Chronic systolic (congestive) heart failure: Secondary | ICD-10-CM | POA: Diagnosis not present

## 2023-05-04 ENCOUNTER — Encounter (HOSPITAL_BASED_OUTPATIENT_CLINIC_OR_DEPARTMENT_OTHER): Payer: Self-pay

## 2023-05-04 LAB — BASIC METABOLIC PANEL WITH GFR
BUN/Creatinine Ratio: 28 (ref 12–28)
BUN: 23 mg/dL (ref 8–27)
CO2: 27 mmol/L (ref 20–29)
Calcium: 9.6 mg/dL (ref 8.7–10.3)
Chloride: 96 mmol/L (ref 96–106)
Creatinine, Ser: 0.81 mg/dL (ref 0.57–1.00)
Glucose: 85 mg/dL (ref 70–99)
Potassium: 4.1 mmol/L (ref 3.5–5.2)
Sodium: 141 mmol/L (ref 134–144)
eGFR: 71 mL/min/{1.73_m2} (ref >59)

## 2023-05-04 LAB — CBC
Hematocrit: 45 % (ref 34.0–46.6)
Hemoglobin: 15.2 g/dL (ref 11.1–15.9)
MCH: 32 pg (ref 26.6–33.0)
MCHC: 33.8 g/dL (ref 31.5–35.7)
MCV: 95 fL (ref 79–97)
Platelets: 146 10*3/uL — ABNORMAL LOW (ref 150–450)
RBC: 4.75 x10E6/uL (ref 3.77–5.28)
RDW: 12.3 % (ref 11.7–15.4)
WBC: 6.7 10*3/uL (ref 3.4–10.8)

## 2023-05-17 DIAGNOSIS — I509 Heart failure, unspecified: Secondary | ICD-10-CM | POA: Diagnosis not present

## 2023-05-17 DIAGNOSIS — J9601 Acute respiratory failure with hypoxia: Secondary | ICD-10-CM | POA: Diagnosis not present

## 2023-05-17 DIAGNOSIS — J9611 Chronic respiratory failure with hypoxia: Secondary | ICD-10-CM | POA: Diagnosis not present

## 2023-06-17 ENCOUNTER — Telehealth: Payer: Self-pay | Admitting: Cardiovascular Disease

## 2023-06-17 DIAGNOSIS — I509 Heart failure, unspecified: Secondary | ICD-10-CM | POA: Diagnosis not present

## 2023-06-17 DIAGNOSIS — J9611 Chronic respiratory failure with hypoxia: Secondary | ICD-10-CM | POA: Diagnosis not present

## 2023-06-17 DIAGNOSIS — J9601 Acute respiratory failure with hypoxia: Secondary | ICD-10-CM | POA: Diagnosis not present

## 2023-06-17 NOTE — Telephone Encounter (Signed)
 Returned call to pt- no answer, unable to leave message due to no dpr on file

## 2023-06-17 NOTE — Telephone Encounter (Signed)
 Pt called in stating she needs someone to authorize her getting her home oxygen . Please advise.

## 2023-06-20 ENCOUNTER — Ambulatory Visit: Admitting: Orthopedic Surgery

## 2023-06-20 NOTE — Telephone Encounter (Signed)
 2nd call attempt- no answer, unable to leave message as NO DPR ON FILE

## 2023-06-21 NOTE — Telephone Encounter (Signed)
 3rd call attempt- no answer, unable to leave message due to no dpr on file.

## 2023-07-17 DIAGNOSIS — J9601 Acute respiratory failure with hypoxia: Secondary | ICD-10-CM | POA: Diagnosis not present

## 2023-07-17 DIAGNOSIS — J9611 Chronic respiratory failure with hypoxia: Secondary | ICD-10-CM | POA: Diagnosis not present

## 2023-07-17 DIAGNOSIS — I509 Heart failure, unspecified: Secondary | ICD-10-CM | POA: Diagnosis not present

## 2023-07-18 ENCOUNTER — Telehealth: Payer: Self-pay | Admitting: Orthopedic Surgery

## 2023-07-18 NOTE — Telephone Encounter (Signed)
 I called and scheduled her  for 08/18/23 @ 215. I advised that I would look out for a cancellation and I would call her if something opened sooner.

## 2023-07-18 NOTE — Telephone Encounter (Signed)
 Pt called states she really wait until Aug to see Georgina. SH is asking if anyway she can see him sometime next week. She missed her appt due to begin sick. Please call this pt about this matter at [phone].

## 2023-07-20 ENCOUNTER — Ambulatory Visit: Admitting: Podiatry

## 2023-07-20 ENCOUNTER — Encounter: Payer: Self-pay | Admitting: Podiatry

## 2023-07-20 DIAGNOSIS — M79674 Pain in right toe(s): Secondary | ICD-10-CM

## 2023-07-20 DIAGNOSIS — B351 Tinea unguium: Secondary | ICD-10-CM | POA: Diagnosis not present

## 2023-07-20 DIAGNOSIS — M79675 Pain in left toe(s): Secondary | ICD-10-CM | POA: Diagnosis not present

## 2023-07-24 ENCOUNTER — Encounter: Payer: Self-pay | Admitting: Podiatry

## 2023-07-24 NOTE — Progress Notes (Signed)
  Subjective:  Patient ID: Stephanie Collier, female    DOB: 01-Sep-1935,  MRN: [id number]  88 y.o. female presents painful mycotic toenails of both feet that are difficult to trim. Pain interferes with daily activities and wearing enclosed shoe gear comfortably. She is accompanied by her daughter on today's ivist. Chief Complaint  Patient presents with   Diabetes    DFC NIDDM A1C ? LOV with PCP 03/2023.   New problem(s): None   PCP is Elliot Charm, MD.  Allergies  Allergen Reactions   Fentanyl  Shortness Of Breath   Acetaminophen  Other (See Comments)    Constipation    Asa [Aspirin ] Other (See Comments)    Per patient stomach cramps   Azithromycin  Diarrhea   Calcium  Carb-Magnesium  Carb Other (See Comments)    skin lesions   Fosamax [Alendronate] Other (See Comments)    indigestion   Labetalol  Hcl Other (See Comments)     indigestion    Review of Systems: Negative except as noted in the HPI.   Objective:  Stephanie Collier is a pleasant 88 y.o. female thin build in NAD. AAO x 3.  Vascular Examination: Vascular status intact b/l with palpable pedal pulses. CFT immediate b/l. Pedal hair present. No edema. No pain with calf compression b/l. Skin temperature gradient WNL b/l. No varicosities noted. No cyanosis or clubbing noted.  Neurological Examination: Sensation grossly intact b/l with 10 gram monofilament. Vibratory sensation intact b/l.  Dermatological Examination: Pedal skin with normal turgor, texture and tone b/l. No open wounds nor interdigital macerations noted. Toenails 1-5 b/l thick, discolored, elongated with subungual debris and pain on dorsal palpation. No hyperkeratotic lesions noted b/l.   Musculoskeletal Examination: Muscle strength 5/5 to b/l LE.  No pain, crepitus noted b/l.Utilizes rollator for ambulation assistance.  Radiographs: None  Last A1c:       No data to display           Assessment:   1. Pain due to onychomycosis of toenails of both feet  [B35.1, M79.675, M79.674]    Plan:  Patient was evaluated and treated. All patient's and/or POA's questions/concerns addressed on today's visit. Toenails 1-5 debrided in length and girth without incident. Continue soft, supportive shoe gear daily. Report any pedal injuries to medical professional. Call office if there are any questions/concerns. -Patient/POA to call should there be question/concern in the interim.  Return in about 3 months (around 10/20/2023).  Delon LITTIE Merlin, DPM      [facility] LOCATION: [address] [place] [address]                   Office [phone]   [facility] LOCATION: [address] [place], [place] [address] Office [phone]

## 2023-08-17 DIAGNOSIS — J9611 Chronic respiratory failure with hypoxia: Secondary | ICD-10-CM | POA: Diagnosis not present

## 2023-08-17 DIAGNOSIS — J9601 Acute respiratory failure with hypoxia: Secondary | ICD-10-CM | POA: Diagnosis not present

## 2023-08-17 DIAGNOSIS — I509 Heart failure, unspecified: Secondary | ICD-10-CM | POA: Diagnosis not present

## 2023-08-18 ENCOUNTER — Ambulatory Visit: Admitting: Orthopedic Surgery

## 2023-08-18 ENCOUNTER — Other Ambulatory Visit (INDEPENDENT_AMBULATORY_CARE_PROVIDER_SITE_OTHER): Payer: Self-pay

## 2023-08-18 VITALS — BP 151/63 | HR 73 | Ht 60.0 in | Wt 93.8 lb

## 2023-08-18 DIAGNOSIS — M545 Low back pain, unspecified: Secondary | ICD-10-CM

## 2023-08-18 NOTE — Progress Notes (Signed)
 Orthopedic Spine Surgery Office Note  Assessment: Patient is a 88 y.o. female with lower lumbar pain that I suspect is from a new L5 compression fracture   Plan: -Patient has tried tylenol , heating pad  -Talked about her options with her.  Most compression fractures do go on to heal without any kind of surgical intervention.  Discussed medication to help control the pain but she is not interested in any medications.  I also discussed kyphoplasty as an option which she has had before but she is not interested in that after covering the risks and benefits -I explained that she has had multiple fragility associated fractures including a hip fracture and several compression fractures.  I recommended that she treat the underlying problem with medication.  She said she had problems with Fosamax.  Told her that there are multiple other medications including ones like [employer] synthetic of natural hormones.  She said she does not have good coverage for prescription drugs so she is going to look into that and let me know if she would like to proceed with that in which case I would refer her to Ronal Dragon -Her BMI is also less than 18.5 which does put her at increased risk for fractures as well -I do not see any lesions within the vertebra on her most recent CT scan from 03/2023 in the L5, so do not recommend any further advanced imaging at this time -Patient should return to office on an as-needed basis   Patient expressed understanding of the plan and all questions were answered to the patient's satisfaction.   ___________________________________________________________________________   History:  Patient is a 88 y.o. female who presents today for lumbar spine.  Patient has a history of low back pain that was present in 2024.  She underwent a kyphoplasty around that time and noticed improvement in her pain.  Within the last couple of months, she has noticed progressively worsening pain in her lower  back.  She feels that in the lowest part of the lumbar spine.  She has no pain radiating into either lower extremity.  There was no trauma or injury that preceded the onset of the pain.   Weakness: Denies Symptoms of imbalance: Denies Paresthesias and numbness: Denies Bowel or bladder incontinence: Denies Saddle anesthesia: Denies  Treatments tried:heating pad, tylenol    Review of systems: Denies fevers and chills, night sweats, unexplained weight loss, history of cancer.  Has had pain that wakes her at night  Past medical history: AAA HTN Osteoporosis Scoliosis Raynaud's disease  Allergies: fentanyl , acetaminophen , aspirin , azithromycin , fosamax, labetalol   Past surgical history:  Kyphoplasty Left patella fx ORIF Appendectomy Aortic endovascular stent Left CMN  Social history: Denies use of nicotine product (smoking, vaping, patches, smokeless) Alcohol use: rare Denies recreational drug use   Physical Exam:  BMI of 18.3  General: no acute distress, appears stated age Neurologic: alert, answering questions appropriately, following commands Respiratory: unlabored breathing on room air, symmetric chest rise Psychiatric: appropriate affect, normal cadence to speech   MSK (spine):  -Strength exam      Left  Right EHL    5/5  5/5 TA    5/5  5/5 GSC    5/5  5/5 Knee extension  5/5  5/5 Hip flexion   5/5  5/5  -Sensory exam    Sensation intact to light touch in L3-S1 nerve distributions of bilateral lower extremities  Imaging: XRs of the lumbar spine from 08/18/2023 were independently reviewed and interpreted, showing kyphoplasty  at L4 and L3.  It looks like the unilateral approach for the kyphoplasty has given the distribution of the cement.  There is a degenerative scoliosis with apex at L2.  Possible compression fracture at L5.  No evidence of instability on flexion/extension views.    Patient name: Stephanie Collier Patient MRN: [id number] Date of visit:  08/18/23   I spent 30 minutes in the room with the patient and her daughter going over her imaging and the likely source of her pain.  I explained that she does have scoliosis but she is not likely symptomatic from that since she has had this for years without issue.  I spent time going over some of the medications that her options nowadays for osteoporosis.  Treatment options for osteoporosis.

## 2023-08-31 DIAGNOSIS — H5202 Hypermetropia, left eye: Secondary | ICD-10-CM | POA: Diagnosis not present

## 2023-09-02 DIAGNOSIS — I719 Aortic aneurysm of unspecified site, without rupture: Secondary | ICD-10-CM | POA: Diagnosis not present

## 2023-09-02 DIAGNOSIS — Z23 Encounter for immunization: Secondary | ICD-10-CM | POA: Diagnosis not present

## 2023-09-02 DIAGNOSIS — Z1331 Encounter for screening for depression: Secondary | ICD-10-CM | POA: Diagnosis not present

## 2023-09-02 DIAGNOSIS — Z86711 Personal history of pulmonary embolism: Secondary | ICD-10-CM | POA: Diagnosis not present

## 2023-09-02 DIAGNOSIS — R269 Unspecified abnormalities of gait and mobility: Secondary | ICD-10-CM | POA: Diagnosis not present

## 2023-09-02 DIAGNOSIS — E039 Hypothyroidism, unspecified: Secondary | ICD-10-CM | POA: Diagnosis not present

## 2023-09-02 DIAGNOSIS — Z136 Encounter for screening for cardiovascular disorders: Secondary | ICD-10-CM | POA: Diagnosis not present

## 2023-09-02 DIAGNOSIS — J449 Chronic obstructive pulmonary disease, unspecified: Secondary | ICD-10-CM | POA: Diagnosis not present

## 2023-09-02 DIAGNOSIS — Z0000 Encounter for general adult medical examination without abnormal findings: Secondary | ICD-10-CM | POA: Diagnosis not present

## 2023-09-02 LAB — LAB REPORT - SCANNED: EGFR: 85

## 2023-09-15 ENCOUNTER — Encounter: Payer: Self-pay | Admitting: *Deleted

## 2023-09-17 DIAGNOSIS — J9611 Chronic respiratory failure with hypoxia: Secondary | ICD-10-CM | POA: Diagnosis not present

## 2023-09-17 DIAGNOSIS — I509 Heart failure, unspecified: Secondary | ICD-10-CM | POA: Diagnosis not present

## 2023-09-19 ENCOUNTER — Encounter (HOSPITAL_BASED_OUTPATIENT_CLINIC_OR_DEPARTMENT_OTHER): Payer: Self-pay | Admitting: Cardiovascular Disease

## 2023-09-19 ENCOUNTER — Ambulatory Visit (HOSPITAL_BASED_OUTPATIENT_CLINIC_OR_DEPARTMENT_OTHER): Admitting: Cardiovascular Disease

## 2023-09-19 VITALS — BP 118/58 | HR 64 | Ht 60.0 in | Wt 94.0 lb

## 2023-09-19 DIAGNOSIS — I10 Essential (primary) hypertension: Secondary | ICD-10-CM

## 2023-09-19 DIAGNOSIS — I739 Peripheral vascular disease, unspecified: Secondary | ICD-10-CM | POA: Diagnosis not present

## 2023-09-19 DIAGNOSIS — I351 Nonrheumatic aortic (valve) insufficiency: Secondary | ICD-10-CM | POA: Diagnosis not present

## 2023-09-19 DIAGNOSIS — I71012 Dissection of descending thoracic aorta: Secondary | ICD-10-CM | POA: Diagnosis not present

## 2023-09-19 DIAGNOSIS — I5020 Unspecified systolic (congestive) heart failure: Secondary | ICD-10-CM

## 2023-09-19 NOTE — Patient Instructions (Addendum)
 Medication Instructions:  Your physician recommends that you continue on your current medications as directed. Please refer to the Current Medication list given to you today.  *If you need a refill on your cardiac medications before your next appointment, please call your pharmacy*  Lab Work: NONE  Testing/Procedures: NONE  Follow-Up: At [facility], you and your health needs are our priority.  As part of our continuing mission to provide you with exceptional heart care, our providers are all part of one team.  This team includes your primary Cardiologist (physician) and Advanced Practice Providers or APPs (Physician Assistants and Nurse Practitioners) who all work together to provide you with the care you need, when you need it.  Your next appointment:   3 month(s) WITH CAITLIN W NP OR  [name] S NP   6 MONTHS WITH DR [facility]    We recommend signing up for the patient portal called MyChart.  Sign up information is provided on this After Visit Summary.  MyChart is used to connect with patients for Virtual Visits (Telemedicine).  Patients are able to view lab/test results, encounter notes, upcoming appointments, etc.  Non-urgent messages can be sent to your provider as well.   To learn more about what you can do with MyChart, go to [email or web address].

## 2023-09-19 NOTE — Progress Notes (Signed)
 Cardiology Office Note:  .   Date:  09/19/2023  ID:  Stephanie Collier, DOB 11-17-1935, MRN [id number] PCP: Stephanie Charm, MD  [facility] Providers Cardiologist:  Stephanie Scarce, MD    History of Present Illness: .    Stephanie Collier is a 88 y.o. female with HFrEF, severe aortic regurgitation, mild ascending aortic aneurysm, 4.5 cm infrarenal abdominal aortic catheter is on, penetrating aortic ulcer, and hypertension here for follow-up.  She was admitted 01/2022 with a compression fracture of T5-T7.  She underwent arthroplasty of L3 and L4.  She was admitted later that month with new onset acute systolic and diastolic heart failure.  LVEF was 30-35%.  Echo in the prior year revealed normal systolic function.  She declined cardiac catheterization and was medically managed.  She was also noted to be hypothyroid and started on levothyroxine .  She started on rosuvastatin  but switched to pravastatin  due to intolerance.  Palliative care was recommended but she declined.  Last echo 02/2023 revealed improvement in systolic function to 40-45% with grade 1 diastolic dysfunction.  She is noted to have a myxomatous mitral valve and severe aortic regurgitation.  Discussed the use of AI scribe software for clinical note transcription with the patient, who gave verbal consent to proceed.  History of Present Illness Stephanie Collier is accompanied by her daughter, Stephanie Collier.  She has a history of congestive heart failure and aortic valve regurgitation. In February 2024, her heart pumping function decreased to 30-35%, leading to hospitalization. After treatment with diuretics, her heart function improved to 40-45%.  She notes improvement in her heart condition since the hospitalization. Her current medications include Farxiga , Lasix  40 mg once daily, and spironolactone . She has not needed extra Lasix  and reports well-controlled blood pressure at home, typically around 117-124/60s.  Her aorta has been noted to be  enlarged on imaging, with prior reports of dilation in the ascending and abdominal aorta. In 2023, she had normal heart pumping function with moderate aortic valve leakage.  She has a history of high cholesterol but is not currently taking statins due to intolerance. Recent lab work showed high LDL cholesterol and slightly elevated glucose, though she was not fasting at the time of the test.  Her daughter has breast cancer that has metastasized to the bone and liver. She also has a history of aneurysms, with a recent measurement of her abdominal aorta at 4.5 cm, which is being monitored.  No significant shortness of breath and maintains a good appetite. She experiences some queasiness when standing for long periods.   ROS:  As per HPI  Studies Reviewed: .       Echo 03/11/23: IMPRESSIONS     1. Left ventricular ejection fraction, by estimation, is 40 to 45%. The  left ventricle has mildly decreased function. The left ventricle  demonstrates global hypokinesis. Left ventricular diastolic parameters are  consistent with Grade I diastolic  dysfunction (impaired relaxation).   2. Right ventricular systolic function is normal. The right ventricular  size is normal. There is moderately elevated pulmonary artery systolic  pressure. The estimated right ventricular systolic pressure is 49.0 mmHg.   3. RVOT mobile mass seen on prior exam is not visualized today.   4. Borderline bileaflet mitral valve prolapse. The mitral valve is  myxomatous. Mild mitral valve regurgitation. No evidence of mitral  stenosis.   5. The aortic valve is abnormal. There is moderate calcification of the  aortic valve. Aortic valve regurgitation is severe. No aortic stenosis is  present.   6. The inferior vena cava is dilated in size with >50% respiratory  variability, suggesting right atrial pressure of 8 mmHg.   7. Abdominal aorta is dilated, measures approximately 49 x 46.5 mm,  consider dedicated imaging either  ultrasound or CT to further evaluate.    Risk Assessment/Calculations:             Physical Exam:   VS:  BP (!) 118/58   Pulse 64   Ht 5' (1.524 m)   Wt 94 lb (42.6 kg)   BMI 18.36 kg/m  , BMI Body mass index is 18.36 kg/m. GENERAL:  Well appearing HEENT: Pupils equal round and reactive, fundi not visualized, oral mucosa unremarkable NECK:  + JVD.  Waterhammer pulse.  Waveform within normal limits, carotid upstroke brisk and symmetric, no bruits, no thyromegaly LUNGS:  Clear to auscultation bilaterally HEART:  RRR.  PMI not displaced or sustained,S1 and S2 within normal limits, no S3, no S4, no clicks, no rubs, III/IV diastolic murmur ABD:  Flat, positive bowel sounds normal in frequency in pitch, no bruits, no rebound, no guarding, no midline pulsatile mass, no hepatomegaly, no splenomegaly EXT:  2 plus pulses throughout, no edema, no cyanosis no clubbing SKIN:  No rashes no nodules NEURO:  Cranial nerves II through XII grossly intact, motor grossly intact throughout PSYCH:  Cognitively intact, oriented to person place and time   ASSESSMENT AND PLAN: .    Assessment & Plan # Systolic heart failure secondary to severe aortic valve regurgitation Severe aortic valve regurgitation causing systolic heart failure. Heart function improved to 40-45% with medications.  Surgery avoided due to risks and patient's preference. - Continue Farxiga , Lasix , and spironolactone . - Monitor for increased dyspnea, edema, or other symptoms. - Avoid surgery at this time due to patient's preference and risk assessment.  # Abdominal aortic aneurysm, 4.5 cm, under surveillance Aneurysm measures 4.5 cm, not requiring intervention yet. - Continue surveillance of the abdominal aortic aneurysm.  She follows closely with Vascular.  - Schedule follow-up imaging to monitor aneurysm size.  # Ascending aortic dilation, mild Mild dilation of the ascending aorta, no immediate intervention required. - Continue  monitoring the ascending aortic dilation.  # Hypertension Blood pressure well-controlled, crucial for managing heart failure symptoms.  Initially elevated but better on repeat.  - Monitor blood pressure regularly. - Recheck blood pressure after resting to ensure accurate readings.  # Non-obstructive CAD:  # Hyperlipidemia: Elevated LDL cholesterol. Statins avoided due to intolerance and patient's preference.  She isn't interested in trying any other medications.  - Focus on maintaining nutrition and managing heart failure symptoms. - Avoid statins due to intolerance and patient's preference.    Dispo: f/u with Reche Finder, NP n 3 months.  6 months with me.   Signed, Stephanie Scarce, MD

## 2023-09-28 ENCOUNTER — Encounter: Payer: Self-pay | Admitting: Internal Medicine

## 2023-10-03 ENCOUNTER — Ambulatory Visit

## 2023-10-03 ENCOUNTER — Other Ambulatory Visit (HOSPITAL_COMMUNITY)

## 2023-10-12 ENCOUNTER — Other Ambulatory Visit (HOSPITAL_BASED_OUTPATIENT_CLINIC_OR_DEPARTMENT_OTHER): Payer: Self-pay | Admitting: Cardiology

## 2023-10-12 DIAGNOSIS — I351 Nonrheumatic aortic (valve) insufficiency: Secondary | ICD-10-CM

## 2023-10-12 DIAGNOSIS — E785 Hyperlipidemia, unspecified: Secondary | ICD-10-CM

## 2023-10-12 DIAGNOSIS — I714 Abdominal aortic aneurysm, without rupture, unspecified: Secondary | ICD-10-CM

## 2023-10-12 DIAGNOSIS — Z9889 Other specified postprocedural states: Secondary | ICD-10-CM

## 2023-10-12 DIAGNOSIS — I5022 Chronic systolic (congestive) heart failure: Secondary | ICD-10-CM

## 2023-10-12 DIAGNOSIS — I2510 Atherosclerotic heart disease of native coronary artery without angina pectoris: Secondary | ICD-10-CM

## 2023-10-17 DIAGNOSIS — J9601 Acute respiratory failure with hypoxia: Secondary | ICD-10-CM | POA: Diagnosis not present

## 2023-10-17 DIAGNOSIS — J9611 Chronic respiratory failure with hypoxia: Secondary | ICD-10-CM | POA: Diagnosis not present

## 2023-10-17 DIAGNOSIS — I509 Heart failure, unspecified: Secondary | ICD-10-CM | POA: Diagnosis not present

## 2023-11-07 ENCOUNTER — Ambulatory Visit: Admitting: Physician Assistant

## 2023-11-07 ENCOUNTER — Ambulatory Visit (HOSPITAL_COMMUNITY)
Admission: RE | Admit: 2023-11-07 | Discharge: 2023-11-07 | Disposition: A | Discharge status: Home / Self Care | Source: Ambulatory Visit | Attending: Surgery | Admitting: Surgery

## 2023-11-07 ENCOUNTER — Other Ambulatory Visit: Payer: Self-pay | Admitting: Surgery

## 2023-11-07 VITALS — BP 136/70 | HR 73 | Temp 97.7°F | Wt 95.5 lb

## 2023-11-07 DIAGNOSIS — I7123 Aneurysm of the descending thoracic aorta, without rupture: Secondary | ICD-10-CM | POA: Insufficient documentation

## 2023-11-07 DIAGNOSIS — I7143 Infrarenal abdominal aortic aneurysm, without rupture: Secondary | ICD-10-CM

## 2023-11-07 DIAGNOSIS — Z9889 Other specified postprocedural states: Secondary | ICD-10-CM

## 2023-11-07 NOTE — Progress Notes (Signed)
 Office Note     CC:  follow up Requesting Provider:  Elliot Charm,*  HPI: Stephanie Collier is a 88 y.o. 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   She is here today with her daughter. Overall she reports doing well. She did have recent cough that was  wet. She says it was similar to when she had her dissection. This made her throat sore and she had some radiating discomfort to her ear. No associated back or chest pain. The cough resolved. She otherwise denies any chest or back pain that is new or different. She does not have any issues with her arms or legs. She uses rolling walker to ambulate. She does report some generalized fatigue.  Past Medical History:  Diagnosis Date   AAA (abdominal aortic aneurysm)    Arthritis    hip   Hypertension    no meds now   Osteoporosis    Patellar fracture    left   Raynaud's disease    Scoliosis     Past Surgical History:  Procedure Laterality Date   APPENDECTOMY     at age 89   BREAST BIOPSY Left    CHEST TUBE INSERTION Left 04/04/2014   Procedure: CHEST TUBE INSERTION;  Surgeon: Gaile LELON New, MD;  Location: [facility] OR;  Service: Vascular;  Laterality: Left;  by Dr. Buckner   INSERTION OF VENA CAVA FILTER N/A 04/02/2014   Procedure: INSERTION OF VENA CAVA FILTER;  Surgeon: Gaile LELON New, MD;  Location: [facility] CATH LAB;  Service: Cardiovascular;  Laterality: N/A;   INTRAMEDULLARY (IM) NAIL INTERTROCHANTERIC Left 06/04/2021   Procedure: INTRAMEDULLARY (IM) NAIL INTERTROCHANTRIC;  Surgeon: Doll Skates, MD;  Location: [facility] OR;  Service: Orthopedics;  Laterality: Left;   IR KYPHO EA ADDL LEVEL THORACIC OR LUMBAR  02/10/2022   IR KYPHO LUMBAR INC FX REDUCE BONE BX UNI/BIL CANNULATION INC/IMAGING  02/10/2022   ORIF PATELLA Left 11/20/2020   Procedure: OPEN  REDUCTION INTERNAL (ORIF) FIXATION PATELLA;  Surgeon: Edna Toribio LABOR, MD;  Location: [facility];  Service: Orthopedics;  Laterality: Left;   PERIPHERAL VASCULAR CATHETERIZATION N/A 06/26/2014   Procedure: IVC Filter Removal;  Surgeon: Gaile LELON New, MD;  Location: [facility] INVASIVE CV LAB;  Service: Cardiovascular;  Laterality: N/A;   THORACIC AORTIC ENDOVASCULAR STENT GRAFT N/A 04/04/2014   Procedure: THORACIC AORTIC ENDOVASCULAR STENT GRAFT;  Surgeon: Gaile LELON New, MD;  Location: [facility] OR;  Service: Vascular;  Laterality: N/A;    Social History   Socioeconomic History   Marital status: Divorced    Spouse name: Not on file   Number of children: Not on file   Years of education: Not on file   Highest education level: Not on file  Occupational History   Not on file  Tobacco Use   Smoking status: Never   Smokeless tobacco: Never  Vaping Use   Vaping status: Never Used  Substance and Sexual Activity   Alcohol use: Not Currently    Comment: rare wine   Drug use: No   Sexual activity: Not Currently    Birth control/protection: Post-menopausal  Other Topics Concern   Not on file  Social History Narrative   Not on file   Social Drivers of Health   Financial Resource Strain: Not on file  Food Insecurity: No Food Insecurity (02/08/2022)   Hunger Vital  Sign    Worried About Programme Researcher, Broadcasting/film/video in the Last Year: Never true    Ran Out of Food in the Last Year: Never true  Transportation Needs: No Transportation Needs (02/08/2022)   PRAPARE - Administrator, Civil Service (Medical): No    Lack of Transportation (Non-Medical): No  Physical Activity: Not on file  Stress: Not on file  Social Connections: Unknown (05/22/2021)   [facility]   Social Network    Social Network: Not on file  Intimate Partner Violence: Not At Risk (02/08/2022)   Humiliation, Afraid, Rape, and Kick questionnaire    Fear of Current or Ex-Partner: No    Emotionally  Abused: No    Physically Abused: No    Sexually Abused: No    Family History  Problem Relation Age of Onset   Breast cancer Daughter 75    Current Outpatient Medications  Medication Sig Dispense Refill   dapagliflozin  propanediol (FARXIGA ) 10 MG TABS tablet Take 1 tablet (10 mg total) by mouth daily. 90 tablet 3   furosemide  (LASIX ) 40 MG tablet Take 1 tablet (40 mg total) by mouth daily. And extra 40mg  as needed if shortness of breath, worsening edema, increase in weight >5lbs in 3 days 135 tablet 3   levothyroxine  (SYNTHROID ) 25 MCG tablet Take 1 tablet (25 mcg total) by mouth daily before breakfast. 30 tablet 3   Multiple Vitamin (MULTIVITAMIN PO) Take 1 tablet by mouth daily.     spironolactone  (ALDACTONE ) 25 MG tablet Take 1 tablet (25 mg total) by mouth daily. (Patient taking differently: Take 12.5 mg by mouth daily.) 90 tablet 3   No current facility-administered medications for this visit.    Allergies  Allergen Reactions   Fentanyl  Shortness Of Breath   Acetaminophen  Other (See Comments)    Constipation    Dorethia Solomons ] Other (See Comments)    Per patient stomach cramps   Azithromycin  Diarrhea   Calcium  Carb-Magnesium  Carb Other (See Comments)    skin lesions   Fosamax [Alendronate] Other (See Comments)    indigestion   Labetalol  Hcl Other (See Comments)     indigestion     REVIEW OF SYSTEMS:  Negative unless noted in HPI [X]  denotes positive finding, [ ]  denotes negative finding Cardiac  Comments:  Chest pain or chest pressure:    Shortness of breath upon exertion:    Short of breath when lying flat:    Irregular heart rhythm:        Vascular    Pain in calf, thigh, or hip brought on by ambulation:    Pain in feet at night that wakes you up from your sleep:     Blood clot in your veins:    Leg swelling:         Pulmonary    Oxygen  at home:    Productive cough:     Wheezing:         Neurologic    Sudden weakness in arms or legs:     Sudden numbness  in arms or legs:     Sudden onset of difficulty speaking or slurred speech:    Temporary loss of vision in one eye:     Problems with dizziness:         Gastrointestinal    Blood in stool:     Vomited blood:         Genitourinary    Burning when urinating:     Blood in  urine:        Psychiatric    Major depression:         Hematologic    Bleeding problems:    Problems with blood clotting too easily:        Skin    Rashes or ulcers:        Constitutional    Fever or chills:      PHYSICAL EXAMINATION:  Vitals:   11/07/23 1045  BP: 136/70  Pulse: 73  Temp: 97.7 F (36.5 C)  TempSrc: Temporal  Weight: 95 lb 8 oz (43.3 kg)    General:  WDWN in NAD; vital signs documented above Gait: Normal, uses rollator HENT: WNL, normocephalic Pulmonary: normal non-labored breathing Cardiac: regular HR; no carotid bruits Abdomen: soft, NT, no masses. No palpable AAA.  Vascular Exam/Pulses: 2+ radial pulses, 2+ DP pulses bilaterally Extremities: without ischemic changes, without Gangrene , without cellulitis; without open wounds;  Musculoskeletal: no muscle wasting or atrophy  Neurologic: A&O X 3 Psychiatric:  The pt has Normal affect.   Non-Invasive Vascular Imaging:   VAS US  AAA Duplex: Abdominal Aorta Findings:  +-----------+-------+----------+--------+--------+--------------+----------  ----+  Location  AP (cm)Trans (cm)PSV     WaveformThrombus      Comments                                     (cm/s)                                         +-----------+-------+----------+--------+--------+--------------+----------  ----+  Proximal  2.50   2.50      79                                             +-----------+-------+----------+--------+--------+--------------+----------  ----+  Mid       2.20   2.20      124                                            +-----------+-------+----------+--------+--------+--------------+----------  ----+   Distal    5.10   5.10      35              Present,      fusiform                                                     residual lumen                                                             4.1 cm x 3.4  cm                             +-----------+-------+----------+--------+--------+--------------+----------  ----+  RT CIA Prox1.9    1.9       108                           fusiform,                                                                  unable to                                                                  duplicate  4.0                                                             cm noted  from                                                             prior  exam,                                                               however,                                                                   vessel is                                                                  mostly  obscured  by                                                               bowel gas        +-----------+-------+----------+--------+--------+--------------+----------  ----+  RT EIA Mid 1.1    1.1       83                                             +-----------+-------+----------+--------+--------+--------------+----------  ----+  LT CIA Prox2.3    2.3       24                            fusiform         +-----------+-------+----------+--------+--------+--------------+----------  ----+  LT EIA Mid 1.2    1.2       73                                             +-----------+-------+----------+--------+--------+--------------+----------  ----+   Visualization of the Right CIA Mid artery, Right CIA Distal artery,  Right EIA Proximal artery, Left CIA Distal artery and Left EIA Proximal artery was limited.   IVC/Iliac Findings:  +--------+------+--------+--------+   IVC   PatentThrombusComments  +--------+------+--------+--------+  IVC Proxpatent                  +--------+------+--------+--------+   Summary:  Abdominal Aorta: There is evidence of abnormal dilatation of the distal Abdominal aorta. There is evidence of abnormal dilation of the Right Common Iliac artery and Left Common Iliac artery. The largest aortic diameter remains essentially unchanged compared to prior exam. Previous diameter measurement was 4.9 cm obtained on 09/20/2022.   IVC/Iliac: Patent IVC.    ASSESSMENT/PLAN:: 88 y.o. female here for surveillance follow up of EVAR of descending thoracic aortic penetrating ulcer with aneurysmal degeneration and intramural hematoma extending from the subclavian artery down to the visceral segment. She is without any associated chest pain, back pain or abdominal pain. Her last CT scan in March showed successful exclusion of her aneurysm. She has no claudication, rest pain or tissue loss.  - Duplex today shows Maximum diameter of 5.1 cm. Maximum diameter of her iliacs are 2.3 on exam today. Overall everything is stable - She will follow up in 6 months with repeat EVAR duplex - She will be due for repeat CTA of chest to evaluate her Thoracic aneurysm after her next follow up  - She has requested to see Dr. Serene at her next follow up in 6 months   Teretha Damme, PA-C [facility] [phone]  Clinic MD:   Serene

## 2023-11-08 ENCOUNTER — Telehealth: Payer: Self-pay | Admitting: Cardiovascular Disease

## 2023-11-08 ENCOUNTER — Other Ambulatory Visit: Payer: Self-pay

## 2023-11-08 DIAGNOSIS — E785 Hyperlipidemia, unspecified: Secondary | ICD-10-CM

## 2023-11-08 DIAGNOSIS — I2510 Atherosclerotic heart disease of native coronary artery without angina pectoris: Secondary | ICD-10-CM

## 2023-11-08 DIAGNOSIS — I714 Abdominal aortic aneurysm, without rupture, unspecified: Secondary | ICD-10-CM

## 2023-11-08 DIAGNOSIS — I7143 Infrarenal abdominal aortic aneurysm, without rupture: Secondary | ICD-10-CM

## 2023-11-08 DIAGNOSIS — I5022 Chronic systolic (congestive) heart failure: Secondary | ICD-10-CM

## 2023-11-08 DIAGNOSIS — I351 Nonrheumatic aortic (valve) insufficiency: Secondary | ICD-10-CM

## 2023-11-08 DIAGNOSIS — Z9889 Other specified postprocedural states: Secondary | ICD-10-CM

## 2023-11-08 MED ORDER — FUROSEMIDE 40 MG PO TABS: 40.0000 mg | ORAL_TABLET | Freq: Every day | ORAL | 3 refills | Status: AC

## 2023-11-08 NOTE — Telephone Encounter (Signed)
Pt's medication resent to pt's pharmacy as requested. Confirmation received.  ?

## 2023-11-08 NOTE — Telephone Encounter (Signed)
*  STAT* If patient is at the pharmacy, call can be transferred to refill team.   1. Which medications need to be refilled? (please list name of each medication and dose if known)  furosemide  (LASIX ) 40 MG tablet   2. Which pharmacy/location (including street and city if local pharmacy) is medication to be sent to? [id number] - [place], [place] - [address]    3. Do they need a 30 day or 90 day supply?  90 day supply + refills    Patient called in because she called the pharmacy this morning and they informed her they didn't have any refills on Fursemide, which doesn't reflect prescription on file. I called the pharmacy; they do not have the prescription from Dr. Raford on file at all--last prescription was from Dr. Varadarajan (10/1). They may have assumed the prescription from [facility] (10/02) was a duplicate and removed it from their system. Pharmacy rep asked that the prescription be sent in again. Please advise.

## 2023-11-09 ENCOUNTER — Encounter: Payer: Self-pay | Admitting: Podiatry

## 2023-11-09 ENCOUNTER — Ambulatory Visit: Admitting: Podiatry

## 2023-11-09 DIAGNOSIS — M79675 Pain in left toe(s): Secondary | ICD-10-CM | POA: Diagnosis not present

## 2023-11-09 DIAGNOSIS — M79674 Pain in right toe(s): Secondary | ICD-10-CM | POA: Diagnosis not present

## 2023-11-09 DIAGNOSIS — B351 Tinea unguium: Secondary | ICD-10-CM

## 2023-11-13 NOTE — Progress Notes (Signed)
  Subjective:  Patient ID: Stephanie Collier, female    DOB: 1935/05/25,  MRN: [id number]  Stephanie Collier presents to clinic today for painful thick toenails that are difficult to trim. Pain interferes with ambulation. Aggravating factors include wearing enclosed shoe gear. Pain is relieved with periodic professional debridement.  Chief Complaint  Patient presents with   RFC     RFC non diabetic toienail trim. LOV with PCP 09/02/23.   New problem(s): None.   PCP is Elliot Charm, MD.  Allergies  Allergen Reactions   Fentanyl  Shortness Of Breath   Acetaminophen  Other (See Comments)    Constipation    Asa [Aspirin ] Other (See Comments)    Per patient stomach cramps   Azithromycin  Diarrhea   Calcium  Carb-Magnesium  Carb Other (See Comments)    skin lesions   Fosamax [Alendronate] Other (See Comments)    indigestion   Labetalol  Hcl Other (See Comments)     indigestion    Review of Systems: Negative except as noted in the HPI.  Objective: No changes noted in today's physical examination. There were no vitals filed for this visit. Stephanie Collier is a pleasant 88 y.o. female in NAD. AAO x 3.  Vascular Examination: Vascular status intact b/l with palpable pedal pulses. CFT immediate b/l. Pedal hair present. No edema. No pain with calf compression b/l. Skin temperature gradient WNL b/l. No varicosities noted. No cyanosis or clubbing noted.  Neurological Examination: Sensation grossly intact b/l with 10 gram monofilament. Vibratory sensation intact b/l.  Dermatological Examination: Pedal skin with normal turgor, texture and tone b/l. No open wounds nor interdigital macerations noted. Toenails 1-5 b/l thick, discolored, elongated with subungual debris and pain on dorsal palpation. No hyperkeratotic lesions noted b/l.   Musculoskeletal Examination: Muscle strength 5/5 to b/l LE.  No pain, crepitus noted b/l.Utilizes rollator for ambulation assistance.  Radiographs:  None  Assessment/Plan: 1. Pain due to onychomycosis of toenails of both feet [B35.1, M79.675, M79.674]   Consent given for treatment. Patient examined. All patient's and/or POA's questions/concerns addressed on today's visit. Mycotic toenails 1-5 b/l debrided in length and girth without incident. Continue soft, supportive shoe gear daily. Report any pedal injuries to medical professional. Call office if there are any quesitons/concerns. -Patient/POA to call should there be question/concern in the interim.   Return in about 3 months (around 02/09/2024).  Delon LITTIE Merlin, DPM      [facility] LOCATION: [address], [place] [address]                   Office [phone]   [facility] LOCATION: [address] [place], [place] [address] Office [phone]

## 2023-11-14 ENCOUNTER — Encounter: Payer: Self-pay | Admitting: Radiology

## 2023-12-19 ENCOUNTER — Ambulatory Visit (HOSPITAL_BASED_OUTPATIENT_CLINIC_OR_DEPARTMENT_OTHER): Admitting: Family

## 2023-12-30 ENCOUNTER — Ambulatory Visit (HOSPITAL_BASED_OUTPATIENT_CLINIC_OR_DEPARTMENT_OTHER): Admitting: Family

## 2023-12-30 ENCOUNTER — Encounter (HOSPITAL_BASED_OUTPATIENT_CLINIC_OR_DEPARTMENT_OTHER): Payer: Self-pay | Admitting: Family

## 2023-12-30 VITALS — BP 122/60 | HR 72 | Ht 60.0 in | Wt 96.5 lb

## 2023-12-30 DIAGNOSIS — I5022 Chronic systolic (congestive) heart failure: Secondary | ICD-10-CM | POA: Diagnosis not present

## 2023-12-30 DIAGNOSIS — E785 Hyperlipidemia, unspecified: Secondary | ICD-10-CM | POA: Diagnosis not present

## 2023-12-30 DIAGNOSIS — I714 Abdominal aortic aneurysm, without rupture, unspecified: Secondary | ICD-10-CM | POA: Diagnosis not present

## 2023-12-30 DIAGNOSIS — Z9889 Other specified postprocedural states: Secondary | ICD-10-CM

## 2023-12-30 DIAGNOSIS — I2510 Atherosclerotic heart disease of native coronary artery without angina pectoris: Secondary | ICD-10-CM | POA: Diagnosis not present

## 2023-12-30 DIAGNOSIS — I351 Nonrheumatic aortic (valve) insufficiency: Secondary | ICD-10-CM

## 2023-12-30 DIAGNOSIS — I25118 Atherosclerotic heart disease of native coronary artery with other forms of angina pectoris: Secondary | ICD-10-CM | POA: Diagnosis not present

## 2023-12-30 MED ORDER — DAPAGLIFLOZIN PROPANEDIOL 10 MG PO TABS: 10.0000 mg | ORAL_TABLET | Freq: Every day | ORAL | 3 refills | Status: AC

## 2023-12-30 NOTE — Patient Instructions (Addendum)
 Medication Instructions:  Continue your current medications.   It is okay to use a lidocaine  patch for your back if needed. Recommend talking with Dr. Jeraline office about pain management.  *If you need a refill on your cardiac medications before your next appointment, please call your pharmacy*  Follow-Up: At [facility], you and your health needs are our priority.  As part of our continuing mission to provide you with exceptional heart care, our providers are all part of one team.  This team includes your primary Cardiologist (physician) and Advanced Practice Providers or APPs (Physician Assistants and Nurse Practitioners) who all work together to provide you with the care you need, when you need it.  Your next appointment:   3-4 months with Dr. Raford  We recommend signing up for the patient portal called MyChart.  Sign up information is provided on this After Visit Summary.  MyChart is used to connect with patients for Virtual Visits (Telemedicine).  Patients are able to view lab/test results, encounter notes, upcoming appointments, etc.  Non-urgent messages can be sent to your provider as well.   To learn more about what you can do with MyChart, go to [email or web address].

## 2023-12-30 NOTE — Progress Notes (Signed)
 " Cardiology Office Note:  .   Date:  12/30/2023  ID:  Stephanie Collier, DOB 01-16-1935, MRN [id number] PCP: Stephanie Charm, MD  [facility] Providers Cardiologist:  Annabella Scarce, MD    History of Present Illness: .   Stephanie Collier is a 88 y.o. female with a hx of chronic systolic and diastolic heart failure (LVEF 30 to 35%), severe aortic regurgitation, mild ascending aortic aneurysm, 4.5 cm infrarenal AAA, thoracic aorta penetrating ulcer, hypertension.   Admitted 1/27-02/12/22 with compression fracture of T5-T7 and underwent cardiac arthroplasty L3 and L4.   Admitted 2/21-2/29/24 with new onset systolic heart failure LVEF 30 to 35% (previous echo 05/2021 LVEF 60%) with mobile echodensity 0.7X 0.7 cm in the right ventricular outflow tract.  Lower extremity duplex negative for DVT.  Patient politely declined cardiac catheterization and elected for less invasive course.  Diuresed with IV Lasix  and discharged on p.o. Lasix .  New onset hypothyroidism and was started on Synthroid .   Seen 03/2022 with intolerance rosuvastatin  was started on pravastatin . She declined palliative care services when they contacted her 03/25/22.   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   Evaluated 09/19/2023 by Dr. Scarce.  She was euvolemic in regard to her HFrEF and severe AI and recommended to continue Farxiga , Lasix , spironolactone  at present dose.  AAA duplex 11/07/23 with stable measurement from prior up to 5.1cm and iliacs up to 2.3. Plan for follow up with [facility] in 6 months.   Presents today for follow up. Weight stable from previous. Endorses compliance with all of her medications. She does on occasion only take a half tablet of Spironolactone  if she feels fatigued.  Requests to not use AI, discussed we are not using AI recording today. Reports no shortness of breath nor dyspnea on exertion. Reports no chest pain, pressure, or tightness. No edema, orthopnea, PND. Reports no palpitations.  Chief concern of back pain and requesting pain patch requesting 'salonpas #5' discussed could use OTC lidocaine  patch and further recommendationsf or her back pain would need to come from PCP or Dr. Georgina of orthopedics.   ROS: Please see the history of present illness.    All other systems reviewed and are negative.   Studies Reviewed: .           Risk Assessment/Calculations:             Physical Exam:   VS:  BP 122/60   Pulse 72   Ht 5' (1.524 m)   Wt 96 lb 8 oz (43.8 kg)   BMI 18.85 kg/m    Wt Readings from Last 3 Encounters:  12/30/23 96 lb 8 oz (43.8 kg)  11/07/23 95 lb 8 oz (43.3 kg)  09/19/23 94 lb (42.6 kg)    Vitals:   12/30/23 1314  BP: 122/60  Pulse: 72  Height: 5' (1.524 m)  Weight: 96 lb 8 oz (43.8 kg)  BMI (Calculated): 18.85    GEN: Well nourished, well developed in no acute distress NECK: No JVD; No carotid bruits CARDIAC: RRR, no rubs, gallops.  Systolic murmur noted. RESPIRATORY:  Clear to auscultation without rales, wheezing or rhonchi  ABDOMEN: Soft, non-tender, non-distended EXTREMITIES:  No edema; No deformity   ASSESSMENT AND PLAN: .    Back pain and hip pain - Prior kyphoplasty  02/10/22 in IR. Prior L intramedullary nail intertrochantric with Dr. Doll 06/04/21. Has since established with orthopedics Dr. Georgina.  Inquires about prescription pain patch, discussed would need to follow up with PCP or orthopedics. Discussed could use OTC lidocaine  patch.   Systolic heart failure- Euvolemic and well compensated on exam.  Low suspicion BP would tolerate ARB/ARNI.   Low sodium diet, fluid restriction <2L, and daily weights encouraged. Educated to contact our office for weight gain of 2 lbs overnight or 5 lbs in one week GDMT Farxiga   10mg daily, Lasix  40mg  daily, Spironolactone  25mg  daily. Previously perceived side effects on Metoprolol  and it was discontinued.   Coronary artery calcification/hyperlipidemia- Stable with no anginal symptoms. No indication for ischemic evaluation.  Prior intolerance rosuvastatin , pravastatin , Zetia .  Has declined additional lipid-lowering therapy.   Severe AR-would recommend avoidance of bradycardia.  She is not an operative candidate. Volume management, as above.    AAA/penetrating aortic ulcer - s/p endovascular repair.  Continue to follow with [facility]. Stable by imaging 10/2023.   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 Resolved by echo 02/2023.  Hypothyroidism -  Continue to follow with PCP.    Dispo: follow up in 3-4 months with Dr. Raford or [facility]  Signed, Reche GORMAN Finder, NP   "

## 2024-03-07 ENCOUNTER — Ambulatory Visit: Admitting: Podiatry

## 2024-03-09 ENCOUNTER — Ambulatory Visit (HOSPITAL_BASED_OUTPATIENT_CLINIC_OR_DEPARTMENT_OTHER): Admitting: Family

## 2024-04-16 ENCOUNTER — Ambulatory Visit (HOSPITAL_COMMUNITY)

## 2024-04-16 ENCOUNTER — Ambulatory Visit: Admitting: Surgery

## 2024-05-02 ENCOUNTER — Ambulatory Visit (HOSPITAL_BASED_OUTPATIENT_CLINIC_OR_DEPARTMENT_OTHER): Admitting: Cardiovascular Disease

## 2024-05-29 ENCOUNTER — Ambulatory Visit (HOSPITAL_BASED_OUTPATIENT_CLINIC_OR_DEPARTMENT_OTHER): Admitting: Cardiovascular Disease
